# Patient Record
Sex: Female | Born: 1955
Health system: Southern US, Community
[De-identification: ages and names within clinical notes are randomized; demographics above are authoritative.]

## PROBLEM LIST (undated history)

## (undated) DIAGNOSIS — F419 Anxiety disorder, unspecified: Secondary | ICD-10-CM

## (undated) DIAGNOSIS — G4733 Obstructive sleep apnea (adult) (pediatric): Secondary | ICD-10-CM

## (undated) DIAGNOSIS — K760 Fatty (change of) liver, not elsewhere classified: Secondary | ICD-10-CM

## (undated) DIAGNOSIS — K219 Gastro-esophageal reflux disease without esophagitis: Secondary | ICD-10-CM

## (undated) DIAGNOSIS — J189 Pneumonia, unspecified organism: Secondary | ICD-10-CM

## (undated) DIAGNOSIS — E119 Type 2 diabetes mellitus without complications: Secondary | ICD-10-CM

## (undated) DIAGNOSIS — E669 Obesity, unspecified: Secondary | ICD-10-CM

## (undated) DIAGNOSIS — K589 Irritable bowel syndrome without diarrhea: Secondary | ICD-10-CM

## (undated) DIAGNOSIS — R0600 Dyspnea, unspecified: Secondary | ICD-10-CM

## (undated) DIAGNOSIS — M1990 Unspecified osteoarthritis, unspecified site: Secondary | ICD-10-CM

## (undated) DIAGNOSIS — I10 Essential (primary) hypertension: Secondary | ICD-10-CM

## (undated) DIAGNOSIS — G43909 Migraine, unspecified, not intractable, without status migrainosus: Secondary | ICD-10-CM

## (undated) HISTORY — PX: TONSILLECTOMY: SUR1361

## (undated) HISTORY — DX: Irritable bowel syndrome, unspecified: K58.9

## (undated) HISTORY — DX: Gastro-esophageal reflux disease without esophagitis: K21.9

## (undated) HISTORY — DX: Obesity, unspecified: E66.9

## (undated) HISTORY — DX: Migraine, unspecified, not intractable, without status migrainosus: G43.909

## (undated) HISTORY — PX: COLONOSCOPY: SHX174

## (undated) HISTORY — DX: Essential (primary) hypertension: I10

## (undated) HISTORY — DX: Type 2 diabetes mellitus without complications: E11.9

## (undated) HISTORY — DX: Obstructive sleep apnea (adult) (pediatric): G47.33

## (undated) HISTORY — DX: Fatty (change of) liver, not elsewhere classified: K76.0

---

## 1997-10-23 ENCOUNTER — Ambulatory Visit (HOSPITAL_COMMUNITY): Admission: RE | Admit: 1997-10-23 | Discharge: 1997-10-23 | Payer: Self-pay | Admitting: Internal Medicine

## 1998-04-18 ENCOUNTER — Emergency Department (HOSPITAL_COMMUNITY): Admission: EM | Admit: 1998-04-18 | Discharge: 1998-04-18 | Payer: Self-pay | Admitting: Emergency Medicine

## 1998-05-15 ENCOUNTER — Other Ambulatory Visit: Admission: RE | Admit: 1998-05-15 | Discharge: 1998-05-15 | Payer: Self-pay | Admitting: Obstetrics and Gynecology

## 1999-07-02 ENCOUNTER — Other Ambulatory Visit: Admission: RE | Admit: 1999-07-02 | Discharge: 1999-07-02 | Payer: Self-pay | Admitting: Obstetrics and Gynecology

## 2000-08-29 ENCOUNTER — Other Ambulatory Visit: Admission: RE | Admit: 2000-08-29 | Discharge: 2000-08-29 | Payer: Self-pay | Admitting: Obstetrics and Gynecology

## 2000-12-27 ENCOUNTER — Encounter: Admission: RE | Admit: 2000-12-27 | Discharge: 2000-12-27 | Payer: Self-pay | Admitting: Obstetrics and Gynecology

## 2000-12-27 ENCOUNTER — Encounter: Payer: Self-pay | Admitting: Obstetrics and Gynecology

## 2001-08-31 ENCOUNTER — Other Ambulatory Visit: Admission: RE | Admit: 2001-08-31 | Discharge: 2001-08-31 | Payer: Self-pay | Admitting: Obstetrics and Gynecology

## 2001-09-12 ENCOUNTER — Encounter: Admission: RE | Admit: 2001-09-12 | Discharge: 2001-09-12 | Payer: Self-pay | Admitting: Obstetrics and Gynecology

## 2001-09-12 ENCOUNTER — Encounter: Payer: Self-pay | Admitting: Obstetrics and Gynecology

## 2002-12-04 ENCOUNTER — Other Ambulatory Visit: Admission: RE | Admit: 2002-12-04 | Discharge: 2002-12-04 | Payer: Self-pay | Admitting: Obstetrics and Gynecology

## 2003-01-25 ENCOUNTER — Encounter: Admission: RE | Admit: 2003-01-25 | Discharge: 2003-01-25 | Payer: Self-pay | Admitting: Obstetrics and Gynecology

## 2003-02-08 ENCOUNTER — Encounter: Admission: RE | Admit: 2003-02-08 | Discharge: 2003-02-08 | Payer: Self-pay | Admitting: Obstetrics and Gynecology

## 2003-12-18 ENCOUNTER — Other Ambulatory Visit: Admission: RE | Admit: 2003-12-18 | Discharge: 2003-12-18 | Payer: Self-pay | Admitting: Obstetrics and Gynecology

## 2004-03-18 ENCOUNTER — Ambulatory Visit (HOSPITAL_COMMUNITY): Admission: RE | Admit: 2004-03-18 | Discharge: 2004-03-18 | Payer: Self-pay | Admitting: Gastroenterology

## 2004-03-18 ENCOUNTER — Encounter (INDEPENDENT_AMBULATORY_CARE_PROVIDER_SITE_OTHER): Payer: Self-pay | Admitting: Specialist

## 2004-04-08 ENCOUNTER — Encounter: Admission: RE | Admit: 2004-04-08 | Discharge: 2004-04-08 | Payer: Self-pay | Admitting: Obstetrics and Gynecology

## 2004-09-02 ENCOUNTER — Other Ambulatory Visit: Admission: RE | Admit: 2004-09-02 | Discharge: 2004-09-02 | Payer: Self-pay | Admitting: Obstetrics and Gynecology

## 2004-12-30 ENCOUNTER — Other Ambulatory Visit: Admission: RE | Admit: 2004-12-30 | Discharge: 2004-12-30 | Payer: Self-pay | Admitting: Obstetrics and Gynecology

## 2005-01-19 ENCOUNTER — Other Ambulatory Visit: Admission: RE | Admit: 2005-01-19 | Discharge: 2005-01-19 | Payer: Self-pay | Admitting: Obstetrics and Gynecology

## 2005-06-01 ENCOUNTER — Encounter: Admission: RE | Admit: 2005-06-01 | Discharge: 2005-06-01 | Payer: Self-pay | Admitting: Obstetrics and Gynecology

## 2005-06-23 ENCOUNTER — Other Ambulatory Visit: Admission: RE | Admit: 2005-06-23 | Discharge: 2005-06-23 | Payer: Self-pay | Admitting: Obstetrics and Gynecology

## 2006-01-06 ENCOUNTER — Other Ambulatory Visit: Admission: RE | Admit: 2006-01-06 | Discharge: 2006-01-06 | Payer: Self-pay | Admitting: Obstetrics and Gynecology

## 2006-06-27 ENCOUNTER — Encounter: Admission: RE | Admit: 2006-06-27 | Discharge: 2006-06-27 | Payer: Self-pay | Admitting: Obstetrics and Gynecology

## 2006-06-27 ENCOUNTER — Other Ambulatory Visit: Admission: RE | Admit: 2006-06-27 | Discharge: 2006-06-27 | Payer: Self-pay | Admitting: Obstetrics and Gynecology

## 2006-07-01 ENCOUNTER — Encounter: Admission: RE | Admit: 2006-07-01 | Discharge: 2006-07-01 | Payer: Self-pay | Admitting: Obstetrics and Gynecology

## 2006-10-19 ENCOUNTER — Encounter: Admission: RE | Admit: 2006-10-19 | Discharge: 2006-10-19 | Payer: Self-pay | Admitting: Obstetrics and Gynecology

## 2007-01-26 ENCOUNTER — Other Ambulatory Visit: Admission: RE | Admit: 2007-01-26 | Discharge: 2007-01-26 | Payer: Self-pay | Admitting: Obstetrics and Gynecology

## 2007-02-07 ENCOUNTER — Encounter: Admission: RE | Admit: 2007-02-07 | Discharge: 2007-02-07 | Payer: Self-pay | Admitting: Obstetrics and Gynecology

## 2007-07-18 ENCOUNTER — Other Ambulatory Visit: Admission: RE | Admit: 2007-07-18 | Discharge: 2007-07-18 | Payer: Self-pay | Admitting: Obstetrics and Gynecology

## 2007-09-25 ENCOUNTER — Encounter: Admission: RE | Admit: 2007-09-25 | Discharge: 2007-09-25 | Payer: Self-pay | Admitting: Obstetrics and Gynecology

## 2008-05-24 ENCOUNTER — Encounter: Admission: RE | Admit: 2008-05-24 | Discharge: 2008-05-24 | Payer: Self-pay | Admitting: Obstetrics and Gynecology

## 2008-08-28 ENCOUNTER — Other Ambulatory Visit: Admission: RE | Admit: 2008-08-28 | Discharge: 2008-08-28 | Payer: Self-pay | Admitting: Obstetrics and Gynecology

## 2008-11-07 ENCOUNTER — Encounter: Admission: RE | Admit: 2008-11-07 | Discharge: 2008-11-07 | Payer: Self-pay | Admitting: Obstetrics and Gynecology

## 2009-03-10 ENCOUNTER — Other Ambulatory Visit: Admission: RE | Admit: 2009-03-10 | Discharge: 2009-03-10 | Payer: Self-pay | Admitting: Obstetrics and Gynecology

## 2009-03-26 ENCOUNTER — Encounter: Admission: RE | Admit: 2009-03-26 | Discharge: 2009-03-26 | Payer: Self-pay | Admitting: Family Medicine

## 2009-09-18 ENCOUNTER — Other Ambulatory Visit: Admission: RE | Admit: 2009-09-18 | Discharge: 2009-09-18 | Payer: Self-pay | Admitting: Obstetrics and Gynecology

## 2009-11-13 ENCOUNTER — Encounter: Admission: RE | Admit: 2009-11-13 | Discharge: 2009-11-13 | Payer: Self-pay | Admitting: Obstetrics and Gynecology

## 2009-12-21 ENCOUNTER — Emergency Department (HOSPITAL_COMMUNITY): Admission: EM | Admit: 2009-12-21 | Discharge: 2009-12-21 | Payer: Self-pay | Admitting: Emergency Medicine

## 2010-03-10 ENCOUNTER — Other Ambulatory Visit
Admission: RE | Admit: 2010-03-10 | Discharge: 2010-03-10 | Payer: Self-pay | Source: Home / Self Care | Admitting: Obstetrics and Gynecology

## 2010-03-10 ENCOUNTER — Other Ambulatory Visit: Payer: Self-pay | Admitting: Nurse Practitioner

## 2010-03-15 ENCOUNTER — Encounter: Payer: Self-pay | Admitting: Obstetrics and Gynecology

## 2010-03-16 ENCOUNTER — Encounter: Payer: Self-pay | Admitting: Obstetrics and Gynecology

## 2010-05-06 LAB — URINALYSIS, ROUTINE W REFLEX MICROSCOPIC
Bilirubin Urine: NEGATIVE
Glucose, UA: NEGATIVE mg/dL
Hgb urine dipstick: NEGATIVE
Specific Gravity, Urine: 1.016 (ref 1.005–1.030)

## 2010-07-10 NOTE — Op Note (Signed)
NAMEHAYDYN, Rhonda Aguilar NO.:  [id number]   MEDICAL RECORD NO.:  [id number]          PATIENT TYPE:  AMB   LOCATION:  ENDO                         FACILITY:  [facility]   PHYSICIAN:  Anselmo Rod, M.D.  DATE OF BIRTH:  Jul 15, 1955   DATE OF PROCEDURE:  03/18/2004  DATE OF DISCHARGE:                                 OPERATIVE REPORT   PROCEDURE:  Colonoscopy with snare polypectomy x1, and cold biopsy x2.   ENDOSCOPIST:  Anselmo Rod, M.D.   INSTRUMENT:  Olympus videocolonoscope.   INDICATIONS FOR PROCEDURE:  A 55 year old white female with a family history  of colon cancer undergoing a screening colonoscopy to rule out colonic  polyps, masses, etcetera.   PREPROCEDURE PREPARATION:  Informed consent was procured from the patient.  The patient fasted for 8 hours prior to the procedure and prepped with a  bottle of magnesium citrate in a gallon of GoLYTELY the night prior to the  procedure.  The risks and benefits of the procedure including a 10% missed  rate was discussed with the patient prior to the procedure.  The patient was  advised to discontinue aspirin 5 days prior to the procedure as well, which  she did.   PREPROCEDURE PHYSICAL:  VITAL SIGNS: The patient with stable vital signs.  NECK:  Supple.  CHEST:  Clear to auscultation.  HEART:  S1, S2 regular.  ABDOMEN:  Soft with normal bowel sounds.   DESCRIPTION OF PROCEDURE:  The patient was placed in the left lateral  decubitus position and sedated with 80 mg of Demerol and 7 mg of Versed in  slow incremental doses.  Once the patient was adequately sedated and  maintained on low-flow oxygen, and continuous cardiac monitoring; the  Olympus videocolonoscope was advanced from the rectum to the cecum and  appendiceal orifice and ileocecal valve were able to be visualized after  multiple washes.  There was a significant amount of residual stool in the  right colon.  A small sessile polyp was snared from 35 cm.   A small erosion  was biopsied from the cecal base, the terminal ileum appeared normal.  There  was no evidence of diverticulosis.  Multiple washes were done throughout the  colon and small lesions could have been missed.  The patient tolerated the  procedure well without any complications.   Retroflexion in the rectum revealed no abnormalities.   IMPRESSION:  1.  Small sessile polyps snared from 35 cm.  2.  Small erosion biopsies from the cecal base.  3.  Normal terminal ileum.  4.  No evidence of diverticulosis.  5.  Some residual stool in the midcolon especially on the right side.  Very      small lesions could have been missed.   RECOMMENDATIONS:  1.  Await pathology results.  2.  Repeat colonoscopy, depending on pathology results.  3.  Avoid all nonsteroidals including aspirin for the next 4 weeks.  4.  Continue on a high-fiber diet with liberal fluid intake.  5.  Outpatient followup as need arises in the future.  JNM/MEDQ  D:  03/18/2004  T:  03/18/2004  Job:  [id number]   cc:   Darius Bump, M.D.  [place] [address]  [place] [address]  Fax: [phone]   Artist Pais, M.D.  [address], [address]  [place]  [place] [address]  Fax: [phone]

## 2010-09-22 ENCOUNTER — Other Ambulatory Visit: Payer: Self-pay | Admitting: Nurse Practitioner

## 2010-09-22 ENCOUNTER — Other Ambulatory Visit (HOSPITAL_COMMUNITY)
Admission: RE | Admit: 2010-09-22 | Discharge: 2010-09-22 | Disposition: A | Discharge status: Home / Self Care | Payer: BC Managed Care – PPO | Source: Ambulatory Visit | Attending: Obstetrics and Gynecology | Admitting: Obstetrics and Gynecology

## 2010-09-22 DIAGNOSIS — Z01419 Encounter for gynecological examination (general) (routine) without abnormal findings: Secondary | ICD-10-CM | POA: Insufficient documentation

## 2010-11-26 ENCOUNTER — Other Ambulatory Visit: Payer: Self-pay | Admitting: Obstetrics and Gynecology

## 2010-11-26 DIAGNOSIS — Z1231 Encounter for screening mammogram for malignant neoplasm of breast: Secondary | ICD-10-CM

## 2010-12-16 ENCOUNTER — Ambulatory Visit
Admission: RE | Admit: 2010-12-16 | Discharge: 2010-12-16 | Disposition: A | Discharge status: Home / Self Care | Payer: BC Managed Care – PPO | Source: Ambulatory Visit | Attending: Obstetrics and Gynecology | Admitting: Obstetrics and Gynecology

## 2010-12-16 DIAGNOSIS — Z1231 Encounter for screening mammogram for malignant neoplasm of breast: Secondary | ICD-10-CM

## 2012-02-11 ENCOUNTER — Other Ambulatory Visit: Payer: Self-pay | Admitting: Obstetrics and Gynecology

## 2012-02-11 DIAGNOSIS — Z1231 Encounter for screening mammogram for malignant neoplasm of breast: Secondary | ICD-10-CM

## 2012-02-23 HISTORY — PX: BREAST BIOPSY: SHX20

## 2012-03-20 ENCOUNTER — Ambulatory Visit: Payer: BC Managed Care – PPO

## 2012-04-18 ENCOUNTER — Other Ambulatory Visit: Payer: BC Managed Care – PPO

## 2012-04-18 ENCOUNTER — Ambulatory Visit
Admission: RE | Admit: 2012-04-18 | Discharge: 2012-04-18 | Disposition: A | Discharge status: Home / Self Care | Payer: 59 | Source: Ambulatory Visit | Attending: Gastroenterology | Admitting: Gastroenterology

## 2012-04-18 ENCOUNTER — Other Ambulatory Visit: Payer: Self-pay | Admitting: Gastroenterology

## 2012-04-18 DIAGNOSIS — R109 Unspecified abdominal pain: Secondary | ICD-10-CM

## 2012-05-15 ENCOUNTER — Ambulatory Visit
Admission: RE | Admit: 2012-05-15 | Discharge: 2012-05-15 | Disposition: A | Discharge status: Home / Self Care | Payer: 59 | Source: Ambulatory Visit | Attending: Obstetrics and Gynecology | Admitting: Obstetrics and Gynecology

## 2012-05-15 DIAGNOSIS — Z1231 Encounter for screening mammogram for malignant neoplasm of breast: Secondary | ICD-10-CM

## 2012-05-17 ENCOUNTER — Other Ambulatory Visit: Payer: Self-pay | Admitting: Obstetrics and Gynecology

## 2012-05-17 DIAGNOSIS — R928 Other abnormal and inconclusive findings on diagnostic imaging of breast: Secondary | ICD-10-CM

## 2012-05-29 ENCOUNTER — Ambulatory Visit
Admission: RE | Admit: 2012-05-29 | Discharge: 2012-05-29 | Disposition: A | Discharge status: Home / Self Care | Payer: 59 | Source: Ambulatory Visit | Attending: Obstetrics and Gynecology | Admitting: Obstetrics and Gynecology

## 2012-05-29 DIAGNOSIS — R928 Other abnormal and inconclusive findings on diagnostic imaging of breast: Secondary | ICD-10-CM

## 2012-08-31 ENCOUNTER — Other Ambulatory Visit (HOSPITAL_COMMUNITY): Payer: Self-pay | Admitting: Family Medicine

## 2012-08-31 ENCOUNTER — Encounter (HOSPITAL_COMMUNITY): Payer: Self-pay

## 2012-08-31 ENCOUNTER — Ambulatory Visit (HOSPITAL_COMMUNITY)
Admission: RE | Admit: 2012-08-31 | Discharge: 2012-08-31 | Disposition: A | Discharge status: Home / Self Care | Payer: 59 | Source: Ambulatory Visit | Attending: Family Medicine | Admitting: Family Medicine

## 2012-08-31 DIAGNOSIS — R0602 Shortness of breath: Secondary | ICD-10-CM

## 2012-08-31 DIAGNOSIS — J984 Other disorders of lung: Secondary | ICD-10-CM | POA: Insufficient documentation

## 2012-08-31 MED ORDER — SODIUM CHLORIDE 0.9 % IV SOLN
INTRAVENOUS | Status: AC
  Filled 2012-08-31: qty 100

## 2012-08-31 MED ORDER — IOHEXOL 350 MG/ML SOLN
100.0000 mL | Freq: Once | INTRAVENOUS | Status: AC | PRN
  Administered 2012-08-31: 100 mL via INTRAVENOUS

## 2012-12-22 ENCOUNTER — Other Ambulatory Visit: Payer: Self-pay | Admitting: Obstetrics and Gynecology

## 2012-12-22 DIAGNOSIS — R921 Mammographic calcification found on diagnostic imaging of breast: Secondary | ICD-10-CM

## 2013-01-11 ENCOUNTER — Ambulatory Visit
Admission: RE | Admit: 2013-01-11 | Discharge: 2013-01-11 | Disposition: A | Discharge status: Home / Self Care | Payer: 59 | Source: Ambulatory Visit | Attending: Obstetrics and Gynecology | Admitting: Obstetrics and Gynecology

## 2013-01-11 ENCOUNTER — Other Ambulatory Visit: Payer: Self-pay | Admitting: Obstetrics and Gynecology

## 2013-01-11 DIAGNOSIS — R921 Mammographic calcification found on diagnostic imaging of breast: Secondary | ICD-10-CM

## 2013-06-13 ENCOUNTER — Other Ambulatory Visit: Payer: Self-pay

## 2013-06-13 DIAGNOSIS — Z1231 Encounter for screening mammogram for malignant neoplasm of breast: Secondary | ICD-10-CM

## 2013-06-21 ENCOUNTER — Ambulatory Visit
Admission: RE | Admit: 2013-06-21 | Discharge: 2013-06-21 | Disposition: A | Discharge status: Home / Self Care | Payer: 59 | Source: Ambulatory Visit

## 2013-06-21 ENCOUNTER — Encounter (INDEPENDENT_AMBULATORY_CARE_PROVIDER_SITE_OTHER): Payer: Self-pay

## 2013-06-21 DIAGNOSIS — Z1231 Encounter for screening mammogram for malignant neoplasm of breast: Secondary | ICD-10-CM

## 2014-04-23 ENCOUNTER — Other Ambulatory Visit (HOSPITAL_COMMUNITY)
Admission: RE | Admit: 2014-04-23 | Discharge: 2014-04-23 | Disposition: A | Discharge status: Home / Self Care | Payer: 59 | Source: Ambulatory Visit | Attending: Nurse Practitioner | Admitting: Nurse Practitioner

## 2014-04-23 ENCOUNTER — Other Ambulatory Visit: Payer: Self-pay | Admitting: Nurse Practitioner

## 2014-04-23 DIAGNOSIS — Z1151 Encounter for screening for human papillomavirus (HPV): Secondary | ICD-10-CM | POA: Insufficient documentation

## 2014-04-23 DIAGNOSIS — Z01419 Encounter for gynecological examination (general) (routine) without abnormal findings: Secondary | ICD-10-CM | POA: Insufficient documentation

## 2014-04-24 LAB — CYTOLOGY - PAP

## 2014-08-20 ENCOUNTER — Other Ambulatory Visit: Payer: Self-pay

## 2014-08-20 DIAGNOSIS — Z1231 Encounter for screening mammogram for malignant neoplasm of breast: Secondary | ICD-10-CM

## 2014-09-05 ENCOUNTER — Telehealth: Payer: Self-pay | Admitting: Cardiology

## 2014-09-05 DIAGNOSIS — R0602 Shortness of breath: Secondary | ICD-10-CM

## 2014-09-05 NOTE — Addendum Note (Signed)
Addended by: Harland German A on: 09/05/2014 04:33 PM   Modules accepted: Orders

## 2014-09-05 NOTE — Telephone Encounter (Signed)
ECHO ordered for scheduling. Patient to review her schedule and let me know when to schedule OV.

## 2014-09-05 NOTE — Telephone Encounter (Signed)
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  Please set her up for 2D echo and then set her up for OV with me.  Please call her to set up a time that is convenient for her.

## 2014-09-10 ENCOUNTER — Ambulatory Visit (HOSPITAL_COMMUNITY): Payer: 59 | Attending: Cardiology

## 2014-09-10 ENCOUNTER — Other Ambulatory Visit: Payer: Self-pay

## 2014-09-10 DIAGNOSIS — I10 Essential (primary) hypertension: Secondary | ICD-10-CM | POA: Diagnosis not present

## 2014-09-10 DIAGNOSIS — Z6841 Body Mass Index (BMI) 40.0 and over, adult: Secondary | ICD-10-CM | POA: Diagnosis not present

## 2014-09-10 DIAGNOSIS — E669 Obesity, unspecified: Secondary | ICD-10-CM | POA: Insufficient documentation

## 2014-09-10 DIAGNOSIS — R0602 Shortness of breath: Secondary | ICD-10-CM | POA: Insufficient documentation

## 2014-09-10 NOTE — Telephone Encounter (Signed)
Patient agrees to OV 8/10 at 1100.

## 2014-09-11 ENCOUNTER — Telehealth: Payer: Self-pay

## 2014-09-11 DIAGNOSIS — R0602 Shortness of breath: Secondary | ICD-10-CM

## 2014-09-11 NOTE — Telephone Encounter (Signed)
-----   Message from Sueanne Margarita, MD sent at 09/11/2014  2:37 PM EDT ----- I have talked with [facility] and I would like to get a cardiopulmonary stress test to determine if SOB is exercised induced asthma, primary cardiac issue with history of diastolic dysfunction or due to deconditioning and obesity. Also we can rule out coronary ischemia.  Please make sure Dr. Haroldine Laws or Dr. Aundra Dubin read this.

## 2014-09-11 NOTE — Addendum Note (Signed)
Addended by: Harland German A on: 09/11/2014 06:22 PM   Modules accepted: Orders

## 2014-09-11 NOTE — Telephone Encounter (Signed)
CPX ordered for scheduling.

## 2014-10-01 ENCOUNTER — Ambulatory Visit: Payer: 59

## 2014-10-02 ENCOUNTER — Ambulatory Visit (HOSPITAL_COMMUNITY): Payer: 59 | Attending: Cardiology

## 2014-10-02 ENCOUNTER — Ambulatory Visit: Payer: 59 | Admitting: Cardiology

## 2014-10-02 DIAGNOSIS — R0602 Shortness of breath: Secondary | ICD-10-CM | POA: Insufficient documentation

## 2014-10-03 ENCOUNTER — Other Ambulatory Visit (HOSPITAL_COMMUNITY): Payer: Self-pay | Admitting: *Deleted

## 2014-10-03 DIAGNOSIS — R0602 Shortness of breath: Secondary | ICD-10-CM

## 2014-10-10 ENCOUNTER — Encounter: Payer: Self-pay | Admitting: Cardiovascular Disease

## 2014-10-15 ENCOUNTER — Ambulatory Visit
Admission: RE | Admit: 2014-10-15 | Discharge: 2014-10-15 | Disposition: A | Discharge status: Home / Self Care | Payer: 59 | Source: Ambulatory Visit

## 2014-10-15 DIAGNOSIS — Z1231 Encounter for screening mammogram for malignant neoplasm of breast: Secondary | ICD-10-CM

## 2014-11-13 ENCOUNTER — Ambulatory Visit: Payer: 59 | Admitting: Cardiology

## 2014-11-20 ENCOUNTER — Telehealth: Payer: Self-pay | Admitting: *Deleted

## 2014-11-20 NOTE — Telephone Encounter (Signed)
PT WAS IN CLINIC AND WAS UNABLE TO PROVIDE INFO AT THAT TIME. PT STATED SHE WILL CALL BACK WITH INFO.

## 2014-11-22 ENCOUNTER — Encounter: Payer: Self-pay | Admitting: Cardiology

## 2014-11-22 ENCOUNTER — Ambulatory Visit (INDEPENDENT_AMBULATORY_CARE_PROVIDER_SITE_OTHER): Payer: 59 | Admitting: Cardiology

## 2014-11-22 VITALS — HR 60 | Ht 62.5 in | Wt 219.4 lb

## 2014-11-22 DIAGNOSIS — G4733 Obstructive sleep apnea (adult) (pediatric): Secondary | ICD-10-CM | POA: Insufficient documentation

## 2014-11-22 DIAGNOSIS — R0600 Dyspnea, unspecified: Secondary | ICD-10-CM | POA: Insufficient documentation

## 2014-11-22 DIAGNOSIS — E669 Obesity, unspecified: Secondary | ICD-10-CM | POA: Diagnosis not present

## 2014-11-22 DIAGNOSIS — R0609 Other forms of dyspnea: Secondary | ICD-10-CM | POA: Diagnosis not present

## 2014-11-22 DIAGNOSIS — I10 Essential (primary) hypertension: Secondary | ICD-10-CM | POA: Diagnosis not present

## 2014-11-22 HISTORY — DX: Obesity, unspecified: E66.9

## 2014-11-22 HISTORY — DX: Essential (primary) hypertension: I10

## 2014-11-22 NOTE — Progress Notes (Signed)
Cardiology Office Note   Date:  11/22/2014   ID:  Rhonda Aguilar, DOB 1955/06/14, MRN [id number]  PCP:  Marjorie Smolder, MD    Chief Complaint  Patient presents with  . New Evaluation    review test results      History of Present Illness: Rhonda Aguilar is a 59 y.o. female who presents for evaluation of DOE.      Past Medical History  Diagnosis Date  . Hypertension   . Migraine     Past Surgical History  Procedure Laterality Date  . Tonsillectomy       Current Outpatient Prescriptions  Medication Sig Dispense Refill  . busPIRone (BUSPAR) 5 MG tablet Take 5 mg by mouth 2 (two) times daily.    [name] dexlansoprazole (DEXILANT) 60 MG capsule Take 60 mg by mouth daily.    . hydrochlorothiazide (MICROZIDE) 12.5 MG capsule Take 12.5 mg by mouth daily. Pt takes Mon, [place] and Fri.  1  . ibuprofen (ADVIL,MOTRIN) 600 MG tablet Take 600 mg by mouth every 6 (six) hours as needed for mild pain.    [name] metoCLOPramide (REGLAN) 10 MG tablet Take 10 mg by mouth as needed for nausea. migraines    . valsartan (DIOVAN) 320 MG tablet Take 320 mg by mouth daily.    . verapamil (VERELAN PM) 240 MG 24 hr capsule Take 240 mg by mouth at bedtime.     No current facility-administered medications for this visit.    Allergies:   Codeine and Penicillins    Social History:  The patient  reports that she has never smoked. She does not have any smokeless tobacco history on file. She reports that she drinks alcohol.   Family History:  The patient's family history includes Coronary artery disease in her father; Diabetes in her mother; Heart disease in her mother; Hypertension in her brother and mother.    ROS:  Please see the history of present illness.   Otherwise, review of systems are positive for none.   All other systems are reviewed and negative.    PHYSICAL EXAM: VS:  Ht 5' 2.5" (1.588 m)  Wt 219 lb 6.4 oz (99.519 kg)  BMI 39.46 kg/m2 , BMI Body mass index is 39.46  kg/(m^2). GEN: Well nourished, well developed, in no acute distress HEENT: normal Neck: no JVD, carotid bruits, or masses Cardiac: RRR; no murmurs, rubs, or gallops,no edema  Respiratory:  clear to auscultation bilaterally, normal work of breathing GI: soft, nontender, nondistended, + BS MS: no deformity or atrophy Skin: warm and dry, no rash Neuro:  Strength and sensation are intact Psych: euthymic mood, full affect   EKG:  EKG is ordered today. The ekg ordered today demonstrates NSR at 60bpm with nonspecific ST abnormality   Recent Labs: No results found for requested labs within last 365 days.    Lipid Panel No results found for: CHOL, TRIG, HDL, CHOLHDL, VLDL, LDLCALC, LDLDIRECT    Wt Readings from Last 3 Encounters:  11/22/14 219 lb 6.4 oz (99.519 kg)        ASSESSMENT AND PLAN:  1.  DOE secondary to morbid obesity and deconditioning.  2D echo showed normal LVF with diastolic dysfunction. Cardiopulmonary xercise testing with gas-exchange demonstrated normal functional capacity when compared to matched sedentary norms. There was not a ventilatory or circulatory limitation. Post-exercise spirometry did not suggest exercise-induced asthma. The ideal-weight adjustment is much  higher than predicted, which suggested the patient's body habitus i smost likely source of her perceived exercise ntolerance.  2.  ? Remote history of PFO by transcranial dopplers but prior 2D echo with saline contrast was negative for PFO.  Transcranial dopplers had been done for migraine HA. 3.  OSA on CPAP but awakening gasping for breath.  She will drop off her SD card to make sure her current CPAP setting is adequate to control her OSA. 4.  Morbid obesity - this is contributing to her DOE as well as fatty liver with elevated LFTs and joint problems.  We had a long discussion about routine exercise program and diet.  She is contemplating gastric sleeve surgery and is going to an information meeting.     5.  Dyslipidemia - recent NMR with normal LDL but elevated small LDL particles.  She will drop off a copy of the labs for my review.  We discussed possible coronary calcium score given her family history of premature CAD for risk stratification in light of her dyslipidemia but she wants to hold off and try diet.  She has had problems with muscle aches on statins in the past.   Current medicines are reviewed at length with the patient today.  The patient does not have concerns regarding medicines.  The following changes have been made:  no change  Labs/ tests ordered today: See above Assessment and Plan No orders of the defined types were placed in this encounter.     Disposition:   FU with me in PRN  Signed, Sueanne Margarita, MD  11/22/2014 8:42 AM    [facility] [place], [place], [place]  [address] Phone: [phone]; Fax: [phone]

## 2014-11-22 NOTE — Patient Instructions (Signed)
Medication Instructions:  Your physician recommends that you continue on your current medications as directed. Please refer to the Current Medication list given to you today.   Labwork: None  Testing/Procedures: None  Follow-Up: Your physician recommends that you schedule a follow-up appointment AS NEEDED with Dr. Turner.  Any Other Special Instructions Will Be Listed Below (If Applicable).   

## 2014-12-19 ENCOUNTER — Other Ambulatory Visit (HOSPITAL_COMMUNITY)
Admission: RE | Admit: 2014-12-19 | Discharge: 2014-12-19 | Disposition: A | Discharge status: Home / Self Care | Payer: 59 | Source: Ambulatory Visit | Attending: Gastroenterology | Admitting: Gastroenterology

## 2014-12-19 DIAGNOSIS — Z029 Encounter for administrative examinations, unspecified: Secondary | ICD-10-CM | POA: Insufficient documentation

## 2014-12-19 LAB — CBC WITH DIFFERENTIAL/PLATELET
Basophils Absolute: 0.1 10*3/uL (ref 0.0–0.1)
Basophils Relative: 0 %
EOS ABS: 0.2 10*3/uL (ref 0.0–0.7)
EOS PCT: 1 %
HCT: 40.9 % (ref 36.0–46.0)
HEMOGLOBIN: 13.7 g/dL (ref 12.0–15.0)
LYMPHS ABS: 4.2 10*3/uL — AB (ref 0.7–4.0)
Lymphocytes Relative: 26 %
MCH: 27.8 pg (ref 26.0–34.0)
MCHC: 33.5 g/dL (ref 30.0–36.0)
MCV: 83.1 fL (ref 78.0–100.0)
MONOS PCT: 9 %
Monocytes Absolute: 1.5 10*3/uL — ABNORMAL HIGH (ref 0.1–1.0)
NEUTROS PCT: 64 %
Neutro Abs: 10.3 10*3/uL — ABNORMAL HIGH (ref 1.7–7.7)
Platelets: 263 10*3/uL (ref 150–400)
RBC: 4.92 MIL/uL (ref 3.87–5.11)
RDW: 15 % (ref 11.5–15.5)
WBC: 16.3 10*3/uL — ABNORMAL HIGH (ref 4.0–10.5)

## 2015-01-21 ENCOUNTER — Encounter: Payer: Self-pay | Admitting: Cardiology

## 2015-02-28 MED FILL — VALSARTAN 320 MG TABLET: 320 | 90 days supply | Qty: 90 | Fill #0

## 2015-03-05 DIAGNOSIS — F339 Major depressive disorder, recurrent, unspecified: Secondary | ICD-10-CM | POA: Diagnosis not present

## 2015-03-20 DIAGNOSIS — Z6841 Body Mass Index (BMI) 40.0 and over, adult: Secondary | ICD-10-CM | POA: Diagnosis not present

## 2015-03-20 DIAGNOSIS — I10 Essential (primary) hypertension: Secondary | ICD-10-CM | POA: Diagnosis not present

## 2015-04-04 MED FILL — PRISTIQ ER 50 MG TABLET: 50 | 30 days supply | Qty: 30 | Fill #0

## 2015-04-17 DIAGNOSIS — F339 Major depressive disorder, recurrent, unspecified: Secondary | ICD-10-CM | POA: Diagnosis not present

## 2015-04-30 MED FILL — DESVENLAFAXINE SUC ER 50 MG: 50 | 90 days supply | Qty: 90 | Fill #0

## 2015-04-30 MED FILL — DEXILANT DR 60 MG CAPSULE: 60 | 90 days supply | Qty: 90 | Fill #1

## 2015-05-16 MED FILL — VERAPAMIL ER 240 MG TABLET: 240 | 90 days supply | Qty: 90 | Fill #0

## 2015-06-10 DIAGNOSIS — F339 Major depressive disorder, recurrent, unspecified: Secondary | ICD-10-CM | POA: Diagnosis not present

## 2015-06-13 ENCOUNTER — Other Ambulatory Visit: Payer: Self-pay

## 2015-06-17 ENCOUNTER — Ambulatory Visit: Payer: 59 | Admitting: Internal Medicine

## 2015-06-19 DIAGNOSIS — I10 Essential (primary) hypertension: Secondary | ICD-10-CM | POA: Diagnosis not present

## 2015-06-19 DIAGNOSIS — R7309 Other abnormal glucose: Secondary | ICD-10-CM | POA: Diagnosis not present

## 2015-06-19 DIAGNOSIS — E669 Obesity, unspecified: Secondary | ICD-10-CM | POA: Diagnosis not present

## 2015-06-19 DIAGNOSIS — E782 Mixed hyperlipidemia: Secondary | ICD-10-CM | POA: Diagnosis not present

## 2015-06-19 MED FILL — VALSARTAN 160 MG TABLET: 160 | 90 days supply | Qty: 90 | Fill #0

## 2015-07-03 MED FILL — busPIRone HCL 10 MG TABS: 10 | 90 days supply | Qty: 90 | Fill #1

## 2015-07-30 DIAGNOSIS — H524 Presbyopia: Secondary | ICD-10-CM | POA: Diagnosis not present

## 2015-07-30 DIAGNOSIS — H5213 Myopia, bilateral: Secondary | ICD-10-CM | POA: Diagnosis not present

## 2015-07-30 MED FILL — DESVENLAFAXINE ER 50 MG TAB: 50 | 90 days supply | Qty: 90 | Fill #0

## 2015-07-30 MED FILL — DEXILANT DR 60 MG CAPSULE: 60 | 90 days supply | Qty: 90 | Fill #2

## 2015-08-18 MED FILL — VERAPAMIL ER 240 MG TABLET: 240 | 90 days supply | Qty: 90 | Fill #0

## 2015-09-10 DIAGNOSIS — F339 Major depressive disorder, recurrent, unspecified: Secondary | ICD-10-CM | POA: Diagnosis not present

## 2015-09-10 MED FILL — DESVENLAFAXINE SUC ER 25 MG: 25 | 26 days supply | Qty: 20 | Fill #0

## 2015-09-17 ENCOUNTER — Telehealth: Payer: Self-pay | Admitting: Neurology

## 2015-09-17 NOTE — Telephone Encounter (Signed)
Patient called to say it had been awhile since she was in to see the doctor but wants to know if she needs to bring her chip in.  She wants a call back tomorrow because she will be off.  [phone]

## 2015-09-17 NOTE — Telephone Encounter (Signed)
I returned pt's call. Pt has not been seen here in at least 3 years and therefore needs a referral back in to be scheduled. Pt should bring her chip with her to her appt.  No answer, left a message asking her to call me back.  If pt calls back, please advise her of this information. She needs a referral to be scheduled at [facility].

## 2015-09-18 DIAGNOSIS — G4733 Obstructive sleep apnea (adult) (pediatric): Secondary | ICD-10-CM | POA: Diagnosis not present

## 2015-10-31 DIAGNOSIS — G4733 Obstructive sleep apnea (adult) (pediatric): Secondary | ICD-10-CM | POA: Diagnosis not present

## 2015-11-05 DIAGNOSIS — I10 Essential (primary) hypertension: Secondary | ICD-10-CM | POA: Diagnosis not present

## 2015-11-05 DIAGNOSIS — M545 Low back pain: Secondary | ICD-10-CM | POA: Diagnosis not present

## 2015-11-06 MED FILL — CYCLOBENZAPRINE 10 MG TAB: 10 | 10 days supply | Qty: 30 | Fill #0

## 2015-11-13 DIAGNOSIS — F339 Major depressive disorder, recurrent, unspecified: Secondary | ICD-10-CM | POA: Diagnosis not present

## 2015-11-13 MED FILL — VERAPAMIL ER 240 MG TABLET: 240 | 90 days supply | Qty: 90 | Fill #0

## 2015-11-13 MED FILL — DESVENLAFAXINE SUC ER 25 MG: 25 | 30 days supply | Qty: 30 | Fill #0

## 2015-11-13 MED FILL — DEXILANT DR 60 MG CAPSULE: 60 | 90 days supply | Qty: 90 | Fill #3

## 2015-11-18 ENCOUNTER — Ambulatory Visit: Payer: 59 | Attending: Family Medicine | Admitting: Physical Therapy

## 2015-11-18 ENCOUNTER — Encounter: Payer: Self-pay | Admitting: Physical Therapy

## 2015-11-18 DIAGNOSIS — M545 Low back pain, unspecified: Secondary | ICD-10-CM

## 2015-11-18 DIAGNOSIS — M6281 Muscle weakness (generalized): Secondary | ICD-10-CM

## 2015-11-18 NOTE — Therapy (Signed)
[place], [place], [address] Phone: [phone]   Fax:  [phone]  Physical Therapy Treatment  Patient Details  Name: Rhonda Aguilar MRN: [id number] Date of Birth: Aug 01, 1955 Referring Provider: Darcus Austin, MD  Encounter Date: 11/18/2015      PT End of Session - 11/18/15 0803    Visit Number 1   Number of Visits 9   Date for PT Re-Evaluation 12/19/15   Authorization Type [facility]   PT Start Time 0802   PT Stop Time 0858   PT Time Calculation (min) 56 min   Activity Tolerance Patient tolerated treatment well   Behavior During Therapy [facility] for tasks assessed/performed      Past Medical History:  Diagnosis Date  . Benign essential HTN 11/22/2014  . Hypertension   . Migraine   . Obesity (BMI 30-39.9) 11/22/2014  . OSA (obstructive sleep apnea)     Past Surgical History:  Procedure Laterality Date  . TONSILLECTOMY      There were no vitals filed for this visit.      Subjective Assessment - 11/18/15 0806    Subjective Began approx Sept 5, Got up to go to work and felt LBP L >R. Felt tight and had spasms while trying to take warm bath. 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 Has been taking flexoril which helped but made her sleepy. Stopped taking because she was feeling better and used heat & ibuprofen. Began feeling pain again yesterday morning and today. 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    How long can you sit comfortably? unlimited   How long can you stand comfortably? 10-15 min   Patient Stated Goals decrease pain, evaluate body mechanics, cat litter 20-25 lb, lift case of water, prevent further problems   Currently in Pain? Yes   Pain Score 2    Pain Location Back   Pain Orientation Lower;Left;Right  L>R   Pain Descriptors / Indicators Aching   Pain Type Chronic pain   Pain Frequency Intermittent   Aggravating Factors  standing/walking   Pain Relieving Factors heat,  medications            [facility] PT Assessment - 11/18/15 0001      Assessment   Medical Diagnosis LBP without sciatica   Referring Provider Darcus Austin, MD   Hand Dominance Right   Next MD Visit Oct 18   Prior Therapy no     Precautions   Precautions None     Restrictions   Weight Bearing Restrictions No     Balance Screen   Has the patient fallen in the past 6 months No     [place] residence   Additional Comments 11 deck steps to enter house     Prior Function   Level of Independence Independent     Cognition   Overall Cognitive Status Within Functional Limits for tasks assessed     Observation/Other Assessments   Focus on Therapeutic Outcomes (FOTO)  61% ability     ROM / Strength   AROM / PROM / Strength AROM;Strength     AROM   AROM Assessment Site Lumbar   Lumbar Flexion 70   Lumbar Extension 10     Strength   Strength Assessment Site Hip   Right/Left Hip Right;Left   Left Hip Extension 4/5   Left Hip ABduction 4-/5     Palpation  Palpation comment TTP QL bilaterally L>R                     [facility] Adult PT Treatment/Exercise - 11/18/15 0001      Exercises   Exercises Lumbar     Lumbar Exercises: Stretches   Single Knee to Chest Stretch 2 reps;10 seconds     Lumbar Exercises: Supine   Ab Set 20 reps     Knee/Hip Exercises: Stretches   Other Knee/Hip Stretches figure 4     Knee/Hip Exercises: Sidelying   Clams x20     Modalities   Modalities Electrical Stimulation;Moist Heat     Moist Heat Therapy   Number Minutes Moist Heat 15 Minutes  concurrent with ESTIM   Moist Heat Location Lumbar Spine     Electrical Stimulation   Electrical Stimulation Location lumbar   Electrical Stimulation Action IFC   Electrical Stimulation Parameters 15' concurrent with heat   Electrical Stimulation Goals Pain                PT Education - 11/18/15 1305    Education provided Yes    Education Details anatomy of condition, POC, HEP, exercise form/rationale   Person(s) Educated Patient   Methods Explanation;Demonstration;Tactile cues;Verbal cues;Handout   Comprehension Verbalized understanding;Returned demonstration;Verbal cues required;Tactile cues required;Need further instruction          PT Short Term Goals - 11/18/15 1309      PT SHORT TERM GOAL #1   Title Pt will be able to carry cat litter without LBP in order to complete household chores by 10/27   Baseline pain at eval   Time 4   Period Weeks   Status New     PT SHORT TERM GOAL #2   Title Pt will demonstrate proper body mechanics while sitting and standing and verbalize ability to recruit appropriate musculature to decrease LBP   Baseline began educating at eval   Time 4   Period Weeks   Status New     PT SHORT TERM GOAL #3   Title Pt will be able to stand for at least 30 minutes before noticing any back pain   Baseline 10 min at eval   Time 4   Period Weeks   Status New     PT SHORT TERM GOAL #4   Title FOTO to 70% ability to indicate significant functional improvement   Baseline 61% ability at eval   Time 4   Period Weeks   Status New                  Plan - 11/18/15 1306    Clinical Impression Statement Pt presents to PT with complaints of LBP that began about a month ago, increases with long term sitting or standing and is improved with movement. 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 Tightness noted in bilateral QL as well as poor coordinated control of abdominal musculature. Pt will benefit from skilled PT in order to decrease muscular spasm and guarding as well as strengthening for lumbo pelvic musculature to provide support to low back.    Rehab Potential Good   PT Frequency 2x / week   PT Duration 4 weeks   PT Treatment/Interventions ADLs/Self Care Home  Management;Cryotherapy;Electrical Stimulation;Iontophoresis 4mg /ml Dexamethasone;Stair training;Gait training;Ultrasound;Traction;Moist Heat;Therapeutic activities;Therapeutic exercise;Balance training;Neuromuscular re-education;Patient/family education;Passive range of motion;Manual techniques;Dry needling;Taping   PT Next Visit  Plan core training, hip abd strengthening, reformer   PT Home Exercise Plan pelvic tilt/abd recruitment in all positions, clams, SKTC, long stretch from sink   Consulted and Agree with Plan of Care Patient      Patient will benefit from skilled therapeutic intervention in order to improve the following deficits and impairments:  Difficulty walking, Increased muscle spasms, Decreased activity tolerance, Pain, Decreased strength  Visit Diagnosis: Bilateral low back pain without sciatica - Plan: PT plan of care cert/re-cert  Muscle weakness (generalized) - Plan: PT plan of care cert/re-cert     Problem List Patient Active Problem List   Diagnosis Date Noted  . DOE (dyspnea on exertion) 11/22/2014  . Obesity (BMI 30-39.9) 11/22/2014  . Benign essential HTN 11/22/2014  . OSA (obstructive sleep apnea)     Juanell Saffo C. Zyanna Leisinger PT, DPT 11/18/15 1:16 PM   [facility] [address] [place], [place], [address] Phone: [phone]   Fax:  [phone]  Name: Rhonda Aguilar MRN: [id number] Date of Birth: 04-23-1955

## 2015-11-26 ENCOUNTER — Ambulatory Visit: Payer: 59 | Attending: Family Medicine | Admitting: Physical Therapy

## 2015-11-26 ENCOUNTER — Encounter: Payer: Self-pay | Admitting: Physical Therapy

## 2015-11-26 DIAGNOSIS — M6281 Muscle weakness (generalized): Secondary | ICD-10-CM | POA: Insufficient documentation

## 2015-11-26 DIAGNOSIS — M545 Low back pain, unspecified: Secondary | ICD-10-CM

## 2015-11-26 DIAGNOSIS — G8929 Other chronic pain: Secondary | ICD-10-CM | POA: Insufficient documentation

## 2015-11-26 DIAGNOSIS — F341 Dysthymic disorder: Secondary | ICD-10-CM | POA: Diagnosis not present

## 2015-11-26 NOTE — Therapy (Signed)
[place], [place], [address] Phone: [phone]   Fax:  [phone]  Physical Therapy Treatment  Patient Details  Name: Rhonda Aguilar MRN: [id number] Date of Birth: 1955-04-29 Referring Provider: Darcus Austin, MD  Encounter Date: 11/26/2015      PT End of Session - 11/26/15 0944    Visit Number 2   Number of Visits 9   Date for PT Re-Evaluation 12/19/15   Authorization Type [facility]   PT Start Time 0940  pt arrived late   PT Stop Time 1025   PT Time Calculation (min) 45 min   Activity Tolerance Patient tolerated treatment well   Behavior During Therapy [facility] for tasks assessed/performed      Past Medical History:  Diagnosis Date  . Benign essential HTN 11/22/2014  . Hypertension   . Migraine   . Obesity (BMI 30-39.9) 11/22/2014  . OSA (obstructive sleep apnea)     Past Surgical History:  Procedure Laterality Date  . TONSILLECTOMY      There were no vitals filed for this visit.      Subjective Assessment - 11/26/15 0942    Subjective R side is feeling better but is now feeling discomfort on L. Flew since last visit and was pulling luggage and did not sleep on a very supportive bath.    Patient Stated Goals decrease pain, evaluate body mechanics, cat litter 20-25 lb, lift case of water, prevent further problems   Currently in Pain? Yes   Pain Score 2    Pain Location Back   Pain Orientation Lower;Right;Left   Pain Descriptors / Indicators Aching   Aggravating Factors  standing still, sitting                         [facility] Adult PT Treatment/Exercise - 11/26/15 0001      Lumbar Exercises: Stretches   Single Knee to Chest Stretch 5 reps;10 seconds  both   Lower Trunk Rotation 5 reps;10 seconds  both     Lumbar Exercises: Standing   Row 20 reps   Theraband Level (Row) Level 2 (Red)   Row Limitations cues for postural control    Other Standing Lumbar Exercises step  reach with pelvic tilt cues     Lumbar Exercises: Supine   AB Set Limitations posterior pelvic tilt- supine, seated, standing   Bent Knee Raise 20 reps  alt LE   Bent Knee Raise Limitations cues for pelvic tilt   Bridge 20 reps   Bridge Limitations with ball squeeze, cues for pelvic tilt   Other Supine Lumbar Exercises hooklying pelvic tilt + GHJ flx ball squeeze     Moist Heat Therapy   Number Minutes Moist Heat 10 Minutes   Moist Heat Location Lumbar Spine                PT Education - 11/26/15 0944    Education provided Yes   Education Details exercise form/rationale   Person(s) Educated Patient   Methods Explanation;Demonstration;Tactile cues;Verbal cues   Comprehension Verbalized understanding;Returned demonstration;Verbal cues required;Tactile cues required;Need further instruction          PT Short Term Goals - 11/18/15 1309      PT SHORT TERM GOAL #1   Title Pt will be able to carry cat litter without LBP in order to complete household chores by 10/27   Baseline pain at eval   Time 4   Period Weeks  Status New     PT SHORT TERM GOAL #2   Title Pt will demonstrate proper body mechanics while sitting and standing and verbalize ability to recruit appropriate musculature to decrease LBP   Baseline began educating at eval   Time 4   Period Weeks   Status New     PT SHORT TERM GOAL #3   Title Pt will be able to stand for at least 30 minutes before noticing any back pain   Baseline 10 min at eval   Time 4   Period Weeks   Status New     PT SHORT TERM GOAL #4   Title FOTO to 70% ability to indicate significant functional improvement   Baseline 61% ability at eval   Time 4   Period Weeks   Status New                  Plan - 11/26/15 1028    Clinical Impression Statement Focus today on activation of core musculature to reduce extension posture. Pt reported feeling like she was falling forward in upright posture due to long term habitual  extension. Pt will continue to benefit from skilled PT for abdominal strengthening and postural control.    PT Next Visit Plan core training, hip abd strengthening, reformer   PT Home Exercise Plan pelvic tilt/abd recruitment in all positions, clams, SKTC, long stretch from sink; rows with pelvic tilt    Consulted and Agree with Plan of Care Patient      Patient will benefit from skilled therapeutic intervention in order to improve the following deficits and impairments:     Visit Diagnosis: Chronic bilateral low back pain without sciatica  Muscle weakness (generalized)     Problem List Patient Active Problem List   Diagnosis Date Noted  . DOE (dyspnea on exertion) 11/22/2014  . Obesity (BMI 30-39.9) 11/22/2014  . Benign essential HTN 11/22/2014  . OSA (obstructive sleep apnea)     Monee Dembeck C. Dian Minahan PT, DPT 11/26/15 10:31 AM   [facility] [address] [place], [place], [address] Phone: [phone]   Fax:  [phone]  Name: Rhonda Aguilar MRN: [id number] Date of Birth: 1955-10-04

## 2015-11-28 ENCOUNTER — Ambulatory Visit: Payer: 59 | Admitting: Physical Therapy

## 2015-11-28 DIAGNOSIS — M6281 Muscle weakness (generalized): Secondary | ICD-10-CM | POA: Diagnosis not present

## 2015-11-28 DIAGNOSIS — G8929 Other chronic pain: Secondary | ICD-10-CM

## 2015-11-28 DIAGNOSIS — M545 Low back pain: Principal | ICD-10-CM

## 2015-11-28 NOTE — Therapy (Signed)
[place], [place], [address] Phone: [phone]   Fax:  [phone]  Physical Therapy Treatment  Patient Details  Name: Rhonda Aguilar MRN: [id number] Date of Birth: 25-Jul-1955 Referring Provider: Darcus Austin, MD  Encounter Date: 11/28/2015      PT End of Session - 11/28/15 1112    Visit Number 3   Number of Visits 9   Date for PT Re-Evaluation 12/19/15   Authorization Type [facility] UMR   PT Start Time 1105   Activity Tolerance Patient tolerated treatment well   Behavior During Therapy [facility] for tasks assessed/performed      Past Medical History:  Diagnosis Date  . Benign essential HTN 11/22/2014  . Hypertension   . Migraine   . Obesity (BMI 30-39.9) 11/22/2014  . OSA (obstructive sleep apnea)     Past Surgical History:  Procedure Laterality Date  . TONSILLECTOMY      There were no vitals filed for this visit.      Subjective Assessment - 11/28/15 1109    Subjective Was tense last night at work, pain today is min, just feels tight.     Currently in Pain? Yes   Pain Score 2    Pain Location Back   Pain Orientation Lower   Pain Descriptors / Indicators Tightness   Pain Type Chronic pain            [facility] Adult PT Treatment/Exercise - 11/28/15 0001      Lumbar Exercises: Machines for Strengthening   Other Lumbar Machine Exercise Pilates Reformer      Pilates Reformer used for LE/core strength, postural strength, lumbopelvic disassociation and core control.  Exercises included: Footwork 2 red 1 blue various positions to warm up and check alignment.  Bridging 2 Red 1 Blue x 10 reps  Supine Arm work 1 Red Arm arcs Parallel and T  Feet in Straps 1 Red 1 yellow Arcs parallel and turnout x 8-10 reps each  Frog Squats 1 Red 1 yellow x 10   single knee to chest each side x 2, 30 sec   MHP 5 min post      PT Short Term Goals - 11/28/15 1115      PT SHORT TERM GOAL #1   Title Pt will be  able to carry cat litter without LBP in order to complete household chores by 10/27   Status On-going     PT SHORT TERM GOAL #2   Title Pt will demonstrate proper body mechanics while sitting and standing and verbalize ability to recruit appropriate musculature to decrease LBP   Status On-going     PT SHORT TERM GOAL #3   Title Pt will be able to stand for at least 30 minutes before noticing any back pain   Status On-going     PT SHORT TERM GOAL #4   Title FOTO to 70% ability to indicate significant functional improvement   Status On-going                  Plan - 11/28/15 1141    Clinical Impression Statement Able to perform Pilates Reformer basics with good awareness of neutral, core activation and no increase in pain.     PT Next Visit Plan core training, hip abd strengthening, reformer   PT Home Exercise Plan pelvic tilt/abd recruitment in all positions, clams, SKTC, long stretch from sink; rows with pelvic tilt    Consulted and Agree with Plan of Care  Patient      Patient will benefit from skilled therapeutic intervention in order to improve the following deficits and impairments:  Difficulty walking, Increased muscle spasms, Decreased activity tolerance, Pain, Decreased strength  Visit Diagnosis: Chronic bilateral low back pain without sciatica  Muscle weakness (generalized)     Problem List Patient Active Problem List   Diagnosis Date Noted  . DOE (dyspnea on exertion) 11/22/2014  . Obesity (BMI 30-39.9) 11/22/2014  . Benign essential HTN 11/22/2014  . OSA (obstructive sleep apnea)     Rhonda Aguilar 11/28/2015, 11:42 AM  [place], [place], [address] Phone: [phone]   Fax:  [phone]  Name: Rhonda Aguilar MRN: [id number] Date of Birth: 31-Aug-1955   Raeford Razor, PT 11/28/15 11:42 AM Phone: [phone] Fax: [phone]

## 2015-12-01 ENCOUNTER — Ambulatory Visit: Payer: 59 | Admitting: Physical Therapy

## 2015-12-01 DIAGNOSIS — M6281 Muscle weakness (generalized): Secondary | ICD-10-CM

## 2015-12-01 DIAGNOSIS — M545 Low back pain, unspecified: Secondary | ICD-10-CM

## 2015-12-01 DIAGNOSIS — G8929 Other chronic pain: Secondary | ICD-10-CM | POA: Diagnosis not present

## 2015-12-01 NOTE — Therapy (Signed)
[place], [place], [address] Phone: [phone]   Fax:  [phone]  Physical Therapy Treatment  Patient Details  Name: Rhonda Aguilar MRN: [id number] Date of Birth: [birth year] Referring Provider: Darcus Austin, MD  Encounter Date: 12/01/2015      PT End of Session - 12/01/15 0953    Visit Number 4   Number of Visits 9   Date for PT Re-Evaluation 12/19/15   Authorization Type [facility] UMR   PT Start Time 0935   PT Stop Time 1030   PT Time Calculation (min) 55 min   Activity Tolerance Patient tolerated treatment well   Behavior During Therapy [facility] for tasks assessed/performed      Past Medical History:  Diagnosis Date  . Benign essential HTN 11/22/2014  . Hypertension   . Migraine   . Obesity (BMI 30-39.9) 11/22/2014  . OSA (obstructive sleep apnea)     Past Surgical History:  Procedure Laterality Date  . TONSILLECTOMY      There were no vitals filed for this visit.      Subjective Assessment - 12/01/15 0936    Subjective Was a little sore in my belly Sat after Friday's session.  Not really hurting now, worked all weekend.    Currently in Pain? No/denies            [facility] Adult PT Treatment/Exercise - 12/01/15 0939      Lumbar Exercises: Supine   AB Set Limitations posterior pelvic tilt- supine, seated, standing   Clam 20 reps   Clam Limitations used ball under pelvis to challenge stability    Heel Slides 20 reps   Bent Knee Raise 20 reps   Bent Knee Raise Limitations used ball under pelvis again    Bridge 10 reps     Moist Heat Therapy   Number Minutes Moist Heat 15 Minutes   Moist Heat Location Lumbar Spine       Pilates Reformer used for LE/core strength, postural strength, lumbopelvic disassociation and core control.  Exercises included: Feet in Straps 1 Red 1 Yellow arcs in neutral, ER and IR.  Quadruped 1 Red UE and LE x 10 each  Mermaid for stretching 1 Red lateral flexion  and added rotation, extension         PT Short Term Goals - 11/28/15 1115      PT SHORT TERM GOAL #1   Title Pt will be able to carry cat litter without LBP in order to complete household chores by 10/27   Status On-going     PT SHORT TERM GOAL #2   Title Pt will demonstrate proper body mechanics while sitting and standing and verbalize ability to recruit appropriate musculature to decrease LBP   Status On-going     PT SHORT TERM GOAL #3   Title Pt will be able to stand for at least 30 minutes before noticing any back pain   Status On-going     PT SHORT TERM GOAL #4   Title FOTO to 70% ability to indicate significant functional improvement   Status On-going                  Plan - 12/01/15 1023    Clinical Impression Statement Established a good HEP for Pre-Pilates core abd.  She has such a busy work schedule and finds it a challenge to set time aside.  She reports trying to integrate good posture and spinal elongation into her daily mobility.  PT Next Visit Plan check prepilates HEP, Cont PIlates on Reformer, chekc goals    PT Home Exercise Plan pelvic tilt/abd recruitment in all positions, clams, SKTC, long stretch from sink; rows with pelvic tilt    Consulted and Agree with Plan of Care Patient      Patient will benefit from skilled therapeutic intervention in order to improve the following deficits and impairments:  Difficulty walking, Increased muscle spasms, Decreased activity tolerance, Pain, Decreased strength  Visit Diagnosis: Chronic bilateral low back pain without sciatica  Muscle weakness (generalized)     Problem List Patient Active Problem List   Diagnosis Date Noted  . DOE (dyspnea on exertion) 11/22/2014  . Obesity (BMI 30-39.9) 11/22/2014  . Benign essential HTN 11/22/2014  . OSA (obstructive sleep apnea)     Tee Richeson 12/01/2015, 10:25 AM  [facility] [address] [place], [place], [address] Phone: [phone]   Fax:  [phone]  Name: Rhonda Aguilar MRN: [id number] Date of Birth: 1955-07-13   Raeford Razor, PT 12/01/15 10:26 AM Phone: [phone] Fax: [phone]

## 2015-12-01 NOTE — Patient Instructions (Signed)
Bracing With Bridging (Hook-Lying)    With neutral spine, tighten pelvic floor and abdominals and hold. Lift bottom. Repeat _10__ times. Do __2_ times a day.   Copyright  VHI. All rights reserved.   Given info on TrA and pelvic floor

## 2015-12-03 ENCOUNTER — Ambulatory Visit: Payer: 59 | Admitting: Physical Therapy

## 2015-12-03 DIAGNOSIS — M545 Low back pain: Principal | ICD-10-CM

## 2015-12-03 DIAGNOSIS — G8929 Other chronic pain: Secondary | ICD-10-CM

## 2015-12-03 DIAGNOSIS — M6281 Muscle weakness (generalized): Secondary | ICD-10-CM | POA: Diagnosis not present

## 2015-12-03 NOTE — Therapy (Signed)
[place], [place], [address] Phone: [phone]   Fax:  [phone]  Physical Therapy Treatment  Patient Details  Name: Rhonda Aguilar MRN: [id number] Date of Birth: November 21, 1955 Referring Provider: Darcus Austin, MD  Encounter Date: 12/03/2015      PT End of Session - 12/03/15 1353    Visit Number 5   Number of Visits 9   Date for PT Re-Evaluation 12/19/15   Authorization Type [facility] UMR   PT Start Time 1300   PT Stop Time 1345   PT Time Calculation (min) 45 min   Activity Tolerance Patient tolerated treatment well   Behavior During Therapy [facility] for tasks assessed/performed      Past Medical History:  Diagnosis Date  . Benign essential HTN 11/22/2014  . Hypertension   . Migraine   . Obesity (BMI 30-39.9) 11/22/2014  . OSA (obstructive sleep apnea)     Past Surgical History:  Procedure Laterality Date  . TONSILLECTOMY      There were no vitals filed for this visit.      Subjective Assessment - 12/03/15 1310    Subjective Patient reports stress may contribute, the stress of getting to PT and work!    Currently in Pain? Yes   Pain Score 4    Pain Location Back   Pain Orientation Right;Lower   Pain Descriptors / Indicators Aching   Pain Type Chronic pain   Pain Onset More than a month ago   Pain Frequency Intermittent   Aggravating Factors  sitting or standing, stress   Pain Relieving Factors heat, stretching, meds           [facility] Adult PT Treatment/Exercise - 12/03/15 1319      Lumbar Exercises: Stretches   Single Knee to Chest Stretch 5 reps;10 seconds  both   Lower Trunk Rotation 5 reps;10 seconds  both   Piriformis Stretch 2 reps;30 seconds   Piriformis Stretch Limitations knee to opp shoulder      Lumbar Exercises: Aerobic   Stationary Bike NuStep L5 UE and LE for 6 min      Lumbar Exercises: Quadruped   Madcat/Old Horse 5 reps   Plank childs pose      Ultrasound   Ultrasound Location Rt QL    Ultrasound Parameters 100%, 1.5 W/cm2 and 1 MHZ   Ultrasound Goals Pain     Manual Therapy   Manual Therapy Soft tissue mobilization;Myofascial release   Manual therapy comments mod to deep pressure with prolonged stretch prior    Soft tissue mobilization lumbar , Rt. QL, very painful and tight    Myofascial Release hip and trunk in sidelying Rt. side up                 PT Education - 12/03/15 1353    Education provided Yes   Education Details QL muscle and awareness of hip level in sitting, stretch to alleviate   Person(s) Educated Patient   Methods Explanation;Demonstration;Tactile cues;Verbal cues   Comprehension Verbalized understanding;Returned demonstration          PT Short Term Goals - 11/28/15 1115      PT SHORT TERM GOAL #1   Title Pt will be able to carry cat litter without LBP in order to complete household chores by 10/27   Status On-going     PT SHORT TERM GOAL #2   Title Pt will demonstrate proper body mechanics while sitting and standing and verbalize ability to  recruit appropriate musculature to decrease LBP   Status On-going     PT SHORT TERM GOAL #3   Title Pt will be able to stand for at least 30 minutes before noticing any back pain   Status On-going     PT SHORT TERM GOAL #4   Title FOTO to 70% ability to indicate significant functional improvement   Status On-going                  Plan - 12/03/15 1354    Clinical Impression Statement Was able to localize level of pain in lumbar spine to Rt. QL, in spasm.  Manual/soft tissue to [name] worked to relieve pain fully.  Pt requesting to come 1 x per week to avoid using FMLA to come to PT appts.  Will drop to this frequency from now on.    PT Next Visit Plan check prepilates HEP, Cont PIlates on Reformer, check goals , assess manual, [name] (QL)    PT Home Exercise Plan pelvic tilt/abd recruitment in all positions, clams, SKTC, long stretch from sink; rows with  pelvic tilt , standing /sidelying QL stretch   Consulted and Agree with Plan of Care Patient      Patient will benefit from skilled therapeutic intervention in order to improve the following deficits and impairments:  Difficulty walking, Increased muscle spasms, Decreased activity tolerance, Pain, Decreased strength  Visit Diagnosis: Chronic bilateral low back pain without sciatica  Muscle weakness (generalized)     Problem List Patient Active Problem List   Diagnosis Date Noted  . DOE (dyspnea on exertion) 11/22/2014  . Obesity (BMI 30-39.9) 11/22/2014  . Benign essential HTN 11/22/2014  . OSA (obstructive sleep apnea)     PAA,JENNIFER 12/03/2015, 1:58 PM  [facility] [address] [place], [place], [address] Phone: [phone]   Fax:  [phone]  Name: Rhonda Aguilar MRN: [id number] Date of Birth: 1955/10/27  Raeford Razor, PT 12/03/15 1:58 PM Phone: [phone] Fax: [phone]

## 2015-12-03 NOTE — Patient Instructions (Signed)
QL stretch position in sidelying with pillow to elongate Also shown alternative position in standing with hip on edge of treatment table, Rt. Foot on the floor ,lateral flexion to L.

## 2015-12-10 ENCOUNTER — Ambulatory Visit: Payer: 59 | Admitting: Physical Therapy

## 2015-12-10 ENCOUNTER — Encounter: Payer: Self-pay | Admitting: Physical Therapy

## 2015-12-10 DIAGNOSIS — M545 Low back pain: Secondary | ICD-10-CM | POA: Diagnosis not present

## 2015-12-10 DIAGNOSIS — G8929 Other chronic pain: Secondary | ICD-10-CM

## 2015-12-10 DIAGNOSIS — E669 Obesity, unspecified: Secondary | ICD-10-CM | POA: Diagnosis not present

## 2015-12-10 DIAGNOSIS — F329 Major depressive disorder, single episode, unspecified: Secondary | ICD-10-CM | POA: Diagnosis not present

## 2015-12-10 DIAGNOSIS — M6281 Muscle weakness (generalized): Secondary | ICD-10-CM | POA: Diagnosis not present

## 2015-12-10 DIAGNOSIS — I10 Essential (primary) hypertension: Secondary | ICD-10-CM | POA: Diagnosis not present

## 2015-12-10 DIAGNOSIS — R7303 Prediabetes: Secondary | ICD-10-CM | POA: Diagnosis not present

## 2015-12-10 MED FILL — DESVENLAFAXINE SUC ER 25 MG: 25 | 30 days supply | Qty: 30 | Fill #1

## 2015-12-10 NOTE — Therapy (Signed)
Rhonda Aguilar, [place], [address] Phone: [phone]   Fax:  [phone]  Physical Therapy Treatment  Patient Details  Name: Rhonda Aguilar MRN: [id number] Date of Birth: 09-05-1955 Referring Provider: Darcus Austin, MD  Encounter Date: 12/10/2015      PT End of Session - 12/10/15 1330    Visit Number 6   Number of Visits 9   Date for PT Re-Evaluation 12/19/15   Authorization Type [facility] UMR   PT Start Time 1331   PT Stop Time 1425   PT Time Calculation (min) 54 min      Past Medical History:  Diagnosis Date  . Benign essential HTN 11/22/2014  . Hypertension   . Migraine   . Obesity (BMI 30-39.9) 11/22/2014  . OSA (obstructive sleep apnea)     Past Surgical History:  Procedure Laterality Date  . TONSILLECTOMY      There were no vitals filed for this visit.      Subjective Assessment - 12/10/15 1331    Subjective pt reports feeling better today. Has been trying to sleep with pillow between knees.    Patient Stated Goals decrease pain, evaluate body mechanics, cat litter 20-25 lb, lift case of water, prevent further problems   Currently in Pain? No/denies                         [facility] Adult PT Treatment/Exercise - 12/10/15 0001      Lumbar Exercises: Stretches   Standing Extension Limitations door pec stretch     Lumbar Exercises: Aerobic   Stationary Bike nu step L 5 5'     Lumbar Exercises: Supine   AB Set Limitations seated on physioball + UE horiz abd red tband   Clam 20 reps   Clam Limitations used ball under pelvis to challenge stability    Bent Knee Raise 20 reps   Bent Knee Raise Limitations used ball under pelvis again    Bridge 20 reps   Bridge Limitations red tband   Other Supine Lumbar Exercises single leg bridges alt R/L     Lumbar Exercises: Quadruped   Madcat/Old Horse 5 reps   Plank childs pose + R/L bias; qped plank     Moist Heat Therapy   Number  Minutes Moist Heat 15 Minutes   Moist Heat Location Lumbar Spine                  PT Short Term Goals - 11/28/15 1115      PT SHORT TERM GOAL #1   Title Pt will be able to carry cat litter without LBP in order to complete household chores by 10/27   Status On-going     PT SHORT TERM GOAL #2   Title Pt will demonstrate proper body mechanics while sitting and standing and verbalize ability to recruit appropriate musculature to decrease LBP   Status On-going     PT SHORT TERM GOAL #3   Title Pt will be able to stand for at least 30 minutes before noticing any back pain   Status On-going     PT SHORT TERM GOAL #4   Title FOTO to 70% ability to indicate significant functional improvement   Status On-going                  Plan - 12/10/15 1340    Clinical Impression Statement pt reports being comfortable with her HEP and knows  when to use her abdominal contraction and stretches to decrease pain. Fatigue notable with exercises today and some cuing required for pelvic tilt during plank.    PT Next Visit Plan review HEP, D/C   PT Home Exercise Plan pelvic tilt/abd recruitment in all positions, clams, SKTC, long stretch from sink; rows with pelvic tilt , standing /sidelying QL stretch   Consulted and Agree with Plan of Care Patient      Patient will benefit from skilled therapeutic intervention in order to improve the following deficits and impairments:     Visit Diagnosis: Chronic bilateral low back pain without sciatica  Muscle weakness (generalized)     Problem List Patient Active Problem List   Diagnosis Date Noted  . DOE (dyspnea on exertion) 11/22/2014  . Obesity (BMI 30-39.9) 11/22/2014  . Benign essential HTN 11/22/2014  . OSA (obstructive sleep apnea)     Rhonda Aguilar C. Rhonda Aguilar PT, DPT 12/10/15 2:29 PM   [place] [facility] [address] [place], [place], [address] Phone: [phone]   Fax:   [phone]  Name: Rhonda Aguilar MRN: [id number] Date of Birth: January 16, 1956

## 2015-12-12 ENCOUNTER — Ambulatory Visit: Payer: 59 | Admitting: Physical Therapy

## 2015-12-16 ENCOUNTER — Other Ambulatory Visit: Payer: Self-pay | Admitting: Obstetrics and Gynecology

## 2015-12-16 ENCOUNTER — Ambulatory Visit: Payer: 59 | Admitting: Physical Therapy

## 2015-12-16 DIAGNOSIS — M545 Low back pain: Secondary | ICD-10-CM | POA: Diagnosis not present

## 2015-12-16 DIAGNOSIS — F341 Dysthymic disorder: Secondary | ICD-10-CM | POA: Diagnosis not present

## 2015-12-16 DIAGNOSIS — M6281 Muscle weakness (generalized): Secondary | ICD-10-CM

## 2015-12-16 DIAGNOSIS — Z1231 Encounter for screening mammogram for malignant neoplasm of breast: Secondary | ICD-10-CM

## 2015-12-16 DIAGNOSIS — G8929 Other chronic pain: Secondary | ICD-10-CM

## 2015-12-16 NOTE — Therapy (Signed)
[place], [place], [address] Phone: [phone]   Fax:  [phone]  Physical Therapy Treatment  Patient Details  Name: Rhonda Aguilar MRN: [id number] Date of Birth: Aug 02, 1955 Referring Provider: Darcus Austin, MD  Encounter Date: 12/16/2015      PT End of Session - 12/16/15 1012    Visit Number 7   Number of Visits 9   Date for PT Re-Evaluation 12/19/15   PT Start Time 0933   PT Stop Time 1022   PT Time Calculation (min) 49 min   Activity Tolerance Patient tolerated treatment well   Behavior During Therapy [facility] for tasks assessed/performed      Past Medical History:  Diagnosis Date  . Benign essential HTN 11/22/2014  . Hypertension   . Migraine   . Obesity (BMI 30-39.9) 11/22/2014  . OSA (obstructive sleep apnea)     Past Surgical History:  Procedure Laterality Date  . TONSILLECTOMY      There were no vitals filed for this visit.      Subjective Assessment - 12/16/15 0942    Subjective No significant pain this AM.  Just tight all over.  Has not done HEP this weekend.  I wish I could be more motivated to exercise.     Currently in Pain? No/denies            [facility] PT Assessment - 12/16/15 1224      Observation/Other Assessments   Focus on Therapeutic Outcomes (FOTO)  80%                     [facility] Adult PT Treatment/Exercise - 12/16/15 0940      Lumbar Exercises: Stretches   Active Hamstring Stretch 2 reps;30 seconds   Single Knee to Chest Stretch 2 reps;30 seconds   Lower Trunk Rotation 5 reps;10 seconds  both     Lumbar Exercises: Supine   Clam 10 reps   Clam Limitations ball under heels    Heel Slides 10 reps   Heel Slides Limitations ball under heels    Bridge 10 reps   Bridge Limitations on ball , 2 sets 1 without arm support    Large Ball Oblique Isometric 5 reps   Large Ball Oblique Isometric Limitations oblique twist with ball      Lumbar Exercises:  Sidelying   Other Sidelying Lumbar Exercises QL stretch and then manual      Moist Heat Therapy   Number Minutes Moist Heat 10 Minutes   Moist Heat Location Lumbar Spine     Manual Therapy   Manual Therapy Soft tissue mobilization;Myofascial release   Manual therapy comments mod to deep pressure with prolonged stretch prior    Soft tissue mobilization lumbar , Rt. QL, very painful and tight    Myofascial Release hip and trunk in sidelying Rt. side up                 PT Education - 12/16/15 1013    Education provided Yes   Education Details HEP review, exercise principles , massage therapist    Person(s) Educated Patient   Methods Explanation;Demonstration   Comprehension Verbalized understanding;Need further instruction;Returned demonstration          PT Short Term Goals - 12/16/15 0955      PT SHORT TERM GOAL #1   Title Pt will be able to carry cat litter without LBP in order to complete household chores by 10/27   Status  Achieved     PT SHORT TERM GOAL #2   Title Pt will demonstrate proper body mechanics while sitting and standing and verbalize ability to recruit appropriate musculature to decrease LBP   Status Achieved     PT SHORT TERM GOAL #3   Title Pt will be able to stand for at least 30 minutes before noticing any back pain   Baseline 15-20 min    Status Partially Met     PT SHORT TERM GOAL #4   Title FOTO to 70% ability to indicate significant functional improvement   Baseline 80%   Status Achieved                  Plan - 12/16/15 1014    Clinical Impression Statement Pt feels ready for DC.  She has noticed a better ability to stand before noticing back pain.  She is getting ready to being a new nutrition program, adding in exercise and plans to join a community Pilates class.     PT Next Visit Plan na   PT Home Exercise Plan pelvic tilt/abd recruitment in all positions, clams, SKTC, long stretch from sink; rows with pelvic tilt ,  standing /sidelying QL stretch   Consulted and Agree with Plan of Care Patient      Patient will benefit from skilled therapeutic intervention in order to improve the following deficits and impairments:  Difficulty walking, Increased muscle spasms, Decreased activity tolerance, Pain, Decreased strength  Visit Diagnosis: Chronic bilateral low back pain without sciatica  Muscle weakness (generalized)     Problem List Patient Active Problem List   Diagnosis Date Noted  . DOE (dyspnea on exertion) 11/22/2014  . Obesity (BMI 30-39.9) 11/22/2014  . Benign essential HTN 11/22/2014  . OSA (obstructive sleep apnea)     Georgio Hattabaugh 12/16/2015, 12:24 PM  [facility] [address] [place], [place], [address] Phone: [phone]   Fax:  [phone]  Name: Rhonda Aguilar MRN: [id number] Date of Birth: [birth year]   PHYSICAL THERAPY DISCHARGE SUMMARY  Visits from Start of Care: 7  Current functional level related to goals / functional outcomes: See above.  She reports knowing what to do for her pain, exercises help her.    Remaining deficits: Min weakness in core, lateral hips.  Hypertonic quadratus lumborum .  Self reports lack of motivation.    Education / Equipment: HEP, posture, Economist, Pilates Plan: Patient agrees to discharge.  Patient goals were partially met. Patient is being discharged due to the patient's request.  ?????     Raeford Razor, PT 12/16/15 12:25 PM Phone: [phone] Fax: [phone]

## 2015-12-18 ENCOUNTER — Encounter: Payer: 59 | Admitting: Physical Therapy

## 2015-12-24 DIAGNOSIS — F339 Major depressive disorder, recurrent, unspecified: Secondary | ICD-10-CM | POA: Diagnosis not present

## 2016-01-06 ENCOUNTER — Ambulatory Visit
Admission: RE | Admit: 2016-01-06 | Discharge: 2016-01-06 | Disposition: A | Discharge status: Home / Self Care | Payer: 59 | Source: Ambulatory Visit | Attending: Obstetrics and Gynecology | Admitting: Obstetrics and Gynecology

## 2016-01-06 DIAGNOSIS — Z1231 Encounter for screening mammogram for malignant neoplasm of breast: Secondary | ICD-10-CM

## 2016-01-13 MED FILL — VALSARTAN 160 MG TABLET: 160 | 90 days supply | Qty: 90 | Fill #1

## 2016-01-13 MED FILL — DESVENLAFAXINE SUC ER 25 MG: 25 | 30 days supply | Qty: 30 | Fill #2

## 2016-01-14 MED FILL — busPIRone HCL 5 MG TABS: 5 | 90 days supply | Qty: 90 | Fill #0

## 2016-01-28 DIAGNOSIS — R635 Abnormal weight gain: Secondary | ICD-10-CM | POA: Diagnosis not present

## 2016-02-05 DIAGNOSIS — G4733 Obstructive sleep apnea (adult) (pediatric): Secondary | ICD-10-CM | POA: Diagnosis not present

## 2016-02-05 DIAGNOSIS — E7849 Other hyperlipidemia: Secondary | ICD-10-CM | POA: Insufficient documentation

## 2016-02-05 DIAGNOSIS — I10 Essential (primary) hypertension: Secondary | ICD-10-CM | POA: Insufficient documentation

## 2016-02-05 DIAGNOSIS — E669 Obesity, unspecified: Secondary | ICD-10-CM | POA: Diagnosis not present

## 2016-02-05 DIAGNOSIS — R635 Abnormal weight gain: Secondary | ICD-10-CM | POA: Diagnosis not present

## 2016-02-05 DIAGNOSIS — K219 Gastro-esophageal reflux disease without esophagitis: Secondary | ICD-10-CM | POA: Diagnosis not present

## 2016-02-05 DIAGNOSIS — E781 Pure hyperglyceridemia: Secondary | ICD-10-CM | POA: Diagnosis not present

## 2016-02-11 MED FILL — DESVENLAFAXINE SUC ER 25 MG: 25 | 90 days supply | Qty: 90 | Fill #0

## 2016-02-11 MED FILL — VERAPAMIL ER 240 MG TABLET: 240 | 90 days supply | Qty: 90 | Fill #1

## 2016-03-01 MED FILL — DEXILANT DR 60 MG CAPSULE: 60 | 30 days supply | Qty: 30 | Fill #0

## 2016-03-16 DIAGNOSIS — E669 Obesity, unspecified: Secondary | ICD-10-CM | POA: Diagnosis not present

## 2016-03-16 DIAGNOSIS — G4733 Obstructive sleep apnea (adult) (pediatric): Secondary | ICD-10-CM | POA: Diagnosis not present

## 2016-03-16 DIAGNOSIS — Z6838 Body mass index (BMI) 38.0-38.9, adult: Secondary | ICD-10-CM | POA: Diagnosis not present

## 2016-03-16 DIAGNOSIS — F339 Major depressive disorder, recurrent, unspecified: Secondary | ICD-10-CM | POA: Diagnosis not present

## 2016-03-16 DIAGNOSIS — I10 Essential (primary) hypertension: Secondary | ICD-10-CM | POA: Diagnosis not present

## 2016-03-16 DIAGNOSIS — R635 Abnormal weight gain: Secondary | ICD-10-CM | POA: Diagnosis not present

## 2016-03-16 DIAGNOSIS — K219 Gastro-esophageal reflux disease without esophagitis: Secondary | ICD-10-CM | POA: Diagnosis not present

## 2016-03-16 DIAGNOSIS — E781 Pure hyperglyceridemia: Secondary | ICD-10-CM | POA: Diagnosis not present

## 2016-03-16 MED FILL — NALTREXONE 50 MG TABLET: 50 | 30 days supply | Qty: 15 | Fill #0

## 2016-03-16 MED FILL — BUPROPION HCL XL 150 MG TAB: 150 | 30 days supply | Qty: 30 | Fill #0

## 2016-04-06 DIAGNOSIS — E669 Obesity, unspecified: Secondary | ICD-10-CM | POA: Diagnosis not present

## 2016-04-06 DIAGNOSIS — E8881 Metabolic syndrome: Secondary | ICD-10-CM | POA: Diagnosis not present

## 2016-04-06 DIAGNOSIS — E781 Pure hyperglyceridemia: Secondary | ICD-10-CM | POA: Diagnosis not present

## 2016-04-06 DIAGNOSIS — I10 Essential (primary) hypertension: Secondary | ICD-10-CM | POA: Diagnosis not present

## 2016-04-06 DIAGNOSIS — R635 Abnormal weight gain: Secondary | ICD-10-CM | POA: Diagnosis not present

## 2016-04-06 DIAGNOSIS — F341 Dysthymic disorder: Secondary | ICD-10-CM | POA: Diagnosis not present

## 2016-04-15 MED FILL — DEXILANT DR 60 MG CAPSULE: 60 | 30 days supply | Qty: 30 | Fill #0

## 2016-04-15 MED FILL — VALSARTAN 320 MG TAB: 320 | 90 days supply | Qty: 90 | Fill #0

## 2016-04-20 DIAGNOSIS — F329 Major depressive disorder, single episode, unspecified: Secondary | ICD-10-CM | POA: Diagnosis not present

## 2016-04-20 DIAGNOSIS — R635 Abnormal weight gain: Secondary | ICD-10-CM | POA: Diagnosis not present

## 2016-04-23 MED FILL — busPIRone HCL 5 MG TABS: 5 | 90 days supply | Qty: 90 | Fill #0

## 2016-05-06 MED FILL — VERAPAMIL ER 240 MG TABLET: 240 | 90 days supply | Qty: 90 | Fill #0

## 2016-05-17 DIAGNOSIS — F339 Major depressive disorder, recurrent, unspecified: Secondary | ICD-10-CM | POA: Diagnosis not present

## 2016-05-17 DIAGNOSIS — F341 Dysthymic disorder: Secondary | ICD-10-CM | POA: Diagnosis not present

## 2016-05-17 MED FILL — SAXENDA 18 MG/3 ML PEN: 18 | 44 days supply | Qty: 15 | Fill #0

## 2016-05-26 DIAGNOSIS — F329 Major depressive disorder, single episode, unspecified: Secondary | ICD-10-CM | POA: Diagnosis not present

## 2016-05-26 DIAGNOSIS — G4733 Obstructive sleep apnea (adult) (pediatric): Secondary | ICD-10-CM | POA: Diagnosis not present

## 2016-05-26 DIAGNOSIS — Z6839 Body mass index (BMI) 39.0-39.9, adult: Secondary | ICD-10-CM | POA: Diagnosis not present

## 2016-05-26 DIAGNOSIS — E669 Obesity, unspecified: Secondary | ICD-10-CM | POA: Diagnosis not present

## 2016-05-26 DIAGNOSIS — E8881 Metabolic syndrome: Secondary | ICD-10-CM | POA: Diagnosis not present

## 2016-05-26 DIAGNOSIS — M25511 Pain in right shoulder: Secondary | ICD-10-CM | POA: Diagnosis not present

## 2016-05-26 DIAGNOSIS — K219 Gastro-esophageal reflux disease without esophagitis: Secondary | ICD-10-CM | POA: Diagnosis not present

## 2016-05-26 DIAGNOSIS — I10 Essential (primary) hypertension: Secondary | ICD-10-CM | POA: Diagnosis not present

## 2016-05-26 DIAGNOSIS — R7303 Prediabetes: Secondary | ICD-10-CM | POA: Diagnosis not present

## 2016-05-31 DIAGNOSIS — M25511 Pain in right shoulder: Secondary | ICD-10-CM | POA: Diagnosis not present

## 2016-05-31 DIAGNOSIS — M542 Cervicalgia: Secondary | ICD-10-CM | POA: Diagnosis not present

## 2016-06-02 MED FILL — predniSONE 10 MG TABS: 10 | 8 days supply | Qty: 20 | Fill #0

## 2016-06-22 DIAGNOSIS — K760 Fatty (change of) liver, not elsewhere classified: Secondary | ICD-10-CM | POA: Diagnosis not present

## 2016-06-22 DIAGNOSIS — R635 Abnormal weight gain: Secondary | ICD-10-CM | POA: Diagnosis not present

## 2016-06-22 DIAGNOSIS — R194 Change in bowel habit: Secondary | ICD-10-CM | POA: Diagnosis not present

## 2016-06-22 DIAGNOSIS — K219 Gastro-esophageal reflux disease without esophagitis: Secondary | ICD-10-CM | POA: Diagnosis not present

## 2016-06-22 MED FILL — DESVENLAFAXINE SUC ER 50 MG: 50 | 90 days supply | Qty: 90 | Fill #0

## 2016-06-22 MED FILL — DEXILANT DR 60 MG CAPSULE: 60 | 30 days supply | Qty: 30 | Fill #0

## 2016-06-30 DIAGNOSIS — G4733 Obstructive sleep apnea (adult) (pediatric): Secondary | ICD-10-CM | POA: Diagnosis not present

## 2016-06-30 DIAGNOSIS — E669 Obesity, unspecified: Secondary | ICD-10-CM | POA: Diagnosis not present

## 2016-06-30 DIAGNOSIS — Z6839 Body mass index (BMI) 39.0-39.9, adult: Secondary | ICD-10-CM | POA: Diagnosis not present

## 2016-06-30 DIAGNOSIS — Z9989 Dependence on other enabling machines and devices: Secondary | ICD-10-CM | POA: Diagnosis not present

## 2016-06-30 DIAGNOSIS — K219 Gastro-esophageal reflux disease without esophagitis: Secondary | ICD-10-CM | POA: Diagnosis not present

## 2016-06-30 DIAGNOSIS — E8881 Metabolic syndrome: Secondary | ICD-10-CM | POA: Diagnosis not present

## 2016-06-30 DIAGNOSIS — I10 Essential (primary) hypertension: Secondary | ICD-10-CM | POA: Diagnosis not present

## 2016-06-30 DIAGNOSIS — F339 Major depressive disorder, recurrent, unspecified: Secondary | ICD-10-CM | POA: Diagnosis not present

## 2016-07-15 MED FILL — VALSARTAN 320 MG TABLET: 320 | 90 days supply | Qty: 90 | Fill #0

## 2016-07-16 MED FILL — SAXENDA 18 MG/3 ML PEN: 18 | 30 days supply | Qty: 15 | Fill #0

## 2016-07-27 MED FILL — DEXILANT DR 60 MG CAPSULE: 60 | 30 days supply | Qty: 30 | Fill #1

## 2016-07-27 MED FILL — busPIRone HCL 5 MG TABS: 5 | 90 days supply | Qty: 90 | Fill #1

## 2016-08-04 DIAGNOSIS — Z713 Dietary counseling and surveillance: Secondary | ICD-10-CM | POA: Diagnosis not present

## 2016-08-04 DIAGNOSIS — E669 Obesity, unspecified: Secondary | ICD-10-CM | POA: Diagnosis not present

## 2016-08-05 MED FILL — VERAPAMIL ER 240 MG TABLET: 240 | 90 days supply | Qty: 90 | Fill #0

## 2016-08-20 MED FILL — SAXENDA 18 MG/3 ML PEN: 18 | 30 days supply | Qty: 15 | Fill #1

## 2016-08-24 DIAGNOSIS — E785 Hyperlipidemia, unspecified: Secondary | ICD-10-CM | POA: Diagnosis not present

## 2016-08-24 DIAGNOSIS — E8881 Metabolic syndrome: Secondary | ICD-10-CM | POA: Diagnosis not present

## 2016-08-26 MED FILL — DEXILANT DR 60 MG CAPSULE: 60 | 30 days supply | Qty: 30 | Fill #2

## 2016-09-01 DIAGNOSIS — C44319 Basal cell carcinoma of skin of other parts of face: Secondary | ICD-10-CM | POA: Diagnosis not present

## 2016-09-01 DIAGNOSIS — L821 Other seborrheic keratosis: Secondary | ICD-10-CM | POA: Diagnosis not present

## 2016-09-01 DIAGNOSIS — D225 Melanocytic nevi of trunk: Secondary | ICD-10-CM | POA: Diagnosis not present

## 2016-09-10 DIAGNOSIS — E781 Pure hyperglyceridemia: Secondary | ICD-10-CM | POA: Diagnosis not present

## 2016-09-10 DIAGNOSIS — F341 Dysthymic disorder: Secondary | ICD-10-CM | POA: Diagnosis not present

## 2016-09-10 DIAGNOSIS — E786 Lipoprotein deficiency: Secondary | ICD-10-CM | POA: Diagnosis not present

## 2016-09-10 DIAGNOSIS — E669 Obesity, unspecified: Secondary | ICD-10-CM | POA: Diagnosis not present

## 2016-09-10 DIAGNOSIS — E8881 Metabolic syndrome: Secondary | ICD-10-CM | POA: Insufficient documentation

## 2016-09-10 DIAGNOSIS — I10 Essential (primary) hypertension: Secondary | ICD-10-CM | POA: Diagnosis not present

## 2016-09-10 DIAGNOSIS — Z6839 Body mass index (BMI) 39.0-39.9, adult: Secondary | ICD-10-CM | POA: Diagnosis not present

## 2016-09-10 MED FILL — valACYclovir HCL 1 GM TABS: 1 | 7 days supply | Qty: 30 | Fill #0

## 2016-09-13 MED FILL — LOSARTAN POTASSIUM 100 MG T: 100 | 30 days supply | Qty: 30 | Fill #0

## 2016-09-23 DIAGNOSIS — F341 Dysthymic disorder: Secondary | ICD-10-CM | POA: Diagnosis not present

## 2016-09-23 MED FILL — DEXILANT DR 60 MG CAPSULE: 60 | 30 days supply | Qty: 30 | Fill #3

## 2016-09-23 MED FILL — DESVENLAFAXINE SUC ER 50 MG: 50 | 90 days supply | Qty: 90 | Fill #0

## 2016-09-27 DIAGNOSIS — G4733 Obstructive sleep apnea (adult) (pediatric): Secondary | ICD-10-CM | POA: Diagnosis not present

## 2016-10-08 DIAGNOSIS — E786 Lipoprotein deficiency: Secondary | ICD-10-CM | POA: Diagnosis not present

## 2016-10-08 DIAGNOSIS — I10 Essential (primary) hypertension: Secondary | ICD-10-CM | POA: Diagnosis not present

## 2016-10-08 DIAGNOSIS — E8881 Metabolic syndrome: Secondary | ICD-10-CM | POA: Diagnosis not present

## 2016-10-08 DIAGNOSIS — E669 Obesity, unspecified: Secondary | ICD-10-CM | POA: Diagnosis not present

## 2016-10-08 DIAGNOSIS — R635 Abnormal weight gain: Secondary | ICD-10-CM | POA: Diagnosis not present

## 2016-10-08 DIAGNOSIS — E781 Pure hyperglyceridemia: Secondary | ICD-10-CM | POA: Diagnosis not present

## 2016-10-08 DIAGNOSIS — Z6839 Body mass index (BMI) 39.0-39.9, adult: Secondary | ICD-10-CM | POA: Diagnosis not present

## 2016-10-13 DIAGNOSIS — Z08 Encounter for follow-up examination after completed treatment for malignant neoplasm: Secondary | ICD-10-CM | POA: Diagnosis not present

## 2016-10-13 DIAGNOSIS — Z85828 Personal history of other malignant neoplasm of skin: Secondary | ICD-10-CM | POA: Diagnosis not present

## 2016-10-13 MED FILL — MUPIROCIN 2% OINTMENT: 2 | 14 days supply | Qty: 22 | Fill #0

## 2016-10-13 MED FILL — LOSARTAN POTASSIUM 100 MG T: 100 | 90 days supply | Qty: 90 | Fill #0

## 2016-10-21 MED FILL — busPIRone HCL 5 MG TABS: 5 | 90 days supply | Qty: 90 | Fill #0

## 2016-11-03 MED FILL — DEXILANT DR 60 MG CAPSULE: 60 | 30 days supply | Qty: 30 | Fill #4

## 2016-11-03 MED FILL — VERAPAMIL ER 240 MG TABLET: 240 | 90 days supply | Qty: 90 | Fill #1

## 2016-11-25 DIAGNOSIS — R7303 Prediabetes: Secondary | ICD-10-CM | POA: Diagnosis not present

## 2016-11-25 DIAGNOSIS — Z6841 Body Mass Index (BMI) 40.0 and over, adult: Secondary | ICD-10-CM | POA: Diagnosis not present

## 2016-11-25 DIAGNOSIS — I10 Essential (primary) hypertension: Secondary | ICD-10-CM | POA: Diagnosis not present

## 2016-11-25 DIAGNOSIS — E782 Mixed hyperlipidemia: Secondary | ICD-10-CM | POA: Diagnosis not present

## 2016-12-10 DIAGNOSIS — F341 Dysthymic disorder: Secondary | ICD-10-CM | POA: Diagnosis not present

## 2016-12-10 MED FILL — DEXILANT DR 60 MG CAPSULE: 60 | 30 days supply | Qty: 30 | Fill #5

## 2016-12-15 DIAGNOSIS — F339 Major depressive disorder, recurrent, unspecified: Secondary | ICD-10-CM | POA: Diagnosis not present

## 2016-12-21 DIAGNOSIS — F341 Dysthymic disorder: Secondary | ICD-10-CM | POA: Diagnosis not present

## 2016-12-28 MED FILL — DESVENLAFAXINE SUC ER 50 MG: 50 | 90 days supply | Qty: 90 | Fill #1

## 2017-01-05 DIAGNOSIS — F341 Dysthymic disorder: Secondary | ICD-10-CM | POA: Diagnosis not present

## 2017-01-11 MED FILL — DEXILANT DR 60 MG CAPSULE: 60 | 30 days supply | Qty: 30 | Fill #6

## 2017-01-12 MED FILL — LOSARTAN POTASSIUM 100 MG T: 100 | 90 days supply | Qty: 90 | Fill #0

## 2017-01-18 DIAGNOSIS — F341 Dysthymic disorder: Secondary | ICD-10-CM | POA: Diagnosis not present

## 2017-01-21 MED FILL — busPIRone HCL 5 MG TABS: 5 | 90 days supply | Qty: 90 | Fill #1

## 2017-02-03 DIAGNOSIS — F341 Dysthymic disorder: Secondary | ICD-10-CM | POA: Diagnosis not present

## 2017-02-09 MED FILL — DEXILANT DR 60 MG CAPSULE: 60 | 30 days supply | Qty: 30 | Fill #7

## 2017-02-10 MED FILL — VERAPAMIL ER 240 MG TABLET: 240 | 90 days supply | Qty: 90 | Fill #0

## 2017-02-26 ENCOUNTER — Ambulatory Visit (INDEPENDENT_AMBULATORY_CARE_PROVIDER_SITE_OTHER): Payer: Self-pay | Admitting: Family

## 2017-02-26 ENCOUNTER — Encounter: Payer: Self-pay | Admitting: Family

## 2017-02-26 VITALS — BP 120/84 | HR 89 | Temp 98.4°F | Wt 227.2 lb

## 2017-02-26 DIAGNOSIS — J019 Acute sinusitis, unspecified: Secondary | ICD-10-CM

## 2017-02-26 DIAGNOSIS — B9689 Other specified bacterial agents as the cause of diseases classified elsewhere: Secondary | ICD-10-CM

## 2017-02-26 DIAGNOSIS — R0982 Postnasal drip: Secondary | ICD-10-CM

## 2017-02-26 MED ORDER — FLUCONAZOLE 150 MG PO TABS: 150.0000 mg | ORAL_TABLET | Freq: Once | ORAL | 0 refills | Status: AC

## 2017-02-26 MED ORDER — DOXYCYCLINE HYCLATE 100 MG PO TABS: 100.0000 mg | ORAL_TABLET | Freq: Two times a day (BID) | ORAL | 0 refills | Status: DC

## 2017-02-26 MED ORDER — PROMETHAZINE-PHENYLEPHRINE 6.25-5 MG/5ML PO SYRP: 5.0000 mL | ORAL_SOLUTION | Freq: Three times a day (TID) | ORAL | 0 refills | Status: DC | PRN

## 2017-02-26 NOTE — Patient Instructions (Signed)

## 2017-02-26 NOTE — Progress Notes (Signed)
Subjective:     Patient ID: Rhonda Aguilar, female   DOB: [birth year], 62 y.o.   MRN: [id number]  HPI Patient is in today with c/o sinus pressure, pain in her teeth, cough, nasal congestion, hoarseness, and post-nasal drip that is worse at night x 1 week. Has been taking mucinex dm without much relief.   Review of Systems  Constitutional: Positive for fatigue. Negative for chills and fever.  HENT: Positive for congestion, facial swelling, postnasal drip, rhinorrhea, sinus pressure and sore throat.   Respiratory: Positive for cough. Negative for shortness of breath and wheezing.   Cardiovascular: Negative.   Gastrointestinal: Negative.   Endocrine: Negative.   Musculoskeletal: Negative.   Allergic/Immunologic: Negative.   Neurological: Negative.   Psychiatric/Behavioral: Negative.    Past Medical History:  Diagnosis Date  . Benign essential HTN 11/22/2014  . Hypertension   . Migraine   . Obesity (BMI 30-39.9) 11/22/2014  . OSA (obstructive sleep apnea)     Social History   Socioeconomic History  . Marital status: Divorced    Spouse name: Not on file  . Number of children: 0  . Years of education: college  . Highest education level: Not on file  Social Needs  . Financial resource strain: Not on file  . Food insecurity - worry: Not on file  . Food insecurity - inability: Not on file  . Transportation needs - medical: Not on file  . Transportation needs - non-medical: Not on file  Occupational History  . Occupation: Engineer, mining  Tobacco Use  . Smoking status: Never Smoker  . Smokeless tobacco: Never Used  Substance and Sexual Activity  . Alcohol use: Yes    Alcohol/week: 0.0 oz  . Drug use: Not on file  . Sexual activity: Not on file  Other Topics Concern  . Not on file  Social History Narrative  . Not on file    Past Surgical History:  Procedure Laterality Date  . TONSILLECTOMY      Family History  Problem Relation Age of Onset  . Diabetes Mother   .  Heart disease Mother   . Hypertension Mother   . Coronary artery disease Father   . Hypertension Brother     Allergies  Allergen Reactions  . Codeine Itching and Rash  . Penicillins Rash    Current Outpatient Medications on File Prior to Visit  Medication Sig Dispense Refill  . busPIRone (BUSPAR) 5 MG tablet Take 5 mg by mouth 2 (two) times daily.    . cyclobenzaprine (FLEXERIL) 10 MG tablet Take 10 mg by mouth 3 (three) times daily as needed for muscle spasms.    [name] dexlansoprazole (DEXILANT) 60 MG capsule Take 60 mg by mouth daily.    . hydrochlorothiazide (MICROZIDE) 12.5 MG capsule Take 12.5 mg by mouth daily. Pt takes Mon, [place] and Fri.  1  . ibuprofen (ADVIL,MOTRIN) 600 MG tablet Take 600 mg by mouth every 6 (six) hours as needed for mild pain.    [name] metoCLOPramide (REGLAN) 10 MG tablet Take 10 mg by mouth as needed for nausea. migraines    . valsartan (DIOVAN) 320 MG tablet Take 160 mg by mouth daily.     . verapamil (VERELAN PM) 240 MG 24 hr capsule Take 240 mg by mouth at bedtime.     No current facility-administered medications on file prior to visit.     BP 120/84   Pulse 89   Temp 98.4 F (36.9 C)   Wt 227 lb  3.2 oz (103.1 kg)   SpO2 96%   BMI 40.89 kg/m chart    Objective:   Physical Exam     Assessment:     Rhonda Aguilar was seen today for *choice* sinus issues.  Diagnoses and all orders for this visit:  Acute bacterial sinusitis  Post-nasal drainage  Other orders -     fluconazole (DIFLUCAN) 150 MG tablet; Take 1 tablet (150 mg total) by mouth once for 1 dose. -     doxycycline (VIBRA-TABS) 100 MG tablet; Take 1 tablet (100 mg total) by mouth 2 (two) times daily. -     promethazine-phenylephrine (PROMETHAZINE-PHENYLEPHRINE) 6.25-5 MG/5ML SYRP; Take 5 mLs by mouth every 8 (eight) hours as needed for congestion.      Plan:     Call the office if symptoms worsen or persist. Recheck as needed

## 2017-03-15 MED FILL — DEXILANT DR 60 MG CAPSULE: 60 | 30 days supply | Qty: 30 | Fill #8

## 2017-03-28 MED FILL — DESVENLAFAXINE SUC ER 50 MG: 50 | 90 days supply | Qty: 90 | Fill #0

## 2017-04-15 MED FILL — DEXILANT DR 60 MG CAPSULE: 60 | 30 days supply | Qty: 30 | Fill #9

## 2017-04-20 MED FILL — busPIRone HCL 5 MG TABS: 5 | 90 days supply | Qty: 90 | Fill #0

## 2017-04-20 MED FILL — LOSARTAN POTASSIUM 100 MG T: 100 | 90 days supply | Qty: 90 | Fill #1

## 2017-04-22 MED FILL — VERAPAMIL ER PM 300 MG CAP: 300 | 30 days supply | Qty: 30 | Fill #0

## 2017-04-26 DIAGNOSIS — F341 Dysthymic disorder: Secondary | ICD-10-CM | POA: Diagnosis not present

## 2017-04-28 DIAGNOSIS — Z6841 Body Mass Index (BMI) 40.0 and over, adult: Secondary | ICD-10-CM | POA: Diagnosis not present

## 2017-04-28 DIAGNOSIS — R21 Rash and other nonspecific skin eruption: Secondary | ICD-10-CM | POA: Diagnosis not present

## 2017-04-28 DIAGNOSIS — I10 Essential (primary) hypertension: Secondary | ICD-10-CM | POA: Diagnosis not present

## 2017-04-28 MED FILL — VALSARTAN 320 MG TAB: 320 | 30 days supply | Qty: 30 | Fill #0

## 2017-04-28 MED FILL — NYSTATIN-TRIAMCINOLONE OINT: 100000-0.1 | 20 days supply | Qty: 60 | Fill #0

## 2017-05-06 DIAGNOSIS — H5213 Myopia, bilateral: Secondary | ICD-10-CM | POA: Diagnosis not present

## 2017-05-19 MED FILL — DEXILANT DR 60 MG CAPSULE: 60 | 30 days supply | Qty: 30 | Fill #10

## 2017-05-23 DIAGNOSIS — E782 Mixed hyperlipidemia: Secondary | ICD-10-CM | POA: Diagnosis not present

## 2017-05-23 DIAGNOSIS — I10 Essential (primary) hypertension: Secondary | ICD-10-CM | POA: Diagnosis not present

## 2017-05-23 DIAGNOSIS — R7303 Prediabetes: Secondary | ICD-10-CM | POA: Diagnosis not present

## 2017-05-24 ENCOUNTER — Other Ambulatory Visit: Payer: Self-pay | Admitting: Family Medicine

## 2017-05-24 DIAGNOSIS — Z1231 Encounter for screening mammogram for malignant neoplasm of breast: Secondary | ICD-10-CM

## 2017-05-25 MED FILL — VERAPAMIL ER PM 300 MG CAP: 300 | 30 days supply | Qty: 30 | Fill #0

## 2017-05-27 MED FILL — VALSARTAN 320 MG TAB: 320 | 30 days supply | Qty: 30 | Fill #1

## 2017-06-01 ENCOUNTER — Ambulatory Visit
Admission: RE | Admit: 2017-06-01 | Discharge: 2017-06-01 | Disposition: A | Discharge status: Home / Self Care | Payer: 59 | Source: Ambulatory Visit | Attending: Family Medicine | Admitting: Family Medicine

## 2017-06-01 DIAGNOSIS — F339 Major depressive disorder, recurrent, unspecified: Secondary | ICD-10-CM | POA: Diagnosis not present

## 2017-06-01 DIAGNOSIS — Z1231 Encounter for screening mammogram for malignant neoplasm of breast: Secondary | ICD-10-CM | POA: Diagnosis not present

## 2017-06-01 MED FILL — DESVENLAFAXINE SUC ER 25 MG: 25 | 90 days supply | Qty: 90 | Fill #0

## 2017-06-15 DIAGNOSIS — F341 Dysthymic disorder: Secondary | ICD-10-CM | POA: Diagnosis not present

## 2017-06-20 MED FILL — DEXILANT DR 60 MG CAPSULE: 60 | 30 days supply | Qty: 30 | Fill #11

## 2017-06-24 MED FILL — VERAPAMIL ER PM 300 MG CAP: 300 | 30 days supply | Qty: 30 | Fill #1

## 2017-07-04 MED FILL — VALSARTAN 320 MG TAB: 320 | 30 days supply | Qty: 30 | Fill #0

## 2017-07-14 ENCOUNTER — Other Ambulatory Visit (HOSPITAL_COMMUNITY)
Admission: RE | Admit: 2017-07-14 | Discharge: 2017-07-14 | Disposition: A | Discharge status: Home / Self Care | Payer: 59 | Source: Ambulatory Visit | Attending: Nurse Practitioner | Admitting: Nurse Practitioner

## 2017-07-14 DIAGNOSIS — Z124 Encounter for screening for malignant neoplasm of cervix: Secondary | ICD-10-CM | POA: Diagnosis not present

## 2017-07-14 DIAGNOSIS — Z01419 Encounter for gynecological examination (general) (routine) without abnormal findings: Secondary | ICD-10-CM | POA: Diagnosis not present

## 2017-07-14 DIAGNOSIS — R102 Pelvic and perineal pain: Secondary | ICD-10-CM | POA: Diagnosis not present

## 2017-07-14 DIAGNOSIS — B372 Candidiasis of skin and nail: Secondary | ICD-10-CM | POA: Diagnosis not present

## 2017-07-15 ENCOUNTER — Other Ambulatory Visit: Payer: Self-pay | Admitting: Nurse Practitioner

## 2017-07-20 LAB — CYTOLOGY - PAP
Diagnosis: NEGATIVE
HPV: NOT DETECTED

## 2017-07-21 ENCOUNTER — Encounter: Payer: 59 | Attending: Family Medicine | Admitting: Registered"

## 2017-07-21 ENCOUNTER — Encounter: Payer: Self-pay | Admitting: Registered"

## 2017-07-21 DIAGNOSIS — F339 Major depressive disorder, recurrent, unspecified: Secondary | ICD-10-CM | POA: Diagnosis not present

## 2017-07-21 DIAGNOSIS — Z713 Dietary counseling and surveillance: Secondary | ICD-10-CM | POA: Insufficient documentation

## 2017-07-21 MED FILL — VERAPAMIL ER PM 300 MG CAP: 300 | 30 days supply | Qty: 30 | Fill #2

## 2017-07-21 MED FILL — DEXILANT DR 60 MG CAPSULE: 60 | 30 days supply | Qty: 30 | Fill #0

## 2017-07-21 MED FILL — busPIRone HCL 5 MG TABS: 5 | 90 days supply | Qty: 90 | Fill #1

## 2017-07-21 NOTE — Patient Instructions (Signed)
Goals: 2 min snack breaks 30 min lunch breaks Meal prepping, start small Get more movement in as you can

## 2017-07-21 NOTE — Progress Notes (Signed)
Appt start time: 1430 end time:  1530.  [employer] Employee Visit 1  Assessment:  Primary concerns today: Patient states she felt better when she was 50 lbs lighter 2 yrs ago after following the [employer]. Pt states she was doing meal prepping, would take 2 min snack breaks and 30 min lunch breaks at work. Pt states the [employer] also cuts out sugar and other strict rules that made her feel socially isolated. While on this plan Pt reports that after work when she would have the desire to stop at a fast-food restaurant she could talk herself out of it, but now isn't able to resist stopping.   Pt states she has not been able to get back into meal prepping. Pt states she enjoys being creative with cooking, but doesn't enjoy cleaning up. Pt states she avoids salty foods because of effect on her blood pressure. Pt states she is also concerned about the increase in her LFTs.  Pt states she has liked sodas since a child, recently has cut back because of GI issues, having reflux today because she didn't take medicine last night. Pt states it feels like the burning she associates with reflux radiates farther into her stomach as well.   Pt sates when she works weekends she usually eats lunch while working. Pt reports MDs shared with her that taking 10 min to eat fast instead of eating while working is better for them, but Pt reports that is not an appealing idea for her.  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 Pt reports sometimes will go down steps, but is limited because breathing is terrible. Pt states her doctor tells her she need to exercise more to improve this. Pt states she doesn't want to exercise, but does report enjoing working in yard. Pt states she hasn't done much due to hot weather but recently had help getting her garden/yard cleaned up so she can maintain it.  A1c 5.9% per patient. Pt states Dr. Inda Merlin at [facility]  checks every 6 months. Sleep: 7-8 hrs (11pm -6 am) sleep apnea, uses nasal pillow Stress: 7-8 out of 10 Energy: "terrible" 4 out of 10; keeps up at work, but comes home tired, doesn't feel like doing anything.  Preferred Learning Style:   No preference indicated   Learning Readiness:   Ready  MEDICATIONS: reviewed   DIETARY INTAKE:  24-hr recall:  B ( AM): Banana, cheese or Premier Protein, banana OR sweet tea OR 2 oz coke (cutting back) OR cottage cheese, fruit Snk ( AM): coffee 10 am OR kashi bar OR [name] cashew cookies  L ( PM): unsweet tea, 1/2 wrap OR sandwich OR when in office sandwich & fruit Snk ( PM): none D ( PM): coke, fresh market 8-10 oz salmon, squash OR broccoli & asparagus steamable. OR shrimp, rice OR lucky 32 salmon, 2 salads OR creamy grits Snk ( PM): goal is fruit OR small ice cream (120 cal)  Beverages: water, unsweet tea, coke, coffee, occasionally wine or burbon  Usual physical activity: 55-65K steps 2-3x week   Estimated energy needs: 1600 calories 180 g carbohydrates 100 g protein 53 g fat  Progress Towards Goal(s):  New goals.   Nutritional Diagnosis:  NI-5.8.5 Inadeqate fiber intake As related to limited vegetable, bean, nut intake.  As evidenced by dietary recall.    Intervention:  Nutrition Education. Discussed mindful eating and self-care.   Goals:  2 min snack breaks 30 min lunch breaks Meal prepping, start small Get more movement in as you can  Teaching Method Utilized:  Visual Auditory  Handouts given during visit include:  Smoothie recipes  Barriers to learning/adherence to lifestyle change: none  Demonstrated degree of understanding via:  Teach Back   Monitoring/Evaluation:  Dietary intake, exercise, and body weight in 4 week(s).

## 2017-07-27 DIAGNOSIS — F341 Dysthymic disorder: Secondary | ICD-10-CM | POA: Diagnosis not present

## 2017-07-27 DIAGNOSIS — Z0000 Encounter for general adult medical examination without abnormal findings: Secondary | ICD-10-CM | POA: Diagnosis not present

## 2017-07-27 DIAGNOSIS — I10 Essential (primary) hypertension: Secondary | ICD-10-CM | POA: Diagnosis not present

## 2017-07-27 DIAGNOSIS — Z6841 Body Mass Index (BMI) 40.0 and over, adult: Secondary | ICD-10-CM | POA: Diagnosis not present

## 2017-08-04 MED FILL — VALSARTAN 320 MG TAB: 320 | 30 days supply | Qty: 30 | Fill #1

## 2017-08-04 MED FILL — DESVENLAFAXINE SUC ER 50 MG: 50 | 90 days supply | Qty: 90 | Fill #0

## 2017-08-09 DIAGNOSIS — K760 Fatty (change of) liver, not elsewhere classified: Secondary | ICD-10-CM | POA: Diagnosis not present

## 2017-08-09 DIAGNOSIS — R635 Abnormal weight gain: Secondary | ICD-10-CM | POA: Diagnosis not present

## 2017-08-09 DIAGNOSIS — K219 Gastro-esophageal reflux disease without esophagitis: Secondary | ICD-10-CM | POA: Diagnosis not present

## 2017-08-22 ENCOUNTER — Encounter: Payer: 59 | Attending: Family Medicine | Admitting: Registered"

## 2017-08-22 DIAGNOSIS — Z713 Dietary counseling and surveillance: Secondary | ICD-10-CM | POA: Diagnosis not present

## 2017-08-22 NOTE — Progress Notes (Signed)
Appt start time: 1010 end time:  1030.  [facility] Employee Visit 2 of 3  Assessment:  Primary concerns today: Patient states she has been on a 5-day conference where she states there was a lot of sitting during the conference, but when sight-seeing in [place] did a lot of walking. Patient states she is getting ready to go on another trip to [place] in August with some friends.   Pt states she has not worked on the goal of meal planning this month due to being out of town and then had company that wanted to eat out a lot. Pt states she ate a lo of seafood and mostly avoided fried foods because she doesn't feel good after eating them. Patient states she has had some ice cream and has asked for kiddi cone, but states it was still probably too much.   Patient states although she has not started a regular exercise routine, she has started stretching. Pt reports that she is considering returning to a yoga studio that was a very good experience in the past and would have to head straight there after work to make it.   From last visit, didn't update today: A1c 5.9% per patient. Pt states Dr. Inda Merlin at [facility] checks every 6 months. Sleep: 7-8 hrs (11pm -6 am) sleep apnea, uses nasal pillow Stress: 7-8 out of 10 Energy: "terrible" 4 out of 10; keeps up at work, but comes home tired, doesn't feel like doing anything.  Preferred Learning Style:   No preference indicated   Learning Readiness:   Ready  MEDICATIONS: reviewed   DIETARY INTAKE:  24-hr recall: (did not assess this visit. Focused on movement.) B ( AM):  Snk ( AM):   L ( PM):  Snk ( PM):  D ( PM):  Snk ( PM):   Beverages:   Usual physical activity: a lot of walking at when sight-seeing, but mostly sitting in conference  Estimated energy needs: 1600 calories 180 g carbohydrates 100 g protein 53 g fat  Progress Towards Goal(s):  New goals.   Nutritional Diagnosis:  NI-5.8.5 Inadeqate fiber intake As related to limited  vegetable, bean, nut intake.  As evidenced by dietary recall.    Intervention:  Nutrition Education. Discussed role of exercise in fatty liver. Discussed the benefits of all different types of movement and value of exploring different options.   Goals: Consider really focusing on getting more movement and listen to your body to know what intensity.  Consider going back to the yoga class that word well for you.  Teaching Method Utilized:  Visual Auditory  Handouts given during visit include:  none  Barriers to learning/adherence to lifestyle change: none  Demonstrated degree of understanding via:  Teach Back   Monitoring/Evaluation:  Dietary intake, exercise, and body weight in 6 week(s).

## 2017-08-22 NOTE — Patient Instructions (Signed)
Consider really focusing on getting more movement and listen to your body to know what intensity.  Consider going back to the yoga class that word well for you.

## 2017-08-26 MED FILL — DEXILANT DR 60 MG CAPSULE: 60 | 30 days supply | Qty: 30 | Fill #0

## 2017-08-26 MED FILL — VERAPAMIL ER PM 300 MG CAP: 300 | 30 days supply | Qty: 30 | Fill #3

## 2017-08-30 DIAGNOSIS — R102 Pelvic and perineal pain: Secondary | ICD-10-CM | POA: Diagnosis not present

## 2017-08-31 DIAGNOSIS — F339 Major depressive disorder, recurrent, unspecified: Secondary | ICD-10-CM | POA: Diagnosis not present

## 2017-09-02 MED FILL — VALSARTAN 320 MG TAB: 320 | 30 days supply | Qty: 30 | Fill #0

## 2017-09-20 DIAGNOSIS — G4733 Obstructive sleep apnea (adult) (pediatric): Secondary | ICD-10-CM | POA: Diagnosis not present

## 2017-09-23 MED FILL — DEXILANT DR 60 MG CAPSULE: 60 | 30 days supply | Qty: 30 | Fill #1

## 2017-09-23 MED FILL — VERAPAMIL ER PM 300 MG CAP: 300 | 30 days supply | Qty: 30 | Fill #4

## 2017-09-26 MED FILL — DESVENLAFAXINE SUCCINATE ER: 25 | 90 days supply | Qty: 90 | Fill #0

## 2017-09-27 MED FILL — DESVENLAFAXINE SUC ER 50 MG: 50 | 90 days supply | Qty: 90 | Fill #0

## 2017-09-27 MED FILL — VALSARTAN 320 MG TAB: 320 | 30 days supply | Qty: 30 | Fill #1

## 2017-10-07 DIAGNOSIS — C44311 Basal cell carcinoma of skin of nose: Secondary | ICD-10-CM | POA: Diagnosis not present

## 2017-10-10 ENCOUNTER — Encounter: Payer: 59 | Attending: Family Medicine | Admitting: Registered"

## 2017-10-10 DIAGNOSIS — Z713 Dietary counseling and surveillance: Secondary | ICD-10-CM

## 2017-10-10 NOTE — Patient Instructions (Addendum)
After your shoulder is feeling better consider yoga. Continue with your plan to get into the gym after work, starting slow is fine. Consider getting out in nature more often. Consider signing up for non-hunger eating class

## 2017-10-10 NOTE — Progress Notes (Signed)
Appt start time: 1110 end time:  1140.  [facility] Employee Visit 3 of 3  Assessment:  Primary concerns today: Patient states she ate well when in [place] and felt really good. Pt states she really enjoyed all the seafood. Pt states she does not like red meat as much anymore. Pt states she enjoys active vacations and states she walked 10-12K steps first part of trip.   Pt states since she has been back from vacation drinking more coke not sure why. Pt states she is going to see if she can find the tea she was drinking in [place] to replace her coke habit.  Preferred Learning Style:   No preference indicated   Learning Readiness:   Ready  MEDICATIONS: reviewed   DIETARY INTAKE:  24-hr recall: (not a standard day of eating, has not been grocery shopping since back from vacation) B ( AM): P&J sandwich OR [employer] cashew cookie, half/half tea Snk ( AM):  Can of Soup  L ( PM): can of soup, tostados with cheese.  Snk ( PM):  D ( PM): [employer] 4 pc fried chicken (not normal dinner), slaw (instead of fries), biscuit, honest sweet tea with honey Snk ( PM):  none Beverages: water, 20 oz coke, tea  Usual physical activity: ADLs x3 days since back from vacation   Estimated energy needs: 1600 calories 180 g carbohydrates 100 g protein 53 g fat  Progress Towards Goal(s):  New goals.   Nutritional Diagnosis:  NI-5.8.5 Inadeqate fiber intake As related to limited vegetable, bean, nut intake.  As evidenced by dietary recall.    Intervention:  Nutrition Education. Discussed role of exercise/stretching in preventing injuries. Discussed ways to change her normal day-to-day routine to help maintain an active lifestyle.  Goals: After your shoulder is feeling better consider yoga. Continue with your plan to get into the gym after work, starting slow is fine. Consider getting out in nature more often. Consider signing up for non-hunger eating class  Teaching Method Utilized:   Visual Auditory  Handouts given during visit include:  none  Barriers to learning/adherence to lifestyle change: none  Demonstrated degree of understanding via:  Teach Back   Monitoring/Evaluation:  Dietary intake, exercise, and body weight prn.

## 2017-10-19 ENCOUNTER — Other Ambulatory Visit: Payer: Self-pay | Admitting: Gastroenterology

## 2017-10-19 DIAGNOSIS — R1031 Right lower quadrant pain: Secondary | ICD-10-CM

## 2017-10-21 MED FILL — VERAPAMIL ER PM 300 MG CAP: 300 | 30 days supply | Qty: 30 | Fill #5

## 2017-10-21 MED FILL — DEXILANT DR 60 MG CAPSULE: 60 | 30 days supply | Qty: 30 | Fill #2

## 2017-10-25 MED FILL — busPIRone HCL 5 MG TABS: 5 | 90 days supply | Qty: 90 | Fill #0

## 2017-10-28 ENCOUNTER — Ambulatory Visit (HOSPITAL_COMMUNITY)
Admission: RE | Admit: 2017-10-28 | Discharge: 2017-10-28 | Disposition: A | Discharge status: Home / Self Care | Payer: 59 | Source: Ambulatory Visit | Attending: Gastroenterology | Admitting: Gastroenterology

## 2017-10-28 ENCOUNTER — Encounter (HOSPITAL_COMMUNITY): Payer: Self-pay

## 2017-10-28 DIAGNOSIS — K760 Fatty (change of) liver, not elsewhere classified: Secondary | ICD-10-CM | POA: Diagnosis not present

## 2017-10-28 DIAGNOSIS — K5730 Diverticulosis of large intestine without perforation or abscess without bleeding: Secondary | ICD-10-CM | POA: Diagnosis not present

## 2017-10-28 DIAGNOSIS — R1031 Right lower quadrant pain: Secondary | ICD-10-CM | POA: Diagnosis not present

## 2017-10-28 DIAGNOSIS — K449 Diaphragmatic hernia without obstruction or gangrene: Secondary | ICD-10-CM | POA: Diagnosis not present

## 2017-10-28 DIAGNOSIS — D259 Leiomyoma of uterus, unspecified: Secondary | ICD-10-CM | POA: Insufficient documentation

## 2017-10-28 MED ORDER — IOPAMIDOL (ISOVUE-300) INJECTION 61%
100.0000 mL | Freq: Once | INTRAVENOUS | Status: AC | PRN
  Administered 2017-10-28: 100 mL via INTRAVENOUS

## 2017-10-28 MED ORDER — IOPAMIDOL (ISOVUE-300) INJECTION 61%
INTRAVENOUS | Status: AC
  Filled 2017-10-28: qty 100

## 2017-10-31 DIAGNOSIS — E782 Mixed hyperlipidemia: Secondary | ICD-10-CM | POA: Diagnosis not present

## 2017-10-31 DIAGNOSIS — Z6841 Body Mass Index (BMI) 40.0 and over, adult: Secondary | ICD-10-CM | POA: Diagnosis not present

## 2017-10-31 DIAGNOSIS — F341 Dysthymic disorder: Secondary | ICD-10-CM | POA: Diagnosis not present

## 2017-10-31 DIAGNOSIS — I10 Essential (primary) hypertension: Secondary | ICD-10-CM | POA: Diagnosis not present

## 2017-10-31 DIAGNOSIS — M2550 Pain in unspecified joint: Secondary | ICD-10-CM | POA: Diagnosis not present

## 2017-10-31 DIAGNOSIS — R7303 Prediabetes: Secondary | ICD-10-CM | POA: Diagnosis not present

## 2017-10-31 LAB — POCT I-STAT CREATININE: Creatinine, Ser: 0.7 mg/dL (ref 0.44–1.00)

## 2017-11-01 MED FILL — VALSARTAN 320 MG TAB: 320 | 30 days supply | Qty: 30 | Fill #2

## 2017-11-04 MED FILL — METFORMIN HCL ER 500 MG TAB: 500 | 30 days supply | Qty: 30 | Fill #0

## 2017-11-10 MED FILL — glipiZIDE ER 5 MG TB24: 5 | 30 days supply | Qty: 30 | Fill #0

## 2017-11-15 DIAGNOSIS — Z85828 Personal history of other malignant neoplasm of skin: Secondary | ICD-10-CM | POA: Diagnosis not present

## 2017-11-15 DIAGNOSIS — Z08 Encounter for follow-up examination after completed treatment for malignant neoplasm: Secondary | ICD-10-CM | POA: Diagnosis not present

## 2017-11-15 MED FILL — FREESTYLE LANCETS: 90 days supply | Qty: 200 | Fill #0

## 2017-11-15 MED FILL — FREESTYLE LITE TEST STRIP: 90 days supply | Qty: 200 | Fill #0

## 2017-11-15 MED FILL — FREESTYLE LITE METER: 20 days supply | Qty: 1 | Fill #0

## 2017-11-23 ENCOUNTER — Ambulatory Visit (INDEPENDENT_AMBULATORY_CARE_PROVIDER_SITE_OTHER): Payer: 59 | Admitting: Physician Assistant

## 2017-11-23 ENCOUNTER — Encounter: Payer: Self-pay | Admitting: Physician Assistant

## 2017-11-23 DIAGNOSIS — F331 Major depressive disorder, recurrent, moderate: Secondary | ICD-10-CM | POA: Diagnosis not present

## 2017-11-23 DIAGNOSIS — F411 Generalized anxiety disorder: Secondary | ICD-10-CM

## 2017-11-23 DIAGNOSIS — G4733 Obstructive sleep apnea (adult) (pediatric): Secondary | ICD-10-CM

## 2017-11-23 MED FILL — VERAPAMIL ER PM 300 MG CAP: 300 | 90 days supply | Qty: 90 | Fill #0

## 2017-11-23 NOTE — Progress Notes (Signed)
[facility]  Patient ID: Rhonda Aguilar,  MRN: [id number]  PCP: Rhonda Austin, MD  Date of Evaluation: 11/23/2017 Time spent:15 minutes   HISTORY/CURRENT STATUS: HPI  She d/c the Pristiq.  Had worsening of abdominal pain, and GI work-up was negative.  She weaned off it, by 25mg  for several weeks at a time and had no withdrawals.  Has had other w/u and found to have DM.  Her A1C went to the low 7s, from 6.something earlier in the year.  She's now doing weight loss management through [facility].   Increased ocular migraines in the past few months.  Has had to get FMLA for it.   Feels more motivated and has a little more energy since going off the Pristiq.  Does cry easily, "but I've decided it's ok to cry when I need to."  She enjoys doing things with friends and family.  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 Anxiety is pretty well controlled. "(I get anxious when there is something to be anxious about"  Sleeps pretty well most of the time.  Using her CPAP with nasal pillow.  Individual Medical History/ Review of Systems: Changes? :Yes DM, Fatty Liver  Allergies: Codeine and Penicillins  Current Medications:  Current Outpatient Medications:  .  busPIRone (BUSPAR) 5 MG tablet, Take 5 mg by mouth 2 (two) times daily., Disp: , Rfl:  .  cyclobenzaprine (FLEXERIL) 10 MG tablet, Take 10 mg by mouth 3 (three) times daily as needed for muscle spasms., Disp: , Rfl:  .  desvenlafaxine (PRISTIQ) 50 MG 24 hr tablet, Take 50 mg by mouth daily., Disp: , Rfl:  .  dexlansoprazole (DEXILANT) 60 MG capsule, Take 60 mg by mouth daily., Disp: , Rfl:  .  doxycycline (VIBRA-TABS) 100 MG tablet, Take 1 tablet (100 mg total) by mouth 2 (two) times daily., Disp: 20 tablet, Rfl: 0 .  hydrochlorothiazide (MICROZIDE) 12.5 MG capsule, Take 12.5 mg by mouth daily. Pt takes Mon, Wed and Fri., Disp: , Rfl: 1 .  ibuprofen  (ADVIL,MOTRIN) 600 MG tablet, Take 600 mg by mouth every 6 (six) hours as needed for mild pain., Disp: , Rfl:  .  metoCLOPramide (REGLAN) 10 MG tablet, Take 10 mg by mouth as needed for nausea. migraines, Disp: , Rfl:  .  promethazine-phenylephrine (PROMETHAZINE-PHENYLEPHRINE) 6.25-5 MG/5ML SYRP, Take 5 mLs by mouth every 8 (eight) hours as needed for congestion., Disp: 118 mL, Rfl: 0 .  valsartan (DIOVAN) 320 MG tablet, Take 160 mg by mouth daily. , Disp: , Rfl:  .  verapamil (VERELAN PM) 240 MG 24 hr capsule, Take 240 mg by mouth at bedtime., Disp: , Rfl:  Medication Side Effects: None  Family Medical/ Social History: Changes? No  MENTAL HEALTH EXAM:  There were no vitals taken for this visit.There is no height or weight on file to calculate BMI.  General Appearance: Well Groomed obese  Eye Contact:  Good  Speech:  Clear and Coherent  Volume:  Normal  Mood:  Euthymic  Affect:  Appropriate  Thought Process:  Goal Directed  Orientation:  Full (Time, Place, and Person)  Thought Content: Logical   Suicidal Thoughts:  No  Homicidal Thoughts:  No  Memory:  Immediate  Judgement:  Good  Insight:  Good  Psychomotor Activity:  Normal  Concentration:  Concentration: Good  Recall:  Good  Fund of Knowledge: Good  Language: Good  Akathisia:  NA  AIMS (if indicated): not done  Assets:  Communication Skills  ADL's:  Intact  Cognition: WNL  Prognosis:  Good    DIAGNOSES:    ICD-10-CM   1. Major depressive disorder, recurrent episode, moderate ([name]) F33.1   2. Generalized anxiety disorder F41.1     RECOMMENDATIONS: Since she is doing well off the Pristiq we will have her stay off it.  Continue the BuSpar 5 mg p.o. daily, as it seems to be effective for the anxiety. Continue therapeutic lifestyle changes.  Continue weight loss program through home health. Return office visit in 1 year or sooner as needed.    Donnal Moat, PA-C

## 2017-11-24 ENCOUNTER — Encounter (INDEPENDENT_AMBULATORY_CARE_PROVIDER_SITE_OTHER): Payer: Self-pay

## 2017-11-24 MED FILL — DEXILANT DR 60 MG CAPSULE: 60 | 30 days supply | Qty: 30 | Fill #3

## 2017-11-30 MED FILL — VALSARTAN 320 MG TAB: 320 | 30 days supply | Qty: 30 | Fill #3

## 2017-12-06 DIAGNOSIS — G4733 Obstructive sleep apnea (adult) (pediatric): Secondary | ICD-10-CM | POA: Diagnosis not present

## 2017-12-08 ENCOUNTER — Encounter (INDEPENDENT_AMBULATORY_CARE_PROVIDER_SITE_OTHER): Payer: Self-pay | Admitting: Bariatrics

## 2017-12-08 ENCOUNTER — Ambulatory Visit (INDEPENDENT_AMBULATORY_CARE_PROVIDER_SITE_OTHER): Payer: 59 | Admitting: Bariatrics

## 2017-12-08 VITALS — BP 136/83 | HR 114 | Temp 97.9°F | Ht 62.0 in | Wt 231.0 lb

## 2017-12-08 DIAGNOSIS — Z1331 Encounter for screening for depression: Secondary | ICD-10-CM

## 2017-12-08 DIAGNOSIS — G43109 Migraine with aura, not intractable, without status migrainosus: Secondary | ICD-10-CM | POA: Diagnosis not present

## 2017-12-08 DIAGNOSIS — G4733 Obstructive sleep apnea (adult) (pediatric): Secondary | ICD-10-CM | POA: Diagnosis not present

## 2017-12-08 DIAGNOSIS — R0602 Shortness of breath: Secondary | ICD-10-CM

## 2017-12-08 DIAGNOSIS — Z6841 Body Mass Index (BMI) 40.0 and over, adult: Secondary | ICD-10-CM

## 2017-12-08 DIAGNOSIS — I10 Essential (primary) hypertension: Secondary | ICD-10-CM | POA: Diagnosis not present

## 2017-12-08 DIAGNOSIS — Z9189 Other specified personal risk factors, not elsewhere classified: Secondary | ICD-10-CM

## 2017-12-08 DIAGNOSIS — E66813 Obesity, class 3: Secondary | ICD-10-CM

## 2017-12-08 DIAGNOSIS — R5383 Other fatigue: Secondary | ICD-10-CM

## 2017-12-08 DIAGNOSIS — Z9989 Dependence on other enabling machines and devices: Secondary | ICD-10-CM | POA: Insufficient documentation

## 2017-12-08 DIAGNOSIS — E119 Type 2 diabetes mellitus without complications: Secondary | ICD-10-CM

## 2017-12-08 DIAGNOSIS — Z0289 Encounter for other administrative examinations: Secondary | ICD-10-CM

## 2017-12-09 LAB — COMPREHENSIVE METABOLIC PANEL
A/G RATIO: 1.9 (ref 1.2–2.2)
ALK PHOS: 86 IU/L (ref 39–117)
ALT: 118 IU/L — ABNORMAL HIGH (ref 0–32)
AST: 90 IU/L — AB (ref 0–40)
Albumin: 4.4 g/dL (ref 3.6–4.8)
BILIRUBIN TOTAL: 0.5 mg/dL (ref 0.0–1.2)
BUN/Creatinine Ratio: 23 (ref 12–28)
BUN: 17 mg/dL (ref 8–27)
CHLORIDE: 101 mmol/L (ref 96–106)
CO2: 18 mmol/L — ABNORMAL LOW (ref 20–29)
Calcium: 9.2 mg/dL (ref 8.7–10.3)
Creatinine, Ser: 0.73 mg/dL (ref 0.57–1.00)
GFR calc Af Amer: 101 mL/min/{1.73_m2} (ref >59)
GFR calc non Af Amer: 87 mL/min/{1.73_m2} (ref >59)
Globulin, Total: 2.3 g/dL (ref 1.5–4.5)
Glucose: 122 mg/dL — ABNORMAL HIGH (ref 65–99)
POTASSIUM: 4.1 mmol/L (ref 3.5–5.2)
Sodium: 138 mmol/L (ref 134–144)
Total Protein: 6.7 g/dL (ref 6.0–8.5)

## 2017-12-09 LAB — CBC WITH DIFFERENTIAL
BASOS ABS: 0 10*3/uL (ref 0.0–0.2)
Basos: 1 %
EOS (ABSOLUTE): 0.2 10*3/uL (ref 0.0–0.4)
Eos: 2 %
Hematocrit: 38.1 % (ref 34.0–46.6)
Hemoglobin: 12.9 g/dL (ref 11.1–15.9)
Immature Grans (Abs): 0 10*3/uL (ref 0.0–0.1)
Immature Granulocytes: 1 %
LYMPHS: 29 %
Lymphocytes Absolute: 2.5 10*3/uL (ref 0.7–3.1)
MCH: 27.8 pg (ref 26.6–33.0)
MCHC: 33.9 g/dL (ref 31.5–35.7)
MCV: 82 fL (ref 79–97)
MONOCYTES: 6 %
Monocytes Absolute: 0.5 10*3/uL (ref 0.1–0.9)
NEUTROS PCT: 61 %
Neutrophils Absolute: 5.2 10*3/uL (ref 1.4–7.0)
RBC: 4.64 x10E6/uL (ref 3.77–5.28)
RDW: 14 % (ref 12.3–15.4)
WBC: 8.5 10*3/uL (ref 3.4–10.8)

## 2017-12-09 LAB — LIPID PANEL WITH LDL/HDL RATIO
Cholesterol, Total: 197 mg/dL (ref 100–199)
HDL: 42 mg/dL (ref >39)
LDL Calculated: 125 mg/dL — ABNORMAL HIGH (ref 0–99)
LDl/HDL Ratio: 3 ratio (ref 0.0–3.2)
TRIGLYCERIDES: 148 mg/dL (ref 0–149)
VLDL Cholesterol Cal: 30 mg/dL (ref 5–40)

## 2017-12-09 LAB — T3: T3, Total: 132 ng/dL (ref 71–180)

## 2017-12-09 LAB — VITAMIN D 25 HYDROXY (VIT D DEFICIENCY, FRACTURES): Vit D, 25-Hydroxy: 14.4 ng/mL — ABNORMAL LOW (ref 30.0–100.0)

## 2017-12-09 LAB — INSULIN, RANDOM: INSULIN: 38.2 u[IU]/mL — AB (ref 2.6–24.9)

## 2017-12-09 LAB — TSH: TSH: 2.55 u[IU]/mL (ref 0.450–4.500)

## 2017-12-09 LAB — T4, FREE: Free T4: 1.24 ng/dL (ref 0.82–1.77)

## 2017-12-12 DIAGNOSIS — Z6841 Body Mass Index (BMI) 40.0 and over, adult: Secondary | ICD-10-CM

## 2017-12-12 DIAGNOSIS — G43109 Migraine with aura, not intractable, without status migrainosus: Secondary | ICD-10-CM | POA: Insufficient documentation

## 2017-12-12 NOTE — Progress Notes (Signed)
.  Office: [phone]  /  Fax: [phone]   HPI:   Chief Complaint: OBESITY  Rhonda Aguilar (MR# [id number]) is a 62 y.o. female who presents on 12/12/2017 for obesity evaluation and treatment. Current BMI is Body mass index is 42.25 kg/m.Rhonda Aguilar has struggled with obesity for years and has been unsuccessful in either losing weight or maintaining long term weight loss. [name] attended our information session and states she is currently in the action stage of change and ready to dedicate time achieving and maintaining a healthier weight.  [name] states her desired weight loss is 63 lbs she started gaining weight after marriage her heaviest weight ever was 235 lbs. she has significant food cravings issues  she snacks frequently at night and after dinner she stopped drinking cola the 1st of September 2019 she has problems with excessive hunger  she struggles with emotional eating    Fatigue [name] feels her energy is lower than it should be. This has worsened with weight gain and has not worsened recently. [name] admits to daytime somnolence and admits to waking up still tired, but most of the time she wakes up refreshed. Patient has a history of obstructive sleep apnea, which may contribute to her fatigue. She is wearing CPAP nightly. Patent has a history of symptoms of daytime fatigue, morning headache and hypertension. Patient generally gets 7 hours of sleep per night, and states they generally have restful sleep. Snoring is present. Apneic episodes are present. Epworth Sleepiness Score is 6  Dyspnea on exertion Rhonda Aguilar notes increasing shortness of breath with exercising and seems to be worsening over time with weight gain. She notes getting out of breath sooner with activity than she used to. This has not gotten worse recently. Rhonda Aguilar denies chest pain or orthopnea.  Diabetes II Rhonda Aguilar has a diagnosis of diabetes type II. She is currently taking Glipizide. Rhonda Aguilar states fasting BGs range  between 118 and 165, post prandial BGs range between 115 and 231 and she denies any hypoglycemic episodes. Last A1c was at 7.3 (9/10/119) She has been working on intensive lifestyle modifications including diet, exercise, and weight loss to help control her blood glucose levels.  OSA (obstructive sleep apnea) with CPAP Rhonda Aguilar has a history of obstructive sleep apnea and she is wearing her CPAP nightly.  Hypertension Rhonda Aguilar is a 62 y.o. female with hypertension. She is currently taking Verapamil.  Rhonda Aguilar denies lightheadedness. She is working weight loss to help control her blood pressure with the goal of decreasing her risk of heart attack and stroke. [name] blood pressure is reasonably well controlled.  At risk for cardiovascular disease Rhonda Aguilar is at a higher than average risk for cardiovascular disease due to obesity, diabetes, OSA and hypertension. She currently denies any chest pain.  Migraines (ocular) Rhonda Aguilar has a history of migraine headaches and she thinks metformin worsened her headaches. She is not seeing a neurologist.  Depression Screen Rhonda Aguilar's Food and Mood (modified PHQ-9) score was  Depression screen PHQ 2/9 12/08/2017  Decreased Interest 2  Down, Depressed, Hopeless 1  PHQ - 2 Score 3  Altered sleeping 1  Tired, decreased energy 2  Change in appetite 1  Feeling bad or failure about yourself  0  Trouble concentrating 1  Moving slowly or fidgety/restless 1  Suicidal thoughts 0  PHQ-9 Score 9  Difficult doing work/chores Not difficult at all    ALLERGIES: Allergies  Allergen Reactions  . Codeine Itching and Rash  . Penicillins Rash  MEDICATIONS: Current Outpatient Medications on File Prior to Visit  Medication Sig Dispense Refill  . busPIRone (BUSPAR) 5 MG tablet Take 5 mg by mouth 2 (two) times daily.    . cyclobenzaprine (FLEXERIL) 10 MG tablet Take 10 mg by mouth 3 (three) times daily as needed for muscle spasms.    Rhonda Kitchen dexlansoprazole  (DEXILANT) 60 MG capsule Take 60 mg by mouth daily.    Rhonda Kitchen glipiZIDE (GLUCOTROL) 5 MG tablet Take by mouth daily before breakfast.    . hydrochlorothiazide (MICROZIDE) 12.5 MG capsule Take 12.5 mg by mouth daily. Pt takes Mon, [place] and Fri.  1  . ibuprofen (ADVIL,MOTRIN) 600 MG tablet Take 600 mg by mouth every 6 (six) hours as needed for mild pain.    Rhonda Kitchen metoCLOPramide (REGLAN) 10 MG tablet Take 10 mg by mouth as needed for nausea. migraines    . Peppermint Oil (IBGARD PO) Take by mouth.    . Probiotic Product (ALIGN) 4 MG CAPS Take by mouth.    . valsartan (DIOVAN) 320 MG tablet Take 160 mg by mouth daily.     . verapamil (VERELAN PM) 240 MG 24 hr capsule Take 240 mg by mouth at bedtime.     No current facility-administered medications on file prior to visit.     PAST MEDICAL HISTORY: Past Medical History:  Diagnosis Date  . Benign essential HTN 11/22/2014  . Diabetes mellitus, type II (Rhonda Aguilar)   . Fatty liver   . GERD (gastroesophageal reflux disease)   . Hypertension   . IBS (irritable bowel syndrome)   . Migraine   . Obesity (BMI 30-39.9) 11/22/2014  . OSA (obstructive sleep apnea)     PAST SURGICAL HISTORY: Past Surgical History:  Procedure Laterality Date  . TONSILLECTOMY      SOCIAL HISTORY: Social History   Tobacco Use  . Smoking status: Never Smoker  . Smokeless tobacco: Never Used  Substance Use Topics  . Alcohol use: Yes    Alcohol/week: 0.0 standard drinks  . Drug use: Not on file    FAMILY HISTORY: Family History  Problem Relation Age of Onset  . Diabetes Mother   . Heart disease Mother   . Hypertension Mother   . High Cholesterol Mother   . Kidney disease Mother   . Depression Mother   . Obesity Mother   . Coronary artery disease Father   . High blood pressure Father   . High Cholesterol Father   . Depression Father   . Hypertension Brother     ROS: Review of Systems  Constitutional: Positive for malaise/fatigue.  Respiratory: Positive for  shortness of breath (on exertion).   Cardiovascular: Negative for chest pain and orthopnea.  Neurological: Positive for headaches.       Negative for lightheadedness  Endo/Heme/Allergies:       Positive for hyperglycemia Negative for hypoglycemia    PHYSICAL EXAM: Blood pressure 136/83, pulse (!) 114, temperature 97.9 F (36.6 C), temperature source Oral, height 5\' 2"  (1.575 m), weight 231 lb (104.8 kg), SpO2 99 %. Body mass index is 42.25 kg/m. Physical Exam  Constitutional: She is oriented to person, place, and time. She appears well-developed and well-nourished.  HENT:  Head: Normocephalic and atraumatic.  Nose: Nose normal.  M[place]anpati = 2  Eyes: EOM are normal. No scleral icterus.  Neck: Normal range of motion. Neck supple. No thyromegaly present.  Cardiovascular: Normal rate and regular rhythm.  Pulmonary/Chest: No respiratory distress.  Abdominal: Soft. There is no tenderness.  +  Obesity  Musculoskeletal: Normal range of motion. She exhibits edema (trace edema bilateral lower extremities).  Range of Motion normal in all 4 extremities  Neurological: She is alert and oriented to person, place, and time. Coordination normal.  Skin: Skin is warm and dry.  Psychiatric: She has a normal mood and affect.  Vitals reviewed.   RECENT LABS AND TESTS: BMET    Component Value Date/Time   NA 138 12/08/2017 1306   K 4.1 12/08/2017 1306   CL 101 12/08/2017 1306   CO2 18 (L) 12/08/2017 1306   GLUCOSE 122 (H) 12/08/2017 1306   BUN 17 12/08/2017 1306   CREATININE 0.73 12/08/2017 1306   CALCIUM 9.2 12/08/2017 1306   GFRNONAA 87 12/08/2017 1306   GFRAA 101 12/08/2017 1306   No results found for: HGBA1C Lab Results  Component Value Date   INSULIN 38.2 (H) 12/08/2017   CBC    Component Value Date/Time   WBC 8.5 12/08/2017 1306   WBC 16.3 (H) 12/19/2014 1120   RBC 4.64 12/08/2017 1306   RBC 4.92 12/19/2014 1120   HGB 12.9 12/08/2017 1306   HCT 38.1 12/08/2017 1306   PLT  263 12/19/2014 1120   MCV 82 12/08/2017 1306   MCH 27.8 12/08/2017 1306   MCH 27.8 12/19/2014 1120   MCHC 33.9 12/08/2017 1306   MCHC 33.5 12/19/2014 1120   RDW 14.0 12/08/2017 1306   LYMPHSABS 2.5 12/08/2017 1306   MONOABS 1.5 (H) 12/19/2014 1120   EOSABS 0.2 12/08/2017 1306   BASOSABS 0.0 12/08/2017 1306   Iron/TIBC/Ferritin/ %Sat No results found for: IRON, TIBC, FERRITIN, IRONPCTSAT Lipid Panel     Component Value Date/Time   CHOL 197 12/08/2017 1306   TRIG 148 12/08/2017 1306   HDL 42 12/08/2017 1306   LDLCALC 125 (H) 12/08/2017 1306   Hepatic Function Panel     Component Value Date/Time   PROT 6.7 12/08/2017 1306   ALBUMIN 4.4 12/08/2017 1306   AST 90 (H) 12/08/2017 1306   ALT 118 (H) 12/08/2017 1306   ALKPHOS 86 12/08/2017 1306   BILITOT 0.5 12/08/2017 1306      Component Value Date/Time   TSH 2.550 12/08/2017 1306   Vitamin D There are no recent lab results  ECG  shows NSR with a rate of 78 BPM INDIRECT CALORIMETER done today shows a VO2 of 281 and a REE of 1962. Her calculated basal metabolic rate is 6606 thus her basal metabolic rate is better than expected.    ASSESSMENT AND PLAN: Other fatigue - Plan: EKG 12-Lead, CBC With Differential, VITAMIN D 25 Hydroxy (Vit-D Deficiency, Fractures), T3, T4, free, TSH  Shortness of breath on exertion - Plan: Lipid Panel With LDL/HDL Ratio  Type 2 diabetes mellitus without complication, without long-term current use of insulin ([name]) - Plan: Comprehensive metabolic panel, Insulin, random  OSA on CPAP  Essential hypertension - Plan: Comprehensive metabolic panel  Ocular migraine  Depression screening  At risk for heart disease  Class 3 severe obesity with serious comorbidity and body mass index (BMI) of 40.0 to 44.9 in adult, unspecified obesity type ([name])  PLAN:  Fatigue Meeya was informed that her fatigue may be related to obesity, depression or many other causes. Labs will be ordered, and in the  meanwhile Trenace has agreed to work on diet and weight loss and will slowly increase exercise to help with fatigue. Proper sleep hygiene was discussed including the need for 7-8 hours of quality sleep each night. A sleep study  was not ordered based on symptoms and Epworth score.  Dyspnea on exertion Tonnette's shortness of breath appears to be obesity related and exercise induced. She has agreed to work on weight loss and slowly increase exercise to treat her exercise induced shortness of breath. If Dhani follows our instructions and loses weight without improvement of her shortness of breath, we will plan to refer to pulmonology. We will monitor this condition regularly. Aleanna agrees to this plan.  Diabetes II Sherida has been given extensive diabetes education by myself today including ideal fasting and post-prandial blood glucose readings, individual ideal Hgb A1c goals and hypoglycemia prevention. We discussed the importance of good blood sugar control to decrease the likelihood of diabetic complications such as nephropathy, neuropathy, limb loss, blindness, coronary artery disease, and death. We discussed the importance of intensive lifestyle modification including diet, exercise and weight loss as the first line treatment for diabetes. Xitlaly will continue to track her blood sugar (fasting and 2 hour post prandial) Chinyere agrees to continue to take her diabetes medications and we may consider other medications in the future. Filippa will follow up at the agreed upon time.  OSA (obstructive sleep apnea) with CPAP Bianney will continue to wear her CPAP and will follow up with our clinic at the agreed upon time.  Hypertension We discussed sodium restriction, working on healthy weight loss, and a regular exercise program as the means to achieve improved blood pressure control. Ryiah agreed with this plan and agreed to follow up as directed. We will continue to monitor her blood pressure as well as her progress  with the above lifestyle modifications. She will continue her medications as prescribed and will watch for signs of hypotension as she continues her lifestyle modifications.  Cardiovascular risk counseling Franchesca was given extended (15 minutes) coronary artery disease prevention counseling today. She is 62 y.o. female and has risk factors for heart disease including obesity, diabetes, OSA and hypertension. We discussed intensive lifestyle modifications today with an emphasis on specific weight loss instructions and strategies. Pt was also informed of the importance of increasing exercise and decreasing saturated fats to help prevent heart disease.  Migraines (ocular) Tmya will follow up with her PCP. We may challenge with metformin in the future. Cami agrees to follow up with our clinic in 2 weeks.  Depression Screen Lenora had a mildly positive depression screening. Depression is commonly associated with obesity and often results in emotional eating behaviors. We will monitor this closely and work on CBT to help improve the non-hunger eating patterns. Referral to Psychology may be required if no improvement is seen as she continues in our clinic.  Obesity Millissa is currently in the action stage of change and her goal is to continue with weight loss efforts She has agreed to follow the Category 3 plan Kristien has been instructed to work up to a goal of 150 minutes of combined cardio and strengthening exercise per week for weight loss and overall health benefits. We discussed the following Behavioral Modification Strategies today: increase H2O intake, no skipping meals, better snacking choices, keeping healthy foods in the home, increasing lean protein intake, decreasing simple carbohydrates , increasing vegetables, decrease eating out, work on meal planning and easy cooking plans and decrease liquid calories  Muslima has agreed to follow up with our clinic in 2 weeks. She was informed of the importance  of frequent follow up visits to maximize her success with intensive lifestyle modifications for her multiple health conditions. She was informed  we would discuss her lab results at her next visit unless there is a critical issue that needs to be addressed sooner. Yamileth agreed to keep her next visit at the agreed upon time to discuss these results.    OBESITY BEHAVIORAL INTERVENTION VISIT  Today's visit was # 1   Starting weight: 231 lbs Starting date: 12/08/17 Today's weight : 231 lbs  Today's date: 12/08/2017 Total lbs lost to date: 0 At least 15 minutes were spent on discussing the following behavioral intervention visit.   ASK: We discussed the diagnosis of obesity with Isaiah Serge today and Dorota agreed to give [name] permission to discuss obesity behavioral modification therapy today.  ASSESS: Christabel has the diagnosis of obesity and her BMI today is 42.24 Dollie is in the action stage of change   ADVISE: Chana was educated on the multiple health risks of obesity as well as the benefit of weight loss to improve her health. She was advised of the need for long term treatment and the importance of lifestyle modifications to improve her current health and to decrease her risk of future health problems.  AGREE: Multiple dietary modification options and treatment options were discussed and  Lovette agreed to follow the recommendations documented in the above note.  ARRANGE: Imane was educated on the importance of frequent visits to treat obesity as outlined per CMS and USPSTF guidelines and agreed to schedule her next follow up appointment today.   Corey Skains, am acting as Location manager for [employer]. Owens Shark, DO  I have reviewed the above documentation for accuracy and completeness, and I agree with the above. -Jearld Lesch, DO

## 2017-12-13 ENCOUNTER — Encounter (INDEPENDENT_AMBULATORY_CARE_PROVIDER_SITE_OTHER): Payer: Self-pay | Admitting: Bariatrics

## 2017-12-15 ENCOUNTER — Encounter (INDEPENDENT_AMBULATORY_CARE_PROVIDER_SITE_OTHER): Payer: Self-pay

## 2017-12-20 ENCOUNTER — Ambulatory Visit (INDEPENDENT_AMBULATORY_CARE_PROVIDER_SITE_OTHER): Payer: 59 | Admitting: Bariatrics

## 2017-12-20 VITALS — BP 131/73 | HR 82 | Temp 97.6°F | Ht 62.0 in | Wt 229.0 lb

## 2017-12-20 DIAGNOSIS — Z9189 Other specified personal risk factors, not elsewhere classified: Secondary | ICD-10-CM | POA: Diagnosis not present

## 2017-12-20 DIAGNOSIS — I10 Essential (primary) hypertension: Secondary | ICD-10-CM

## 2017-12-20 DIAGNOSIS — E559 Vitamin D deficiency, unspecified: Secondary | ICD-10-CM | POA: Diagnosis not present

## 2017-12-20 DIAGNOSIS — R748 Abnormal levels of other serum enzymes: Secondary | ICD-10-CM

## 2017-12-20 DIAGNOSIS — G4733 Obstructive sleep apnea (adult) (pediatric): Secondary | ICD-10-CM | POA: Diagnosis not present

## 2017-12-20 DIAGNOSIS — Z6841 Body Mass Index (BMI) 40.0 and over, adult: Secondary | ICD-10-CM | POA: Diagnosis not present

## 2017-12-20 DIAGNOSIS — E119 Type 2 diabetes mellitus without complications: Secondary | ICD-10-CM | POA: Diagnosis not present

## 2017-12-20 MED ORDER — VITAMIN D (ERGOCALCIFEROL) 1.25 MG (50000 UNIT) PO CAPS: 50000.0000 [IU] | ORAL_CAPSULE | ORAL | 0 refills | Status: DC

## 2017-12-20 MED FILL — HYDROCHLOROTHIAZIDE 12.5 MG: 12.5 | 30 days supply | Qty: 30 | Fill #0

## 2017-12-20 MED FILL — VIT D2 1.25 MG (50,000 UNIT: 1.25 MG | 28 days supply | Qty: 4 | Fill #0

## 2017-12-21 NOTE — Progress Notes (Signed)
Office: [phone]  /  Fax: [phone]   HPI:   Chief Complaint: OBESITY Rhonda Aguilar is here to discuss her progress with her obesity treatment plan. She is following the Category 3 + 300 plan and is following her eating plan approximately 98 % of the time. She states she is exercising 0 minutes 0 times per week. [name] says that she is sometimes not able to get in the 300 extra calories. She has switched her protein intake around for different meals. She denies any polyphagia.  Her weight is 229 lb (103.9 kg) today and has had a weight loss of 2 pounds over a period of 2 weeks since her last visit. She has lost 2 lbs since starting treatment with [name].  Diabetes II Rhonda Aguilar has a diagnosis of diabetes type II. Charlcie states BGs range between 120 and 160 and 1-2 hours postprandial: 115, 170s, 200. Novalynn denies any hypoglycemic episodes.  Last A1c was 7.3%.Rhonda Aguilar is taking Glipizide but she stopped the Glucotrol.  She has been working on intensive lifestyle modifications including diet, exercise, and weight loss to help control her blood glucose levels.  Obstructive Sleep Apnea Rhonda Aguilar has a diagnosis of obstructive sleep apnea. We discussed today the problems that can be associated with this disease and she is wearing her CPAP nightly.   Hypertension [name] is a 62 y.o. female with hypertension.  [name] denies chest pain, lightheadedness or shortness of breath on exertion. She is taking Valsartan and Verapamil. She is working weight loss to help control her blood pressure with the goal of decreasing her risk of heart attack and stroke. [name] blood pressure is well controlled.  Vitamin D deficiency Rhonda Aguilar has a diagnosis of vitamin D deficiency. She is not currently taking vit D and denies nausea, vomiting or muscle weakness.  Elevated Liver Enzymes Rhonda Aguilar has had elevated liver test off and on for 10 years. She has had a CT/Angiogram of abdomen with a diagnosis of fatty liver.       Component Value Date/Time   PROT 6.7 12/08/2017 1306   ALBUMIN 4.4 12/08/2017 1306   AST 90 (H) 12/08/2017 1306   ALT 118 (H) 12/08/2017 1306   ALKPHOS 86 12/08/2017 1306   BILITOT 0.5 12/08/2017 1306     ALLERGIES: Allergies  Allergen Reactions  . Codeine Itching and Rash  . Penicillins Rash    MEDICATIONS: Current Outpatient Medications on File Prior to Visit  Medication Sig Dispense Refill  . busPIRone (BUSPAR) 5 MG tablet Take 5 mg by mouth 2 (two) times daily.    . cyclobenzaprine (FLEXERIL) 10 MG tablet Take 10 mg by mouth 3 (three) times daily as needed for muscle spasms.    [name] dexlansoprazole (DEXILANT) 60 MG capsule Take 60 mg by mouth daily.    [name] glipiZIDE (GLUCOTROL) 5 MG tablet Take by mouth daily before breakfast.    . hydrochlorothiazide (MICROZIDE) 12.5 MG capsule Take 12.5 mg by mouth daily. Pt takes Mon, [place] and Fri.  1  . ibuprofen (ADVIL,MOTRIN) 600 MG tablet Take 600 mg by mouth every 6 (six) hours as needed for mild pain.    [name] metoCLOPramide (REGLAN) 10 MG tablet Take 10 mg by mouth as needed for nausea. migraines    . Peppermint Oil (IBGARD PO) Take by mouth.    . Probiotic Product (ALIGN) 4 MG CAPS Take by mouth.    . valsartan (DIOVAN) 320 MG tablet Take 160 mg by mouth daily.     [name]  verapamil (VERELAN PM) 240 MG 24 hr capsule Take 240 mg by mouth at bedtime.     No current facility-administered medications on file prior to visit.     PAST MEDICAL HISTORY: Past Medical History:  Diagnosis Date  . Benign essential HTN 11/22/2014  . Diabetes mellitus, type II ([place])   . Fatty liver   . GERD (gastroesophageal reflux disease)   . Hypertension   . IBS (irritable bowel syndrome)   . Migraine   . Obesity (BMI 30-39.9) 11/22/2014  . OSA (obstructive sleep apnea)     PAST SURGICAL HISTORY: Past Surgical History:  Procedure Laterality Date  . TONSILLECTOMY      SOCIAL HISTORY: Social History   Tobacco Use  . Smoking status: Never Smoker  .  Smokeless tobacco: Never Used  Substance Use Topics  . Alcohol use: Yes    Alcohol/week: 0.0 standard drinks  . Drug use: Not on file    FAMILY HISTORY: Family History  Problem Relation Age of Onset  . Diabetes Mother   . Heart disease Mother   . Hypertension Mother   . High Cholesterol Mother   . Kidney disease Mother   . Depression Mother   . Obesity Mother   . Coronary artery disease Father   . High blood pressure Father   . High Cholesterol Father   . Depression Father   . Hypertension Brother     ROS: Review of Systems  Constitutional: Positive for weight loss.  Respiratory: Negative for shortness of breath.   Cardiovascular: Negative for chest pain.  Gastrointestinal: Negative for nausea and vomiting.  Musculoskeletal:       Negative muscle weakness.  Neurological:       Negative for lightheadedness  Endo/Heme/Allergies:       Negative for polyphagia Negative for hypoglycemia     PHYSICAL EXAM: Blood pressure 131/73, pulse 82, temperature 97.6 F (36.4 C), temperature source Oral, height 5\' 2"  (1.575 m), weight 229 lb (103.9 kg), SpO2 97 %. Body mass index is 41.88 kg/m. Physical Exam  Constitutional: She is oriented to person, place, and time. She appears well-developed and well-nourished.  Cardiovascular: Normal rate.  Pulmonary/Chest: Effort normal.  Musculoskeletal: Normal range of motion.  Neurological: She is alert and oriented to person, place, and time.  Skin: Skin is warm and dry.  Psychiatric: She has a normal mood and affect. Her behavior is normal.  Vitals reviewed.   RECENT LABS AND TESTS: BMET    Component Value Date/Time   NA 138 12/08/2017 1306   K 4.1 12/08/2017 1306   CL 101 12/08/2017 1306   CO2 18 (L) 12/08/2017 1306   GLUCOSE 122 (H) 12/08/2017 1306   BUN 17 12/08/2017 1306   CREATININE 0.73 12/08/2017 1306   CALCIUM 9.2 12/08/2017 1306   GFRNONAA 87 12/08/2017 1306   GFRAA 101 12/08/2017 1306   No results found for:  HGBA1C Lab Results  Component Value Date   INSULIN 38.2 (H) 12/08/2017   CBC    Component Value Date/Time   WBC 8.5 12/08/2017 1306   WBC 16.3 (H) 12/19/2014 1120   RBC 4.64 12/08/2017 1306   RBC 4.92 12/19/2014 1120   HGB 12.9 12/08/2017 1306   HCT 38.1 12/08/2017 1306   PLT 263 12/19/2014 1120   MCV 82 12/08/2017 1306   MCH 27.8 12/08/2017 1306   MCH 27.8 12/19/2014 1120   MCHC 33.9 12/08/2017 1306   MCHC 33.5 12/19/2014 1120   RDW 14.0 12/08/2017 1306  LYMPHSABS 2.5 12/08/2017 1306   MONOABS 1.5 (H) 12/19/2014 1120   EOSABS 0.2 12/08/2017 1306   BASOSABS 0.0 12/08/2017 1306   Iron/TIBC/Ferritin/ %Sat No results found for: IRON, TIBC, FERRITIN, IRONPCTSAT Lipid Panel     Component Value Date/Time   CHOL 197 12/08/2017 1306   TRIG 148 12/08/2017 1306   HDL 42 12/08/2017 1306   LDLCALC 125 (H) 12/08/2017 1306   Hepatic Function Panel     Component Value Date/Time   PROT 6.7 12/08/2017 1306   ALBUMIN 4.4 12/08/2017 1306   AST 90 (H) 12/08/2017 1306   ALT 118 (H) 12/08/2017 1306   ALKPHOS 86 12/08/2017 1306   BILITOT 0.5 12/08/2017 1306      Component Value Date/Time   TSH 2.550 12/08/2017 1306   Results for CORREEN, BUBOLZ (MRN [id number]) as of 12/21/2017 15:11  Ref. Range 12/08/2017 13:06  Vitamin D, 25-Hydroxy Latest Ref Range: 30.0 - 100.0 ng/mL 14.4 (L)   ASSESSMENT AND PLAN: Type 2 diabetes mellitus without complication, without long-term current use of insulin ([name])  OSA (obstructive sleep apnea)  Essential hypertension  Vitamin D deficiency - Plan: Vitamin D, Ergocalciferol, (DRISDOL) 50000 units CAPS capsule  Elevated liver enzymes  At risk for osteoporosis  Class 3 severe obesity with serious comorbidity and body mass index (BMI) of 40.0 to 44.9 in adult, unspecified obesity type ([place])  PLAN: Diabetes II Yvett has been given extensive diabetes education by myself today including ideal fasting and post-prandial blood glucose readings,  individual ideal HgA1c goals  and hypoglycemia prevention. We discussed the importance of good blood sugar control to decrease the likelihood of diabetic complications such as nephropathy, neuropathy, limb loss, blindness, coronary artery disease, and death. We discussed the importance of intensive lifestyle modification including diet, exercise and weight loss as the first line treatment for diabetes. She will remain off of her Glipizide but will continue her other medications as prescribed and will watch for signs of hypotension as she continues her lifestyle modifications. She may reconsider Metformin but she wants to discuss this with her PCP first. Feven agrees to follow up in our office in 2 weeks  Obstructive Sleep Apnea Latonia agrees to continue using her CPAP machine and will notify us if she has any symptoms to arise from her OSA.  Lexandra agrees to follow up with our office in 2 weeks.   Hypertension We discussed sodium restriction, working on healthy weight loss, and a regular exercise program as the means to achieve improved blood pressure control. Marithza agreed with this plan and agreed to follow up as directed. We will continue to monitor her blood pressure as well as her progress with the above lifestyle modifications. Adler agrees to continue her antihypertensive medications. Lenzie agrees to follow up with our office in 2 weeks.   Vitamin D Deficiency Kaely was informed that low vitamin D levels contributes to fatigue and are associated with obesity, breast, and colon cancer. She agrees to start taking prescription Vit D @50 ,000 IU every week #4 with no refills. Joey will follow up for routine testing of vitamin D, at least 2-3 times per year. She was informed of the risk of over-replacement of vitamin D and agrees to not increase her dose unless she discusses this with [name] first. Saida agrees to follow up with our office in 2 weeks.   Elevated Liver Enzymes Shephanie was educated on  complications that can result from fatty liver disease and elevated liver enzymes. Kendal will follow up  with her PCP regarding the fatty liver. Shanira agrees to follow up in our office in 2 weeks.  Obesity Rim is currently in the action stage of change. As such, her goal is to continue with weight loss efforts She has agreed to follow the Category 3 plan + 300 Krystale has been instructed to work up to a goal of 150 minutes of combined cardio and strengthening exercise per week for weight loss and overall health benefits. We discussed the following Behavioral Modification Stratagies today: increasing lean protein intake, increase H20 decreasing simple carbohydrates , increasing vegetables, decreasing sodium intake, decrease eating out and work on meal planning and easy cooking plans  Nanako has agreed to follow up with our clinic in 2 weeks. She was informed of the importance of frequent follow up visits to maximize her success with intensive lifestyle modifications for her multiple health conditions.   OBESITY BEHAVIORAL INTERVENTION VISIT  Today's visit was # 2   Starting weight: 231 lbs Starting date: 12/08/2017   Today's weight : Weight: 229 lb (103.9 kg)  Today's date: 12/20/2017 Total lbs lost to date: 2 lbs   ASK: We discussed the diagnosis of obesity with Isaiah Serge today and Halima agreed to give [name] permission to discuss obesity behavioral modification therapy today.  ASSESS: Sarie has the diagnosis of obesity and her BMI today is 41.87 Korah is in the action stage of change   ADVISE: Zoella was educated on the multiple health risks of obesity as well as the benefit of weight loss to improve her health. She was advised of the need for long term treatment and the importance of lifestyle modifications to improve her current health and to decrease her risk of future health problems.  AGREE: Multiple dietary modification options and treatment options were discussed and  Halah  agreed to follow the recommendations documented in the above note.  I have reviewed the above documentation for accuracy and completeness, and I agree with the above. -Jearld Lesch, DO   ARRANGE: Nyjae was educated on the importance of frequent visits to treat obesity as outlined per CMS and USPSTF guidelines and agreed to schedule her next follow up appointment today.  I, Remi Deter, CMA, am acting as transcriptionist for Jearld Lesch, MD

## 2017-12-26 MED FILL — VALSARTAN 320 MG TAB: 320 | 30 days supply | Qty: 30 | Fill #4

## 2017-12-26 MED FILL — DEXILANT DR 60 MG CAPSULE: 60 | 30 days supply | Qty: 30 | Fill #4

## 2018-01-04 ENCOUNTER — Ambulatory Visit (INDEPENDENT_AMBULATORY_CARE_PROVIDER_SITE_OTHER): Payer: 59 | Admitting: Bariatrics

## 2018-01-04 VITALS — BP 121/70 | HR 77 | Temp 97.6°F | Ht 62.0 in | Wt 225.0 lb

## 2018-01-04 DIAGNOSIS — Z9189 Other specified personal risk factors, not elsewhere classified: Secondary | ICD-10-CM

## 2018-01-04 DIAGNOSIS — E559 Vitamin D deficiency, unspecified: Secondary | ICD-10-CM

## 2018-01-04 DIAGNOSIS — Z6841 Body Mass Index (BMI) 40.0 and over, adult: Secondary | ICD-10-CM | POA: Diagnosis not present

## 2018-01-04 DIAGNOSIS — E119 Type 2 diabetes mellitus without complications: Secondary | ICD-10-CM

## 2018-01-04 MED ORDER — VITAMIN D (ERGOCALCIFEROL) 1.25 MG (50000 UNIT) PO CAPS: 50000.0000 [IU] | ORAL_CAPSULE | ORAL | 0 refills | Status: DC

## 2018-01-09 NOTE — Progress Notes (Signed)
Office: [phone]  /  Fax: [phone]   HPI:   Chief Complaint: OBESITY Rhonda Aguilar is here to discuss her progress with her obesity treatment plan. She is on the Category 3 plan + 300 calories and is following her eating plan approximately 98 % of the time. She states she is exercising 0 minutes 0 times per week. Rhonda Aguilar has been following the plan and has been more active. She is focusing on more protein and vegetables.  Her weight is 225 lb (102.1 kg) today and has had a weight loss of 4 pounds over a period of 2 weeks since her last visit. She has lost 6 lbs since starting treatment with [name].  Diabetes II Rhonda Aguilar has a diagnosis of diabetes type II. Bryttany states that her fasting BGs are in the 120's and her post prandial sugars are between 110 to 120's. Her last A1c was 7.3 and she is not taking medications at this time. She has been on glipizide in the past. She has been working on intensive lifestyle modifications including diet, exercise, and weight loss to help control her blood glucose levels.  Vitamin D deficiency Rhonda Aguilar has a diagnosis of vitamin D deficiency. She is currently taking high dose vit D and denies any side effects. She denies nausea, vomiting, or muscle weakness.  At risk for osteopenia and osteoporosis Rhonda Aguilar is at higher risk of osteopenia and osteoporosis due to vitamin D deficiency.   ALLERGIES: Allergies  Allergen Reactions  . Codeine Itching and Rash  . Penicillins Rash    MEDICATIONS: Current Outpatient Medications on File Prior to Visit  Medication Sig Dispense Refill  . busPIRone (BUSPAR) 5 MG tablet Take 5 mg by mouth 2 (two) times daily.    . cyclobenzaprine (FLEXERIL) 10 MG tablet Take 10 mg by mouth 3 (three) times daily as needed for muscle spasms.    [name] dexlansoprazole (DEXILANT) 60 MG capsule Take 60 mg by mouth daily.    . hydrochlorothiazide (MICROZIDE) 12.5 MG capsule Take 12.5 mg by mouth daily. Pt takes Mon, [place] and Fri.  1  . ibuprofen  (ADVIL,MOTRIN) 600 MG tablet Take 600 mg by mouth every 6 (six) hours as needed for mild pain.    [name] metoCLOPramide (REGLAN) 10 MG tablet Take 10 mg by mouth as needed for nausea. migraines    . Peppermint Oil (IBGARD PO) Take by mouth.    . Probiotic Product (ALIGN) 4 MG CAPS Take by mouth.    . valsartan (DIOVAN) 320 MG tablet Take 160 mg by mouth daily.     . verapamil (VERELAN PM) 240 MG 24 hr capsule Take 240 mg by mouth at bedtime.     No current facility-administered medications on file prior to visit.     PAST MEDICAL HISTORY: Past Medical History:  Diagnosis Date  . Benign essential HTN 11/22/2014  . Diabetes mellitus, type II ([place])   . Fatty liver   . GERD (gastroesophageal reflux disease)   . Hypertension   . IBS (irritable bowel syndrome)   . Migraine   . Obesity (BMI 30-39.9) 11/22/2014  . OSA (obstructive sleep apnea)     PAST SURGICAL HISTORY: Past Surgical History:  Procedure Laterality Date  . TONSILLECTOMY      SOCIAL HISTORY: Social History   Tobacco Use  . Smoking status: Never Smoker  . Smokeless tobacco: Never Used  Substance Use Topics  . Alcohol use: Yes    Alcohol/week: 0.0 standard drinks  . Drug use: Not on file  FAMILY HISTORY: Family History  Problem Relation Age of Onset  . Diabetes Mother   . Heart disease Mother   . Hypertension Mother   . High Cholesterol Mother   . Kidney disease Mother   . Depression Mother   . Obesity Mother   . Coronary artery disease Father   . High blood pressure Father   . High Cholesterol Father   . Depression Father   . Hypertension Brother     ROS: Review of Systems  Constitutional: Positive for weight loss.  Gastrointestinal: Negative for nausea and vomiting.  Musculoskeletal:       Negative for muscle weakness.    PHYSICAL EXAM: Blood pressure 121/70, pulse 77, temperature 97.6 F (36.4 C), temperature source Oral, height 5\' 2"  (1.575 m), weight 225 lb (102.1 kg), SpO2 94 %. Body mass  index is 41.15 kg/m. Physical Exam  Constitutional: She is oriented to person, place, and time. She appears well-developed and well-nourished.  Cardiovascular: Normal rate.  Pulmonary/Chest: Effort normal.  Musculoskeletal: Normal range of motion.  Neurological: She is oriented to person, place, and time.  Skin: Skin is warm and dry.  Psychiatric: She has a normal mood and affect. Her behavior is normal.  Vitals reviewed.   RECENT LABS AND TESTS: BMET    Component Value Date/Time   NA 138 12/08/2017 1306   K 4.1 12/08/2017 1306   CL 101 12/08/2017 1306   CO2 18 (L) 12/08/2017 1306   GLUCOSE 122 (H) 12/08/2017 1306   BUN 17 12/08/2017 1306   CREATININE 0.73 12/08/2017 1306   CALCIUM 9.2 12/08/2017 1306   GFRNONAA 87 12/08/2017 1306   GFRAA 101 12/08/2017 1306   No results found for: HGBA1C Lab Results  Component Value Date   INSULIN 38.2 (H) 12/08/2017   CBC    Component Value Date/Time   WBC 8.5 12/08/2017 1306   WBC 16.3 (H) 12/19/2014 1120   RBC 4.64 12/08/2017 1306   RBC 4.92 12/19/2014 1120   HGB 12.9 12/08/2017 1306   HCT 38.1 12/08/2017 1306   PLT 263 12/19/2014 1120   MCV 82 12/08/2017 1306   MCH 27.8 12/08/2017 1306   MCH 27.8 12/19/2014 1120   MCHC 33.9 12/08/2017 1306   MCHC 33.5 12/19/2014 1120   RDW 14.0 12/08/2017 1306   LYMPHSABS 2.5 12/08/2017 1306   MONOABS 1.5 (H) 12/19/2014 1120   EOSABS 0.2 12/08/2017 1306   BASOSABS 0.0 12/08/2017 1306   Iron/TIBC/Ferritin/ %Sat No results found for: IRON, TIBC, FERRITIN, IRONPCTSAT Lipid Panel     Component Value Date/Time   CHOL 197 12/08/2017 1306   TRIG 148 12/08/2017 1306   HDL 42 12/08/2017 1306   LDLCALC 125 (H) 12/08/2017 1306   Hepatic Function Panel     Component Value Date/Time   PROT 6.7 12/08/2017 1306   ALBUMIN 4.4 12/08/2017 1306   AST 90 (H) 12/08/2017 1306   ALT 118 (H) 12/08/2017 1306   ALKPHOS 86 12/08/2017 1306   BILITOT 0.5 12/08/2017 1306      Component Value  Date/Time   TSH 2.550 12/08/2017 1306   Results for ZALI, KAMAKA (MRN [id number]) as of 01/09/2018 15:47  Ref. Range 12/08/2017 13:06  Vitamin D, 25-Hydroxy Latest Ref Range: 30.0 - 100.0 ng/mL 14.4 (L)   ASSESSMENT AND PLAN: Type 2 diabetes mellitus without complication, without long-term current use of insulin ([name])  Vitamin D deficiency - Plan: Vitamin D, Ergocalciferol, (DRISDOL) 1.25 MG (50000 UT) CAPS capsule  At risk for osteoporosis  Class 3 severe obesity with serious comorbidity and body mass index (BMI) of 40.0 to 44.9 in adult, unspecified obesity type ([place])  PLAN:  Diabetes II Rhonda Aguilar has been given extensive diabetes education by myself today including ideal fasting and post-prandial blood glucose readings, individual ideal Hgb A1c goals, and hypoglycemia prevention. We discussed the importance of good blood sugar control to decrease the likelihood of diabetic complications such as nephropathy, neuropathy, limb loss, blindness, coronary artery disease, and death. We discussed the importance of intensive lifestyle modification including diet, exercise and weight loss as the first line treatment for diabetes. Rhonda Aguilar agrees to continue to monitor her fasting blood sugars and her 1 to 2 hour post prandial blood sugars. She agrees that she will follow up at the agreed upon time in 2 weeks.  Vitamin D Deficiency Rhonda Aguilar was informed that low vitamin D levels contributes to fatigue and are associated with obesity, breast, and colon cancer. She agrees to continue to take prescription Vit D @50 ,000 IU weekly #4 with no refills and will follow up for routine testing of vitamin D, at least 2-3 times per year. She was informed of the risk of over-replacement of vitamin D and agrees to not increase her dose unless she discusses this with [name] first. Rhonda Aguilar agrees to follow up in 2 weeks.  At risk for osteopenia and osteoporosis Rhonda Aguilar was given extended (15 minutes) osteoporosis prevention  counseling today. Rhonda Aguilar is at risk for osteopenia and osteoporosis due to her vitamin D deficiency. She was encouraged to take her vitamin D and follow her higher calcium diet and increase strengthening exercise to help strengthen her bones and decrease her risk of osteopenia and osteoporosis.  Obesity Rhonda Aguilar is currently in the action stage of change. As such, her goal is to continue with weight loss efforts. She has agreed to follow the Category 3 plan + 300 calories. Rhonda Aguilar has been instructed to work up to a goal of 150 minutes of combined cardio and strengthening exercise per week for weight loss and overall health benefits. We discussed the following Behavioral Modification Strategies today: increasing lean protein intake, decreasing simple carbohydrates, increasing vegetables, increase H2O intake, decrease eating out, no skipping meals, and work on meal planning and easy cooking plans.  Early has agreed to follow up with our clinic in 2 weeks. She was informed of the importance of frequent follow up visits to maximize her success with intensive lifestyle modifications for her multiple health conditions.   OBESITY BEHAVIORAL INTERVENTION VISIT  Today's visit was # 3   Starting weight: 231 lbs Starting date: 12/08/17 Today's weight : Weight: 225 lb (102.1 kg)  Today's date: 01/04/2018 Total lbs lost to date: 6  ASK: We discussed the diagnosis of obesity with Rhonda Aguilar today and Rhonda Aguilar agreed to give [name] permission to discuss obesity behavioral modification therapy today.  ASSESS: Rhonda Aguilar has the diagnosis of obesity and her BMI today is 41.14. Rhonda Aguilar is in the action stage of change.   ADVISE: Rhonda Aguilar was educated on the multiple health risks of obesity as well as the benefit of weight loss to improve her health. She was advised of the need for long term treatment and the importance of lifestyle modifications to improve her current health and to decrease her risk of future health  problems.  AGREE: Multiple dietary modification options and treatment options were discussed and Rhonda Aguilar agreed to follow the recommendations documented in the above note.  ARRANGE: Rhonda Aguilar was educated on the importance of frequent  visits to treat obesity as outlined per CMS and USPSTF guidelines and agreed to schedule her next follow up appointment today.  I, Marcille Blanco, am acting as Location manager for [employer]. Owens Shark, DO  I have reviewed the above documentation for accuracy and completeness, and I agree with the above. -Jearld Lesch, DO

## 2018-01-12 ENCOUNTER — Encounter: Payer: Self-pay | Admitting: Neurology

## 2018-01-12 ENCOUNTER — Other Ambulatory Visit: Payer: Self-pay | Admitting: Neurology

## 2018-01-17 ENCOUNTER — Encounter: Payer: Self-pay | Admitting: Neurology

## 2018-01-17 ENCOUNTER — Ambulatory Visit (INDEPENDENT_AMBULATORY_CARE_PROVIDER_SITE_OTHER): Payer: 59 | Admitting: Neurology

## 2018-01-17 VITALS — BP 102/84 | HR 68 | Ht 62.5 in | Wt 227.0 lb

## 2018-01-17 DIAGNOSIS — E119 Type 2 diabetes mellitus without complications: Secondary | ICD-10-CM

## 2018-01-17 DIAGNOSIS — R0609 Other forms of dyspnea: Secondary | ICD-10-CM

## 2018-01-17 DIAGNOSIS — Z6841 Body Mass Index (BMI) 40.0 and over, adult: Secondary | ICD-10-CM

## 2018-01-17 DIAGNOSIS — G43111 Migraine with aura, intractable, with status migrainosus: Secondary | ICD-10-CM | POA: Diagnosis not present

## 2018-01-17 DIAGNOSIS — G43411 Hemiplegic migraine, intractable, with status migrainosus: Secondary | ICD-10-CM | POA: Insufficient documentation

## 2018-01-17 DIAGNOSIS — G4733 Obstructive sleep apnea (adult) (pediatric): Secondary | ICD-10-CM

## 2018-01-17 DIAGNOSIS — Z9989 Dependence on other enabling machines and devices: Secondary | ICD-10-CM

## 2018-01-17 DIAGNOSIS — R0600 Dyspnea, unspecified: Secondary | ICD-10-CM

## 2018-01-17 MED ORDER — PROMETHAZINE HCL 12.5 MG PO TABS: 12.5000 mg | ORAL_TABLET | Freq: Three times a day (TID) | ORAL | 3 refills | Status: DC | PRN

## 2018-01-17 MED FILL — PROMETHAZINE 12.5 MG TABLET: 12.5 | 20 days supply | Qty: 60 | Fill #0

## 2018-01-17 MED FILL — VIT D2 1.25 MG (50,000 UNIT: 1.25 MG | 28 days supply | Qty: 4 | Fill #0

## 2018-01-17 NOTE — Patient Instructions (Addendum)
I have ordered a home sleep test.   You will likely qualify for a new machine and this one will be an auto-titration capable device.   We will order a nasal bella swift with ear loops for you.    Nausea with migraines should be treated with Phenergan.

## 2018-01-17 NOTE — Progress Notes (Signed)
SLEEP MEDICINE CLINIC   Provider:  Larey Seat, MD   Primary Care Physician:  Darcus Austin, MD   Referring Provider: Darcus Austin, MD    Chief Complaint  Patient presents with  . New Patient (Initial Visit)    pt alone, rm 11. pt states that she started CPAP around 2012 following her last sleep study . DME [facility]. Still currently using her 64 year old machine and [facility] states still working properly.  Was just diagnosed diabetic started metformin, and medical weight management -  and developed ocular migraines on metformin, has now also a new olfactory aura.  She started glipizide and developed numbness in hand.  she came to make sure CPAP was working well. she was told she was snoring when she was on a trip.     HPI:  Rhonda Aguilar is a 62 y.o. female patient, seen here on 01-17-2018 in a referral from Dr. Inda Merlin.  I have the pleasure of meeting today with Rhonda Aguilar. 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  In 2012 she was diagnosed with obstructive sleep apnea and received her first CPAP machine which is still her current device.  She wants to make sure that the machine is still working and that the settings are appropriate for her, as she is due for a new one.  She was diagnosed with diabetes this summer for started on oral metformin after which she developed a ocular migraine that was new to her.  Migraines have been present in the past they were well controlled for a long time, but now she has a new olfactory aura she did not experience before she has developed numbness in one hand and the left arm after taking glipizide and she also wonders if the migraine development is due to the diabetic treatment or an entirely new phenomenon.  She has been referred to medical weight management with Dr. Leafy Ro and is doing well.  As to her social history: Rhonda Aguilar left for many years with her ailing older mother, her mother  passed about 5 years ago.  I have not seen her in this 5-year interval.  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  On CPAP she has no trouble to initiate sleep, feels ready when she puts it on. She wakes at 4 AM for one bathroom break.  Rises at 6 AM and averages 7 hours of sleep. She snores if she wouldn't use CPAP. Snores sometimes through the CPAP.  Dreams often, but not nightmarish. Feels stiff at night, discomfort due to stiffness.   Medical history is positive for a mother recently discovered diabetes mellitus, her last HbA1c on 01 November 2017 was 7.3, she has some joint pains a sed rate was negative, BMI is between 40 and 45 and has been.  She has a history of essential primary hypertension and obstructive sleep apnea on CPAP she has elevated AST's and ALTs.  Unclear if this is not followed by Dr. Collene Mares in GI.  Rhonda Aguilar was evaluated and a sleep study on 08 November 2010 at the time referred by her headache specialist Dr. Orie Rout, at the time her BMI was 36,  sleep efficiency was 85.8%, the AHI was 2.3 REM AHI was 14.1, supine AHI was 5.7 after she reached a CPAP of 8 cm water pressure.  She was prescribed a nasal pillow mask at the time and an 8 cm pressure to which her machine is still set.  [facility] shows that the patient uses the machine 100% of the time was 6 hours 41 minutes on average, set pressure 8 cmH2O with 3 cm EPR residual AHI is 0.9 which is excellent, there is no major air leak and no central apneas are emerging.     Review of Systems: Out of a complete 14 system review, the patient complains of only the following symptoms, and all other reviewed systems are negative.  Epworth sleepiness score was  endorsed at only 6 out of 24 possible points, fatigue severity score at 30 out of 63 points, Geriatric depression score at 3 out of 15 points. snoring through CPAP.     Social History   Socioeconomic History  . Marital status: Divorced    Spouse name: Not on file  . Number of children: 0  . Years of education: college  . Highest education level: Not on file  Occupational History  . Occupation: Engineer, mining cardiology  Social Needs  . Financial resource strain:   . Food insecurity:    Worry:     Inability:   . Transportation needs:    Medical:     Non-medical:   Tobacco Use  . Smoking status: Never Smoker  . Smokeless tobacco: Never Used  Substance and Sexual Activity  . Alcohol use: Yes    Alcohol/week: 0.0 standard drinks  . Drug use: Not on file  . Sexual activity: Not on file  Lifestyle  . Physical activity:  5 k steps per day at hospital during work.     Days per week: Not on file    Minutes per session: Not on file  . Stress: Not on file  Relationships  . Social connections:    Talks on phone:     Gets together:     Attends religious service:     Active member of club or organization:     Attends meetings of clubs or organizations:     Relationship status:   . Intimate partner violence:    Fear of current or ex partner:     Emotionally abused:     Physically abused:     Forced sexual activity:   Other Topics Concern  . Not on file  Social History Narrative  . Not on file    Family History  Problem Relation Age of Onset  . Diabetes Mother   . Heart disease Mother   . Hypertension Mother   . High Cholesterol Mother   . Kidney disease Mother   . Depression Mother   . Obesity Mother   . Coronary artery disease Father   . High blood pressure Father   . High Cholesterol Father   . Depression Father   . Hypertension Brother     Past Medical History:  Diagnosis Date  . Benign essential HTN 11/22/2014  . Diabetes mellitus, type II ([place])   .  Fatty liver   . GERD (gastroesophageal reflux disease)   . Hypertension   . IBS (irritable bowel syndrome)   . Migraine   . Obesity (BMI 30-39.9) 11/22/2014  . OSA (obstructive sleep apnea)     Past Surgical History:  Procedure Laterality Date  .  COLONOSCOPY    . TONSILLECTOMY      Current Outpatient Medications  Medication Sig Dispense Refill  . ALPRAZolam (XANAX) 0.5 MG tablet Take 0.25-1 mg by mouth every 8 (eight) hours as needed for anxiety.    . busPIRone (BUSPAR) 5 MG tablet Take 5 mg by mouth 2 (two) times daily.    . cetirizine (ZYRTEC) 10 MG tablet Take 10 mg by mouth daily.    . cyclobenzaprine (FLEXERIL) 10 MG tablet Take 10 mg by mouth 3 (three) times daily as needed for muscle spasms.    [name] dexlansoprazole (DEXILANT) 60 MG capsule Take 60 mg by mouth daily.    [name] ibuprofen (ADVIL,MOTRIN) 600 MG tablet Take 600 mg by mouth every 6 (six) hours as needed for mild pain.    [name] Peppermint Oil (IBGARD PO) Take by mouth.    . Probiotic Product (ALIGN) 4 MG CAPS Take by mouth.    . valsartan (DIOVAN) 320 MG tablet Take 320 mg by mouth daily.     . Verapamil HCl CR 300 MG CP24 Take 300 mg by mouth at bedtime.     . Vitamin D, Ergocalciferol, (DRISDOL) 1.25 MG (50000 UT) CAPS capsule Take 1 capsule (50,000 Units total) by mouth every 7 (seven) days. 4 capsule 0   No current facility-administered medications for this visit.     Allergies as of 01/17/2018 - Review Complete 01/17/2018  Allergen Reaction Noted  . Codeine Itching and Rash 11/22/2014  . Penicillins Rash 11/22/2014    Vitals: BP 102/84   Pulse 68   Ht 5' 2.5" (1.588 m)   Wt 227 lb (103 kg)   BMI 40.86 kg/m  Last Weight:  Wt Readings from Last 1 Encounters:  01/17/18 227 lb (103 kg)   BMW:UXLK mass index is 40.86 kg/m.     Last Height:   Ht Readings from Last 1 Encounters:  01/17/18 5' 2.5" (1.588 m)    Physical exam:  General: The patient is awake, alert and appears not in acute distress. The patient  is well groomed. Head: Normocephalic, atraumatic. Neck is supple. Mallampati 2-,  neck circumference:16.5 ' . Nasal airflow congested ,  Retrognathia is seen.  Cardiovascular:  Regular rate and rhythm , without  murmurs or carotid bruit, and without distended neck veins. Respiratory: Lungs are clear to auscultation. Skin:  Without evidence of edema, or rash Trunk: BMI is 44 . The patient's posture is erect.   Neurologic exam : The patient is awake and alert, oriented to place and time.   Attention span & concentration ability appears normal.  Speech is fluent,  without dysarthria, dysphonia or aphasia.  Mood and affect are appropriate.  Cranial nerves: Pupils are equal and briskly reactive to light. Funduscopic exam without evidence of pallor or edema.  Extraocular movements  in vertical and horizontal planes intact and without nystagmus. Visual fields by finger perimetry are intact. Hearing to finger rub intact.  Facial sensation intact to fine touch. Facial motor strength is symmetric and tongue and uvula move midline. Shoulder shrug was symmetrical.   Motor exam:   Normal tone, muscle bulk and symmetric strength in all extremities. Sensory:  Fine touch, pinprick and vibration were intact in all acral extremities.  Proprioception tested in the upper extremities was normal. Coordination: Rapid alternating movements in the fingers/hands were  normal.  No changes in penmanship.  Finger-to-nose maneuver normal without evidence of ataxia, dysmetria or tremor. Gait and station: Patient walks without assistive device . Stance is  stable and normal.   Deep tendon reflexes: in the  upper and lower extremities are symmetric and intact.   Assessment:  After physical and neurologic examination, review of laboratory studies,  Personal review of imaging studies, reports of other /same  Imaging studies, results of polysomnography and / or neurophysiology testing and pre-existing records as far as  provided in visit., my assessment is :    1) I have no doubt that the patient still has obstructive sleep apnea, and her report that she sometimes makes snores through the nasal pillow interface is not surprising however she has very few leaks according to her download, she is highly compliant and her residual AHI is 0.9 at the current 8 cmH2O pressure setting.  She is neither excessively daytime sleepy nor excessively fatigued at this time.  I think would be happy to order a home sleep test for her for 1 night without CPAP and then later her new machine based on the results.   This will avoid for her having to come to the sleep lab.   2) In addition I think that she has a good chance of reducing her body mass index again under 35.  This will treat equally hypertension, diabetes and probably help her migraines.  She would like to return to metformin.   3) As to her migraines there are new qualities such as an aura with olfactory component and a possible hemiparetic component.  I do not think that untreated or poorly treated apnea has played a role, her numbers look too good for this mother simple explanation.  I do think however that hypertension diabetes will be get migraine headaches in a person that have been a migraineur.   The patient was advised of the nature of the diagnosed disorder , the treatment options and the risks for general health and wellness arising from not treating the condition.   I spent more than 45  minutes of face to face time with the patient. Greater than 50% of time was spent in counseling and coordination of care. We have discussed the diagnosis and differential and I answered the patient's questions.    Plan:  Treatment plan and additional workup :  HST to screen for current OSA level. Next CPAP should  be an auto-titrator capable and set to 6-12 cm water , 3 cm EPR . We will order a nasal bella swift with ear loops for you.   Continue weight loss.  Migraine -   Consider Topiramate for appetite and headache prevention.   Golden Hurter, MD - would she qualify for calcium score CT - cardiac CTA ?  Her migraine let to left arm and hand numbness, may be cardiac?   Larey Seat, MD 82/99/3716, 96:78 AM  Certified in Neurology by ABPN Certified in [place] by [facility] [address] [place], [place] [address]

## 2018-01-18 ENCOUNTER — Ambulatory Visit (INDEPENDENT_AMBULATORY_CARE_PROVIDER_SITE_OTHER): Payer: 59 | Admitting: Bariatrics

## 2018-01-18 ENCOUNTER — Encounter (INDEPENDENT_AMBULATORY_CARE_PROVIDER_SITE_OTHER): Payer: Self-pay | Admitting: Bariatrics

## 2018-01-18 VITALS — BP 114/71 | HR 56 | Temp 97.7°F | Ht 62.0 in | Wt 223.0 lb

## 2018-01-18 DIAGNOSIS — Z6841 Body Mass Index (BMI) 40.0 and over, adult: Secondary | ICD-10-CM | POA: Diagnosis not present

## 2018-01-18 DIAGNOSIS — E119 Type 2 diabetes mellitus without complications: Secondary | ICD-10-CM | POA: Diagnosis not present

## 2018-01-18 DIAGNOSIS — E559 Vitamin D deficiency, unspecified: Secondary | ICD-10-CM | POA: Diagnosis not present

## 2018-01-24 NOTE — Progress Notes (Signed)
Office: [phone]  /  Fax: [phone]   HPI:   Chief Complaint: OBESITY Rhonda Aguilar is here to discuss her progress with her obesity treatment plan. She is on the  follow the Category 3 plan +300 calories and is following her eating plan approximately 95 % of the time. She states she is exercising by walking 5,000 steps 4 times a week.  Rhonda Aguilar is not struggling at this time.  Her weight is 223 lb (101.2 kg) today and has had a weight loss of 2 pounds over a period of 2 weeks since her last visit. She has lost 8 lbs since starting treatment with [name].  Diabetes II Rhonda Aguilar has a diagnosis of diabetes type II. Meha states fasting BGs range between 105 and 126 and 2 hour postprandial BGs range between 90 and 115. She  denies any hypoglycemic episodes. She has been working on intensive lifestyle modifications including diet, exercise, and weight loss to help control her blood glucose levels. She is not currently on any medications. She was on glipizide in the past.   Vitamin D deficiency Rhonda Aguilar has a diagnosis of vitamin D deficiency. She is currently taking vit D and denies nausea, vomiting or muscle weakness.  Ref. Range 12/08/2017 13:06  Vitamin D, 25-Hydroxy Latest Ref Range: 30.0 - 100.0 ng/mL 14.4 (L)    ALLERGIES: Allergies  Allergen Reactions  . Codeine Itching and Rash  . Penicillins Rash    MEDICATIONS: Current Outpatient Medications on File Prior to Visit  Medication Sig Dispense Refill  . ALPRAZolam (XANAX) 0.5 MG tablet Take 0.25-1 mg by mouth every 8 (eight) hours as needed for anxiety.    . busPIRone (BUSPAR) 5 MG tablet Take 5 mg by mouth 2 (two) times daily.    . cetirizine (ZYRTEC) 10 MG tablet Take 10 mg by mouth daily.    . cyclobenzaprine (FLEXERIL) 10 MG tablet Take 10 mg by mouth 3 (three) times daily as needed for muscle spasms.    [name] dexlansoprazole (DEXILANT) 60 MG capsule Take 60 mg by mouth daily.    [name] ibuprofen (ADVIL,MOTRIN) 600 MG tablet Take 600 mg by mouth  every 6 (six) hours as needed for mild pain.    [name] Peppermint Oil (IBGARD PO) Take by mouth.    . Probiotic Product (ALIGN) 4 MG CAPS Take by mouth.    . promethazine (PHENERGAN) 12.5 MG tablet Take 1 tablet (12.5 mg total) by mouth every 8 (eight) hours as needed for nausea, vomiting or refractory nausea / vomiting. 60 tablet 3  . valsartan (DIOVAN) 320 MG tablet Take 320 mg by mouth daily.     . Verapamil HCl CR 300 MG CP24 Take 300 mg by mouth at bedtime.     . Vitamin D, Ergocalciferol, (DRISDOL) 1.25 MG (50000 UT) CAPS capsule Take 1 capsule (50,000 Units total) by mouth every 7 (seven) days. 4 capsule 0   No current facility-administered medications on file prior to visit.     PAST MEDICAL HISTORY: Past Medical History:  Diagnosis Date  . Benign essential HTN 11/22/2014  . Diabetes mellitus, type II ([place])   . Fatty liver   . GERD (gastroesophageal reflux disease)   . Hypertension   . IBS (irritable bowel syndrome)   . Migraine   . Obesity (BMI 30-39.9) 11/22/2014  . OSA (obstructive sleep apnea)     PAST SURGICAL HISTORY: Past Surgical History:  Procedure Laterality Date  . COLONOSCOPY    . TONSILLECTOMY      SOCIAL  HISTORY: Social History   Tobacco Use  . Smoking status: Never Smoker  . Smokeless tobacco: Never Used  Substance Use Topics  . Alcohol use: Yes    Alcohol/week: 0.0 standard drinks  . Drug use: Not on file    FAMILY HISTORY: Family History  Problem Relation Age of Onset  . Diabetes Mother   . Heart disease Mother   . Hypertension Mother   . High Cholesterol Mother   . Kidney disease Mother   . Depression Mother   . Obesity Mother   . Coronary artery disease Father   . High blood pressure Father   . High Cholesterol Father   . Depression Father   . Hypertension Brother     ROS: Review of Systems  Constitutional: Positive for weight loss.  Gastrointestinal: Negative for nausea and vomiting.  Musculoskeletal:       Negative for muscle  weakness  Endo/Heme/Allergies:       Negative for hypoglycemia    PHYSICAL EXAM: Blood pressure 114/71, pulse (!) 56, temperature 97.7 F (36.5 C), temperature source Oral, height 5\' 2"  (1.575 m), weight 223 lb (101.2 kg), SpO2 95 %. Body mass index is 40.79 kg/m. Physical Exam  Constitutional: She is oriented to person, place, and time. She appears well-developed and well-nourished.  HENT:  Head: Normocephalic.  Eyes: Pupils are equal, round, and reactive to light.  Neck: Normal range of motion.  Cardiovascular: Normal rate.  Pulmonary/Chest: Effort normal.  Musculoskeletal: Normal range of motion.  Neurological: She is alert and oriented to person, place, and time.  Skin: Skin is warm and dry.  Psychiatric: She has a normal mood and affect. Her behavior is normal.  Vitals reviewed.   RECENT LABS AND TESTS: BMET    Component Value Date/Time   NA 138 12/08/2017 1306   K 4.1 12/08/2017 1306   CL 101 12/08/2017 1306   CO2 18 (L) 12/08/2017 1306   GLUCOSE 122 (H) 12/08/2017 1306   BUN 17 12/08/2017 1306   CREATININE 0.73 12/08/2017 1306   CALCIUM 9.2 12/08/2017 1306   GFRNONAA 87 12/08/2017 1306   GFRAA 101 12/08/2017 1306   No results found for: HGBA1C Lab Results  Component Value Date   INSULIN 38.2 (H) 12/08/2017   CBC    Component Value Date/Time   WBC 8.5 12/08/2017 1306   WBC 16.3 (H) 12/19/2014 1120   RBC 4.64 12/08/2017 1306   RBC 4.92 12/19/2014 1120   HGB 12.9 12/08/2017 1306   HCT 38.1 12/08/2017 1306   PLT 263 12/19/2014 1120   MCV 82 12/08/2017 1306   MCH 27.8 12/08/2017 1306   MCH 27.8 12/19/2014 1120   MCHC 33.9 12/08/2017 1306   MCHC 33.5 12/19/2014 1120   RDW 14.0 12/08/2017 1306   LYMPHSABS 2.5 12/08/2017 1306   MONOABS 1.5 (H) 12/19/2014 1120   EOSABS 0.2 12/08/2017 1306   BASOSABS 0.0 12/08/2017 1306   Iron/TIBC/Ferritin/ %Sat No results found for: IRON, TIBC, FERRITIN, IRONPCTSAT Lipid Panel     Component Value Date/Time   CHOL  197 12/08/2017 1306   TRIG 148 12/08/2017 1306   HDL 42 12/08/2017 1306   LDLCALC 125 (H) 12/08/2017 1306   Hepatic Function Panel     Component Value Date/Time   PROT 6.7 12/08/2017 1306   ALBUMIN 4.4 12/08/2017 1306   AST 90 (H) 12/08/2017 1306   ALT 118 (H) 12/08/2017 1306   ALKPHOS 86 12/08/2017 1306   BILITOT 0.5 12/08/2017 1306  Component Value Date/Time   TSH 2.550 12/08/2017 1306    Ref. Range 12/08/2017 13:06  Vitamin D, 25-Hydroxy Latest Ref Range: 30.0 - 100.0 ng/mL 14.4 (L)    ASSESSMENT AND PLAN: Type 2 diabetes mellitus without complication, without long-term current use of insulin ([name])  Vitamin D deficiency  Class 3 severe obesity with serious comorbidity and body mass index (BMI) of 40.0 to 44.9 in adult, unspecified obesity type ([place])  PLAN: Diabetes II Rhonda Aguilar has been given extensive diabetes education by myself today including ideal fasting and post-prandial blood glucose readings, individual ideal HgA1c goals  and hypoglycemia prevention. We discussed the importance of good blood sugar control to decrease the likelihood of diabetic complications such as nephropathy, neuropathy, limb loss, blindness, coronary artery disease, and death. We discussed the importance of intensive lifestyle modification including diet, exercise and weight loss as the first line treatment for diabetes. Rhonda Aguilar agrees to continue decreasing carbohydrates and increasing protein and will follow up at the agreed upon time.  Vitamin D Deficiency Rhonda Aguilar was informed that low vitamin D levels contributes to fatigue and are associated with obesity, breast, and colon cancer. She agrees to continue to take prescription Vit D @50 ,000 IU every week, no refill needed and will follow up for routine testing of vitamin D, at least 2-3 times per year. She was informed of the risk of over-replacement of vitamin D and agrees to not increase her dose unless she discusses this with [name] first. Agrees to  follow up with our clinic as directed.   I spent > than 50% of the 15 minute visit on counseling as documented in the note.  Obesity Rhonda Aguilar is currently in the action stage of change. As such, her goal is to continue with weight loss efforts She has agreed to follow the Category 3 plan +300 calories.  Rhonda Aguilar has been instructed to work up to a goal of 150 minutes of combined cardio and strengthening exercise per week for weight loss and overall health benefits. We discussed the following Behavioral Modification Strategies today: increasing lean protein intake, decreasing simple carbohydrates, increasing water intake, celebration eating strategies, increasing vegetables, and holiday eating strategies.  She was also given a handout on strategies for Thanksgiving.   Iver has agreed to follow up with our clinic in 2 weeks. She was informed of the importance of frequent follow up visits to maximize her success with intensive lifestyle modifications for her multiple health conditions.   OBESITY BEHAVIORAL INTERVENTION VISIT  Today's visit was # 4   Starting weight: 231 lb Starting date: 12/08/17 Today's weight : Weight: 223 lb (101.2 kg)  Today's date: 01/18/18 Total lbs lost to date: 8 lb    ASK: We discussed the diagnosis of obesity with Rhonda Aguilar today and Rhonda Aguilar agreed to give [name] permission to discuss obesity behavioral modification therapy today.  ASSESS: Rhonda Aguilar has the diagnosis of obesity and her BMI today is 40.78 Rhonda Aguilar is in the action stage of change   ADVISE: Rhonda Aguilar was educated on the multiple health risks of obesity as well as the benefit of weight loss to improve her health. She was advised of the need for long term treatment and the importance of lifestyle modifications to improve her current health and to decrease her risk of future health problems.  AGREE: Multiple dietary modification options and treatment options were discussed and  Rhonda Aguilar agreed to follow the  recommendations documented in the above note.  ARRANGE: Rhonda Aguilar was educated on the importance of  frequent visits to treat obesity as outlined per CMS and USPSTF guidelines and agreed to schedule her next follow up appointment today.  Leary Roca, am acting as transcriptionist for [employer], DO   I have reviewed the above documentation for accuracy and completeness, and I agree with the above. -Jearld Lesch, DO

## 2018-01-25 DIAGNOSIS — E1165 Type 2 diabetes mellitus with hyperglycemia: Secondary | ICD-10-CM | POA: Diagnosis not present

## 2018-01-25 DIAGNOSIS — Z6841 Body Mass Index (BMI) 40.0 and over, adult: Secondary | ICD-10-CM | POA: Diagnosis not present

## 2018-01-25 DIAGNOSIS — I10 Essential (primary) hypertension: Secondary | ICD-10-CM | POA: Diagnosis not present

## 2018-01-25 DIAGNOSIS — F341 Dysthymic disorder: Secondary | ICD-10-CM | POA: Diagnosis not present

## 2018-01-25 MED FILL — busPIRone HCL 5 MG TABS: 5 | 90 days supply | Qty: 90 | Fill #1

## 2018-01-25 MED FILL — DEXILANT DR 60 MG CAPSULE: 60 | 30 days supply | Qty: 30 | Fill #5

## 2018-02-02 ENCOUNTER — Ambulatory Visit (INDEPENDENT_AMBULATORY_CARE_PROVIDER_SITE_OTHER): Payer: 59 | Admitting: Bariatrics

## 2018-02-02 ENCOUNTER — Encounter (INDEPENDENT_AMBULATORY_CARE_PROVIDER_SITE_OTHER): Payer: Self-pay | Admitting: Bariatrics

## 2018-02-02 VITALS — BP 105/62 | HR 77 | Temp 98.2°F | Ht 62.0 in | Wt 221.0 lb

## 2018-02-02 DIAGNOSIS — E119 Type 2 diabetes mellitus without complications: Secondary | ICD-10-CM | POA: Diagnosis not present

## 2018-02-02 DIAGNOSIS — E559 Vitamin D deficiency, unspecified: Secondary | ICD-10-CM | POA: Diagnosis not present

## 2018-02-02 DIAGNOSIS — Z9189 Other specified personal risk factors, not elsewhere classified: Secondary | ICD-10-CM

## 2018-02-02 DIAGNOSIS — Z6841 Body Mass Index (BMI) 40.0 and over, adult: Secondary | ICD-10-CM | POA: Diagnosis not present

## 2018-02-02 DIAGNOSIS — K5909 Other constipation: Secondary | ICD-10-CM

## 2018-02-02 MED ORDER — VITAMIN D (ERGOCALCIFEROL) 1.25 MG (50000 UNIT) PO CAPS: 50000.0000 [IU] | ORAL_CAPSULE | ORAL | 0 refills | Status: DC

## 2018-02-02 MED FILL — VALSARTAN 320 MG TAB: 320 | 30 days supply | Qty: 30 | Fill #5

## 2018-02-02 NOTE — Progress Notes (Signed)
Office: [phone]  /  Fax: [phone]   HPI:   Chief Complaint: OBESITY Rhonda Aguilar is here to discuss her progress with her obesity treatment plan. She is on the Category 3 plan+300 calories and is following her eating plan approximately 85 % of the time. She states she is exercising 0 minutes 0 times per week. Rhonda Aguilar is doing well. She has been more active. Her weight is 221 lb (100.2 kg) today and has had a weight loss of 2 pounds over a period of 2 weeks since her last visit. She has lost 10 lbs since starting treatment with [name].  Vitamin D deficiency Rhonda Aguilar has a diagnosis of vitamin D deficiency. She is currently taking vit D and denies nausea, vomiting or muscle weakness.  At risk for osteopenia and osteoporosis Rhonda Aguilar is at higher risk of osteopenia and osteoporosis due to vitamin D deficiency.   Diabetes II Rhonda Aguilar has a diagnosis of diabetes type II. She is not currently on medications. She was previously on Glipizide. Rhonda Aguilar states fasting BGs range in the 120's and 2 hour post prandial BGs range between 110 and 120 and she denies any hypoglycemic episodes. She has been working on intensive lifestyle modifications including diet, exercise, and weight loss to help control her blood glucose levels.  ALLERGIES: Allergies  Allergen Reactions  . Codeine Itching and Rash  . Penicillins Rash    MEDICATIONS: Current Outpatient Medications on File Prior to Visit  Medication Sig Dispense Refill  . ALPRAZolam (XANAX) 0.5 MG tablet Take 0.25-1 mg by mouth every 8 (eight) hours as needed for anxiety.    . busPIRone (BUSPAR) 5 MG tablet Take 5 mg by mouth 2 (two) times daily.    . cetirizine (ZYRTEC) 10 MG tablet Take 10 mg by mouth daily.    . cyclobenzaprine (FLEXERIL) 10 MG tablet Take 10 mg by mouth 3 (three) times daily as needed for muscle spasms.    Rhonda Aguilar Kitchen dexlansoprazole (DEXILANT) 60 MG capsule Take 60 mg by mouth daily.    Rhonda Aguilar Kitchen ibuprofen (ADVIL,MOTRIN) 600 MG tablet Take 600 mg by mouth  every 6 (six) hours as needed for mild pain.    Rhonda Aguilar Kitchen Peppermint Oil (IBGARD PO) Take by mouth.    . Probiotic Product (ALIGN) 4 MG CAPS Take by mouth.    . promethazine (PHENERGAN) 12.5 MG tablet Take 1 tablet (12.5 mg total) by mouth every 8 (eight) hours as needed for nausea, vomiting or refractory nausea / vomiting. 60 tablet 3  . valsartan (DIOVAN) 320 MG tablet Take 320 mg by mouth daily.     . Verapamil HCl CR 300 MG CP24 Take 300 mg by mouth at bedtime.      No current facility-administered medications on file prior to visit.     PAST MEDICAL HISTORY: Past Medical History:  Diagnosis Date  . Benign essential HTN 11/22/2014  . Diabetes mellitus, type II ([place])   . Fatty liver   . GERD (gastroesophageal reflux disease)   . Hypertension   . IBS (irritable bowel syndrome)   . Migraine   . Obesity (BMI 30-39.9) 11/22/2014  . OSA (obstructive sleep apnea)     PAST SURGICAL HISTORY: Past Surgical History:  Procedure Laterality Date  . COLONOSCOPY    . TONSILLECTOMY      SOCIAL HISTORY: Social History   Tobacco Use  . Smoking status: Never Smoker  . Smokeless tobacco: Never Used  Substance Use Topics  . Alcohol use: Yes    Alcohol/week: 0.0 standard drinks  .  Drug use: Not on file    FAMILY HISTORY: Family History  Problem Relation Age of Onset  . Diabetes Mother   . Heart disease Mother   . Hypertension Mother   . High Cholesterol Mother   . Kidney disease Mother   . Depression Mother   . Obesity Mother   . Coronary artery disease Father   . High blood pressure Father   . High Cholesterol Father   . Depression Father   . Hypertension Brother     ROS: Review of Systems  Constitutional: Positive for weight loss.  Gastrointestinal: Negative for nausea and vomiting.  Musculoskeletal:       Negative for muscle weakness  Endo/Heme/Allergies:       Negative for hypoglycemia    PHYSICAL EXAM: Blood pressure 105/62, pulse 77, temperature 98.2 F (36.8 C),  temperature source Oral, height 5\' 2"  (1.575 m), weight 221 lb (100.2 kg), SpO2 94 %. Body mass index is 40.42 kg/m. Physical Exam Vitals signs reviewed.  Constitutional:      Appearance: Normal appearance. She is well-developed. She is obese.  Cardiovascular:     Rate and Rhythm: Normal rate.  Pulmonary:     Effort: Pulmonary effort is normal.  Musculoskeletal: Normal range of motion.  Skin:    General: Skin is warm and dry.  Neurological:     Mental Status: She is alert and oriented to person, place, and time.  Psychiatric:        Mood and Affect: Mood normal.        Behavior: Behavior normal.     RECENT LABS AND TESTS: BMET    Component Value Date/Time   NA 138 12/08/2017 1306   K 4.1 12/08/2017 1306   CL 101 12/08/2017 1306   CO2 18 (L) 12/08/2017 1306   GLUCOSE 122 (H) 12/08/2017 1306   BUN 17 12/08/2017 1306   CREATININE 0.73 12/08/2017 1306   CALCIUM 9.2 12/08/2017 1306   GFRNONAA 87 12/08/2017 1306   GFRAA 101 12/08/2017 1306   No results found for: HGBA1C Lab Results  Component Value Date   INSULIN 38.2 (H) 12/08/2017   CBC    Component Value Date/Time   WBC 8.5 12/08/2017 1306   WBC 16.3 (H) 12/19/2014 1120   RBC 4.64 12/08/2017 1306   RBC 4.92 12/19/2014 1120   HGB 12.9 12/08/2017 1306   HCT 38.1 12/08/2017 1306   PLT 263 12/19/2014 1120   MCV 82 12/08/2017 1306   MCH 27.8 12/08/2017 1306   MCH 27.8 12/19/2014 1120   MCHC 33.9 12/08/2017 1306   MCHC 33.5 12/19/2014 1120   RDW 14.0 12/08/2017 1306   LYMPHSABS 2.5 12/08/2017 1306   MONOABS 1.5 (H) 12/19/2014 1120   EOSABS 0.2 12/08/2017 1306   BASOSABS 0.0 12/08/2017 1306   Iron/TIBC/Ferritin/ %Sat No results found for: IRON, TIBC, FERRITIN, IRONPCTSAT Lipid Panel     Component Value Date/Time   CHOL 197 12/08/2017 1306   TRIG 148 12/08/2017 1306   HDL 42 12/08/2017 1306   LDLCALC 125 (H) 12/08/2017 1306   Hepatic Function Panel     Component Value Date/Time   PROT 6.7 12/08/2017  1306   ALBUMIN 4.4 12/08/2017 1306   AST 90 (H) 12/08/2017 1306   ALT 118 (H) 12/08/2017 1306   ALKPHOS 86 12/08/2017 1306   BILITOT 0.5 12/08/2017 1306      Component Value Date/Time   TSH 2.550 12/08/2017 1306    Ref. Range 12/08/2017 13:06  Vitamin D, 25-Hydroxy  Latest Ref Range: 30.0 - 100.0 ng/mL 14.4 (L)   ASSESSMENT AND PLAN: Vitamin D deficiency - Plan: Vitamin D, Ergocalciferol, (DRISDOL) 1.25 MG (50000 UT) CAPS capsule, DISCONTINUED: Vitamin D, Ergocalciferol, (DRISDOL) 1.25 MG (50000 UT) CAPS capsule  Type 2 diabetes mellitus without complication, without long-term current use of insulin ([name])  Other constipation  At risk for osteoporosis  Class 3 severe obesity with serious comorbidity and body mass index (BMI) of 40.0 to 44.9 in adult, unspecified obesity type ([place])  PLAN:  Vitamin D Deficiency Maryse was informed that low vitamin D levels contributes to fatigue and are associated with obesity, breast, and colon cancer. She agrees to continue to take prescription Vit D @50 ,000 IU every week #4 with no refills and will follow up for routine testing of vitamin D, at least 2-3 times per year. She was informed of the risk of over-replacement of vitamin D and agrees to not increase her dose unless she discusses this with [name] first. Iriel agrees to follow up with our clinic in 2 weeks.  At risk for osteopenia and osteoporosis Rhonda Aguilar was given extended  (15 minutes) osteoporosis prevention counseling today. Rhonda Aguilar is at risk for osteopenia and osteoporosis due to her vitamin D deficiency. She was encouraged to take her vitamin D and follow her higher calcium diet and increase strengthening exercise to help strengthen her bones and decrease her risk of osteopenia and osteoporosis.  Diabetes II Rhonda Aguilar has been given extensive diabetes education by myself today including ideal fasting and post-prandial blood glucose readings, individual ideal Hgb A1c goals and hypoglycemia  prevention. We discussed the importance of good blood sugar control to decrease the likelihood of diabetic complications such as nephropathy, neuropathy, limb loss, blindness, coronary artery disease, and death. We discussed the importance of intensive lifestyle modification including diet, exercise and weight loss as the first line treatment for diabetes. Rhonda Aguilar agrees to continue checking her blood sugar (fasting blood sugar and 2 hour post prandial). Rhonda Aguilar agrees to continue her diabetes medications and will follow up at the agreed upon time.  Obesity Rhonda Aguilar is currently in the action stage of change. As such, her goal is to continue with weight loss efforts She has agreed to follow the Category 3 plan Rhonda Aguilar has been instructed to work up to a goal of 150 minutes of combined cardio and strengthening exercise per week or do more walking for weight loss and overall health benefits. We discussed the following Behavioral Modification Strategies today: increase H2O intake, no skipping meals, increasing lean protein intake, decreasing simple carbohydrates , increasing vegetables, work on meal planning and easy cooking plans, holiday eating strategies and travel eating strategies  Rhonda Aguilar has agreed to follow up with our clinic in 2 weeks. She was informed of the importance of frequent follow up visits to maximize her success with intensive lifestyle modifications for her multiple health conditions.   OBESITY BEHAVIORAL INTERVENTION VISIT  Today's visit was # 4   Starting weight: 231 lbs Starting date: 12/08/2017 Today's weight : 221 lbs Today's date: 02/02/2018 Total lbs lost to date: 10   ASK: We discussed the diagnosis of obesity with Rhonda Aguilar today and Rhonda Aguilar agreed to give [name] permission to discuss obesity behavioral modification therapy today.  ASSESS: Rhonda Aguilar has the diagnosis of obesity and her BMI today is 40.41 Rhonda Aguilar is in the action stage of change   ADVISE: Rhonda Aguilar was educated on  the multiple health risks of obesity as well as the benefit of weight loss  to improve her health. She was advised of the need for long term treatment and the importance of lifestyle modifications to improve her current health and to decrease her risk of future health problems.  AGREE: Multiple dietary modification options and treatment options were discussed and  Rhonda Aguilar agreed to follow the recommendations documented in the above note.  ARRANGE: Rhonda Aguilar was educated on the importance of frequent visits to treat obesity as outlined per CMS and USPSTF guidelines and agreed to schedule her next follow up appointment today.  Corey Skains, am acting as Location manager for [employer]. Owens Shark, DO  I have reviewed the above documentation for accuracy and completeness, and I agree with the above. -Jearld Lesch, DO

## 2018-02-16 ENCOUNTER — Ambulatory Visit (INDEPENDENT_AMBULATORY_CARE_PROVIDER_SITE_OTHER): Payer: 59 | Admitting: Neurology

## 2018-02-16 DIAGNOSIS — Z9989 Dependence on other enabling machines and devices: Secondary | ICD-10-CM

## 2018-02-16 DIAGNOSIS — G4733 Obstructive sleep apnea (adult) (pediatric): Secondary | ICD-10-CM | POA: Diagnosis not present

## 2018-02-16 DIAGNOSIS — R0600 Dyspnea, unspecified: Secondary | ICD-10-CM

## 2018-02-16 DIAGNOSIS — R0602 Shortness of breath: Secondary | ICD-10-CM

## 2018-02-16 DIAGNOSIS — Z6841 Body Mass Index (BMI) 40.0 and over, adult: Secondary | ICD-10-CM

## 2018-02-16 DIAGNOSIS — E119 Type 2 diabetes mellitus without complications: Secondary | ICD-10-CM

## 2018-02-16 DIAGNOSIS — R0609 Other forms of dyspnea: Secondary | ICD-10-CM

## 2018-02-16 MED FILL — VIT D2 1.25 MG (50,000 UNIT: 1.25 MG | 28 days supply | Qty: 4 | Fill #0

## 2018-02-23 ENCOUNTER — Encounter (INDEPENDENT_AMBULATORY_CARE_PROVIDER_SITE_OTHER): Payer: Self-pay | Admitting: Bariatrics

## 2018-02-23 ENCOUNTER — Ambulatory Visit (INDEPENDENT_AMBULATORY_CARE_PROVIDER_SITE_OTHER): Payer: 59 | Admitting: Bariatrics

## 2018-02-23 VITALS — BP 126/79 | HR 70 | Temp 97.6°F | Ht 62.0 in | Wt 222.0 lb

## 2018-02-23 DIAGNOSIS — Z6841 Body Mass Index (BMI) 40.0 and over, adult: Secondary | ICD-10-CM | POA: Diagnosis not present

## 2018-02-23 DIAGNOSIS — I10 Essential (primary) hypertension: Secondary | ICD-10-CM

## 2018-02-23 DIAGNOSIS — E119 Type 2 diabetes mellitus without complications: Secondary | ICD-10-CM | POA: Diagnosis not present

## 2018-02-23 DIAGNOSIS — E559 Vitamin D deficiency, unspecified: Secondary | ICD-10-CM | POA: Diagnosis not present

## 2018-02-23 MED FILL — VERAPAMIL ER PM 300 MG CAP: 300 | 90 days supply | Qty: 90 | Fill #0

## 2018-02-23 MED FILL — DEXILANT DR 60 MG CAPSULE: 60 | 30 days supply | Qty: 30 | Fill #6

## 2018-02-23 NOTE — Progress Notes (Signed)
Office: [phone]  /  Fax: [phone]   HPI:   Chief Complaint: OBESITY Rhonda Aguilar is here to discuss her progress with her obesity treatment plan. She is on the Category 3 plan and is following her eating plan approximately 50 % of the time. She states she is exercising 0 minutes 0 times per week. Rhonda Aguilar has struggled slightly during the holidays. She has been craving "butter". Rhonda Aguilar is drinking water well. Her energy level has improved. Her weight is 222 lb (100.7 kg) today and has had a weight gain of 1 pound over a period of 3 weeks since her last visit. She has lost 9 lbs since starting treatment with [name].  Diabetes II Rhonda Aguilar has a diagnosis of diabetes type II. Rhonda Aguilar states fasting BGs range between 120 and 130 and she denies any hypoglycemic episodes. She is not on medications and her appetite is mostly controlled. She has been working on intensive lifestyle modifications including diet, exercise, and weight loss to help control her blood glucose levels.  Vitamin D deficiency [name] has a diagnosis of vitamin D deficiency. She is currently taking high dose vit D and denies nausea, vomiting or muscle weakness.  Hypertension Rhonda Aguilar is a 63 y.o. female with hypertension. She is taking Valsartan and Verapamil. Rhonda Aguilar denies chest pain or shortness of breath on exertion. She is working weight loss to help control her blood pressure with the goal of decreasing her risk of heart attack and stroke. Rhonda Aguilar blood pressure is currently controlled.  ASSESSMENT AND PLAN:  Type 2 diabetes mellitus without complication, without long-term current use of insulin ([name])  Vitamin D deficiency  Essential hypertension  Class 3 severe obesity with serious comorbidity and body mass index (BMI) of 40.0 to 44.9 in adult, unspecified obesity type ([place])  PLAN:  Diabetes II [name] has been given extensive diabetes education by myself today including ideal fasting and post-prandial blood  glucose readings, individual ideal Hgb A1c goals and hypoglycemia prevention. We discussed the importance of good blood sugar control to decrease the likelihood of diabetic complications such as nephropathy, neuropathy, limb loss, blindness, coronary artery disease, and death. We discussed the importance of intensive lifestyle modification including diet, exercise and weight loss as the first line treatment for diabetes. [name] will work on decreasing carbohydrates and increasing lean protein in her diet and will follow up at the agreed upon time.  Vitamin D Deficiency [name] was informed that low vitamin D levels contributes to fatigue and are associated with obesity, breast, and colon cancer. She agrees to continue to take prescription Vit D @50 ,000 IU every week and will follow up for routine testing of vitamin D, at least 2-3 times per year. She was informed of the risk of over-replacement of vitamin D and agrees to not increase her dose unless she discusses this with [name] first. I spent > than 50% of the 15 minute visit on counseling as documented in the note.  Hypertension We discussed sodium restriction, working on healthy weight loss, and a regular exercise program as the means to achieve improved blood pressure control. Rhonda Aguilar agreed with this plan and agreed to follow up as directed. We will continue to monitor her blood pressure as well as her progress with the above lifestyle modifications. She will continue her medications as prescribed and will watch for signs of hypotension as she continues her lifestyle modifications.  I spent > than 50% of the 15 minute visit on counseling as documented in the note.  Obesity Rhonda Aguilar is currently in the action stage of change. As such, her goal is to continue with weight loss efforts She has agreed to follow the Category 3 plan with additional breakfast options Rhonda Aguilar has been instructed to work up to a goal of 150 minutes of combined cardio and strengthening  exercise per week for weight loss and overall health benefits. We discussed the following Behavioral Modification Strategies today: increase H2O intake, keeping healthy foods in the home, increasing lean protein intake, decreasing simple carbohydrates , increasing vegetables and work on meal planning and easy cooking plans  Rhonda Aguilar will start weighing to ensure she gets her protein. Handouts for store bought seasonings and homemade seasonings were provided to patient today.  Rhonda Aguilar has agreed to follow up with our clinic in 2 weeks. She was informed of the importance of frequent follow up visits to maximize her success with intensive lifestyle modifications for her multiple health conditions.  ALLERGIES: Allergies  Allergen Reactions  . Codeine Itching and Rash  . Penicillins Rash    MEDICATIONS: Current Outpatient Medications on File Prior to Visit  Medication Sig Dispense Refill  . ALPRAZolam (XANAX) 0.5 MG tablet Take 0.25-1 mg by mouth every 8 (eight) hours as needed for anxiety.    . busPIRone (BUSPAR) 5 MG tablet Take 5 mg by mouth 2 (two) times daily.    . cetirizine (ZYRTEC) 10 MG tablet Take 10 mg by mouth daily.    . cyclobenzaprine (FLEXERIL) 10 MG tablet Take 10 mg by mouth 3 (three) times daily as needed for muscle spasms.    Rhonda Aguilar dexlansoprazole (DEXILANT) 60 MG capsule Take 60 mg by mouth daily.    Rhonda Aguilar ibuprofen (ADVIL,MOTRIN) 600 MG tablet Take 600 mg by mouth every 6 (six) hours as needed for mild pain.    Rhonda Aguilar Peppermint Oil (IBGARD PO) Take by mouth.    . Probiotic Product (ALIGN) 4 MG CAPS Take by mouth.    . promethazine (PHENERGAN) 12.5 MG tablet Take 1 tablet (12.5 mg total) by mouth every 8 (eight) hours as needed for nausea, vomiting or refractory nausea / vomiting. 60 tablet 3  . valsartan (DIOVAN) 320 MG tablet Take 320 mg by mouth daily.     . Verapamil HCl CR 300 MG CP24 Take 300 mg by mouth at bedtime.     . Vitamin D, Ergocalciferol, (DRISDOL) 1.25 MG (50000 UT) CAPS  capsule Take 1 capsule (50,000 Units total) by mouth every 7 (seven) days. 4 capsule 0   No current facility-administered medications on file prior to visit.     PAST MEDICAL HISTORY: Past Medical History:  Diagnosis Date  . Benign essential HTN 11/22/2014  . Diabetes mellitus, type II ([place])   . Fatty liver   . GERD (gastroesophageal reflux disease)   . Hypertension   . IBS (irritable bowel syndrome)   . Migraine   . Obesity (BMI 30-39.9) 11/22/2014  . OSA (obstructive sleep apnea)     PAST SURGICAL HISTORY: Past Surgical History:  Procedure Laterality Date  . COLONOSCOPY    . TONSILLECTOMY      SOCIAL HISTORY: Social History   Tobacco Use  . Smoking status: Never Smoker  . Smokeless tobacco: Never Used  Substance Use Topics  . Alcohol use: Yes    Alcohol/week: 0.0 standard drinks  . Drug use: Not on file    FAMILY HISTORY: Family History  Problem Relation Age of Onset  . Diabetes Mother   . Heart disease Mother   .  Hypertension Mother   . High Cholesterol Mother   . Kidney disease Mother   . Depression Mother   . Obesity Mother   . Coronary artery disease Father   . High blood pressure Father   . High Cholesterol Father   . Depression Father   . Hypertension Brother     ROS: Review of Systems  Constitutional: Negative for weight loss.  Respiratory: Negative for shortness of breath (on exertion).   Cardiovascular: Negative for chest pain.  Gastrointestinal: Negative for nausea and vomiting.  Musculoskeletal:       Negative for muscle weakness  Endo/Heme/Allergies:       Negative for hypoglycemia    PHYSICAL EXAM: Pulse 70, temperature 97.6 F (36.4 C), temperature source Oral, height 5\' 2"  (1.575 m), weight 222 lb (100.7 kg), SpO2 94 %. Body mass index is 40.6 kg/m. Physical Exam Vitals signs reviewed.  Constitutional:      Appearance: Normal appearance. She is well-developed. She is obese.  Cardiovascular:     Rate and Rhythm: Normal rate.    Pulmonary:     Effort: Pulmonary effort is normal.  Musculoskeletal: Normal range of motion.  Skin:    General: Skin is warm and dry.  Neurological:     Mental Status: She is alert and oriented to person, place, and time.  Psychiatric:        Mood and Affect: Mood normal.        Behavior: Behavior normal.     RECENT LABS AND TESTS: BMET    Component Value Date/Time   NA 138 12/08/2017 1306   K 4.1 12/08/2017 1306   CL 101 12/08/2017 1306   CO2 18 (L) 12/08/2017 1306   GLUCOSE 122 (H) 12/08/2017 1306   BUN 17 12/08/2017 1306   CREATININE 0.73 12/08/2017 1306   CALCIUM 9.2 12/08/2017 1306   GFRNONAA 87 12/08/2017 1306   GFRAA 101 12/08/2017 1306   No results found for: HGBA1C Lab Results  Component Value Date   INSULIN 38.2 (H) 12/08/2017   CBC    Component Value Date/Time   WBC 8.5 12/08/2017 1306   WBC 16.3 (H) 12/19/2014 1120   RBC 4.64 12/08/2017 1306   RBC 4.92 12/19/2014 1120   HGB 12.9 12/08/2017 1306   HCT 38.1 12/08/2017 1306   PLT 263 12/19/2014 1120   MCV 82 12/08/2017 1306   MCH 27.8 12/08/2017 1306   MCH 27.8 12/19/2014 1120   MCHC 33.9 12/08/2017 1306   MCHC 33.5 12/19/2014 1120   RDW 14.0 12/08/2017 1306   LYMPHSABS 2.5 12/08/2017 1306   MONOABS 1.5 (H) 12/19/2014 1120   EOSABS 0.2 12/08/2017 1306   BASOSABS 0.0 12/08/2017 1306   Iron/TIBC/Ferritin/ %Sat No results found for: IRON, TIBC, FERRITIN, IRONPCTSAT Lipid Panel     Component Value Date/Time   CHOL 197 12/08/2017 1306   TRIG 148 12/08/2017 1306   HDL 42 12/08/2017 1306   LDLCALC 125 (H) 12/08/2017 1306   Hepatic Function Panel     Component Value Date/Time   PROT 6.7 12/08/2017 1306   ALBUMIN 4.4 12/08/2017 1306   AST 90 (H) 12/08/2017 1306   ALT 118 (H) 12/08/2017 1306   ALKPHOS 86 12/08/2017 1306   BILITOT 0.5 12/08/2017 1306      Component Value Date/Time   TSH 2.550 12/08/2017 1306   Results for JODEL, MAYHALL (MRN [id number]) as of 02/23/2018 17:33  Ref. Range  12/08/2017 13:06  Vitamin D, 25-Hydroxy Latest Ref Range: 30.0 - 100.0  ng/mL 14.4 (L)     OBESITY BEHAVIORAL INTERVENTION VISIT  Today's visit was # 6   Starting weight: 231 lbs Starting date: 12/08/2017 Today's weight : 222 lbs Today's date: 02/23/2018 Total lbs lost to date: 9   ASK: We discussed the diagnosis of obesity with Isaiah Serge today and Abbee agreed to give [name] permission to discuss obesity behavioral modification therapy today.  ASSESS: Kiyo has the diagnosis of obesity and her BMI today is 40.59 Lerlene is in the action stage of change   ADVISE: Teleah was educated on the multiple health risks of obesity as well as the benefit of weight loss to improve her health. She was advised of the need for long term treatment and the importance of lifestyle modifications to improve her current health and to decrease her risk of future health problems.  AGREE: Multiple dietary modification options and treatment options were discussed and  Sandar agreed to follow the recommendations documented in the above note.  ARRANGE: Whisper was educated on the importance of frequent visits to treat obesity as outlined per CMS and USPSTF guidelines and agreed to schedule her next follow up appointment today.  Corey Skains, am acting as Location manager for [employer]. Owens Shark, DO  I have reviewed the above documentation for accuracy and completeness, and I agree with the above. -Jearld Lesch, DO

## 2018-03-02 MED FILL — VALSARTAN 320 MG TAB: 320 | 90 days supply | Qty: 90 | Fill #0

## 2018-03-07 NOTE — Patient Instructions (Signed)
NAME:   [name]. [place] , [place]                                                           DOB: 01-Mar-1955 MEDICAL RECORD No:     [id number]                                             DOS:  02/16/2018 REFERRING PHYSICIAN: Darcus Austin, Rhonda Aguilar STUDY PERFORMED: Home Sleep Test on Watch Pat HISTORY:   Rhonda Aguilar is a 63 y.o. female patient, seen on 01-17-2018 in a referral from Dr. Inda Merlin.  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  She wants to make sure that the machine is still working and that the settings are appropriate for her, as she is due for a new one. She was diagnosed with diabetes this summer for started on oral metformin after which she developed a ocular migraine that was new to her.   Migraines have been present in the past they were well controlled for a long time, but now she has a new olfactory aura she did not experience before she has developed numbness in one hand and the left arm after taking glipizide and she also wonders if the migraine development is due to the diabetic treatment or an entirely new phenomenon. She has been referred to medical weight management with Dr. Leafy Ro and is doing well.  Epworth sleepiness score was endorsed at only 6/ 24 possible points, fatigue severity score at 30 / 63 points, Geriatric depression score at 3 / 15 points. Snoring through CPAP.  BMI: 41.8  STUDY RESULTS:  Total Recording Time: 8 h 14 mins; Valid Sleep Time presumed: 6 h 36mins Total Apnea/Hypopnea Index (AHI):  24.9 /h; RDI: 30.2 /h; REM AHI: 52.3/h Average Oxygen Saturation: 94%; Lowest Oxygen Saturation: 79 %  Total Time Oxygen in Saturation below 89 %: 11.4 minutes  Average Heart Rate: 60 bpm (between 49 and125 bpm), regular. IMPRESSION:  Moderate Sleep apnea, Obstructive type. Strongly accentuated in REM sleep.   RECOMMENDATION: CPAP  therapy to be continued,  I plan to order an autotitration device 6-16 cm water pressure with 2 cm EPR and mask of patients choice, using heated humidity.  I certify that I have reviewed the raw data recording prior to the issuance of this report in accordance with the standards of the American Academy of Sleep Medicine (AASM). Rhonda Aguilar, M.D.     03-07-2018    Medical Director of [facility] at [facility], accredited by the AASM. Diplomat of the ABPN and ABSM.

## 2018-03-07 NOTE — Addendum Note (Signed)
Addended by: Larey Seat on: 03/07/2018 05:23 PM   Modules accepted: Orders

## 2018-03-07 NOTE — Procedures (Signed)
NAME:   Rhonda Aguilar. [place] , [place]                                                            DOB: 31-Jul-1955 MEDICAL RECORD No:  [id number]                                            DOS:  02/16/2018 REFERRING PHYSICIAN: Darcus Austin, MD  STUDY PERFORMED: Home Sleep Test on Watch Pat HISTORY:   [name] is a 63 y.o. female patient, seen on 01-17-2018 in a referral from Dr. Inda Merlin.  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  She wants to make sure that the machine is still working and that the settings are appropriate for her, as she is due for a new one. She was diagnosed with diabetes this summer for started on oral metformin after which she developed a ocular migraine that was new to her.   Migraines have been present in the past they were well controlled for a long time, but now she has a new olfactory aura she did not experience before she has developed numbness in one hand and the left arm after taking glipizide and she also wonders if the migraine development is due to the diabetic treatment or an entirely new phenomenon. She has been referred to medical weight management with Dr. Leafy Ro and is doing well.  Epworth sleepiness score was endorsed at only 6/ 24 possible points, fatigue severity score at 30 / 63 points, Geriatric depression score at 3 / 15 points. Snoring through CPAP.  BMI: 41.8  STUDY RESULTS:  Total Recording Time: 8 h 14 mins; Valid Sleep Time presumed: 6 h 61mins Total Apnea/Hypopnea Index (AHI):  24.9 /h; RDI: 30.2 /h; REM AHI: 52.3/h Average Oxygen Saturation: 94%; Lowest Oxygen Saturation: 79 %  Total Time Oxygen in Saturation below 89 %: 11.4 minutes  Average Heart Rate: 60 bpm (between 49 and125 bpm), regular. IMPRESSION:  Moderate Sleep apnea, Obstructive type. Strongly accentuated in REM sleep.   RECOMMENDATION: CPAP  therapy to be continued,  I plan to order an autotitration device 6-16 cm water pressure with 2 cm EPR and mask of patients choice, using heated humidity.  I certify that I have reviewed the raw data recording prior to the issuance of this report in accordance with the standards of the American Academy of Sleep Medicine (AASM). Larey Seat, M.D.     03-07-2018    Medical Director of [facility] at [facility], accredited by the AASM. Diplomat of the ABPN and ABSM.

## 2018-03-08 ENCOUNTER — Telehealth: Payer: Self-pay | Admitting: Neurology

## 2018-03-08 ENCOUNTER — Encounter (INDEPENDENT_AMBULATORY_CARE_PROVIDER_SITE_OTHER): Payer: Self-pay | Admitting: Bariatrics

## 2018-03-08 ENCOUNTER — Ambulatory Visit (INDEPENDENT_AMBULATORY_CARE_PROVIDER_SITE_OTHER): Payer: 59 | Admitting: Bariatrics

## 2018-03-08 VITALS — BP 129/63 | HR 65 | Temp 98.0°F | Ht 62.0 in | Wt 218.0 lb

## 2018-03-08 DIAGNOSIS — Z9189 Other specified personal risk factors, not elsewhere classified: Secondary | ICD-10-CM | POA: Diagnosis not present

## 2018-03-08 DIAGNOSIS — E119 Type 2 diabetes mellitus without complications: Secondary | ICD-10-CM

## 2018-03-08 DIAGNOSIS — Z6841 Body Mass Index (BMI) 40.0 and over, adult: Secondary | ICD-10-CM

## 2018-03-08 DIAGNOSIS — F341 Dysthymic disorder: Secondary | ICD-10-CM | POA: Diagnosis not present

## 2018-03-08 DIAGNOSIS — E559 Vitamin D deficiency, unspecified: Secondary | ICD-10-CM | POA: Diagnosis not present

## 2018-03-08 MED ORDER — VITAMIN D (ERGOCALCIFEROL) 1.25 MG (50000 UNIT) PO CAPS: 50000.0000 [IU] | ORAL_CAPSULE | ORAL | 0 refills | Status: DC

## 2018-03-08 MED ORDER — METFORMIN HCL 500 MG PO TABS: 250.0000 mg | ORAL_TABLET | Freq: Every day | ORAL | 0 refills | Status: DC

## 2018-03-08 MED FILL — metFORMIN HCL 500 MG TABS: 500 | 60 days supply | Qty: 30 | Fill #0

## 2018-03-08 NOTE — Telephone Encounter (Signed)
I called pt. I advised pt that Dr. Brett Fairy reviewed their sleep study results and found that pt has sleep apnea. Dr. Brett Fairy recommends that pt continue using CPAP. New orders for a new auto CPAP 6-16 cm water pressure. I reviewed PAP compliance expectations with the pt. Pt is agreeable to starting a CPAP. I advised pt that an order will be sent to a DME, [facility], and [facility] will call the pt within about one week after they file with the pt's insurance. [facility] will show the pt how to use the machine, fit for masks, and troubleshoot the CPAP if needed. A follow up appt was made for insurance purposes with Dan Humphreys, NP on March 13,2020 at 11:00 am. Pt verbalized understanding to arrive 15 minutes early and bring their CPAP. A letter with all of this information in it will be mailed to the pt as a reminder. I verified with the pt that the address we have on file is correct. Pt verbalized understanding of results. Pt had no questions at this time but was encouraged to call back if questions arise. I have sent the order to [facility] and have received confirmation that they have received the order.

## 2018-03-08 NOTE — Telephone Encounter (Signed)
-----   Message from Larey Seat, MD sent at 03/07/2018  5:23 PM EST ----- PA Dorene Ar will still need CPAP and a dental device is not a good choice in REM accentuated sleep apnea. I will order a machine of her choice, CPAP autotitration device.   IMPRESSION: Moderate Sleep apnea, Obstructive type. Strongly  accentuated in REM sleep.  RECOMMENDATION: CPAP therapy to be continued,  I plan to order an autotitration device 6-16 cm water pressure  with 2 cm EPR and mask of patients choice, using heated humidity.

## 2018-03-09 NOTE — Progress Notes (Signed)
Office: [phone]  /  Fax: [phone]   HPI:   Chief Complaint: OBESITY Rhonda Aguilar is here to discuss her progress with her obesity treatment plan. She is on the Category 3 plan with additional breakfast options and is following her eating plan approximately 80 % of the time. She states she is exercising 3,000 to 5,000 steps a day 4 times per week. Rhonda Aguilar  Is doing well with her diet overall. She has been increasing her water intake and she has increased her protein. Rhonda Aguilar is using "FitBit" now and she is counting her steps. Her weight is 218 lb (98.9 kg) today and has had a weight loss of 4 pounds over a period of 2 weeks since her last visit. She has lost 13 lbs since starting treatment with [name].  Vitamin D deficiency Rhonda Aguilar has a diagnosis of vitamin D deficiency. She is currently taking vit D and denies nausea, vomiting or muscle weakness.  At risk for osteopenia and osteoporosis Rhonda Aguilar is at higher risk of osteopenia and osteoporosis due to vitamin D deficiency.   Diabetes II Rhonda Aguilar has a diagnosis of diabetes type II. She is not on medications. Rhonda Aguilar states fasting BGs range between 110 and 120's and she is not checking her post prandial BGs. Rhonda Aguilar denies any hypoglycemic episodes. She has been working on intensive lifestyle modifications including diet, exercise, and weight loss to help control her blood glucose levels.  ASSESSMENT AND PLAN:  Vitamin D deficiency - Plan: Vitamin D, Ergocalciferol, (DRISDOL) 1.25 MG (50000 UT) CAPS capsule  Type 2 diabetes mellitus without complication, without long-term current use of insulin ([name]) - Plan: metFORMIN (GLUCOPHAGE) 500 MG tablet  At risk for osteoporosis  Class 3 severe obesity with serious comorbidity and body mass index (BMI) of 40.0 to 44.9 in adult, unspecified obesity type ([place])  PLAN:  Vitamin D Deficiency Rhonda Aguilar was informed that low vitamin D levels contributes to fatigue and are associated with obesity, breast, and colon  cancer. She agrees to continue to take prescription Vit D @50 ,000 IU every week #4 with no refills and will follow up for routine testing of vitamin D, at least 2-3 times per year. She was informed of the risk of over-replacement of vitamin D and agrees to not increase her dose unless she discusses this with [name] first. Rhonda Aguilar agrees to follow up as directed.  At risk for osteopenia and osteoporosis Rhonda Aguilar was given extended  (15 minutes) osteoporosis prevention counseling today. Rhonda Aguilar is at risk for osteopenia and osteoporosis due to her vitamin D deficiency. She was encouraged to take her vitamin D and follow her higher calcium diet and increase strengthening exercise to help strengthen her bones and decrease her risk of osteopenia and osteoporosis.  Diabetes II Rhonda Aguilar has been given extensive diabetes education by myself today including ideal fasting and post-prandial blood glucose readings, individual ideal Hgb A1c goals and hypoglycemia prevention. We discussed the importance of good blood sugar control to decrease the likelihood of diabetic complications such as nephropathy, neuropathy, limb loss, blindness, coronary artery disease, and death. We discussed the importance of intensive lifestyle modification including diet, exercise and weight loss as the first line treatment for diabetes. Rhonda Aguilar agrees to start metformin 250 mg once daily #30 with no refills and follow up at the agreed upon time.  Obesity Rhonda Aguilar is currently in the action stage of change. As such, her goal is to continue with weight loss efforts She has agreed to follow the Category 3 plan Rhonda Aguilar will start  walking on a regular basis for weight loss and overall health benefits. We discussed the following Behavioral Modification Strategies today: increase H2O intake, keeping healthy foods in the home, increasing lean protein intake, decreasing simple carbohydrates, increasing vegetables and work on meal planning and easy cooking  plans  Rhonda Aguilar has agreed to follow up with our clinic in 2 weeks. She was informed of the importance of frequent follow up visits to maximize her success with intensive lifestyle modifications for her multiple health conditions.  ALLERGIES: Allergies  Allergen Reactions  . Codeine Itching and Rash  . Penicillins Rash    MEDICATIONS: Current Outpatient Medications on File Prior to Visit  Medication Sig Dispense Refill  . ALPRAZolam (XANAX) 0.5 MG tablet Take 0.25-1 mg by mouth every 8 (eight) hours as needed for anxiety.    . busPIRone (BUSPAR) 5 MG tablet Take 5 mg by mouth 2 (two) times daily.    . cetirizine (ZYRTEC) 10 MG tablet Take 10 mg by mouth daily.    . cyclobenzaprine (FLEXERIL) 10 MG tablet Take 10 mg by mouth 3 (three) times daily as needed for muscle spasms.    [name] dexlansoprazole (DEXILANT) 60 MG capsule Take 60 mg by mouth daily.    [name] ibuprofen (ADVIL,MOTRIN) 600 MG tablet Take 600 mg by mouth every 6 (six) hours as needed for mild pain.    [name] Peppermint Oil (IBGARD PO) Take by mouth.    . Probiotic Product (ALIGN) 4 MG CAPS Take by mouth.    . promethazine (PHENERGAN) 12.5 MG tablet Take 1 tablet (12.5 mg total) by mouth every 8 (eight) hours as needed for nausea, vomiting or refractory nausea / vomiting. 60 tablet 3  . valsartan (DIOVAN) 320 MG tablet Take 320 mg by mouth daily.     . Verapamil HCl CR 300 MG CP24 Take 300 mg by mouth at bedtime.      No current facility-administered medications on file prior to visit.     PAST MEDICAL HISTORY: Past Medical History:  Diagnosis Date  . Benign essential HTN 11/22/2014  . Diabetes mellitus, type II ([place])   . Fatty liver   . GERD (gastroesophageal reflux disease)   . Hypertension   . IBS (irritable bowel syndrome)   . Migraine   . Obesity (BMI 30-39.9) 11/22/2014  . OSA (obstructive sleep apnea)     PAST SURGICAL HISTORY: Past Surgical History:  Procedure Laterality Date  . COLONOSCOPY    . TONSILLECTOMY       SOCIAL HISTORY: Social History   Tobacco Use  . Smoking status: Never Smoker  . Smokeless tobacco: Never Used  Substance Use Topics  . Alcohol use: Yes    Alcohol/week: 0.0 standard drinks  . Drug use: Not on file    FAMILY HISTORY: Family History  Problem Relation Age of Onset  . Diabetes Mother   . Heart disease Mother   . Hypertension Mother   . High Cholesterol Mother   . Kidney disease Mother   . Depression Mother   . Obesity Mother   . Coronary artery disease Father   . High blood pressure Father   . High Cholesterol Father   . Depression Father   . Hypertension Brother     ROS: Review of Systems  Constitutional: Positive for weight loss.  Gastrointestinal: Negative for nausea and vomiting.  Musculoskeletal: Negative for myalgias.       Negative for muscle weakness  Endo/Heme/Allergies:       Negative for hypoglycemia  PHYSICAL EXAM: Blood pressure 129/63, pulse 65, temperature 98 F (36.7 C), temperature source Oral, height 5\' 2"  (1.575 m), weight 218 lb (98.9 kg), SpO2 94 %. Body mass index is 39.87 kg/m. Physical Exam Vitals signs reviewed.  Constitutional:      Appearance: Normal appearance. She is well-developed. She is obese.  Cardiovascular:     Rate and Rhythm: Normal rate.  Pulmonary:     Effort: Pulmonary effort is normal.  Musculoskeletal: Normal range of motion.  Skin:    General: Skin is warm and dry.  Neurological:     Mental Status: She is alert and oriented to person, place, and time.  Psychiatric:        Mood and Affect: Mood normal.        Behavior: Behavior normal.     RECENT LABS AND TESTS: BMET    Component Value Date/Time   NA 138 12/08/2017 1306   K 4.1 12/08/2017 1306   CL 101 12/08/2017 1306   CO2 18 (L) 12/08/2017 1306   GLUCOSE 122 (H) 12/08/2017 1306   BUN 17 12/08/2017 1306   CREATININE 0.73 12/08/2017 1306   CALCIUM 9.2 12/08/2017 1306   GFRNONAA 87 12/08/2017 1306   GFRAA 101 12/08/2017 1306    No results found for: HGBA1C Lab Results  Component Value Date   INSULIN 38.2 (H) 12/08/2017   CBC    Component Value Date/Time   WBC 8.5 12/08/2017 1306   WBC 16.3 (H) 12/19/2014 1120   RBC 4.64 12/08/2017 1306   RBC 4.92 12/19/2014 1120   HGB 12.9 12/08/2017 1306   HCT 38.1 12/08/2017 1306   PLT 263 12/19/2014 1120   MCV 82 12/08/2017 1306   MCH 27.8 12/08/2017 1306   MCH 27.8 12/19/2014 1120   MCHC 33.9 12/08/2017 1306   MCHC 33.5 12/19/2014 1120   RDW 14.0 12/08/2017 1306   LYMPHSABS 2.5 12/08/2017 1306   MONOABS 1.5 (H) 12/19/2014 1120   EOSABS 0.2 12/08/2017 1306   BASOSABS 0.0 12/08/2017 1306   Iron/TIBC/Ferritin/ %Sat No results found for: IRON, TIBC, FERRITIN, IRONPCTSAT Lipid Panel     Component Value Date/Time   CHOL 197 12/08/2017 1306   TRIG 148 12/08/2017 1306   HDL 42 12/08/2017 1306   LDLCALC 125 (H) 12/08/2017 1306   Hepatic Function Panel     Component Value Date/Time   PROT 6.7 12/08/2017 1306   ALBUMIN 4.4 12/08/2017 1306   AST 90 (H) 12/08/2017 1306   ALT 118 (H) 12/08/2017 1306   ALKPHOS 86 12/08/2017 1306   BILITOT 0.5 12/08/2017 1306      Component Value Date/Time   TSH 2.550 12/08/2017 1306     Ref. Range 12/08/2017 13:06  Vitamin D, 25-Hydroxy Latest Ref Range: 30.0 - 100.0 ng/mL 14.4 (L)     OBESITY BEHAVIORAL INTERVENTION VISIT  Today's visit was # 7   Starting weight: 231 lbs Starting date: 12/08/2017 Today's weight : 218 lbs Today's date: 03/08/2018 Total lbs lost to date: 13   ASK: We discussed the diagnosis of obesity with Isaiah Serge today and Rubyann agreed to give [name] permission to discuss obesity behavioral modification therapy today.  ASSESS: Rhonda Aguilar has the diagnosis of obesity and her BMI today is 39.86 Kerline is in the action stage of change   ADVISE: Shiori was educated on the multiple health risks of obesity as well as the benefit of weight loss to improve her health. She was advised of the need for  long term treatment  and the importance of lifestyle modifications to improve her current health and to decrease her risk of future health problems.  AGREE: Multiple dietary modification options and treatment options were discussed and  Josiah agreed to follow the recommendations documented in the above note.  ARRANGE: Jadalee was educated on the importance of frequent visits to treat obesity as outlined per CMS and USPSTF guidelines and agreed to schedule her next follow up appointment today.  Corey Skains, am acting as Location manager for [employer]. Owens Shark, DO  I have reviewed the above documentation for accuracy and completeness, and I agree with the above. -Jearld Lesch, DO

## 2018-03-10 ENCOUNTER — Other Ambulatory Visit (INDEPENDENT_AMBULATORY_CARE_PROVIDER_SITE_OTHER): Payer: Self-pay | Admitting: Bariatrics

## 2018-03-10 DIAGNOSIS — E559 Vitamin D deficiency, unspecified: Secondary | ICD-10-CM

## 2018-03-13 ENCOUNTER — Encounter (INDEPENDENT_AMBULATORY_CARE_PROVIDER_SITE_OTHER): Payer: Self-pay | Admitting: Bariatrics

## 2018-03-14 ENCOUNTER — Other Ambulatory Visit (INDEPENDENT_AMBULATORY_CARE_PROVIDER_SITE_OTHER): Payer: Self-pay | Admitting: Bariatrics

## 2018-03-14 ENCOUNTER — Encounter (INDEPENDENT_AMBULATORY_CARE_PROVIDER_SITE_OTHER): Payer: Self-pay | Admitting: Bariatrics

## 2018-03-14 ENCOUNTER — Other Ambulatory Visit (INDEPENDENT_AMBULATORY_CARE_PROVIDER_SITE_OTHER): Payer: Self-pay

## 2018-03-14 DIAGNOSIS — E559 Vitamin D deficiency, unspecified: Secondary | ICD-10-CM

## 2018-03-14 MED ORDER — VITAMIN D (ERGOCALCIFEROL) 1.25 MG (50000 UNIT) PO CAPS: 50000.0000 [IU] | ORAL_CAPSULE | ORAL | 0 refills | Status: DC

## 2018-03-15 ENCOUNTER — Encounter (INDEPENDENT_AMBULATORY_CARE_PROVIDER_SITE_OTHER): Payer: Self-pay | Admitting: Bariatrics

## 2018-03-15 ENCOUNTER — Other Ambulatory Visit (INDEPENDENT_AMBULATORY_CARE_PROVIDER_SITE_OTHER): Payer: Self-pay | Admitting: Bariatrics

## 2018-03-15 DIAGNOSIS — E559 Vitamin D deficiency, unspecified: Secondary | ICD-10-CM

## 2018-03-15 MED FILL — VIT D2 1.25 MG (50,000 UNIT: 1.25 MG | 28 days supply | Qty: 4 | Fill #0

## 2018-03-21 ENCOUNTER — Encounter (INDEPENDENT_AMBULATORY_CARE_PROVIDER_SITE_OTHER): Payer: Self-pay | Admitting: Family Medicine

## 2018-03-21 ENCOUNTER — Ambulatory Visit (INDEPENDENT_AMBULATORY_CARE_PROVIDER_SITE_OTHER): Payer: 59 | Admitting: Family Medicine

## 2018-03-21 VITALS — BP 122/74 | HR 77 | Temp 97.9°F | Ht 62.0 in | Wt 219.0 lb

## 2018-03-21 DIAGNOSIS — Z9189 Other specified personal risk factors, not elsewhere classified: Secondary | ICD-10-CM

## 2018-03-21 DIAGNOSIS — K760 Fatty (change of) liver, not elsewhere classified: Secondary | ICD-10-CM | POA: Diagnosis not present

## 2018-03-21 DIAGNOSIS — E119 Type 2 diabetes mellitus without complications: Secondary | ICD-10-CM

## 2018-03-21 DIAGNOSIS — E7849 Other hyperlipidemia: Secondary | ICD-10-CM

## 2018-03-21 DIAGNOSIS — F3289 Other specified depressive episodes: Secondary | ICD-10-CM | POA: Diagnosis not present

## 2018-03-21 DIAGNOSIS — G4733 Obstructive sleep apnea (adult) (pediatric): Secondary | ICD-10-CM | POA: Diagnosis not present

## 2018-03-21 DIAGNOSIS — Z6841 Body Mass Index (BMI) 40.0 and over, adult: Secondary | ICD-10-CM

## 2018-03-21 MED ORDER — SEMAGLUTIDE 3 MG PO TABS: 1.0000 | ORAL_TABLET | Freq: Every day | ORAL | 0 refills | Status: DC

## 2018-03-22 ENCOUNTER — Encounter (INDEPENDENT_AMBULATORY_CARE_PROVIDER_SITE_OTHER): Payer: Self-pay | Admitting: Family Medicine

## 2018-03-22 DIAGNOSIS — F32A Depression, unspecified: Secondary | ICD-10-CM | POA: Insufficient documentation

## 2018-03-22 DIAGNOSIS — F329 Major depressive disorder, single episode, unspecified: Secondary | ICD-10-CM | POA: Insufficient documentation

## 2018-03-22 DIAGNOSIS — G4733 Obstructive sleep apnea (adult) (pediatric): Secondary | ICD-10-CM | POA: Diagnosis not present

## 2018-03-22 DIAGNOSIS — K760 Fatty (change of) liver, not elsewhere classified: Secondary | ICD-10-CM | POA: Insufficient documentation

## 2018-03-22 NOTE — Progress Notes (Signed)
Office: [phone]  /  Fax: [phone]   HPI:   Chief Complaint: OBESITY Rhonda Aguilar is here to discuss her progress with her obesity treatment plan. She is on the Category 3 plan and is following her eating plan approximately 75 % of the time. She states she is walking 3,000 to 7,000 steps 6 times per week during the course of her workday mostly. Rhonda Aguilar is eating all the food on her plan.  Her weight is 219 lb (99.3 kg) today and has had a weight gain of 1 pound over a period of 2 weeks since her last visit. She has lost 12 lbs since starting treatment with [name].  Diabetes II Rhonda Aguilar has a diagnosis of diabetes type II. Rhonda Aguilar states that she is not taking metformin because of side effect of ocular migraine.  Her fasting blood sugar is 105 to 129 and 2 hour post prandial sugars range from 102 to 116. Last A1c was 6.2 in December 2019 (not in Epic). She has been working on intensive lifestyle modifications including diet, exercise, and weight loss to help control her blood glucose levels.   Fatty Liver CT of abdomen (10/28/17) showed moderate fatty liver.  Rhonda Aguilar's liver function tests were improved when checked in December with her PCP. These values are not in Epic.   Depression with emotional eating behaviors Rhonda Aguilar admits to stress eating and using food for comfort to the extent that it is negatively impacting her health. She has increased stress related to family issues.  She is not a candidate for bupropion because it causes migraines. Rhonda Aguilar sometimes feels she is out of control and then feels guilty that she made poor food choices. She has been working on behavior modification techniques to help reduce her emotional eating and has been somewhat successful.   Hyperlipidemia Rhonda Aguilar has hyperlipidemia and has been trying to improve her cholesterol levels with intensive lifestyle modification including a low saturated fat diet, exercise and weight loss. Her last LDL was 125 in December 2019. We  discussed that she should be on a statin because of diabetes. She will consider this, but she has had side effects from statins in the past. Rhonda Aguilar had some past testing that shows she has less harmful small LDL particles.  At risk for cardiovascular disease Rhonda Aguilar is at a higher than average risk for cardiovascular disease due to diabetes, hyperlipidemia, and obesity. She currently denies any chest pain.  ASSESSMENT AND PLAN:  Type 2 diabetes mellitus without complication, without long-term current use of insulin ([name]) - Plan: Semaglutide (RYBELSUS) 3 MG TABS  Fatty liver  Other hyperlipidemia  Other depression - with emotional eating   At risk for heart disease  Class 3 severe obesity with serious comorbidity and body mass index (BMI) of 40.0 to 44.9 in adult, unspecified obesity type ([place])  PLAN:  Diabetes II Rhonda Aguilar has been given extensive diabetes education by myself today including ideal fasting and post-prandial blood glucose readings, individual ideal Hgb A1c goals, and hypoglycemia prevention. We discussed the importance of good blood sugar control to decrease the likelihood of diabetic complications such as nephropathy, neuropathy, limb loss, blindness, coronary artery disease, and death. We discussed the importance of intensive lifestyle modification including diet, exercise and weight loss as the first line treatment for diabetes. Rhonda Aguilar agrees to take Rybelsus 3 mg qd #30 with no refills and will follow up at the agreed upon time in 2 to 3 weeks.  Fatty Liver Rhonda Aguilar agrees to continue with her meal  plan and follow up as directed.  Hyperlipidemia Rhonda Aguilar was informed of the American Heart Association Guidelines emphasizing intensive lifestyle modifications as the first line treatment for hyperlipidemia. We discussed many lifestyle modifications today in depth, and Rhonda Aguilar will continue to work on decreasing saturated fats such as fatty red meat, butter and many fried foods. She will  also increase vegetables and lean protein in her diet and continue to work on exercise and weight loss efforts. Rhonda Aguilar agrees to continue her meal plan and she will discuss a statin with her PCP. She agrees to follow up in 2 to 3 weeks.  Depression with Emotional Eating Behaviors We discussed behavior modification techniques today to help Rhonda Aguilar deal with her emotional eating and depression. She was given a handout about Emotional versus Physical Hunger and she will follow up as directed.  Obesity Rhonda Aguilar is currently in the action stage of change. As such, her goal is to continue with weight loss efforts. She has agreed to follow the Category 3 plan. Rhonda Aguilar has been instructed to walk briskly for 30 minutes twice weekly. We discussed the following Behavioral Modification Strategies today: increasing lean protein intake and planning for success.  Rhonda Aguilar has agreed to follow up with our clinic in 2 to 3 weeks. She was informed of the importance of frequent follow up visits to maximize her success with intensive lifestyle modifications for her multiple health conditions.  ALLERGIES: Allergies  Allergen Reactions  . Codeine Itching and Rash  . Penicillins Rash    MEDICATIONS: Current Outpatient Medications on File Prior to Visit  Medication Sig Dispense Refill  . ALPRAZolam (XANAX) 0.5 MG tablet Take 0.25-1 mg by mouth every 8 (eight) hours as needed for anxiety.    . busPIRone (BUSPAR) 5 MG tablet Take 5 mg by mouth 2 (two) times daily.    . cetirizine (ZYRTEC) 10 MG tablet Take 10 mg by mouth daily.    . cyclobenzaprine (FLEXERIL) 10 MG tablet Take 10 mg by mouth 3 (three) times daily as needed for muscle spasms.    [name] dexlansoprazole (DEXILANT) 60 MG capsule Take 60 mg by mouth daily.    [name] ibuprofen (ADVIL,MOTRIN) 600 MG tablet Take 600 mg by mouth every 6 (six) hours as needed for mild pain.    [name] Peppermint Oil (IBGARD PO) Take by mouth.    . Probiotic Product (ALIGN) 4 MG CAPS Take by  mouth.    . promethazine (PHENERGAN) 12.5 MG tablet Take 1 tablet (12.5 mg total) by mouth every 8 (eight) hours as needed for nausea, vomiting or refractory nausea / vomiting. 60 tablet 3  . valsartan (DIOVAN) 320 MG tablet Take 320 mg by mouth daily.     . Verapamil HCl CR 300 MG CP24 Take 300 mg by mouth at bedtime.     . Vitamin D, Ergocalciferol, (DRISDOL) 1.25 MG (50000 UT) CAPS capsule Take 1 capsule (50,000 Units total) by mouth every 7 (seven) days. 4 capsule 0   No current facility-administered medications on file prior to visit.     PAST MEDICAL HISTORY: Past Medical History:  Diagnosis Date  . Benign essential HTN 11/22/2014  . Diabetes mellitus, type II ([place])   . Fatty liver   . GERD (gastroesophageal reflux disease)   . Hypertension   . IBS (irritable bowel syndrome)   . Migraine   . Obesity (BMI 30-39.9) 11/22/2014  . OSA (obstructive sleep apnea)     PAST SURGICAL HISTORY: Past Surgical History:  Procedure Laterality Date  .  COLONOSCOPY    . TONSILLECTOMY      SOCIAL HISTORY: Social History   Tobacco Use  . Smoking status: Never Smoker  . Smokeless tobacco: Never Used  Substance Use Topics  . Alcohol use: Yes    Alcohol/week: 0.0 standard drinks  . Drug use: Not on file    FAMILY HISTORY: Family History  Problem Relation Age of Onset  . Diabetes Mother   . Heart disease Mother   . Hypertension Mother   . High Cholesterol Mother   . Kidney disease Mother   . Depression Mother   . Obesity Mother   . Coronary artery disease Father   . High blood pressure Father   . High Cholesterol Father   . Depression Father   . Hypertension Brother     ROS: Review of Systems  Constitutional: Negative for weight loss.  Cardiovascular: Negative for chest pain.  Endo/Heme/Allergies:       Positive for polyphagia.  Psychiatric/Behavioral: Positive for depression.    PHYSICAL EXAM: Blood pressure 122/74, pulse 77, temperature 97.9 F (36.6 C), temperature  source Oral, height 5\' 2"  (1.575 m), weight 219 lb (99.3 kg), SpO2 96 %. Body mass index is 40.06 kg/m. Physical Exam Vitals signs reviewed.  Constitutional:      Appearance: Normal appearance. She is obese.  Cardiovascular:     Rate and Rhythm: Normal rate.  Pulmonary:     Effort: Pulmonary effort is normal.  Musculoskeletal: Normal range of motion.  Skin:    General: Skin is warm and dry.  Neurological:     Mental Status: She is alert and oriented to person, place, and time.  Psychiatric:        Mood and Affect: Mood normal.        Behavior: Behavior normal.     RECENT LABS AND TESTS: BMET    Component Value Date/Time   NA 138 12/08/2017 1306   K 4.1 12/08/2017 1306   CL 101 12/08/2017 1306   CO2 18 (L) 12/08/2017 1306   GLUCOSE 122 (H) 12/08/2017 1306   BUN 17 12/08/2017 1306   CREATININE 0.73 12/08/2017 1306   CALCIUM 9.2 12/08/2017 1306   GFRNONAA 87 12/08/2017 1306   GFRAA 101 12/08/2017 1306   No results found for: HGBA1C Lab Results  Component Value Date   INSULIN 38.2 (H) 12/08/2017   CBC    Component Value Date/Time   WBC 8.5 12/08/2017 1306   WBC 16.3 (H) 12/19/2014 1120   RBC 4.64 12/08/2017 1306   RBC 4.92 12/19/2014 1120   HGB 12.9 12/08/2017 1306   HCT 38.1 12/08/2017 1306   PLT 263 12/19/2014 1120   MCV 82 12/08/2017 1306   MCH 27.8 12/08/2017 1306   MCH 27.8 12/19/2014 1120   MCHC 33.9 12/08/2017 1306   MCHC 33.5 12/19/2014 1120   RDW 14.0 12/08/2017 1306   LYMPHSABS 2.5 12/08/2017 1306   MONOABS 1.5 (H) 12/19/2014 1120   EOSABS 0.2 12/08/2017 1306   BASOSABS 0.0 12/08/2017 1306   Iron/TIBC/Ferritin/ %Sat No results found for: IRON, TIBC, FERRITIN, IRONPCTSAT Lipid Panel     Component Value Date/Time   CHOL 197 12/08/2017 1306   TRIG 148 12/08/2017 1306   HDL 42 12/08/2017 1306   LDLCALC 125 (H) 12/08/2017 1306   Hepatic Function Panel     Component Value Date/Time   PROT 6.7 12/08/2017 1306   ALBUMIN 4.4 12/08/2017 1306    AST 90 (H) 12/08/2017 1306   ALT 118 (H) 12/08/2017  4098   JXBJYNW 29 12/08/2017 1306   BILITOT 0.5 12/08/2017 1306      Component Value Date/Time   TSH 2.550 12/08/2017 1306   Results for VERDELL, KINCANNON (MRN [id number]) as of 03/22/2018 10:09  Ref. Range 12/08/2017 13:06  Vitamin D, 25-Hydroxy Latest Ref Range: 30.0 - 100.0 ng/mL 14.4 (L)    OBESITY BEHAVIORAL INTERVENTION VISIT  Today's visit was # 8   Starting weight: 231 lbs Starting date: 12/08/17 Today's weight : Weight: 219 lb (99.3 kg)  Today's date: 03/21/2018 Total lbs lost to date: 12  ASK: We discussed the diagnosis of obesity with Isaiah Serge today and Colleen agreed to give [name] permission to discuss obesity behavioral modification therapy today.  ASSESS: Anjanette has the diagnosis of obesity and her BMI today is 40.0. Cleda is in the action stage of change.   ADVISE: Chanell was educated on the multiple health risks of obesity as well as the benefit of weight loss to improve her health. She was advised of the need for long term treatment and the importance of lifestyle modifications to improve her current health and to decrease her risk of future health problems.  AGREE: Multiple dietary modification options and treatment options were discussed and Kiylah agreed to follow the recommendations documented in the above note.  ARRANGE: Joslynne was educated on the importance of frequent visits to treat obesity as outlined per CMS and USPSTF guidelines and agreed to schedule her next follow up appointment today.  I, Marcille Blanco, am acting as Location manager for [employer], [name].  I have reviewed the above documentation for accuracy and completeness, and I agree with the above.  - Lautaro Koral, [name].

## 2018-03-28 ENCOUNTER — Encounter (INDEPENDENT_AMBULATORY_CARE_PROVIDER_SITE_OTHER): Payer: Self-pay | Admitting: Family Medicine

## 2018-03-28 MED FILL — RYBELSUS 3 MG TABS: 3 | 30 days supply | Qty: 30 | Fill #0

## 2018-03-30 MED FILL — DEXILANT DR 60 MG CAPSULE: 60 | 30 days supply | Qty: 30 | Fill #7

## 2018-04-12 ENCOUNTER — Encounter (INDEPENDENT_AMBULATORY_CARE_PROVIDER_SITE_OTHER): Payer: Self-pay | Admitting: Family Medicine

## 2018-04-12 ENCOUNTER — Ambulatory Visit (INDEPENDENT_AMBULATORY_CARE_PROVIDER_SITE_OTHER): Payer: 59 | Admitting: Family Medicine

## 2018-04-12 VITALS — BP 132/92 | HR 64 | Temp 97.4°F | Ht 62.0 in | Wt 220.0 lb

## 2018-04-12 DIAGNOSIS — E559 Vitamin D deficiency, unspecified: Secondary | ICD-10-CM | POA: Insufficient documentation

## 2018-04-12 DIAGNOSIS — E119 Type 2 diabetes mellitus without complications: Secondary | ICD-10-CM

## 2018-04-12 DIAGNOSIS — Z6841 Body Mass Index (BMI) 40.0 and over, adult: Secondary | ICD-10-CM | POA: Diagnosis not present

## 2018-04-12 DIAGNOSIS — Z9189 Other specified personal risk factors, not elsewhere classified: Secondary | ICD-10-CM | POA: Diagnosis not present

## 2018-04-12 MED ORDER — VITAMIN D (ERGOCALCIFEROL) 1.25 MG (50000 UNIT) PO CAPS: 50000.0000 [IU] | ORAL_CAPSULE | ORAL | 0 refills | Status: DC

## 2018-04-12 NOTE — Progress Notes (Signed)
Office: [phone]  /  Fax: [phone]   HPI:   Chief Complaint: OBESITY Rhonda Aguilar is here to discuss her progress with her obesity treatment plan. She is on the Category 3 plan and is following her eating plan approximately 50 to 80 % of the time. She states she is exercising 0 minutes 0 times per week. Rhonda Aguilar has recently been on vacation at the beach and indulged some. She likes the structure of the plan. She did not do well with journaling in the past. Rhonda Aguilar is quite cognizant of carb intake and working to decrease carb intake.  Her weight is 220 lb (99.8 kg) today and has had a weight gain of 1 pound over a period of 3 weeks since her last visit. She has lost 11 lbs since starting treatment with [name].  Vitamin D deficiency Rhonda Aguilar has a diagnosis of vitamin D deficiency. She is currently taking vit D. Her last level was 14.4 on 12/08/17 and is not at goal. She denies nausea, vomiting, or muscle weakness.  At risk for osteopenia and osteoporosis Rhonda Aguilar is at higher risk of osteopenia and osteoporosis due to vitamin D deficiency.   Diabetes II Rhonda Aguilar has a diagnosis of diabetes type II and is well controlled. Rhonda Aguilar states that her fasting BGs range between 104 and 126 and 3 to 4 hours after dinner, her blood sugars range between 122 and 158 (with wine). Last A1c was 6.2 in 12/19. Rhonda Aguilar had ocular migraines with metformin. She has been working on intensive lifestyle modifications including diet, exercise, and weight loss to help control her blood glucose levels.  ASSESSMENT AND PLAN:  Vitamin D deficiency - Plan: VITAMIN D 25 Hydroxy (Vit-D Deficiency, Fractures), Vitamin D, Ergocalciferol, (DRISDOL) 1.25 MG (50000 UT) CAPS capsule  Type 2 diabetes mellitus without complication, without long-term current use of insulin ([name]) - Plan: Insulin, random, Lipid Panel With LDL/HDL Ratio  At risk for osteoporosis  Class 3 severe obesity with serious comorbidity and body mass index (BMI) of 40.0 to  44.9 in adult, unspecified obesity type ([place])  PLAN:  Vitamin D Deficiency Rhonda Aguilar was informed that low vitamin D levels contributes to fatigue and are associated with obesity, breast, and colon cancer. [name] agrees to continue to take prescription Vit D @50 ,000 IU every week #4 with no refills and will follow up for routine testing of vitamin D, at least 2-3 times per year. She was informed of the risk of over-replacement of vitamin D and agrees to not increase her dose unless she discusses this with [name] first. Rhonda Aguilar agrees to follow up in 2 to 3 weeks as directed.  At risk for osteopenia and osteoporosis Rhonda Aguilar was given extended (15 minutes) osteoporosis prevention counseling today. Rhonda Aguilar is at risk for osteopenia and osteoporosis due to her vitamin D deficiency. She was encouraged to take her vitamin D and follow her higher calcium diet and increase strengthening exercise to help strengthen her bones and decrease her risk of osteopenia and osteoporosis.  Diabetes II Rhonda Aguilar has been given extensive diabetes education by myself today including ideal fasting and post-prandial blood glucose readings, individual ideal Hgb A1c goals, and hypoglycemia prevention. We discussed the importance of good blood sugar control to decrease the likelihood of diabetic complications such as nephropathy, neuropathy, limb loss, blindness, coronary artery disease, and death. We discussed the importance of intensive lifestyle modification including diet, exercise and weight loss as the first line treatment for diabetes. Rhonda Aguilar agrees to discontinue Rybelsus, which caused nausea and to  restart her metformin XR 500mg  in which she does not need a refill. Rhonda Aguilar will follow up at the agreed upon time.  Obesity Rhonda Aguilar is currently in the action stage of change. As such, her goal is to continue with weight loss efforts. She has agreed to follow the Category 3 plan. Rhonda Aguilar has been instructed to walk for 20 to 30 minutes 2 times per  week. We discussed the following Behavioral Modification Strategies today: decreasing simple carbohydrates, planning for success, and travel eating strategies.   Rhonda Aguilar has agreed to follow up with our clinic in 2 to 3 weeks. She was informed of the importance of frequent follow up visits to maximize her success with intensive lifestyle modifications for her multiple health conditions.  ALLERGIES: Allergies  Allergen Reactions  . Codeine Itching and Rash  . Penicillins Rash    MEDICATIONS: Current Outpatient Medications on File Prior to Visit  Medication Sig Dispense Refill  . ALPRAZolam (XANAX) 0.5 MG tablet Take 0.25-1 mg by mouth every 8 (eight) hours as needed for anxiety.    . busPIRone (BUSPAR) 5 MG tablet Take 5 mg by mouth 2 (two) times daily.    . cetirizine (ZYRTEC) 10 MG tablet Take 10 mg by mouth daily.    . cyclobenzaprine (FLEXERIL) 10 MG tablet Take 10 mg by mouth 3 (three) times daily as needed for muscle spasms.    [name] dexlansoprazole (DEXILANT) 60 MG capsule Take 60 mg by mouth daily.    [name] ibuprofen (ADVIL,MOTRIN) 600 MG tablet Take 600 mg by mouth every 6 (six) hours as needed for mild pain.    . metFORMIN (GLUCOPHAGE-XR) 500 MG 24 hr tablet Take 500 mg by mouth daily with breakfast.    . Peppermint Oil (IBGARD PO) Take by mouth.    . Probiotic Product (ALIGN) 4 MG CAPS Take by mouth.    . promethazine (PHENERGAN) 12.5 MG tablet Take 1 tablet (12.5 mg total) by mouth every 8 (eight) hours as needed for nausea, vomiting or refractory nausea / vomiting. 60 tablet 3  . valsartan (DIOVAN) 320 MG tablet Take 320 mg by mouth daily.     . Verapamil HCl CR 300 MG CP24 Take 300 mg by mouth at bedtime.      No current facility-administered medications on file prior to visit.     PAST MEDICAL HISTORY: Past Medical History:  Diagnosis Date  . Benign essential HTN 11/22/2014  . Diabetes mellitus, type II ([place])   . Fatty liver   . GERD (gastroesophageal reflux disease)   .  Hypertension   . IBS (irritable bowel syndrome)   . Migraine   . Obesity (BMI 30-39.9) 11/22/2014  . OSA (obstructive sleep apnea)     PAST SURGICAL HISTORY: Past Surgical History:  Procedure Laterality Date  . COLONOSCOPY    . TONSILLECTOMY      SOCIAL HISTORY: Social History   Tobacco Use  . Smoking status: Never Smoker  . Smokeless tobacco: Never Used  Substance Use Topics  . Alcohol use: Yes    Alcohol/week: 0.0 standard drinks  . Drug use: Not on file    FAMILY HISTORY: Family History  Problem Relation Age of Onset  . Diabetes Mother   . Heart disease Mother   . Hypertension Mother   . High Cholesterol Mother   . Kidney disease Mother   . Depression Mother   . Obesity Mother   . Coronary artery disease Father   . High blood pressure Father   .  High Cholesterol Father   . Depression Father   . Hypertension Brother     ROS: Review of Systems  Constitutional: Negative for weight loss.  Gastrointestinal: Negative for nausea and vomiting.  Musculoskeletal:       Negative for muscle weakness.   PHYSICAL EXAM: Blood pressure (!) 132/92, pulse 64, temperature (!) 97.4 F (36.3 C), height 5\' 2"  (1.575 m), weight 220 lb (99.8 kg), SpO2 98 %. Body mass index is 40.24 kg/m. Physical Exam Vitals signs reviewed.  Constitutional:      Appearance: Normal appearance. She is obese.  Cardiovascular:     Rate and Rhythm: Normal rate.  Pulmonary:     Effort: Pulmonary effort is normal.  Musculoskeletal: Normal range of motion.  Skin:    General: Skin is warm and dry.  Neurological:     Mental Status: She is alert and oriented to person, place, and time.  Psychiatric:        Mood and Affect: Mood normal.        Behavior: Behavior normal.     RECENT LABS AND TESTS: BMET    Component Value Date/Time   NA 138 12/08/2017 1306   K 4.1 12/08/2017 1306   CL 101 12/08/2017 1306   CO2 18 (L) 12/08/2017 1306   GLUCOSE 122 (H) 12/08/2017 1306   BUN 17 12/08/2017  1306   CREATININE 0.73 12/08/2017 1306   CALCIUM 9.2 12/08/2017 1306   GFRNONAA 87 12/08/2017 1306   GFRAA 101 12/08/2017 1306   No results found for: HGBA1C Lab Results  Component Value Date   INSULIN 38.2 (H) 12/08/2017   CBC    Component Value Date/Time   WBC 8.5 12/08/2017 1306   WBC 16.3 (H) 12/19/2014 1120   RBC 4.64 12/08/2017 1306   RBC 4.92 12/19/2014 1120   HGB 12.9 12/08/2017 1306   HCT 38.1 12/08/2017 1306   PLT 263 12/19/2014 1120   MCV 82 12/08/2017 1306   MCH 27.8 12/08/2017 1306   MCH 27.8 12/19/2014 1120   MCHC 33.9 12/08/2017 1306   MCHC 33.5 12/19/2014 1120   RDW 14.0 12/08/2017 1306   LYMPHSABS 2.5 12/08/2017 1306   MONOABS 1.5 (H) 12/19/2014 1120   EOSABS 0.2 12/08/2017 1306   BASOSABS 0.0 12/08/2017 1306   Iron/TIBC/Ferritin/ %Sat No results found for: IRON, TIBC, FERRITIN, IRONPCTSAT Lipid Panel     Component Value Date/Time   CHOL 197 12/08/2017 1306   TRIG 148 12/08/2017 1306   HDL 42 12/08/2017 1306   LDLCALC 125 (H) 12/08/2017 1306   Hepatic Function Panel     Component Value Date/Time   PROT 6.7 12/08/2017 1306   ALBUMIN 4.4 12/08/2017 1306   AST 90 (H) 12/08/2017 1306   ALT 118 (H) 12/08/2017 1306   ALKPHOS 86 12/08/2017 1306   BILITOT 0.5 12/08/2017 1306      Component Value Date/Time   TSH 2.550 12/08/2017 1306   Results for COURTNIE, BRENES (MRN [id number]) as of 04/12/2018 12:26  Ref. Range 12/08/2017 13:06  Vitamin D, 25-Hydroxy Latest Ref Range: 30.0 - 100.0 ng/mL 14.4 (L)     OBESITY BEHAVIORAL INTERVENTION VISIT  Today's visit was # 9   Starting weight: 231 lbs Starting date: 12/08/17 Today's weight : Weight: 220 lb (99.8 kg)  Today's date: 04/12/2018 Total lbs lost to date: 58  ASK: We discussed the diagnosis of obesity with Isaiah Serge today and Shaunna agreed to give [name] permission to discuss obesity behavioral modification therapy today.  ASSESS: Korissa has the diagnosis of obesity and her BMI today is  40.2. Pearle is in the action stage of change.   ADVISE: Renley was educated on the multiple health risks of obesity as well as the benefit of weight loss to improve her health. She was advised of the need for long term treatment and the importance of lifestyle modifications to improve her current health and to decrease her risk of future health problems.  AGREE: Multiple dietary modification options and treatment options were discussed and Egan agreed to follow the recommendations documented in the above note.  ARRANGE: Mykell was educated on the importance of frequent visits to treat obesity as outlined per CMS and USPSTF guidelines and agreed to schedule her next follow up appointment today.  I, Marcille Blanco, CMA, am acting as Location manager for [employer], [name].  I have reviewed the above documentation for accuracy and completeness, and I agree with the above.  -  , [name].

## 2018-04-13 LAB — LIPID PANEL WITH LDL/HDL RATIO
Cholesterol, Total: 220 mg/dL — ABNORMAL HIGH (ref 100–199)
HDL: 43 mg/dL (ref >39)
LDL Calculated: 138 mg/dL — ABNORMAL HIGH (ref 0–99)
LDl/HDL Ratio: 3.2 ratio (ref 0.0–3.2)
Triglycerides: 196 mg/dL — ABNORMAL HIGH (ref 0–149)
VLDL Cholesterol Cal: 39 mg/dL (ref 5–40)

## 2018-04-13 LAB — INSULIN, RANDOM: INSULIN: 40.9 u[IU]/mL — ABNORMAL HIGH (ref 2.6–24.9)

## 2018-04-13 LAB — VITAMIN D 25 HYDROXY (VIT D DEFICIENCY, FRACTURES): Vit D, 25-Hydroxy: 26.7 ng/mL — ABNORMAL LOW (ref 30.0–100.0)

## 2018-04-19 ENCOUNTER — Encounter (INDEPENDENT_AMBULATORY_CARE_PROVIDER_SITE_OTHER): Payer: Self-pay | Admitting: Family Medicine

## 2018-04-21 DIAGNOSIS — G4733 Obstructive sleep apnea (adult) (pediatric): Secondary | ICD-10-CM | POA: Diagnosis not present

## 2018-04-25 ENCOUNTER — Other Ambulatory Visit (INDEPENDENT_AMBULATORY_CARE_PROVIDER_SITE_OTHER): Payer: Self-pay | Admitting: Bariatrics

## 2018-04-25 ENCOUNTER — Ambulatory Visit (INDEPENDENT_AMBULATORY_CARE_PROVIDER_SITE_OTHER): Payer: 59 | Admitting: Physician Assistant

## 2018-04-25 VITALS — BP 141/68 | HR 77 | Temp 98.5°F | Ht 62.0 in | Wt 221.0 lb

## 2018-04-25 DIAGNOSIS — E119 Type 2 diabetes mellitus without complications: Secondary | ICD-10-CM

## 2018-04-25 DIAGNOSIS — E559 Vitamin D deficiency, unspecified: Secondary | ICD-10-CM

## 2018-04-25 DIAGNOSIS — Z6841 Body Mass Index (BMI) 40.0 and over, adult: Secondary | ICD-10-CM

## 2018-04-25 DIAGNOSIS — Z9189 Other specified personal risk factors, not elsewhere classified: Secondary | ICD-10-CM

## 2018-04-25 MED ORDER — VITAMIN D (ERGOCALCIFEROL) 1.25 MG (50000 UNIT) PO CAPS: 50000.0000 [IU] | ORAL_CAPSULE | ORAL | 0 refills | Status: DC

## 2018-04-25 MED FILL — DEXILANT DR 60 MG CAPSULE: 60 | 30 days supply | Qty: 30 | Fill #8

## 2018-04-25 MED FILL — busPIRone HCL 5 MG TABS: 5 | 90 days supply | Qty: 90 | Fill #2

## 2018-04-25 NOTE — Progress Notes (Signed)
Office: [phone]  /  Fax: [phone]   HPI:   Chief Complaint: OBESITY Rhonda Aguilar is here to discuss her progress with her obesity treatment plan. She is on the Category 3 plan and is following her eating plan approximately 80% of the time. She states she is walking 5,000 steps 4 times per week. Rhonda Aguilar reports that she has not been eating enough protein, especially at dinner. She is going on vacation in 2 weeks. Her weight is 221 lb (100.2 kg) today and has had a weight gain of 1 lb since her last visit. She has lost 10 lbs since starting treatment with [name].  Vitamin D deficiency Rhonda Aguilar has a diagnosis of Vitamin D deficiency. She is currently taking prescription Vit D and denies nausea, vomiting or muscle weakness.  At risk for osteopenia and osteoporosis Rhonda Aguilar is at higher risk of osteopenia and osteoporosis due to Vitamin D deficiency.   Diabetes II Rhonda Aguilar has a diagnosis of diabetes type II. She is on metformin and reports headache and stomach upset and wants to discontinue it. Rhonda Aguilar states fasting blood sugars range between 109 and 120. She has been working on intensive lifestyle modifications including diet, exercise, and weight loss to help control her blood glucose levels.  ASSESSMENT AND PLAN:  Vitamin D deficiency - Plan: Vitamin D, Ergocalciferol, (DRISDOL) 1.25 MG (50000 UT) CAPS capsule  Type 2 diabetes mellitus without complication, without long-term current use of insulin ([name])  At risk for osteoporosis  Class 3 severe obesity with serious comorbidity and body mass index (BMI) of 40.0 to 44.9 in adult, unspecified obesity type ([place])  PLAN:  Vitamin D Deficiency Rhonda Aguilar was informed that low Vitamin D levels contributes to fatigue and are associated with obesity, breast, and colon cancer. She agrees to continue to take prescription Vit D @ 50,000 IU every week #4 with 0 refills and will follow-up for routine testing of Vitamin D, at least 2-3 times per year. She was  informed of the risk of over-replacement of Vitamin D and agrees to not increase her dose unless she discusses this with [name] first. Rhonda Aguilar agrees to follow-up with our clinic in 3 weeks.  At risk for osteopenia and osteoporosis Rhonda Aguilar was given extended  (15 minutes) osteoporosis prevention counseling today. Rhonda Aguilar is at risk for osteopenia and osteoporsis due to her Vitamin D deficiency. She was encouraged to take her Vitamin D and follow her higher calcium diet and increase strengthening exercise to help strengthen her bones and decrease her risk of osteopenia and osteoporosis.  Diabetes II Rhonda Aguilar has been given extensive diabetes education by myself today including ideal fasting and post-prandial blood glucose readings, individual ideal HgA1c goals  and hypoglycemia prevention. We discussed the importance of good blood sugar control to decrease the likelihood of diabetic complications such as nephropathy, neuropathy, limb loss, blindness, coronary artery disease, and death. We discussed the importance of intensive lifestyle modification including diet, exercise and weight loss as the first line treatment for diabetes. Rhonda Aguilar will discontinue metformin and will follow-up at the agreed upon time.  Obesity Rhonda Aguilar is currently in the action stage of change. As such, her goal is to continue with weight loss efforts. She has agreed to follow the Category 3 plan. Rhonda Aguilar has been instructed to work up to a goal of 150 minutes of combined cardio and strengthening exercise per week for weight loss and overall health benefits. We discussed the following Behavioral Modification Strategies today: work on meal planning and easy cooking plans and  travel eating strategies.  Rhonda Aguilar has agreed to follow-up with our clinic in 3 weeks. She was informed of the importance of frequent follow up visits to maximize her success with intensive lifestyle modifications for her multiple health conditions.  ALLERGIES: Allergies    Allergen Reactions  . Codeine Itching and Rash  . Penicillins Rash    MEDICATIONS: Current Outpatient Medications on File Prior to Visit  Medication Sig Dispense Refill  . ALPRAZolam (XANAX) 0.5 MG tablet Take 0.25-1 mg by mouth every 8 (eight) hours as needed for anxiety.    . busPIRone (BUSPAR) 5 MG tablet Take 5 mg by mouth 2 (two) times daily.    . cetirizine (ZYRTEC) 10 MG tablet Take 10 mg by mouth daily.    . cyclobenzaprine (FLEXERIL) 10 MG tablet Take 10 mg by mouth 3 (three) times daily as needed for muscle spasms.    [name] dexlansoprazole (DEXILANT) 60 MG capsule Take 60 mg by mouth daily.    [name] ibuprofen (ADVIL,MOTRIN) 600 MG tablet Take 600 mg by mouth every 6 (six) hours as needed for mild pain.    [name] Peppermint Oil (IBGARD PO) Take by mouth.    . Probiotic Product (ALIGN) 4 MG CAPS Take by mouth.    . promethazine (PHENERGAN) 12.5 MG tablet Take 1 tablet (12.5 mg total) by mouth every 8 (eight) hours as needed for nausea, vomiting or refractory nausea / vomiting. 60 tablet 3  . valsartan (DIOVAN) 320 MG tablet Take 320 mg by mouth daily.     . Verapamil HCl CR 300 MG CP24 Take 300 mg by mouth at bedtime.      No current facility-administered medications on file prior to visit.     PAST MEDICAL HISTORY: Past Medical History:  Diagnosis Date  . Benign essential HTN 11/22/2014  . Diabetes mellitus, type II ([place])   . Fatty liver   . GERD (gastroesophageal reflux disease)   . Hypertension   . IBS (irritable bowel syndrome)   . Migraine   . Obesity (BMI 30-39.9) 11/22/2014  . OSA (obstructive sleep apnea)     PAST SURGICAL HISTORY: Past Surgical History:  Procedure Laterality Date  . COLONOSCOPY    . TONSILLECTOMY      SOCIAL HISTORY: Social History   Tobacco Use  . Smoking status: Never Smoker  . Smokeless tobacco: Never Used  Substance Use Topics  . Alcohol use: Yes    Alcohol/week: 0.0 standard drinks  . Drug use: Not on file    FAMILY HISTORY: Family  History  Problem Relation Age of Onset  . Diabetes Mother   . Heart disease Mother   . Hypertension Mother   . High Cholesterol Mother   . Kidney disease Mother   . Depression Mother   . Obesity Mother   . Coronary artery disease Father   . High blood pressure Father   . High Cholesterol Father   . Depression Father   . Hypertension Brother    ROS: Review of Systems  Constitutional: Negative for weight loss.  Gastrointestinal: Negative for nausea and vomiting.       Positive for stomach upset on metformin.  Musculoskeletal:       Negative for muscle weakness.  Neurological: Positive for headaches (on metformin).   PHYSICAL EXAM: Blood pressure (!) 141/68, pulse 77, temperature 98.5 F (36.9 C), temperature source Oral, height 5\' 2"  (1.575 m), weight 221 lb (100.2 kg), SpO2 97 %. Body mass index is 40.42 kg/m. Physical Exam Vitals signs  reviewed.  Constitutional:      Appearance: Normal appearance. She is obese.  Cardiovascular:     Rate and Rhythm: Normal rate.     Pulses: Normal pulses.  Pulmonary:     Effort: Pulmonary effort is normal.     Breath sounds: Normal breath sounds.  Musculoskeletal: Normal range of motion.  Skin:    General: Skin is warm and dry.  Neurological:     Mental Status: She is alert and oriented to person, place, and time.  Psychiatric:        Behavior: Behavior normal.   RECENT LABS AND TESTS: BMET    Component Value Date/Time   NA 138 12/08/2017 1306   K 4.1 12/08/2017 1306   CL 101 12/08/2017 1306   CO2 18 (L) 12/08/2017 1306   GLUCOSE 122 (H) 12/08/2017 1306   BUN 17 12/08/2017 1306   CREATININE 0.73 12/08/2017 1306   CALCIUM 9.2 12/08/2017 1306   GFRNONAA 87 12/08/2017 1306   GFRAA 101 12/08/2017 1306   No results found for: HGBA1C Lab Results  Component Value Date   INSULIN 40.9 (H) 04/12/2018   INSULIN 38.2 (H) 12/08/2017   CBC    Component Value Date/Time   WBC 8.5 12/08/2017 1306   WBC 16.3 (H) 12/19/2014 1120    RBC 4.64 12/08/2017 1306   RBC 4.92 12/19/2014 1120   HGB 12.9 12/08/2017 1306   HCT 38.1 12/08/2017 1306   PLT 263 12/19/2014 1120   MCV 82 12/08/2017 1306   MCH 27.8 12/08/2017 1306   MCH 27.8 12/19/2014 1120   MCHC 33.9 12/08/2017 1306   MCHC 33.5 12/19/2014 1120   RDW 14.0 12/08/2017 1306   LYMPHSABS 2.5 12/08/2017 1306   MONOABS 1.5 (H) 12/19/2014 1120   EOSABS 0.2 12/08/2017 1306   BASOSABS 0.0 12/08/2017 1306   Iron/TIBC/Ferritin/ %Sat No results found for: IRON, TIBC, FERRITIN, IRONPCTSAT Lipid Panel     Component Value Date/Time   CHOL 220 (H) 04/12/2018 1036   TRIG 196 (H) 04/12/2018 1036   HDL 43 04/12/2018 1036   LDLCALC 138 (H) 04/12/2018 1036   Hepatic Function Panel     Component Value Date/Time   PROT 6.7 12/08/2017 1306   ALBUMIN 4.4 12/08/2017 1306   AST 90 (H) 12/08/2017 1306   ALT 118 (H) 12/08/2017 1306   ALKPHOS 86 12/08/2017 1306   BILITOT 0.5 12/08/2017 1306      Component Value Date/Time   TSH 2.550 12/08/2017 1306    Ref. Range 04/12/2018 10:36  Vitamin D, 25-Hydroxy Latest Ref Range: 30.0 - 100.0 ng/mL 26.7 (L)   OBESITY BEHAVIORAL INTERVENTION VISIT  Today's visit was #10   Starting weight: 231 lbs Starting date: 12/08/2017 Today's weight: 221 lbs Today's date: 04/25/2018 Total lbs lost to date: 10    04/25/2018  Height 5\' 2"  (1.575 m)  Weight 221 lb (100.2 kg)  BMI (Calculated) 40.41  BLOOD PRESSURE - SYSTOLIC 841  BLOOD PRESSURE - DIASTOLIC 68   Body Fat % 32.4 %  Total Body Water (lbs) 82 lbs   ASK: We discussed the diagnosis of obesity with Rhonda Aguilar today and Katye agreed to give [name] permission to discuss obesity behavioral modification therapy today.  ASSESS: Rhonda Aguilar has the diagnosis of obesity and her BMI today is 40.41. Rhonda Aguilar is in the action stage of change.   ADVISE: Rhonda Aguilar was educated on the multiple health risks of obesity as well as the benefit of weight loss to improve her health. She  was advised of the need  for long term treatment and the importance of lifestyle modifications to improve her current health and to decrease her risk of future health problems.  AGREE: Multiple dietary modification options and treatment options were discussed and  Rhonda Aguilar agreed to follow the recommendations documented in the above note.  ARRANGE: Rhonda Aguilar was educated on the importance of frequent visits to treat obesity as outlined per CMS and USPSTF guidelines and agreed to schedule her next follow up appointment today.  Migdalia Dk, am acting as transcriptionist for [name], PA-C I, [name], PA-C have reviewed above note and agree with its content

## 2018-04-26 ENCOUNTER — Encounter (INDEPENDENT_AMBULATORY_CARE_PROVIDER_SITE_OTHER): Payer: Self-pay | Admitting: Physician Assistant

## 2018-04-26 ENCOUNTER — Other Ambulatory Visit (INDEPENDENT_AMBULATORY_CARE_PROVIDER_SITE_OTHER): Payer: Self-pay | Admitting: Bariatrics

## 2018-04-26 DIAGNOSIS — E559 Vitamin D deficiency, unspecified: Secondary | ICD-10-CM

## 2018-04-26 MED FILL — VIT D2 1.25 MG (50,000 UNIT: 1.25 MG | 28 days supply | Qty: 4 | Fill #0

## 2018-05-05 ENCOUNTER — Other Ambulatory Visit: Payer: Self-pay

## 2018-05-05 ENCOUNTER — Encounter: Payer: Self-pay | Admitting: Adult Health

## 2018-05-05 ENCOUNTER — Ambulatory Visit (INDEPENDENT_AMBULATORY_CARE_PROVIDER_SITE_OTHER): Payer: 59 | Admitting: Adult Health

## 2018-05-05 VITALS — BP 129/83 | HR 74 | Ht 62.0 in | Wt 225.0 lb

## 2018-05-05 DIAGNOSIS — G4733 Obstructive sleep apnea (adult) (pediatric): Secondary | ICD-10-CM

## 2018-05-05 DIAGNOSIS — Z9989 Dependence on other enabling machines and devices: Secondary | ICD-10-CM

## 2018-05-05 DIAGNOSIS — E1165 Type 2 diabetes mellitus with hyperglycemia: Secondary | ICD-10-CM | POA: Diagnosis not present

## 2018-05-05 NOTE — Progress Notes (Signed)
PATIENT: Rhonda Aguilar DOB: 1955-05-30  REASON FOR VISIT: follow up HISTORY FROM: patient  HISTORY OF PRESENT ILLNESS: HISTORY    Rhonda Aguilar is a 63 y.o. female with underlying medical history of HTN, DM, migraines, obesity and OSA who was recently evaluated in this office by Dr. Brett Fairy for OSA evaluation on 01/17/2018.  She had been previously diagnosed with OSA in 2012 by this office and continued to use CPAP for management but was referred back to this office as she was lost to follow-up to ensure settings were still appropriate and OSA adequately managed.  She also had complaints of ocular aura migraines with hemiparetic component with history of migraines (no prior auras) but previously controlled until recently initiating oral metformin for newly diagnosed diabetes.  It was recommended for her to obtain home sleep study without use of CPAP to ensure adequate settings which was obtained on 02/16/2018 and showed AHI 24.9 and REM AHI 52.3 therefore recommended auto titration device with 6 to 16 cm water pressure with a EPR of 2.  Compliance report obtained from 04/02/2018 -05/01/2018 showing 30 out of 30 usage days and 30 days greater than 4 hours 400% compliance.  Average usage 7 hours and 15 minutes with residual AHI 0.2.  Leaks in the 95th percentile 11.7 L/min and pressure in the 95th percentile 11.3 cm H2O with pressure settings 6 cm H2O to 16 cm H2O with EPR level 1.  She does not have any complaints with current CPAP machine or nasal pillows.  [facility].  She denies any excessive daytime fatigue or insomnia.  Migraines have resolved since discontinuation of diabetic medication.  She continues to have A1c monitored by PCP with most recent level 6.2 not on any diabetic medication.  No further concerns at this time.  She returns today for reevaluation.   REVIEW OF SYSTEMS: Out of a complete 14 system review of  symptoms, the patient complains only of the following symptoms, and all other reviewed systems are negative. Joint pain and snoring  ALLERGIES: Allergies  Allergen Reactions  . Codeine Itching and Rash  . Penicillins Rash    HOME MEDICATIONS: Outpatient Medications Prior to Visit  Medication Sig Dispense Refill  . ALPRAZolam (XANAX) 0.5 MG tablet Take 0.25-1 mg by mouth every 8 (eight) hours as needed for anxiety.    . busPIRone (BUSPAR) 5 MG tablet Take 5 mg by mouth 2 (two) times daily.    . cetirizine (ZYRTEC) 10 MG tablet Take 10 mg by mouth daily.    . cyclobenzaprine (FLEXERIL) 10 MG tablet Take 10 mg by mouth 3 (three) times daily as needed for muscle spasms.    [name] dexlansoprazole (DEXILANT) 60 MG capsule Take 60 mg by mouth daily.    [name] ibuprofen (ADVIL,MOTRIN) 600 MG tablet Take 600 mg by mouth every 6 (six) hours as needed for mild pain.    [name] Peppermint Oil (IBGARD PO) Take by mouth.    . Probiotic Product (ALIGN) 4 MG CAPS Take by mouth.    . promethazine (PHENERGAN) 12.5 MG tablet Take 1 tablet (12.5 mg total) by mouth every 8 (eight) hours as needed for nausea, vomiting or refractory nausea / vomiting. 60 tablet 3  . valsartan (DIOVAN) 320 MG tablet Take 320 mg by mouth daily.     . Verapamil HCl CR 300 MG CP24 Take 300 mg by mouth at bedtime.     [name]  Vitamin D, Ergocalciferol, (DRISDOL) 1.25 MG (50000 UT) CAPS capsule TAKE 1 CAPSULE BY MOUTH EVERY 7 DAYS 4 capsule 0   No facility-administered medications prior to visit.     PAST MEDICAL HISTORY: Past Medical History:  Diagnosis Date  . Benign essential HTN 11/22/2014  . Diabetes mellitus, type II ([place])   . Fatty liver   . GERD (gastroesophageal reflux disease)   . Hypertension   . IBS (irritable bowel syndrome)   . Migraine   . Obesity (BMI 30-39.9) 11/22/2014  . OSA (obstructive sleep apnea)     PAST SURGICAL HISTORY: Past Surgical History:  Procedure Laterality Date  . COLONOSCOPY    . TONSILLECTOMY       FAMILY HISTORY: Family History  Problem Relation Age of Onset  . Diabetes Mother   . Heart disease Mother   . Hypertension Mother   . High Cholesterol Mother   . Kidney disease Mother   . Depression Mother   . Obesity Mother   . Coronary artery disease Father   . High blood pressure Father   . High Cholesterol Father   . Depression Father   . Hypertension Brother     SOCIAL HISTORY: Social History   Socioeconomic History  . Marital status: Divorced    Spouse name: Not on file  . Number of children: 0  . Years of education: college  . Highest education level: Not on file  Occupational History  . Occupation: Engineer, mining cardiology  Social Needs  . Financial resource strain: Not on file  . Food insecurity:    Worry: Not on file    Inability: Not on file  . Transportation needs:    Medical: Not on file    Non-medical: Not on file  Tobacco Use  . Smoking status: Never Smoker  . Smokeless tobacco: Never Used  Substance and Sexual Activity  . Alcohol use: Yes    Alcohol/week: 0.0 standard drinks  . Drug use: Not on file  . Sexual activity: Not on file  Lifestyle  . Physical activity:    Days per week: Not on file    Minutes per session: Not on file  . Stress: Not on file  Relationships  . Social connections:    Talks on phone: Not on file    Gets together: Not on file    Attends religious service: Not on file    Active member of club or organization: Not on file    Attends meetings of clubs or organizations: Not on file    Relationship status: Not on file  . Intimate partner violence:    Fear of current or ex partner: Not on file    Emotionally abused: Not on file    Physically abused: Not on file    Forced sexual activity: Not on file  Other Topics Concern  . Not on file  Social History Narrative  . Not on file      PHYSICAL EXAM  Vitals:   05/05/18 1050  BP: 129/83  Pulse: 74  Weight: 225 lb (102.1 kg)  Height: 5\' 2"  (1.575 m)   Body  mass index is 41.15 kg/m.  Generalized: Pleasant obese middle-aged Caucasian female, in no acute distress   Neurological examination  Mentation: Alert oriented to time, place, history taking. Follows all commands speech and language fluent Cranial nerve II-XII: Pupils were equal round reactive to light. Extraocular movements were full, visual field were full on confrontational test. Facial sensation and strength were normal. Uvula  tongue midline. Head turning and shoulder shrug  were normal and symmetric. Motor: The motor testing reveals 5 over 5 strength of all 4 extremities. Good symmetric motor tone is noted throughout.  Sensory: Sensory testing is intact to soft touch on all 4 extremities. No evidence of extinction is noted.  Coordination: Cerebellar testing reveals good finger-nose-finger and heel-to-shin bilaterally.  Gait and station: Gait is normal. Tandem gait is normal. Romberg is negative. No drift is seen.  Reflexes: Deep tendon reflexes are symmetric and normal bilaterally.   DIAGNOSTIC DATA (LABS, IMAGING, TESTING) - I reviewed patient records, labs, notes, testing and imaging myself where available.  Lab Results  Component Value Date   WBC 8.5 12/08/2017   HGB 12.9 12/08/2017   HCT 38.1 12/08/2017   MCV 82 12/08/2017   PLT 263 12/19/2014      Component Value Date/Time   NA 138 12/08/2017 1306   K 4.1 12/08/2017 1306   CL 101 12/08/2017 1306   CO2 18 (L) 12/08/2017 1306   GLUCOSE 122 (H) 12/08/2017 1306   BUN 17 12/08/2017 1306   CREATININE 0.73 12/08/2017 1306   CALCIUM 9.2 12/08/2017 1306   PROT 6.7 12/08/2017 1306   ALBUMIN 4.4 12/08/2017 1306   AST 90 (H) 12/08/2017 1306   ALT 118 (H) 12/08/2017 1306   ALKPHOS 86 12/08/2017 1306   BILITOT 0.5 12/08/2017 1306   GFRNONAA 87 12/08/2017 1306   GFRAA 101 12/08/2017 1306   Lab Results  Component Value Date   CHOL 220 (H) 04/12/2018   HDL 43 04/12/2018   LDLCALC 138 (H) 04/12/2018   TRIG 196 (H) 04/12/2018    No results found for: HGBA1C No results found for: VITAMINB12 Lab Results  Component Value Date   TSH 2.550 12/08/2017      ASSESSMENT AND PLAN 63 y.o. year old female  has a past medical history of Benign essential HTN (11/22/2014), Diabetes mellitus, type II ([place]), Fatty liver, GERD (gastroesophageal reflux disease), Hypertension, IBS (irritable bowel syndrome), Migraine, Obesity (BMI 30-39.9) (11/22/2014), and OSA (obstructive sleep apnea). here with CPAP compliance visit for OSA management  She demonstrates excellent compliance with CPAP machine with residual AHI 0.2 therefore no indication at this time to change any settings.  She does not need any appointment at this time.  Her DME company is [facility].  Her migraines have resolved since discontinuation of diabetic medication.  She will return in 1 years time with Jinny Blossom, NP or call earlier if needed  Greater than 50% of this 15-minute visit was spent discussing CPAP download report and importance of ongoing compliance   Venancio Poisson, MSN, AGNP-BC 05/05/2018, 10:59 AM [facility] [address], [place], [place] [address] [phone]

## 2018-05-05 NOTE — Patient Instructions (Signed)
Your Plan:  Continue current settings of CPAP machine along with excellent compliance for OSA management  Follow-up in 1 year with Jinny Blossom, NP or call earlier if needed     Thank you for coming to see [name] at [facility]. I hope we have been able to provide you high quality care today.  You may receive a patient satisfaction survey over the next few weeks. We would appreciate your feedback and comments so that we may continue to improve ourselves and the health of our patients.

## 2018-05-11 ENCOUNTER — Other Ambulatory Visit (INDEPENDENT_AMBULATORY_CARE_PROVIDER_SITE_OTHER): Payer: Self-pay | Admitting: Family Medicine

## 2018-05-11 DIAGNOSIS — E559 Vitamin D deficiency, unspecified: Secondary | ICD-10-CM

## 2018-05-11 MED FILL — VALSARTAN 320 MG TAB: 320 | 90 days supply | Qty: 90 | Fill #1

## 2018-05-12 MED FILL — VERAPAMIL ER PM 300 MG CAP: 300 | 90 days supply | Qty: 90 | Fill #0

## 2018-05-15 ENCOUNTER — Ambulatory Visit (INDEPENDENT_AMBULATORY_CARE_PROVIDER_SITE_OTHER): Payer: Self-pay | Admitting: Physician Assistant

## 2018-05-18 ENCOUNTER — Encounter (INDEPENDENT_AMBULATORY_CARE_PROVIDER_SITE_OTHER): Payer: Self-pay

## 2018-05-18 ENCOUNTER — Encounter (INDEPENDENT_AMBULATORY_CARE_PROVIDER_SITE_OTHER): Payer: Self-pay | Admitting: Physician Assistant

## 2018-05-20 DIAGNOSIS — G4733 Obstructive sleep apnea (adult) (pediatric): Secondary | ICD-10-CM | POA: Diagnosis not present

## 2018-05-21 ENCOUNTER — Encounter (INDEPENDENT_AMBULATORY_CARE_PROVIDER_SITE_OTHER): Payer: Self-pay | Admitting: Physician Assistant

## 2018-05-22 ENCOUNTER — Other Ambulatory Visit (INDEPENDENT_AMBULATORY_CARE_PROVIDER_SITE_OTHER): Payer: Self-pay

## 2018-05-22 DIAGNOSIS — E559 Vitamin D deficiency, unspecified: Secondary | ICD-10-CM

## 2018-05-22 MED ORDER — VITAMIN D (ERGOCALCIFEROL) 1.25 MG (50000 UNIT) PO CAPS: ORAL_CAPSULE | ORAL | 0 refills | Status: DC

## 2018-05-22 MED FILL — FREESTYLE LITE TEST STRIP: 90 days supply | Qty: 200 | Fill #1

## 2018-05-22 MED FILL — VIT D2 1.25 MG (50,000 UNIT: 1.25 MG | 28 days supply | Qty: 4 | Fill #0

## 2018-05-22 MED FILL — FREESTYLE LANCETS: 100 days supply | Qty: 200 | Fill #0

## 2018-05-25 ENCOUNTER — Encounter (INDEPENDENT_AMBULATORY_CARE_PROVIDER_SITE_OTHER): Payer: Self-pay | Admitting: Physician Assistant

## 2018-05-25 ENCOUNTER — Other Ambulatory Visit: Payer: Self-pay

## 2018-05-25 ENCOUNTER — Ambulatory Visit (INDEPENDENT_AMBULATORY_CARE_PROVIDER_SITE_OTHER): Payer: 59 | Admitting: Physician Assistant

## 2018-05-25 DIAGNOSIS — Z6841 Body Mass Index (BMI) 40.0 and over, adult: Secondary | ICD-10-CM

## 2018-05-25 DIAGNOSIS — E559 Vitamin D deficiency, unspecified: Secondary | ICD-10-CM

## 2018-05-25 NOTE — Progress Notes (Signed)
Office: [phone]  /  Fax: [phone] TeleHealth Visit:  Rhonda Aguilar has verbally consented to this TeleHealth visit today. The patient is located at home, the provider is located at the [facility] office. The participants in this visit include the listed provider and patient. The visit was conducted today via FaceTime.  HPI:   Chief Complaint: OBESITY [name] is here to discuss her progress with her obesity treatment plan. She is on the Category 3 plan and is following her eating plan approximately 60% of the time. She states she is exercising 0 minutes 0 times per week. [name] reports that she has been stress eating due to recent developments. She is getting things that tempt her out of her house. [name] reports her blood sugar level average to be 130. We were unable to weigh the patient today for this TeleHealth visit. She feels as if she has gained weight since her last visit. She has lost 10 lbs since starting treatment with [name].  Vitamin D deficiency [name] has a diagnosis of Vitamin D deficiency. Her last Vitamin D level was not at goal. She is currently taking Vit D and denies nausea, vomiting, or muscle weakness.  ASSESSMENT AND PLAN:  Vitamin D deficiency  Class 3 severe obesity with serious comorbidity and body mass index (BMI) of 40.0 to 44.9 in adult, unspecified obesity type ([place])  PLAN:  Vitamin D Deficiency [name] was informed that low Vitamin D levels contributes to fatigue and are associated with obesity, breast, and colon cancer. She agrees to continue to take prescription Vit D @ 50,000 IU every week and will follow-up for routine testing of Vitamin D, at least 2-3 times per year. She was informed of the risk of over-replacement of Vitamin D and agrees to not increase her dose unless she discusses this with [name] first. [name] agrees to follow-up with our clinic in 2 weeks.  I spent > than 50% of the 25 minute visit on counseling as documented in the  note.  Obesity [name] is currently in the action stage of change. As such, her goal is to continue with weight loss efforts. She has agreed to follow the Category 3 plan. [name] has been instructed to work up to a goal of 150 minutes of combined cardio and strengthening exercise per week for weight loss and overall health benefits. We discussed the following Behavioral Modification Strategies today: work on meal planning, easy cooking plans, and keeping healthy foods in the home.  [name] has agreed to follow-up with our clinic in 2 weeks. She was informed of the importance of frequent follow-up visits to maximize her success with intensive lifestyle modifications for her multiple health conditions.  ALLERGIES: Allergies  Allergen Reactions  . Codeine Itching and Rash  . Penicillins Rash    MEDICATIONS: Current Outpatient Medications on File Prior to Visit  Medication Sig Dispense Refill  . ALPRAZolam (XANAX) 0.5 MG tablet Take 0.25-1 mg by mouth every 8 (eight) hours as needed for anxiety.    . busPIRone (BUSPAR) 5 MG tablet Take 5 mg by mouth 2 (two) times daily.    . cetirizine (ZYRTEC) 10 MG tablet Take 10 mg by mouth daily.    . cyclobenzaprine (FLEXERIL) 10 MG tablet Take 10 mg by mouth 3 (three) times daily as needed for muscle spasms.    [name] dexlansoprazole (DEXILANT) 60 MG capsule Take 60 mg by mouth daily.    [name] ibuprofen (ADVIL,MOTRIN) 600 MG tablet Take 600 mg by mouth  every 6 (six) hours as needed for mild pain.    [name] Peppermint Oil (IBGARD PO) Take by mouth.    . Probiotic Product (ALIGN) 4 MG CAPS Take by mouth.    . promethazine (PHENERGAN) 12.5 MG tablet Take 1 tablet (12.5 mg total) by mouth every 8 (eight) hours as needed for nausea, vomiting or refractory nausea / vomiting. 60 tablet 3  . valsartan (DIOVAN) 320 MG tablet Take 320 mg by mouth daily.     . Verapamil HCl CR 300 MG CP24 Take 300 mg by mouth at bedtime.     . Vitamin D, Ergocalciferol, (DRISDOL) 1.25 MG (50000  UT) CAPS capsule TAKE 1 CAPSULE BY MOUTH EVERY 7 DAYS 4 capsule 0   No current facility-administered medications on file prior to visit.     PAST MEDICAL HISTORY: Past Medical History:  Diagnosis Date  . Benign essential HTN 11/22/2014  . Diabetes mellitus, type II ([place])   . Fatty liver   . GERD (gastroesophageal reflux disease)   . Hypertension   . IBS (irritable bowel syndrome)   . Migraine   . Obesity (BMI 30-39.9) 11/22/2014  . OSA (obstructive sleep apnea)     PAST SURGICAL HISTORY: Past Surgical History:  Procedure Laterality Date  . COLONOSCOPY    . TONSILLECTOMY      SOCIAL HISTORY: Social History   Tobacco Use  . Smoking status: Never Smoker  . Smokeless tobacco: Never Used  Substance Use Topics  . Alcohol use: Yes    Alcohol/week: 0.0 standard drinks  . Drug use: Not on file    FAMILY HISTORY: Family History  Problem Relation Age of Onset  . Diabetes Mother   . Heart disease Mother   . Hypertension Mother   . High Cholesterol Mother   . Kidney disease Mother   . Depression Mother   . Obesity Mother   . Coronary artery disease Father   . High blood pressure Father   . High Cholesterol Father   . Depression Father   . Hypertension Brother    ROS: Review of Systems  Gastrointestinal: Negative for nausea and vomiting.  Musculoskeletal:       Negative for muscle weakness.   PHYSICAL EXAM: Pt in no acute distress  RECENT LABS AND TESTS: BMET    Component Value Date/Time   NA 138 12/08/2017 1306   K 4.1 12/08/2017 1306   CL 101 12/08/2017 1306   CO2 18 (L) 12/08/2017 1306   GLUCOSE 122 (H) 12/08/2017 1306   BUN 17 12/08/2017 1306   CREATININE 0.73 12/08/2017 1306   CALCIUM 9.2 12/08/2017 1306   GFRNONAA 87 12/08/2017 1306   GFRAA 101 12/08/2017 1306   No results found for: HGBA1C Lab Results  Component Value Date   INSULIN 40.9 (H) 04/12/2018   INSULIN 38.2 (H) 12/08/2017   CBC    Component Value Date/Time   WBC 8.5 12/08/2017  1306   WBC 16.3 (H) 12/19/2014 1120   RBC 4.64 12/08/2017 1306   RBC 4.92 12/19/2014 1120   HGB 12.9 12/08/2017 1306   HCT 38.1 12/08/2017 1306   PLT 263 12/19/2014 1120   MCV 82 12/08/2017 1306   MCH 27.8 12/08/2017 1306   MCH 27.8 12/19/2014 1120   MCHC 33.9 12/08/2017 1306   MCHC 33.5 12/19/2014 1120   RDW 14.0 12/08/2017 1306   LYMPHSABS 2.5 12/08/2017 1306   MONOABS 1.5 (H) 12/19/2014 1120   EOSABS 0.2 12/08/2017 1306   BASOSABS 0.0 12/08/2017 1306  Iron/TIBC/Ferritin/ %Sat No results found for: IRON, TIBC, FERRITIN, IRONPCTSAT Lipid Panel     Component Value Date/Time   CHOL 220 (H) 04/12/2018 1036   TRIG 196 (H) 04/12/2018 1036   HDL 43 04/12/2018 1036   LDLCALC 138 (H) 04/12/2018 1036   Hepatic Function Panel     Component Value Date/Time   PROT 6.7 12/08/2017 1306   ALBUMIN 4.4 12/08/2017 1306   AST 90 (H) 12/08/2017 1306   ALT 118 (H) 12/08/2017 1306   ALKPHOS 86 12/08/2017 1306   BILITOT 0.5 12/08/2017 1306      Component Value Date/Time   TSH 2.550 12/08/2017 1306   Results for SUPRINA, MANDEVILLE (MRN [id number]) as of 05/25/2018 15:52  Ref. Range 04/12/2018 10:36  Vitamin D, 25-Hydroxy Latest Ref Range: 30.0 - 100.0 ng/mL 26.7 (L)    I, Michaelene Song, am acting as Location manager for [employer], PA-C I, [name], PA-C have reviewed above note and agree with its content

## 2018-05-26 MED FILL — DEXILANT DR 60 MG CAPSULE: 60 | 30 days supply | Qty: 30 | Fill #9

## 2018-05-29 ENCOUNTER — Encounter (INDEPENDENT_AMBULATORY_CARE_PROVIDER_SITE_OTHER): Payer: Self-pay

## 2018-05-29 ENCOUNTER — Ambulatory Visit (INDEPENDENT_AMBULATORY_CARE_PROVIDER_SITE_OTHER): Payer: Self-pay | Admitting: Physician Assistant

## 2018-06-07 ENCOUNTER — Other Ambulatory Visit: Payer: Self-pay

## 2018-06-07 ENCOUNTER — Encounter (INDEPENDENT_AMBULATORY_CARE_PROVIDER_SITE_OTHER): Payer: Self-pay | Admitting: Physician Assistant

## 2018-06-07 ENCOUNTER — Ambulatory Visit (INDEPENDENT_AMBULATORY_CARE_PROVIDER_SITE_OTHER): Payer: 59 | Admitting: Physician Assistant

## 2018-06-07 DIAGNOSIS — Z6841 Body Mass Index (BMI) 40.0 and over, adult: Secondary | ICD-10-CM

## 2018-06-07 DIAGNOSIS — E119 Type 2 diabetes mellitus without complications: Secondary | ICD-10-CM

## 2018-06-07 DIAGNOSIS — E559 Vitamin D deficiency, unspecified: Secondary | ICD-10-CM | POA: Diagnosis not present

## 2018-06-07 MED ORDER — VITAMIN D (ERGOCALCIFEROL) 1.25 MG (50000 UNIT) PO CAPS: ORAL_CAPSULE | ORAL | 0 refills | Status: DC

## 2018-06-07 NOTE — Progress Notes (Signed)
Office: [phone]  /  Fax: [phone] TeleHealth Visit:  [name] has verbally consented to this TeleHealth visit today. The patient is located at home, the provider is located at the [facility] office. The participants in this visit include the listed provider and patient. The visit was conducted today via Webex.  HPI:   Chief Complaint: OBESITY Rhonda Aguilar is here to discuss her progress with her obesity treatment plan. She is on the Category 3 plan and is following her eating plan approximately 75% of the time. She states she is exercising 0 minutes 0 times per week. Rhonda Aguilar reports that she has gotten off track with the plan. She states she is doing a lot of comfort food eating with carbs. She is ready to restart the plan. We were unable to weigh the patient today for this TeleHealth visit. She states her weight today was 228 lbs. She has lost 10 lbs since starting treatment with [name].  Vitamin D deficiency Rhonda Aguilar has a diagnosis of Vitamin D deficiency. She is currently taking prescription Vit D and denies nausea, vomiting or muscle weakness.  Diabetes II Rhonda Aguilar has a diagnosis of diabetes type II and is on no medication. Rhonda Aguilar states fasting blood sugars average 125. She has been working on intensive lifestyle modifications including diet, exercise, and weight loss to help control her blood glucose levels. She does report polyphagia.  ASSESSMENT AND PLAN:  Vitamin D deficiency - Plan: Vitamin D, Ergocalciferol, (DRISDOL) 1.25 MG (50000 UT) CAPS capsule  Type 2 diabetes mellitus without complication, without long-term current use of insulin ([name])  Class 3 severe obesity with serious comorbidity and body mass index (BMI) of 40.0 to 44.9 in adult, unspecified obesity type ([place])  PLAN:  Vitamin D Deficiency Rhonda Aguilar was informed that low Vitamin D levels contributes to fatigue and are associated with obesity, breast, and colon cancer. She agrees to continue to take  prescription Vit D @ 50,000 IU every week #4 with 0 refills and will follow-up for routine testing of Vitamin D, at least 2-3 times per year. She was informed of the risk of over-replacement of Vitamin D and agrees to not increase her dose unless she discusses this with [name] first. Rhonda Aguilar agrees to follow-up with our clinic in 2 weeks.  Diabetes II Rhonda Aguilar has been given extensive diabetes education by myself today including ideal fasting and post-prandial blood glucose readings, individual ideal HgA1c goals  and hypoglycemia prevention. We discussed the importance of good blood sugar control to decrease the likelihood of diabetic complications such as nephropathy, neuropathy, limb loss, blindness, coronary artery disease, and death. We discussed the importance of intensive lifestyle modification including diet, exercise and weight loss as the first line treatment for diabetes. Rhonda Aguilar will restart metformin 1/2 500 mg daily with lunch (already has) and agrees to follow-up with our clinic in 2 weeks.  Obesity Rhonda Aguilar is currently in the action stage of change. As such, her goal is to continue with weight loss efforts. She has agreed to follow the Category 3 plan. Rhonda Aguilar has been instructed to work up to a goal of 150 minutes of combined cardio and strengthening exercise per week for weight loss and overall health benefits. We discussed the following Behavioral Modification Strategies today: no skipping meals, work on meal planning and easy cooking plans.  Rhonda Aguilar has agreed to follow-up with our clinic in 2 weeks. She was informed of the importance of frequent follow-up visits to maximize her success with intensive lifestyle modifications  for her multiple health conditions.  ALLERGIES: Allergies  Allergen Reactions  . Codeine Itching and Rash  . Penicillins Rash    MEDICATIONS: Current Outpatient Medications on File Prior to Visit  Medication Sig Dispense Refill  . ALPRAZolam (XANAX) 0.5 MG tablet Take  0.25-1 mg by mouth every 8 (eight) hours as needed for anxiety.    . busPIRone (BUSPAR) 5 MG tablet Take 5 mg by mouth 2 (two) times daily.    . cetirizine (ZYRTEC) 10 MG tablet Take 10 mg by mouth daily.    . cyclobenzaprine (FLEXERIL) 10 MG tablet Take 10 mg by mouth 3 (three) times daily as needed for muscle spasms.    [name] dexlansoprazole (DEXILANT) 60 MG capsule Take 60 mg by mouth daily.    [name] ibuprofen (ADVIL,MOTRIN) 600 MG tablet Take 600 mg by mouth every 6 (six) hours as needed for mild pain.    [name] Peppermint Oil (IBGARD PO) Take by mouth.    . Probiotic Product (ALIGN) 4 MG CAPS Take by mouth.    . promethazine (PHENERGAN) 12.5 MG tablet Take 1 tablet (12.5 mg total) by mouth every 8 (eight) hours as needed for nausea, vomiting or refractory nausea / vomiting. 60 tablet 3  . valsartan (DIOVAN) 320 MG tablet Take 320 mg by mouth daily.     . Verapamil HCl CR 300 MG CP24 Take 300 mg by mouth at bedtime.      No current facility-administered medications on file prior to visit.     PAST MEDICAL HISTORY: Past Medical History:  Diagnosis Date  . Benign essential HTN 11/22/2014  . Diabetes mellitus, type II ([place])   . Fatty liver   . GERD (gastroesophageal reflux disease)   . Hypertension   . IBS (irritable bowel syndrome)   . Migraine   . Obesity (BMI 30-39.9) 11/22/2014  . OSA (obstructive sleep apnea)     PAST SURGICAL HISTORY: Past Surgical History:  Procedure Laterality Date  . COLONOSCOPY    . TONSILLECTOMY      SOCIAL HISTORY: Social History   Tobacco Use  . Smoking status: Never Smoker  . Smokeless tobacco: Never Used  Substance Use Topics  . Alcohol use: Yes    Alcohol/week: 0.0 standard drinks  . Drug use: Not on file    FAMILY HISTORY: Family History  Problem Relation Age of Onset  . Diabetes Mother   . Heart disease Mother   . Hypertension Mother   . High Cholesterol Mother   . Kidney disease Mother   . Depression Mother   . Obesity Mother   .  Coronary artery disease Father   . High blood pressure Father   . High Cholesterol Father   . Depression Father   . Hypertension Brother    ROS: Review of Systems  Gastrointestinal: Negative for nausea and vomiting.  Musculoskeletal:       Negative for muscle weakness.  Endo/Heme/Allergies:       Positive for polyphagia.   PHYSICAL EXAM: Pt in no acute distress  RECENT LABS AND TESTS: BMET    Component Value Date/Time   NA 138 12/08/2017 1306   K 4.1 12/08/2017 1306   CL 101 12/08/2017 1306   CO2 18 (L) 12/08/2017 1306   GLUCOSE 122 (H) 12/08/2017 1306   BUN 17 12/08/2017 1306   CREATININE 0.73 12/08/2017 1306   CALCIUM 9.2 12/08/2017 1306   GFRNONAA 87 12/08/2017 1306   GFRAA 101 12/08/2017 1306   No results found for: HGBA1C  Lab Results  Component Value Date   INSULIN 40.9 (H) 04/12/2018   INSULIN 38.2 (H) 12/08/2017   CBC    Component Value Date/Time   WBC 8.5 12/08/2017 1306   WBC 16.3 (H) 12/19/2014 1120   RBC 4.64 12/08/2017 1306   RBC 4.92 12/19/2014 1120   HGB 12.9 12/08/2017 1306   HCT 38.1 12/08/2017 1306   PLT 263 12/19/2014 1120   MCV 82 12/08/2017 1306   MCH 27.8 12/08/2017 1306   MCH 27.8 12/19/2014 1120   MCHC 33.9 12/08/2017 1306   MCHC 33.5 12/19/2014 1120   RDW 14.0 12/08/2017 1306   LYMPHSABS 2.5 12/08/2017 1306   MONOABS 1.5 (H) 12/19/2014 1120   EOSABS 0.2 12/08/2017 1306   BASOSABS 0.0 12/08/2017 1306   Iron/TIBC/Ferritin/ %Sat No results found for: IRON, TIBC, FERRITIN, IRONPCTSAT Lipid Panel     Component Value Date/Time   CHOL 220 (H) 04/12/2018 1036   TRIG 196 (H) 04/12/2018 1036   HDL 43 04/12/2018 1036   LDLCALC 138 (H) 04/12/2018 1036   Hepatic Function Panel     Component Value Date/Time   PROT 6.7 12/08/2017 1306   ALBUMIN 4.4 12/08/2017 1306   AST 90 (H) 12/08/2017 1306   ALT 118 (H) 12/08/2017 1306   ALKPHOS 86 12/08/2017 1306   BILITOT 0.5 12/08/2017 1306      Component Value Date/Time   TSH 2.550  12/08/2017 1306   Results for ANGELLYNN, KIMBERLIN (MRN [id number]) as of 06/07/2018 16:43  Ref. Range 04/12/2018 10:36  Vitamin D, 25-Hydroxy Latest Ref Range: 30.0 - 100.0 ng/mL 26.7 (L)   I, Michaelene Song, am acting as Location manager for [employer], PA-C I, [name], PA-C have reviewed above note and agree with its content

## 2018-06-14 MED FILL — VIT D2 1.25 MG (50,000 UNIT: 1.25 MG | 28 days supply | Qty: 4 | Fill #0

## 2018-06-20 ENCOUNTER — Other Ambulatory Visit: Payer: Self-pay

## 2018-06-20 ENCOUNTER — Ambulatory Visit (INDEPENDENT_AMBULATORY_CARE_PROVIDER_SITE_OTHER): Payer: 59 | Admitting: Family Medicine

## 2018-06-20 ENCOUNTER — Encounter (INDEPENDENT_AMBULATORY_CARE_PROVIDER_SITE_OTHER): Payer: Self-pay | Admitting: Family Medicine

## 2018-06-20 DIAGNOSIS — Z6841 Body Mass Index (BMI) 40.0 and over, adult: Secondary | ICD-10-CM

## 2018-06-20 DIAGNOSIS — E119 Type 2 diabetes mellitus without complications: Secondary | ICD-10-CM | POA: Diagnosis not present

## 2018-06-20 DIAGNOSIS — G4733 Obstructive sleep apnea (adult) (pediatric): Secondary | ICD-10-CM | POA: Diagnosis not present

## 2018-06-20 DIAGNOSIS — E559 Vitamin D deficiency, unspecified: Secondary | ICD-10-CM

## 2018-06-20 MED ORDER — EMPAGLIFLOZIN 10 MG PO TABS: 10.0000 mg | ORAL_TABLET | Freq: Every day | ORAL | 0 refills | Status: DC

## 2018-06-20 MED FILL — JARDIANCE 10 MG TABLET: 10 | 30 days supply | Qty: 30 | Fill #0

## 2018-06-20 NOTE — Progress Notes (Signed)
Office: [phone]  /  Fax: [phone] TeleHealth Visit:  Rhonda Aguilar has verbally consented to this TeleHealth visit today. The patient is located at home, the provider is located at the [facility] office. The participants in this visit include the listed provider and patient. The visit was conducted today via Webex.  HPI:   Chief Complaint: OBESITY Rhonda Aguilar is here to discuss her progress with her obesity treatment plan. She is on the Category 3 plan and is following her eating plan approximately 50% of the time. She states she is exercising 0 minutes 0 times per week. Rhonda Aguilar states she weighed 229 lbs today at home, reflecting a 1 lb weight gain. She states she is struggling with sticking to the plan but does better on work days.She is getting the protein in. She has been off track recently because she had time off work. She reports sometimes going over the 300 extra calories but does like the structure of the plan. We were unable to weigh the patient today for this TeleHealth visit. She states she weighed 229 lbs today at home. She has lost 10 lbs since starting treatment with [name].  Diabetes Mellitus Jalila has a diagnosis of diabetes mellitus which is well controlled. She is not currently taking medication for diabetes. She is not taking metformin because it causes migraines and Rybelsus caused nausea.Rhonda Aguilar states fasting blood sugars have been higher recently: fasting blood sugars are in the range of 128 and 141; 2 hour postprandial 129. She denies any hypoglycemic episodes. Last A1c was reported to be 6.2 on 02/09/2018 (not in Epic). She has been working on intensive lifestyle modifications including diet, exercise, and weight loss to help control her blood glucose levels.   Vitamin D deficiency Chakita has a diagnosis of Vitamin D deficiency, which is not at goal. Her last Vitamin D level was reported to be 26.7 on 04/12/2018. She is currently taking prescription Vit D  and denies nausea, vomiting or muscle weakness.  ASSESSMENT AND PLAN:  Vitamin D deficiency  Type 2 diabetes mellitus without complication, without long-term current use of insulin ([name]) - Plan: empagliflozin (JARDIANCE) 10 MG TABS tablet  Class 3 severe obesity with serious comorbidity and body mass index (BMI) of 40.0 to 44.9 in adult, unspecified obesity type ([place])  PLAN:  Diabetes Mellitus Rhonda Aguilar has been given extensive diabetes education by myself today including ideal fasting and post-prandial blood glucose readings, individual ideal HgA1c goals  and hypoglycemia prevention. We discussed the importance of good blood sugar control to decrease the likelihood of diabetic complications such as nephropathy, neuropathy, limb loss, blindness, coronary artery disease, and death. We discussed the importance of intensive lifestyle modification including diet, exercise and weight loss as the first line treatment for diabetes. Rhonda Aguilar was given a new prescription for Jardiance 10 mg QAM #30 with 0 refills and agrees to follow-up with our clinic in 2-3 weeks.  Vitamin D Deficiency Rhonda Aguilar was informed that low Vitamin D levels contributes to fatigue and are associated with obesity, breast, and colon cancer. She agrees to continue to take prescription Vit D (no refill needed) and will follow-up for routine testing of Vitamin D, at least 2-3 times per year. She was informed of the risk of over-replacement of Vitamin D and agrees to not increase her dose unless she discusses this with [name] first. Rhonda Aguilar agrees to follow-up with our clinic in 2-3 weeks.  Obesity Rhonda Aguilar is currently in the action stage of change. As  such, her goal is to continue with weight loss efforts. She has agreed to follow the Category 3 plan. Cinde has been instructed to walk for 30 minutes 3 times per week and to increase her activity in general for weight loss and overall health benefits. We discussed the following Behavioral  Modification Strategies today: decreasing simple carbohydrates and planning for success.  Rhonda Aguilar has agreed to follow-up with our clinic in 2-3 weeks. She was informed of the importance of frequent follow-up visits to maximize her success with intensive lifestyle modifications for her multiple health conditions.  ALLERGIES: Allergies  Allergen Reactions  . Codeine Itching and Rash  . Penicillins Rash    MEDICATIONS: Current Outpatient Medications on File Prior to Visit  Medication Sig Dispense Refill  . ALPRAZolam (XANAX) 0.5 MG tablet Take 0.25-1 mg by mouth every 8 (eight) hours as needed for anxiety.    . busPIRone (BUSPAR) 5 MG tablet Take 5 mg by mouth 2 (two) times daily.    . cetirizine (ZYRTEC) 10 MG tablet Take 10 mg by mouth daily.    . cyclobenzaprine (FLEXERIL) 10 MG tablet Take 10 mg by mouth 3 (three) times daily as needed for muscle spasms.    [name] dexlansoprazole (DEXILANT) 60 MG capsule Take 60 mg by mouth daily.    [name] ibuprofen (ADVIL,MOTRIN) 600 MG tablet Take 600 mg by mouth every 6 (six) hours as needed for mild pain.    [name] Peppermint Oil (IBGARD PO) Take by mouth.    . Probiotic Product (ALIGN) 4 MG CAPS Take by mouth.    . promethazine (PHENERGAN) 12.5 MG tablet Take 1 tablet (12.5 mg total) by mouth every 8 (eight) hours as needed for nausea, vomiting or refractory nausea / vomiting. 60 tablet 3  . valsartan (DIOVAN) 320 MG tablet Take 320 mg by mouth daily.     . Verapamil HCl CR 300 MG CP24 Take 300 mg by mouth at bedtime.     . Vitamin D, Ergocalciferol, (DRISDOL) 1.25 MG (50000 UT) CAPS capsule TAKE 1 CAPSULE BY MOUTH EVERY 7 DAYS 4 capsule 0   No current facility-administered medications on file prior to visit.     PAST MEDICAL HISTORY: Past Medical History:  Diagnosis Date  . Benign essential HTN 11/22/2014  . Diabetes mellitus, type II ([place])   . Fatty liver   . GERD (gastroesophageal reflux disease)   . Hypertension   . IBS (irritable bowel syndrome)   .  Migraine   . Obesity (BMI 30-39.9) 11/22/2014  . OSA (obstructive sleep apnea)     PAST SURGICAL HISTORY: Past Surgical History:  Procedure Laterality Date  . COLONOSCOPY    . TONSILLECTOMY      SOCIAL HISTORY: Social History   Tobacco Use  . Smoking status: Never Smoker  . Smokeless tobacco: Never Used  Substance Use Topics  . Alcohol use: Yes    Alcohol/week: 0.0 standard drinks  . Drug use: Not on file    FAMILY HISTORY: Family History  Problem Relation Age of Onset  . Diabetes Mother   . Heart disease Mother   . Hypertension Mother   . High Cholesterol Mother   . Kidney disease Mother   . Depression Mother   . Obesity Mother   . Coronary artery disease Father   . High blood pressure Father   . High Cholesterol Father   . Depression Father   . Hypertension Brother    ROS: Review of Systems  Gastrointestinal: Negative for nausea and  vomiting.  Musculoskeletal:       Negative for muscle weakness.  Endo/Heme/Allergies:       Negative for hypoglycemia.   PHYSICAL EXAM: Pt in no acute distress  RECENT LABS AND TESTS: BMET    Component Value Date/Time   NA 138 12/08/2017 1306   K 4.1 12/08/2017 1306   CL 101 12/08/2017 1306   CO2 18 (L) 12/08/2017 1306   GLUCOSE 122 (H) 12/08/2017 1306   BUN 17 12/08/2017 1306   CREATININE 0.73 12/08/2017 1306   CALCIUM 9.2 12/08/2017 1306   GFRNONAA 87 12/08/2017 1306   GFRAA 101 12/08/2017 1306   No results found for: HGBA1C Lab Results  Component Value Date   INSULIN 40.9 (H) 04/12/2018   INSULIN 38.2 (H) 12/08/2017   CBC    Component Value Date/Time   WBC 8.5 12/08/2017 1306   WBC 16.3 (H) 12/19/2014 1120   RBC 4.64 12/08/2017 1306   RBC 4.92 12/19/2014 1120   HGB 12.9 12/08/2017 1306   HCT 38.1 12/08/2017 1306   PLT 263 12/19/2014 1120   MCV 82 12/08/2017 1306   MCH 27.8 12/08/2017 1306   MCH 27.8 12/19/2014 1120   MCHC 33.9 12/08/2017 1306   MCHC 33.5 12/19/2014 1120   RDW 14.0 12/08/2017 1306    LYMPHSABS 2.5 12/08/2017 1306   MONOABS 1.5 (H) 12/19/2014 1120   EOSABS 0.2 12/08/2017 1306   BASOSABS 0.0 12/08/2017 1306   Iron/TIBC/Ferritin/ %Sat No results found for: IRON, TIBC, FERRITIN, IRONPCTSAT Lipid Panel     Component Value Date/Time   CHOL 220 (H) 04/12/2018 1036   TRIG 196 (H) 04/12/2018 1036   HDL 43 04/12/2018 1036   LDLCALC 138 (H) 04/12/2018 1036   Hepatic Function Panel     Component Value Date/Time   PROT 6.7 12/08/2017 1306   ALBUMIN 4.4 12/08/2017 1306   AST 90 (H) 12/08/2017 1306   ALT 118 (H) 12/08/2017 1306   ALKPHOS 86 12/08/2017 1306   BILITOT 0.5 12/08/2017 1306      Component Value Date/Time   TSH 2.550 12/08/2017 1306   Results for TIAIRA, ARAMBULA (MRN [id number]) as of 06/20/2018 12:02  Ref. Range 04/12/2018 10:36  Vitamin D, 25-Hydroxy Latest Ref Range: 30.0 - 100.0 ng/mL 26.7 (L)   I, Michaelene Song, am acting as Location manager for [employer], [name].  I have reviewed the above documentation for accuracy and completeness, and I agree with the above.  - Lacrystal Barbe, [name].

## 2018-06-27 DIAGNOSIS — F341 Dysthymic disorder: Secondary | ICD-10-CM | POA: Diagnosis not present

## 2018-07-05 ENCOUNTER — Other Ambulatory Visit: Payer: Self-pay

## 2018-07-05 ENCOUNTER — Encounter: Payer: Self-pay | Admitting: Physician Assistant

## 2018-07-05 ENCOUNTER — Ambulatory Visit: Payer: 59 | Admitting: Physician Assistant

## 2018-07-05 ENCOUNTER — Ambulatory Visit (INDEPENDENT_AMBULATORY_CARE_PROVIDER_SITE_OTHER): Payer: 59 | Admitting: Physician Assistant

## 2018-07-05 DIAGNOSIS — F331 Major depressive disorder, recurrent, moderate: Secondary | ICD-10-CM | POA: Diagnosis not present

## 2018-07-05 DIAGNOSIS — E66813 Obesity, class 3: Secondary | ICD-10-CM

## 2018-07-05 DIAGNOSIS — Z6841 Body Mass Index (BMI) 40.0 and over, adult: Secondary | ICD-10-CM | POA: Diagnosis not present

## 2018-07-05 DIAGNOSIS — E559 Vitamin D deficiency, unspecified: Secondary | ICD-10-CM | POA: Diagnosis not present

## 2018-07-05 DIAGNOSIS — F411 Generalized anxiety disorder: Secondary | ICD-10-CM

## 2018-07-05 DIAGNOSIS — E119 Type 2 diabetes mellitus without complications: Secondary | ICD-10-CM | POA: Diagnosis not present

## 2018-07-05 MED ORDER — BUSPIRONE HCL 5 MG PO TABS: 5.0000 mg | ORAL_TABLET | Freq: Every day | ORAL | 3 refills | Status: DC

## 2018-07-05 MED ORDER — VITAMIN D (ERGOCALCIFEROL) 1.25 MG (50000 UNIT) PO CAPS: ORAL_CAPSULE | ORAL | 0 refills | Status: DC

## 2018-07-05 MED FILL — DEXILANT DR 60 MG CAPSULE: 60 | 30 days supply | Qty: 30 | Fill #10

## 2018-07-05 NOTE — Progress Notes (Signed)
[facility] Check  Patient ID: Rhonda Aguilar,  MRN: [id number]  PCP: Pa, Cale  Date of Evaluation: 07/05/2018 Time spent:15 minutes  Chief Complaint:  Chief Complaint    Follow-up     Virtual Visit via Telephone Note  I connected with patient by a video enabled telemedicine application or telephone, with their informed consent, and verified patient privacy and that I am speaking with the correct person using two identifiers.  I am private, at [place] and the patient is home.   I discussed the limitations, risks, security and privacy concerns of performing an evaluation and management service by telephone and the availability of in person appointments. I also discussed with the patient that there may be a patient responsible charge related to this service. The patient expressed understanding and agreed to proceed.   I discussed the assessment and treatment plan with the patient. The patient was provided an opportunity to ask questions and all were answered. The patient agreed with the plan and demonstrated an understanding of the instructions.   The patient was advised to call back or seek an in-person evaluation if the symptoms worsen or if the condition fails to improve as anticipated.  I provided 15 minutes of non-face-to-face time during this encounter.  HISTORY/CURRENT STATUS: HPI For routine med check.  Has had some triggered anxiety lately.  Financially b/c hopes to retire within the next year. 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 If it wasn't for all that, states she'd be doing pretty good.    Patient denies loss of interest in usual activities and is able to enjoy things.  Denies decreased energy or motivation.  Appetite has not changed.  No extreme sadness, tearfulness, or feelings of hopelessness.  Denies any changes in concentration, making decisions or remembering things.   Denies suicidal or homicidal thoughts.  Denies dizziness, syncope, seizures, numbness, tingling, tremor, tics, unsteady gait, slurred speech, confusion.  Denies muscle or joint pain, stiffness, or dystonia.  Individual Medical History/ Review of Systems: Changes? :No    Past medications for mental health diagnoses include: Pristiq  Allergies: Codeine and Penicillins  Current Medications:  Current Outpatient Medications:  .  ALPRAZolam (XANAX) 0.5 MG tablet, Take 0.25-1 mg by mouth every 8 (eight) hours as needed for anxiety., Disp: , Rfl:  .  busPIRone (BUSPAR) 5 MG tablet, Take 5 mg by mouth daily. , Disp: , Rfl:  .  cetirizine (ZYRTEC) 10 MG tablet, Take 10 mg by mouth daily., Disp: , Rfl:  .  cyclobenzaprine (FLEXERIL) 10 MG tablet, Take 10 mg by mouth 3 (three) times daily as needed for muscle spasms., Disp: , Rfl:  .  dexlansoprazole (DEXILANT) 60 MG capsule, Take 60 mg by mouth daily., Disp: , Rfl:  .  empagliflozin (JARDIANCE) 10 MG TABS tablet, Take 10 mg by mouth daily., Disp: 30 tablet, Rfl: 0 .  ibuprofen (ADVIL,MOTRIN) 600 MG tablet, Take 600 mg by mouth every 6 (six) hours as needed for mild pain., Disp: , Rfl:  .  Probiotic Product (ALIGN) 4 MG CAPS, Take by mouth., Disp: , Rfl:  .  promethazine (PHENERGAN) 12.5 MG tablet, Take 1 tablet (12.5 mg total) by mouth every 8 (eight) hours as needed for nausea, vomiting or refractory nausea / vomiting., Disp: 60 tablet, Rfl: 3 .  valsartan (DIOVAN) 320 MG tablet, Take 320 mg by mouth daily. , Disp: , Rfl:  .  Verapamil HCl CR 300 MG CP24, Take 300 mg by mouth at bedtime. , Disp: , Rfl:  .  Vitamin D, Ergocalciferol, (DRISDOL) 1.25 MG (50000 UT) CAPS capsule, TAKE 1 CAPSULE BY MOUTH EVERY 7 DAYS, Disp: 4 capsule, Rfl: 0 .  Peppermint Oil (IBGARD PO), Take by mouth., Disp: , Rfl:  Medication Side Effects: none  Family Medical/ Social History: Changes? Yes coronavirus pandemic and working from home some.  MENTAL HEALTH  EXAM:  There were no vitals taken for this visit.There is no height or weight on file to calculate BMI.  General Appearance: unable to assess  Eye Contact:  unable to assess  Speech:  Clear and Coherent  Volume:  Normal  Mood:  Euthymic  Affect:  unable to assess  Thought Process:  Goal Directed  Orientation:  Full (Time, Place, and Person)  Thought Content: Logical   Suicidal Thoughts:  No  Homicidal Thoughts:  No  Memory:  WNL  Judgement:  Good  Insight:  Good  Psychomotor Activity:  unable to assess  Concentration:  Concentration: Good  Recall:  Good  Fund of Knowledge: Good  Language: Good  Assets:  Desire for Improvement  ADL's:  Intact  Cognition: WNL  Prognosis:  Good    DIAGNOSES:    ICD-10-CM   1. Generalized anxiety disorder F41.1   2. Major depressive disorder, recurrent episode, moderate ([place]) F33.1     Receiving Psychotherapy: No    RECOMMENDATIONS:  We discussed different options of increasing BuSpar her living things the same.  She feels more comfortable leaving things like they are.  Feels that much of the anxiety is just due to her current circumstances with coronavirus, working from home, etc. Continue Buspar 5 mg qd. Continue Xanax 0.5mg  bid prn.  (Rare) Continue psychotherapy. Return in 6 months.  Donnal Moat, PA-C   This record has been created using [employer].  Chart creation errors have been sought, but may not always have been located and corrected. Such creation errors do not reflect on the standard of medical care.

## 2018-07-06 MED FILL — VIT D2 1.25 MG (50,000 UNIT: 1.25 MG | 28 days supply | Qty: 4 | Fill #0

## 2018-07-06 MED FILL — busPIRone HCL 5 MG TABS: 5 | 90 days supply | Qty: 90 | Fill #0

## 2018-07-06 NOTE — Progress Notes (Signed)
Office: [phone]  /  Fax: [phone] TeleHealth Visit:  Rhonda Aguilar has verbally consented to this TeleHealth visit today. The patient is located at home, the provider is located at the [employer] [facility] office. The participants in this visit include the listed provider and patient. The visit was conducted today via Webex.  HPI:   Chief Complaint: OBESITY Rhonda Aguilar is here to discuss her progress with her obesity treatment plan. She is on the Category 3 plan and is following her eating plan approximately 80% of the time. She states she is exercising 0 minutes 0 times per week. [name] reports that her weight today is 228 lbs. She is overeating her snack calories throughout the day. She wants to start making a checklist of her snacks daily.  We were unable to weigh the patient today for this TeleHealth visit. She states her weight today is 228 lbs. She has lost 10 lbs since starting treatment with [name].  Vitamin D deficiency Rhonda Aguilar has a diagnosis of Vitamin D deficiency. She is currently taking prescription Vit D and denies nausea, vomiting or muscle weakness.  Diabetes II [name] has a diagnosis of diabetes type II and is on Jardiance. [name] states fasting blood sugars range between 120 and 140. She has been working on intensive lifestyle modifications including diet, exercise, and weight loss to help control her blood glucose levels.  ASSESSMENT AND PLAN:  Vitamin D deficiency - Plan: Vitamin D, Ergocalciferol, (DRISDOL) 1.25 MG (50000 UT) CAPS capsule  Type 2 diabetes mellitus without complication, without long-term current use of insulin ([name])  Class 3 severe obesity with serious comorbidity and body mass index (BMI) of 40.0 to 44.9 in adult, unspecified obesity type ([place])  PLAN:  Vitamin D Deficiency Rhonda Aguilar was informed that low Vitamin D levels contributes to fatigue and are associated with obesity, breast, and colon cancer. She agrees to continue to take prescription Vit  D @ 50,000 IU every week #4 with 0 refills and will follow-up for routine testing of Vitamin D, at least 2-3 times per year. She was informed of the risk of over-replacement of Vitamin D and agrees to not increase her dose unless she discusses this with [name] first. Rhonda Aguilar agrees to follow-up with our clinic in 3 weeks.  Diabetes II Rhonda Aguilar has been given extensive diabetes education by myself today including ideal fasting and post-prandial blood glucose readings, individual ideal HgA1c goals  and hypoglycemia prevention. We discussed the importance of good blood sugar control to decrease the likelihood of diabetic complications such as nephropathy, neuropathy, limb loss, blindness, coronary artery disease, and death. We discussed the importance of intensive lifestyle modification including diet, exercise and weight loss as the first line treatment for diabetes. Rhonda Aguilar agrees to continue her diabetes medications and will follow-up at the agreed upon time.  Obesity Rhonda Aguilar is currently in the action stage of change. As such, her goal is to continue with weight loss efforts. She has agreed to follow the Category 3 plan. Rhonda Aguilar has been instructed to work up to a goal of 150 minutes of combined cardio and strengthening exercise per week for weight loss and overall health benefits. We discussed the following Behavioral Modification Strategies today: work on meal planning, easy cooking plans, and better snacking choices.  Rhonda Aguilar has agreed to follow-up with our clinic in 3 weeks. She was informed of the importance of frequent follow-up visits to maximize her success with intensive lifestyle modifications for her multiple health conditions.  ALLERGIES: Allergies  Allergen  Reactions  . Codeine Itching and Rash  . Penicillins Rash    MEDICATIONS: Current Outpatient Medications on File Prior to Visit  Medication Sig Dispense Refill  . ALPRAZolam (XANAX) 0.5 MG tablet Take 0.25-1 mg by mouth every 8 (eight) hours  as needed for anxiety.    . cetirizine (ZYRTEC) 10 MG tablet Take 10 mg by mouth daily.    . cyclobenzaprine (FLEXERIL) 10 MG tablet Take 10 mg by mouth 3 (three) times daily as needed for muscle spasms.    [name] dexlansoprazole (DEXILANT) 60 MG capsule Take 60 mg by mouth daily.    . empagliflozin (JARDIANCE) 10 MG TABS tablet Take 10 mg by mouth daily. 30 tablet 0  . ibuprofen (ADVIL,MOTRIN) 600 MG tablet Take 600 mg by mouth every 6 (six) hours as needed for mild pain.    [name] Peppermint Oil (IBGARD PO) Take by mouth.    . Probiotic Product (ALIGN) 4 MG CAPS Take by mouth.    . promethazine (PHENERGAN) 12.5 MG tablet Take 1 tablet (12.5 mg total) by mouth every 8 (eight) hours as needed for nausea, vomiting or refractory nausea / vomiting. 60 tablet 3  . valsartan (DIOVAN) 320 MG tablet Take 320 mg by mouth daily.     . Verapamil HCl CR 300 MG CP24 Take 300 mg by mouth at bedtime.      No current facility-administered medications on file prior to visit.     PAST MEDICAL HISTORY: Past Medical History:  Diagnosis Date  . Benign essential HTN 11/22/2014  . Diabetes mellitus, type II ([place])   . Fatty liver   . GERD (gastroesophageal reflux disease)   . Hypertension   . IBS (irritable bowel syndrome)   . Migraine   . Obesity (BMI 30-39.9) 11/22/2014  . OSA (obstructive sleep apnea)     PAST SURGICAL HISTORY: Past Surgical History:  Procedure Laterality Date  . COLONOSCOPY    . TONSILLECTOMY      SOCIAL HISTORY: Social History   Tobacco Use  . Smoking status: Never Smoker  . Smokeless tobacco: Never Used  Substance Use Topics  . Alcohol use: Yes    Alcohol/week: 0.0 - 1.0 standard drinks  . Drug use: Never    FAMILY HISTORY: Family History  Problem Relation Age of Onset  . Diabetes Mother   . Heart disease Mother   . Hypertension Mother   . High Cholesterol Mother   . Kidney disease Mother   . Depression Mother   . Obesity Mother   . Coronary artery disease Father   .  High blood pressure Father   . High Cholesterol Father   . Depression Father   . Hypertension Brother    ROS: Review of Systems  Gastrointestinal: Negative for diarrhea, nausea and vomiting.  Musculoskeletal:       Negative for muscle weakness.   PHYSICAL EXAM: Pt in no acute distress  RECENT LABS AND TESTS: BMET    Component Value Date/Time   NA 138 12/08/2017 1306   K 4.1 12/08/2017 1306   CL 101 12/08/2017 1306   CO2 18 (L) 12/08/2017 1306   GLUCOSE 122 (H) 12/08/2017 1306   BUN 17 12/08/2017 1306   CREATININE 0.73 12/08/2017 1306   CALCIUM 9.2 12/08/2017 1306   GFRNONAA 87 12/08/2017 1306   GFRAA 101 12/08/2017 1306   No results found for: HGBA1C Lab Results  Component Value Date   INSULIN 40.9 (H) 04/12/2018   INSULIN 38.2 (H) 12/08/2017   CBC  Component Value Date/Time   WBC 8.5 12/08/2017 1306   WBC 16.3 (H) 12/19/2014 1120   RBC 4.64 12/08/2017 1306   RBC 4.92 12/19/2014 1120   HGB 12.9 12/08/2017 1306   HCT 38.1 12/08/2017 1306   PLT 263 12/19/2014 1120   MCV 82 12/08/2017 1306   MCH 27.8 12/08/2017 1306   MCH 27.8 12/19/2014 1120   MCHC 33.9 12/08/2017 1306   MCHC 33.5 12/19/2014 1120   RDW 14.0 12/08/2017 1306   LYMPHSABS 2.5 12/08/2017 1306   MONOABS 1.5 (H) 12/19/2014 1120   EOSABS 0.2 12/08/2017 1306   BASOSABS 0.0 12/08/2017 1306   Iron/TIBC/Ferritin/ %Sat No results found for: IRON, TIBC, FERRITIN, IRONPCTSAT Lipid Panel     Component Value Date/Time   CHOL 220 (H) 04/12/2018 1036   TRIG 196 (H) 04/12/2018 1036   HDL 43 04/12/2018 1036   LDLCALC 138 (H) 04/12/2018 1036   Hepatic Function Panel     Component Value Date/Time   PROT 6.7 12/08/2017 1306   ALBUMIN 4.4 12/08/2017 1306   AST 90 (H) 12/08/2017 1306   ALT 118 (H) 12/08/2017 1306   ALKPHOS 86 12/08/2017 1306   BILITOT 0.5 12/08/2017 1306      Component Value Date/Time   TSH 2.550 12/08/2017 1306   Results for TIESHIA, RETTINGER (MRN [id number]) as of 07/06/2018 08:48   Ref. Range 04/12/2018 10:36  Vitamin D, 25-Hydroxy Latest Ref Range: 30.0 - 100.0 ng/mL 26.7 (L)    I, Michaelene Song, am acting as Location manager for [employer], PA-C I, [name], PA-C have reviewed above note and agree with its content

## 2018-07-07 DIAGNOSIS — G4733 Obstructive sleep apnea (adult) (pediatric): Secondary | ICD-10-CM | POA: Diagnosis not present

## 2018-07-07 DIAGNOSIS — G4730 Sleep apnea, unspecified: Secondary | ICD-10-CM | POA: Diagnosis not present

## 2018-07-07 DIAGNOSIS — Z7984 Long term (current) use of oral hypoglycemic drugs: Secondary | ICD-10-CM | POA: Diagnosis not present

## 2018-07-07 DIAGNOSIS — E1165 Type 2 diabetes mellitus with hyperglycemia: Secondary | ICD-10-CM | POA: Diagnosis not present

## 2018-07-18 ENCOUNTER — Other Ambulatory Visit (INDEPENDENT_AMBULATORY_CARE_PROVIDER_SITE_OTHER): Payer: Self-pay | Admitting: Family Medicine

## 2018-07-18 DIAGNOSIS — E119 Type 2 diabetes mellitus without complications: Secondary | ICD-10-CM

## 2018-07-20 ENCOUNTER — Other Ambulatory Visit (INDEPENDENT_AMBULATORY_CARE_PROVIDER_SITE_OTHER): Payer: Self-pay | Admitting: Family Medicine

## 2018-07-20 DIAGNOSIS — G4733 Obstructive sleep apnea (adult) (pediatric): Secondary | ICD-10-CM | POA: Diagnosis not present

## 2018-07-20 DIAGNOSIS — E119 Type 2 diabetes mellitus without complications: Secondary | ICD-10-CM

## 2018-07-21 MED FILL — JARDIANCE 10 MG TABLET: 10 | 30 days supply | Qty: 30 | Fill #0

## 2018-07-24 ENCOUNTER — Encounter (INDEPENDENT_AMBULATORY_CARE_PROVIDER_SITE_OTHER): Payer: Self-pay | Admitting: Physician Assistant

## 2018-07-26 ENCOUNTER — Ambulatory Visit (INDEPENDENT_AMBULATORY_CARE_PROVIDER_SITE_OTHER): Payer: 59 | Admitting: Physician Assistant

## 2018-07-26 ENCOUNTER — Encounter (INDEPENDENT_AMBULATORY_CARE_PROVIDER_SITE_OTHER): Payer: Self-pay

## 2018-07-26 ENCOUNTER — Other Ambulatory Visit: Payer: Self-pay

## 2018-07-26 DIAGNOSIS — E119 Type 2 diabetes mellitus without complications: Secondary | ICD-10-CM | POA: Diagnosis not present

## 2018-07-26 DIAGNOSIS — Z6841 Body Mass Index (BMI) 40.0 and over, adult: Secondary | ICD-10-CM

## 2018-07-26 NOTE — Progress Notes (Signed)
Office: [phone]  /  Fax: [phone] TeleHealth Visit:  Rhonda Aguilar has verbally consented to this TeleHealth visit today. The patient is located at home, the provider is located at the [facility] office. The participants in this visit include the listed provider and patient. The visit was conducted today via Webex (25 minutes).  HPI:   Chief Complaint: OBESITY Rhonda Aguilar is here to discuss her progress with her obesity treatment plan. She is on the Category 3 plan and is following her eating plan approximately 80% of the time. She states she is exercising 0 minutes 0 times per week. Shiree reports her weight to be 231 lbs. She continues to overeat her snack calories. She states she has lost some motivation.  We were unable to weigh the patient today for this TeleHealth visit. She states her weight today at home was 231 lbs. She has lost 10 lbs since starting treatment with [name].  Diabetes II Without Long-Term Insulin Rhonda Aguilar has a diagnosis of diabetes type II and is on Jardiance. Rhonda Aguilar states fasting blood sugars range between 128 and 133. She has been working on intensive lifestyle modifications including diet, exercise, and weight loss to help control her blood glucose levels. She denies UTI or yeast infection.  ASSESSMENT AND PLAN:  Type 2 diabetes mellitus without complication, without long-term current use of insulin ([name])  Class 3 severe obesity with serious comorbidity and body mass index (BMI) of 40.0 to 44.9 in adult, unspecified obesity type ([place])  PLAN:  Diabetes II Without Long-Term Insulin Rhonda Aguilar has been given extensive diabetes education by myself today including ideal fasting and post-prandial blood glucose readings, individual ideal Hgb A1c goals  and hypoglycemia prevention. We discussed the importance of good blood sugar control to decrease the likelihood of diabetic complications such as nephropathy, neuropathy, limb loss, blindness, coronary artery  disease, and death. We discussed the importance of intensive lifestyle modification including diet, exercise and weight loss as the first line treatment for diabetes. Rhonda Aguilar agrees to continue her diabetes medications, continue weight loss, and will follow-up at the agreed upon time.  Obesity Rhonda Aguilar is currently in the action stage of change. As such, her goal is to continue with weight loss efforts. She has agreed to follow the Category 3 plan. Rhonda Aguilar has been instructed to work up to a goal of 150 minutes of combined cardio and strengthening exercise per week for weight loss and overall health benefits. We discussed the following Behavioral Modification Strategies today: decreasing simple carbohydrates and better snacking choices.  Rhonda Aguilar has agreed to follow-up with our clinic in 2 weeks. She was informed of the importance of frequent follow-up visits to maximize her success with intensive lifestyle modifications for her multiple health conditions.  I spent > than 50% of the 25 minute visit on counseling as documented in the note.   ALLERGIES: Allergies  Allergen Reactions   Codeine Itching and Rash   Penicillins Rash    MEDICATIONS: Current Outpatient Medications on File Prior to Visit  Medication Sig Dispense Refill   ALPRAZolam (XANAX) 0.5 MG tablet Take 0.25-1 mg by mouth every 8 (eight) hours as needed for anxiety.     busPIRone (BUSPAR) 5 MG tablet Take 1 tablet (5 mg total) by mouth daily. 90 tablet 3   cetirizine (ZYRTEC) 10 MG tablet Take 10 mg by mouth daily.     cyclobenzaprine (FLEXERIL) 10 MG tablet Take 10 mg by mouth 3 (three) times daily as needed for muscle spasms.  dexlansoprazole (DEXILANT) 60 MG capsule Take 60 mg by mouth daily.     empagliflozin (JARDIANCE) 10 MG TABS tablet Take 10 mg by mouth daily. 30 tablet 0   ibuprofen (ADVIL,MOTRIN) 600 MG tablet Take 600 mg by mouth every 6 (six) hours as needed for mild pain.     Peppermint Oil (IBGARD PO)  Take by mouth.     Probiotic Product (ALIGN) 4 MG CAPS Take by mouth.     promethazine (PHENERGAN) 12.5 MG tablet Take 1 tablet (12.5 mg total) by mouth every 8 (eight) hours as needed for nausea, vomiting or refractory nausea / vomiting. 60 tablet 3   valsartan (DIOVAN) 320 MG tablet Take 320 mg by mouth daily.      Verapamil HCl CR 300 MG CP24 Take 300 mg by mouth at bedtime.      Vitamin D, Ergocalciferol, (DRISDOL) 1.25 MG (50000 UT) CAPS capsule TAKE 1 CAPSULE BY MOUTH EVERY 7 DAYS 4 capsule 0   No current facility-administered medications on file prior to visit.     PAST MEDICAL HISTORY: Past Medical History:  Diagnosis Date   Benign essential HTN 11/22/2014   Diabetes mellitus, type II ([name])    Fatty liver    GERD (gastroesophageal reflux disease)    Hypertension    IBS (irritable bowel syndrome)    Migraine    Obesity (BMI 30-39.9) 11/22/2014   OSA (obstructive sleep apnea)     PAST SURGICAL HISTORY: Past Surgical History:  Procedure Laterality Date   COLONOSCOPY     TONSILLECTOMY      SOCIAL HISTORY: Social History   Tobacco Use   Smoking status: Never Smoker   Smokeless tobacco: Never Used  Substance Use Topics   Alcohol use: Yes    Alcohol/week: 0.0 - 1.0 standard drinks   Drug use: Never    FAMILY HISTORY: Family History  Problem Relation Age of Onset   Diabetes Mother    Heart disease Mother    Hypertension Mother    High Cholesterol Mother    Kidney disease Mother    Depression Mother    Obesity Mother    Coronary artery disease Father    High blood pressure Father    High Cholesterol Father    Depression Father    Hypertension Brother     ROS: ROS none noted.  PHYSICAL EXAM: Pt in no acute distress  RECENT LABS AND TESTS: BMET    Component Value Date/Time   NA 138 12/08/2017 1306   K 4.1 12/08/2017 1306   CL 101 12/08/2017 1306   CO2 18 (L) 12/08/2017 1306   GLUCOSE 122 (H) 12/08/2017 1306   BUN 17  12/08/2017 1306   CREATININE 0.73 12/08/2017 1306   CALCIUM 9.2 12/08/2017 1306   GFRNONAA 87 12/08/2017 1306   GFRAA 101 12/08/2017 1306   No results found for: HGBA1C Lab Results  Component Value Date   INSULIN 40.9 (H) 04/12/2018   INSULIN 38.2 (H) 12/08/2017   CBC    Component Value Date/Time   WBC 8.5 12/08/2017 1306   WBC 16.3 (H) 12/19/2014 1120   RBC 4.64 12/08/2017 1306   RBC 4.92 12/19/2014 1120   HGB 12.9 12/08/2017 1306   HCT 38.1 12/08/2017 1306   PLT 263 12/19/2014 1120   MCV 82 12/08/2017 1306   MCH 27.8 12/08/2017 1306   MCH 27.8 12/19/2014 1120   MCHC 33.9 12/08/2017 1306   MCHC 33.5 12/19/2014 1120   RDW 14.0 12/08/2017 1306  LYMPHSABS 2.5 12/08/2017 1306   MONOABS 1.5 (H) 12/19/2014 1120   EOSABS 0.2 12/08/2017 1306   BASOSABS 0.0 12/08/2017 1306   Iron/TIBC/Ferritin/ %Sat No results found for: IRON, TIBC, FERRITIN, IRONPCTSAT Lipid Panel     Component Value Date/Time   CHOL 220 (H) 04/12/2018 1036   TRIG 196 (H) 04/12/2018 1036   HDL 43 04/12/2018 1036   LDLCALC 138 (H) 04/12/2018 1036   Hepatic Function Panel     Component Value Date/Time   PROT 6.7 12/08/2017 1306   ALBUMIN 4.4 12/08/2017 1306   AST 90 (H) 12/08/2017 1306   ALT 118 (H) 12/08/2017 1306   ALKPHOS 86 12/08/2017 1306   BILITOT 0.5 12/08/2017 1306      Component Value Date/Time   TSH 2.550 12/08/2017 1306   Results for LATAMARA, MELDER (MRN [id number]) as of 07/26/2018 13:35  Ref. Range 04/12/2018 10:36  Vitamin D, 25-Hydroxy Latest Ref Range: 30.0 - 100.0 ng/mL 26.7 (L)    I, Michaelene Song, am acting as Location manager for [employer], PA-C I, [name], PA-C have reviewed above note and agree with its content

## 2018-08-08 ENCOUNTER — Other Ambulatory Visit: Payer: Self-pay

## 2018-08-08 ENCOUNTER — Encounter (INDEPENDENT_AMBULATORY_CARE_PROVIDER_SITE_OTHER): Payer: Self-pay | Admitting: Physician Assistant

## 2018-08-08 ENCOUNTER — Telehealth (INDEPENDENT_AMBULATORY_CARE_PROVIDER_SITE_OTHER): Payer: 59 | Admitting: Physician Assistant

## 2018-08-08 DIAGNOSIS — E559 Vitamin D deficiency, unspecified: Secondary | ICD-10-CM

## 2018-08-08 DIAGNOSIS — E119 Type 2 diabetes mellitus without complications: Secondary | ICD-10-CM

## 2018-08-08 DIAGNOSIS — Z6841 Body Mass Index (BMI) 40.0 and over, adult: Secondary | ICD-10-CM | POA: Diagnosis not present

## 2018-08-08 DIAGNOSIS — E66813 Obesity, class 3: Secondary | ICD-10-CM

## 2018-08-08 MED FILL — DEXILANT DR 60 MG CAPSULE: 60 | 30 days supply | Qty: 30 | Fill #11

## 2018-08-09 NOTE — Progress Notes (Signed)
Office: [phone]  /  Fax: [phone] TeleHealth Visit:  Rhonda Aguilar has verbally consented to this TeleHealth visit today. The patient is located at home, the provider is located at the [facility] office. The participants in this visit include the listed provider and patient. The visit was conducted today via Webex.  HPI:   Chief Complaint: OBESITY Rhonda Aguilar is here to discuss her progress with her obesity treatment plan. She is on the Category 3 plan and is following her eating plan approximately 60% of the time. She states she is more active at work. Rhonda Aguilar reports her weight to be 229.4 lbs. She reports that she continues to overeat her snack calories.  We were unable to weigh the patient today for this TeleHealth visit. She feels as if she has lost weight since her last visit. She has lost 10 lbs since starting treatment with [name].  Vitamin D deficiency Rhonda Aguilar has a diagnosis of Vitamin D deficiency. She is currently taking prescription Vit D and denies nausea, vomiting or muscle weakness.  Diabetes II Without Long-Term Use of Insulin Rhonda Aguilar has a diagnosis of diabetes type II and is on Jardiance. Rhonda Aguilar states fasting blood sugars are in the range of 127 and 147. Last A1c was 6.2. She has been working on intensive lifestyle modifications including diet, exercise, and weight loss to help control her blood glucose levels. No UTI or yeast infection.  ASSESSMENT AND PLAN:  No diagnosis found.  PLAN:  Vitamin D Deficiency Rhonda Aguilar was informed that low Vitamin D levels contributes to fatigue and are associated with obesity, breast, and colon cancer. She agrees to continue to take prescription Vit D @ 50,000 IU every week #4 with 0 refills and will follow-up for routine testing of Vitamin D, at least 2-3 times per year. She was informed of the risk of over-replacement of Vitamin D and agrees to not increase her dose unless she discusses this with [name] first. Rhonda Aguilar agrees to  follow-up with our clinic in 2 weeks.  Diabetes II Without Long-Term Use of Insulin Rhonda Aguilar has been given extensive diabetes education by myself today including ideal fasting and post-prandial blood glucose readings, individual ideal HgA1c goals  and hypoglycemia prevention. We discussed the importance of good blood sugar control to decrease the likelihood of diabetic complications such as nephropathy, neuropathy, limb loss, blindness, coronary artery disease, and death. We discussed the importance of intensive lifestyle modification including diet, exercise and weight loss as the first line treatment for diabetes. Rhonda Aguilar agrees to continue her diabetes medications and will follow-up at the agreed upon time.  Obesity Rhonda Aguilar is currently in the action stage of change. As such, her goal is to continue with weight loss efforts. She has agreed to follow the Category 3 plan. Rhonda Aguilar has been instructed to work up to a goal of 150 minutes of combined cardio and strengthening exercise per week for weight loss and overall health benefits. We discussed the following Behavioral Modification Strategies today: work on meal planning, easy cooking plans, and better snacking choices.  Rhonda Aguilar has agreed to follow-up with our clinic in 2 weeks. She was informed of the importance of frequent follow-up visits to maximize her success with intensive lifestyle modifications for her multiple health conditions.  ALLERGIES: Allergies  Allergen Reactions   Codeine Itching and Rash   Penicillins Rash    MEDICATIONS: Current Outpatient Medications on File Prior to Visit  Medication Sig Dispense Refill   ALPRAZolam (XANAX) 0.5 MG tablet Take 0.25-1  mg by mouth every 8 (eight) hours as needed for anxiety.     busPIRone (BUSPAR) 5 MG tablet Take 1 tablet (5 mg total) by mouth daily. 90 tablet 3   cetirizine (ZYRTEC) 10 MG tablet Take 10 mg by mouth daily.     cyclobenzaprine (FLEXERIL) 10 MG tablet Take 10 mg by mouth 3  (three) times daily as needed for muscle spasms.     dexlansoprazole (DEXILANT) 60 MG capsule Take 60 mg by mouth daily.     empagliflozin (JARDIANCE) 10 MG TABS tablet Take 10 mg by mouth daily. 30 tablet 0   ibuprofen (ADVIL,MOTRIN) 600 MG tablet Take 600 mg by mouth every 6 (six) hours as needed for mild pain.     Peppermint Oil (IBGARD PO) Take by mouth.     Probiotic Product (ALIGN) 4 MG CAPS Take by mouth.     promethazine (PHENERGAN) 12.5 MG tablet Take 1 tablet (12.5 mg total) by mouth every 8 (eight) hours as needed for nausea, vomiting or refractory nausea / vomiting. 60 tablet 3   valsartan (DIOVAN) 320 MG tablet Take 320 mg by mouth daily.      Verapamil HCl CR 300 MG CP24 Take 300 mg by mouth at bedtime.      Vitamin D, Ergocalciferol, (DRISDOL) 1.25 MG (50000 UT) CAPS capsule TAKE 1 CAPSULE BY MOUTH EVERY 7 DAYS 4 capsule 0   No current facility-administered medications on file prior to visit.     PAST MEDICAL HISTORY: Past Medical History:  Diagnosis Date   Benign essential HTN 11/22/2014   Diabetes mellitus, type II ([name])    Fatty liver    GERD (gastroesophageal reflux disease)    Hypertension    IBS (irritable bowel syndrome)    Migraine    Obesity (BMI 30-39.9) 11/22/2014   OSA (obstructive sleep apnea)     PAST SURGICAL HISTORY: Past Surgical History:  Procedure Laterality Date   COLONOSCOPY     TONSILLECTOMY      SOCIAL HISTORY: Social History   Tobacco Use   Smoking status: Never Smoker   Smokeless tobacco: Never Used  Substance Use Topics   Alcohol use: Yes    Alcohol/week: 0.0 - 1.0 standard drinks   Drug use: Never    FAMILY HISTORY: Family History  Problem Relation Age of Onset   Diabetes Mother    Heart disease Mother    Hypertension Mother    High Cholesterol Mother    Kidney disease Mother    Depression Mother    Obesity Mother    Coronary artery disease Father    High blood pressure Father    High  Cholesterol Father    Depression Father    Hypertension Brother    ROS: Review of Systems  Gastrointestinal: Negative for nausea and vomiting.  Musculoskeletal:       Negative for muscle weakness.   PHYSICAL EXAM: Pt in no acute distress  RECENT LABS AND TESTS: BMET    Component Value Date/Time   NA 138 12/08/2017 1306   K 4.1 12/08/2017 1306   CL 101 12/08/2017 1306   CO2 18 (L) 12/08/2017 1306   GLUCOSE 122 (H) 12/08/2017 1306   BUN 17 12/08/2017 1306   CREATININE 0.73 12/08/2017 1306   CALCIUM 9.2 12/08/2017 1306   GFRNONAA 87 12/08/2017 1306   GFRAA 101 12/08/2017 1306   No results found for: HGBA1C Lab Results  Component Value Date   INSULIN 40.9 (H) 04/12/2018   INSULIN 38.2 (H)  12/08/2017   CBC    Component Value Date/Time   WBC 8.5 12/08/2017 1306   WBC 16.3 (H) 12/19/2014 1120   RBC 4.64 12/08/2017 1306   RBC 4.92 12/19/2014 1120   HGB 12.9 12/08/2017 1306   HCT 38.1 12/08/2017 1306   PLT 263 12/19/2014 1120   MCV 82 12/08/2017 1306   MCH 27.8 12/08/2017 1306   MCH 27.8 12/19/2014 1120   MCHC 33.9 12/08/2017 1306   MCHC 33.5 12/19/2014 1120   RDW 14.0 12/08/2017 1306   LYMPHSABS 2.5 12/08/2017 1306   MONOABS 1.5 (H) 12/19/2014 1120   EOSABS 0.2 12/08/2017 1306   BASOSABS 0.0 12/08/2017 1306   Iron/TIBC/Ferritin/ %Sat No results found for: IRON, TIBC, FERRITIN, IRONPCTSAT Lipid Panel     Component Value Date/Time   CHOL 220 (H) 04/12/2018 1036   TRIG 196 (H) 04/12/2018 1036   HDL 43 04/12/2018 1036   LDLCALC 138 (H) 04/12/2018 1036   Hepatic Function Panel     Component Value Date/Time   PROT 6.7 12/08/2017 1306   ALBUMIN 4.4 12/08/2017 1306   AST 90 (H) 12/08/2017 1306   ALT 118 (H) 12/08/2017 1306   ALKPHOS 86 12/08/2017 1306   BILITOT 0.5 12/08/2017 1306      Component Value Date/Time   TSH 2.550 12/08/2017 1306   Results for LINDAMARIE, MACLACHLAN (MRN [id number]) as of 08/09/2018 15:10  Ref. Range 04/12/2018 10:36  Vitamin D,  25-Hydroxy Latest Ref Range: 30.0 - 100.0 ng/mL 26.7 (L)    I, Michaelene Song, am acting as Location manager for [employer], PA-C I, [name], PA-C have reviewed above note and agree with its content

## 2018-08-10 MED ORDER — VITAMIN D (ERGOCALCIFEROL) 1.25 MG (50000 UNIT) PO CAPS: ORAL_CAPSULE | ORAL | 0 refills | Status: DC

## 2018-08-10 MED FILL — VIT D2 1.25 MG (50,000 UNIT: 1.25 MG | 28 days supply | Qty: 4 | Fill #0

## 2018-08-20 DIAGNOSIS — G4733 Obstructive sleep apnea (adult) (pediatric): Secondary | ICD-10-CM | POA: Diagnosis not present

## 2018-08-21 MED FILL — VERAPAMIL ER PM 300 MG CAP: 300 | 90 days supply | Qty: 90 | Fill #1

## 2018-08-21 MED FILL — JARDIANCE 10 MG TABLET: 10 | 30 days supply | Qty: 30 | Fill #1

## 2018-08-22 ENCOUNTER — Encounter (INDEPENDENT_AMBULATORY_CARE_PROVIDER_SITE_OTHER): Payer: Self-pay | Admitting: Physician Assistant

## 2018-08-22 ENCOUNTER — Telehealth (INDEPENDENT_AMBULATORY_CARE_PROVIDER_SITE_OTHER): Payer: 59 | Admitting: Physician Assistant

## 2018-08-22 ENCOUNTER — Other Ambulatory Visit: Payer: Self-pay

## 2018-08-22 DIAGNOSIS — Z6841 Body Mass Index (BMI) 40.0 and over, adult: Secondary | ICD-10-CM | POA: Diagnosis not present

## 2018-08-22 DIAGNOSIS — E559 Vitamin D deficiency, unspecified: Secondary | ICD-10-CM

## 2018-08-22 DIAGNOSIS — E7849 Other hyperlipidemia: Secondary | ICD-10-CM | POA: Diagnosis not present

## 2018-08-23 NOTE — Progress Notes (Signed)
Office: [phone]  /  Fax: [phone] TeleHealth Visit:  Rhonda Aguilar has verbally consented to this TeleHealth visit today. The patient is located at home, the provider is located at the [employer] [facility] office. The participants in this visit include the listed provider and patient. The visit was conducted today via Webex (25 minutes).  HPI:   Chief Complaint: OBESITY Rhonda Aguilar is here to discuss her progress with her obesity treatment plan. She is on the Category 3 plan and is following her eating plan approximately 50% of the time. She states she is walking 1.25 miles 1 day per week. Rhonda Aguilar reports her weight today to be 231 lbs. She continues to overeat her snack calories and have some sweet cravings. She has been on Topamax and Wellbutrin with adverse side effects. We were unable to weigh the patient today for this TeleHealth visit. She feels as if she has maintained her weight since her last visit. She has lost 10 lbs since starting treatment with [name].  Vitamin D deficiency Rhonda Aguilar has a diagnosis of Vitamin D deficiency. She is currently taking weekly prescription Vit D and denies nausea, vomiting or muscle weakness.  Hyperlipidemia Rhonda Aguilar has hyperlipidemia and has been trying to improve her cholesterol levels with intensive lifestyle modification including a low saturated fat diet, exercise and weight loss. She is on no medication and denies any chest pain.   ASSESSMENT AND PLAN:  Vitamin D deficiency  Other hyperlipidemia  Class 3 severe obesity with serious comorbidity and body mass index (BMI) of 40.0 to 44.9 in adult, unspecified obesity type ([name])  PLAN:  Vitamin D Deficiency Rhonda Aguilar was informed that low Vitamin D levels contributes to fatigue and are associated with obesity, breast, and colon cancer. She agrees to continue taking weekly prescription Vit D and will follow-up for routine testing of Vitamin D, at least 2-3 times per year. She was informed of the  risk of over-replacement of Vitamin D and agrees to not increase her dose unless she discusses this with [name] first. Rhonda Aguilar agrees to follow-up with our clinic in 2 weeks.  Hyperlipidemia Rhonda Aguilar was informed of the American Heart Association Guidelines emphasizing intensive lifestyle modifications as the first line treatment for hyperlipidemia. We discussed many lifestyle modifications today in depth, and Amire will continue to work on decreasing saturated fats such as fatty red meat, butter and many fried foods. She will also increase vegetables and lean protein in her diet and continue to work on exercise and weight loss efforts.  Obesity Rhonda Aguilar is currently in the action stage of change. As such, her goal is to continue with weight loss efforts She has agreed to follow the Category 3 plan Rhonda Aguilar has been instructed to work up to a goal of 150 minutes of combined cardio and strengthening exercise per week for weight loss and overall health benefits. We discussed the following Behavioral Modification Strategies today: increasing lean protein intake, decreasing simple carbohydrates, work on meal planning and easy cooking plans.  Rhonda Aguilar has agreed to follow-up with our clinic in 2 weeks. She was informed of the importance of frequent follow-up visits to maximize her success with intensive lifestyle modifications for her multiple health conditions.  ALLERGIES: Allergies  Allergen Reactions   Codeine Itching and Rash   Penicillins Rash    MEDICATIONS: Current Outpatient Medications on File Prior to Visit  Medication Sig Dispense Refill   ALPRAZolam (XANAX) 0.5 MG tablet Take 0.25-1 mg by mouth every 8 (eight) hours as needed for  anxiety.     busPIRone (BUSPAR) 5 MG tablet Take 1 tablet (5 mg total) by mouth daily. 90 tablet 3   cetirizine (ZYRTEC) 10 MG tablet Take 10 mg by mouth daily.     cyclobenzaprine (FLEXERIL) 10 MG tablet Take 10 mg by mouth 3 (three) times daily as needed for muscle  spasms.     dexlansoprazole (DEXILANT) 60 MG capsule Take 60 mg by mouth daily.     empagliflozin (JARDIANCE) 10 MG TABS tablet Take 10 mg by mouth daily. 30 tablet 0   ibuprofen (ADVIL,MOTRIN) 600 MG tablet Take 600 mg by mouth every 6 (six) hours as needed for mild pain.     Peppermint Oil (IBGARD PO) Take by mouth.     Probiotic Product (ALIGN) 4 MG CAPS Take by mouth.     promethazine (PHENERGAN) 12.5 MG tablet Take 1 tablet (12.5 mg total) by mouth every 8 (eight) hours as needed for nausea, vomiting or refractory nausea / vomiting. 60 tablet 3   valsartan (DIOVAN) 320 MG tablet Take 320 mg by mouth daily.      Verapamil HCl CR 300 MG CP24 Take 300 mg by mouth at bedtime.      Vitamin D, Ergocalciferol, (DRISDOL) 1.25 MG (50000 UT) CAPS capsule TAKE 1 CAPSULE BY MOUTH EVERY 7 DAYS 4 capsule 0   No current facility-administered medications on file prior to visit.     PAST MEDICAL HISTORY: Past Medical History:  Diagnosis Date   Benign essential HTN 11/22/2014   Diabetes mellitus, type II ([name])    Fatty liver    GERD (gastroesophageal reflux disease)    Hypertension    IBS (irritable bowel syndrome)    Migraine    Obesity (BMI 30-39.9) 11/22/2014   OSA (obstructive sleep apnea)     PAST SURGICAL HISTORY: Past Surgical History:  Procedure Laterality Date   COLONOSCOPY     TONSILLECTOMY      SOCIAL HISTORY: Social History   Tobacco Use   Smoking status: Never Smoker   Smokeless tobacco: Never Used  Substance Use Topics   Alcohol use: Yes    Alcohol/week: 0.0 - 1.0 standard drinks   Drug use: Never    FAMILY HISTORY: Family History  Problem Relation Age of Onset   Diabetes Mother    Heart disease Mother    Hypertension Mother    High Cholesterol Mother    Kidney disease Mother    Depression Mother    Obesity Mother    Coronary artery disease Father    High blood pressure Father    High Cholesterol Father    Depression Father     Hypertension Brother    ROS: Review of Systems  Cardiovascular: Negative for chest pain.  Gastrointestinal: Negative for nausea and vomiting.  Musculoskeletal:       Negative for muscle weakness.   PHYSICAL EXAM: Pt in no acute distress  RECENT LABS AND TESTS: BMET    Component Value Date/Time   NA 138 12/08/2017 1306   K 4.1 12/08/2017 1306   CL 101 12/08/2017 1306   CO2 18 (L) 12/08/2017 1306   GLUCOSE 122 (H) 12/08/2017 1306   BUN 17 12/08/2017 1306   CREATININE 0.73 12/08/2017 1306   CALCIUM 9.2 12/08/2017 1306   GFRNONAA 87 12/08/2017 1306   GFRAA 101 12/08/2017 1306   No results found for: HGBA1C Lab Results  Component Value Date   INSULIN 40.9 (H) 04/12/2018   INSULIN 38.2 (H) 12/08/2017   CBC  Component Value Date/Time   WBC 8.5 12/08/2017 1306   WBC 16.3 (H) 12/19/2014 1120   RBC 4.64 12/08/2017 1306   RBC 4.92 12/19/2014 1120   HGB 12.9 12/08/2017 1306   HCT 38.1 12/08/2017 1306   PLT 263 12/19/2014 1120   MCV 82 12/08/2017 1306   MCH 27.8 12/08/2017 1306   MCH 27.8 12/19/2014 1120   MCHC 33.9 12/08/2017 1306   MCHC 33.5 12/19/2014 1120   RDW 14.0 12/08/2017 1306   LYMPHSABS 2.5 12/08/2017 1306   MONOABS 1.5 (H) 12/19/2014 1120   EOSABS 0.2 12/08/2017 1306   BASOSABS 0.0 12/08/2017 1306   Iron/TIBC/Ferritin/ %Sat No results found for: IRON, TIBC, FERRITIN, IRONPCTSAT Lipid Panel     Component Value Date/Time   CHOL 220 (H) 04/12/2018 1036   TRIG 196 (H) 04/12/2018 1036   HDL 43 04/12/2018 1036   LDLCALC 138 (H) 04/12/2018 1036   Hepatic Function Panel     Component Value Date/Time   PROT 6.7 12/08/2017 1306   ALBUMIN 4.4 12/08/2017 1306   AST 90 (H) 12/08/2017 1306   ALT 118 (H) 12/08/2017 1306   ALKPHOS 86 12/08/2017 1306   BILITOT 0.5 12/08/2017 1306      Component Value Date/Time   TSH 2.550 12/08/2017 1306   Results for COZY, VEALE (MRN [id number]) as of 08/23/2018 09:56  Ref. Range 04/12/2018 10:36  Vitamin D,  25-Hydroxy Latest Ref Range: 30.0 - 100.0 ng/mL 26.7 (L)   I, Michaelene Song, am acting as Location manager for [employer], PA-C I, [name], PA-C have reviewed above note and agree with its content

## 2018-09-04 ENCOUNTER — Other Ambulatory Visit: Payer: Self-pay | Admitting: Obstetrics and Gynecology

## 2018-09-04 DIAGNOSIS — Z1231 Encounter for screening mammogram for malignant neoplasm of breast: Secondary | ICD-10-CM

## 2018-09-07 ENCOUNTER — Encounter (INDEPENDENT_AMBULATORY_CARE_PROVIDER_SITE_OTHER): Payer: Self-pay | Admitting: Physician Assistant

## 2018-09-07 ENCOUNTER — Ambulatory Visit (INDEPENDENT_AMBULATORY_CARE_PROVIDER_SITE_OTHER): Payer: 59 | Admitting: Physician Assistant

## 2018-09-07 ENCOUNTER — Other Ambulatory Visit: Payer: Self-pay

## 2018-09-07 VITALS — BP 118/73 | HR 88 | Temp 97.7°F | Ht 62.0 in | Wt 229.0 lb

## 2018-09-07 DIAGNOSIS — Z6841 Body Mass Index (BMI) 40.0 and over, adult: Secondary | ICD-10-CM

## 2018-09-07 DIAGNOSIS — E119 Type 2 diabetes mellitus without complications: Secondary | ICD-10-CM

## 2018-09-07 DIAGNOSIS — E559 Vitamin D deficiency, unspecified: Secondary | ICD-10-CM

## 2018-09-07 DIAGNOSIS — Z9189 Other specified personal risk factors, not elsewhere classified: Secondary | ICD-10-CM

## 2018-09-07 MED ORDER — VITAMIN D (ERGOCALCIFEROL) 1.25 MG (50000 UNIT) PO CAPS: ORAL_CAPSULE | ORAL | 0 refills | Status: DC

## 2018-09-07 MED FILL — VIT D2 1.25 MG (50,000 UNIT: 1.25 MG | 28 days supply | Qty: 4 | Fill #0

## 2018-09-08 MED FILL — DEXILANT DR 60 MG CAPSULE: 60 | 30 days supply | Qty: 30 | Fill #0

## 2018-09-11 MED FILL — VALSARTAN 320 MG TAB: 320 | 30 days supply | Qty: 30 | Fill #0

## 2018-09-11 NOTE — Progress Notes (Signed)
Office: [phone]  /  Fax: [phone]   HPI:   Chief Complaint: OBESITY Rhonda Aguilar is here to discuss her progress with her obesity treatment plan. She is on the  follow the Category 3 plan and is following her eating plan approximately 60% of the time. She states she is exercising 0 minutes 0 times per week. Natsuko reports that she continues to have sweet cravings. She has been drinking Coke Zero, which has caused her reflux. Her weight is 229 lb (103.9 kg) today and has had a weight gain of 8 lbs since her last in-office visit 4 months ago. She has lost 2 lbs since starting treatment with [name].  Vitamin D deficiency Rhonda Aguilar has a diagnosis of Vitamin D deficiency. She is currently taking prescription Vit D weekly and denies nausea, vomiting or muscle weakness.  At risk for osteopenia and osteoporosis Rhonda Aguilar is at higher risk of osteopenia and osteoporosis due to Vitamin D deficiency.   Diabetes II Rhonda Aguilar has a diagnosis of diabetes type II and is on Jardiance. Rhonda Aguilar states fasting blood sugars range between 125 and 133. She denies any hypoglycemic episodes. Last A1c was reported to be 6.2 in 01/2018. She has been working on intensive lifestyle modifications including diet, exercise, and weight loss to help control her blood glucose levels. No nausea, vomiting, or diarrhea. No UTI's.  ASSESSMENT AND PLAN:  Vitamin D deficiency - Plan: Vitamin D, Ergocalciferol, (DRISDOL) 1.25 MG (50000 UT) CAPS capsule  Type 2 diabetes mellitus without complication, without long-term current use of insulin ([name])  At risk for osteoporosis  Class 3 severe obesity with serious comorbidity and body mass index (BMI) of 40.0 to 44.9 in adult, unspecified obesity type ([place])  PLAN:  Vitamin D Deficiency Rhonda Aguilar was informed that low Vitamin D levels contributes to fatigue and are associated with obesity, breast, and colon cancer. She agrees to continue to take prescription Vit D @ 50,000 IU every week #4 with 0  refills and will follow-up for routine testing of Vitamin D, at least 2-3 times per year. She was informed of the risk of over-replacement of Vitamin D and agrees to not increase her dose unless she discusses this with [name] first. Soley agrees to follow-up with our clinic in 2-3 weeks.  At risk for osteopenia and osteoporosis Darwin was given extended  (15 minutes) osteoporosis prevention counseling today. Rhonda Aguilar is at risk for osteopenia and osteoporsis due to her Vitamin D deficiency. She was encouraged to take her Vitamin D and follow her higher calcium diet and increase strengthening exercise to help strengthen her bones and decrease her risk of osteopenia and osteoporosis.  Diabetes II Rhonda Aguilar has been given extensive diabetes education by myself today including ideal fasting and post-prandial blood glucose readings, individual ideal HgA1c goals  and hypoglycemia prevention. We discussed the importance of good blood sugar control to decrease the likelihood of diabetic complications such as nephropathy, neuropathy, limb loss, blindness, coronary artery disease, and death. We discussed the importance of intensive lifestyle modification including diet, exercise and weight loss as the first line treatment for diabetes. Rhonda Aguilar agrees to continue [place] and will follow-up at the agreed upon time.  Obesity Rhonda Aguilar is currently in the action stage of change. As such, her goal is to continue with weight loss efforts. She has agreed to follow the Category 3 plan. Rhonda Aguilar has been instructed to work up to a goal of 150 minutes of combined cardio and strengthening exercise per week for weight loss and overall health  benefits. We discussed the following Behavioral Modification Strategies today: decreasing simple carbohydrates, work on meal planning and easy cooking plans.  Rhonda Aguilar has agreed to follow-up with our clinic in 2-3 weeks. She was informed of the importance of frequent follow-up visits to maximize her  success with intensive lifestyle modifications for her multiple health conditions.  ALLERGIES: Allergies  Allergen Reactions   Codeine Itching and Rash   Penicillins Rash    MEDICATIONS: Current Outpatient Medications on File Prior to Visit  Medication Sig Dispense Refill   ALPRAZolam (XANAX) 0.5 MG tablet Take 0.25-1 mg by mouth every 8 (eight) hours as needed for anxiety.     busPIRone (BUSPAR) 5 MG tablet Take 1 tablet (5 mg total) by mouth daily. 90 tablet 3   cetirizine (ZYRTEC) 10 MG tablet Take 10 mg by mouth daily.     cyclobenzaprine (FLEXERIL) 10 MG tablet Take 10 mg by mouth 3 (three) times daily as needed for muscle spasms.     dexlansoprazole (DEXILANT) 60 MG capsule Take 60 mg by mouth daily.     empagliflozin (JARDIANCE) 10 MG TABS tablet Take 10 mg by mouth daily. 30 tablet 0   ibuprofen (ADVIL,MOTRIN) 600 MG tablet Take 600 mg by mouth every 6 (six) hours as needed for mild pain.     Peppermint Oil (IBGARD PO) Take by mouth.     Probiotic Product (ALIGN) 4 MG CAPS Take by mouth.     promethazine (PHENERGAN) 12.5 MG tablet Take 1 tablet (12.5 mg total) by mouth every 8 (eight) hours as needed for nausea, vomiting or refractory nausea / vomiting. 60 tablet 3   valsartan (DIOVAN) 320 MG tablet Take 320 mg by mouth daily.      Verapamil HCl CR 300 MG CP24 Take 300 mg by mouth at bedtime.      No current facility-administered medications on file prior to visit.     PAST MEDICAL HISTORY: Past Medical History:  Diagnosis Date   Benign essential HTN 11/22/2014   Diabetes mellitus, type II ([name])    Fatty liver    GERD (gastroesophageal reflux disease)    Hypertension    IBS (irritable bowel syndrome)    Migraine    Obesity (BMI 30-39.9) 11/22/2014   OSA (obstructive sleep apnea)     PAST SURGICAL HISTORY: Past Surgical History:  Procedure Laterality Date   COLONOSCOPY     TONSILLECTOMY      SOCIAL HISTORY: Social History   Tobacco Use    Smoking status: Never Smoker   Smokeless tobacco: Never Used  Substance Use Topics   Alcohol use: Yes    Alcohol/week: 0.0 - 1.0 standard drinks   Drug use: Never    FAMILY HISTORY: Family History  Problem Relation Age of Onset   Diabetes Mother    Heart disease Mother    Hypertension Mother    High Cholesterol Mother    Kidney disease Mother    Depression Mother    Obesity Mother    Coronary artery disease Father    High blood pressure Father    High Cholesterol Father    Depression Father    Hypertension Brother    ROS: Review of Systems  Gastrointestinal: Negative for diarrhea, nausea and vomiting.  Genitourinary:       Negative for UTI's.  Musculoskeletal:       Negative for muscle weakness.   PHYSICAL EXAM: Blood pressure 118/73, pulse 88, temperature 97.7 F (36.5 C), temperature source Oral, height 5\' 2"  (1.575  m), weight 229 lb (103.9 kg), SpO2 95 %. Body mass index is 41.88 kg/m. Physical Exam Vitals signs reviewed.  Constitutional:      Appearance: Normal appearance. She is obese.  Cardiovascular:     Rate and Rhythm: Normal rate.     Pulses: Normal pulses.  Pulmonary:     Effort: Pulmonary effort is normal.     Breath sounds: Normal breath sounds.  Musculoskeletal: Normal range of motion.  Skin:    General: Skin is warm and dry.  Neurological:     Mental Status: She is alert and oriented to person, place, and time.  Psychiatric:        Behavior: Behavior normal.   RECENT LABS AND TESTS: BMET    Component Value Date/Time   NA 138 12/08/2017 1306   K 4.1 12/08/2017 1306   CL 101 12/08/2017 1306   CO2 18 (L) 12/08/2017 1306   GLUCOSE 122 (H) 12/08/2017 1306   BUN 17 12/08/2017 1306   CREATININE 0.73 12/08/2017 1306   CALCIUM 9.2 12/08/2017 1306   GFRNONAA 87 12/08/2017 1306   GFRAA 101 12/08/2017 1306   No results found for: HGBA1C Lab Results  Component Value Date   INSULIN 40.9 (H) 04/12/2018   INSULIN 38.2 (H)  12/08/2017   CBC    Component Value Date/Time   WBC 8.5 12/08/2017 1306   WBC 16.3 (H) 12/19/2014 1120   RBC 4.64 12/08/2017 1306   RBC 4.92 12/19/2014 1120   HGB 12.9 12/08/2017 1306   HCT 38.1 12/08/2017 1306   PLT 263 12/19/2014 1120   MCV 82 12/08/2017 1306   MCH 27.8 12/08/2017 1306   MCH 27.8 12/19/2014 1120   MCHC 33.9 12/08/2017 1306   MCHC 33.5 12/19/2014 1120   RDW 14.0 12/08/2017 1306   LYMPHSABS 2.5 12/08/2017 1306   MONOABS 1.5 (H) 12/19/2014 1120   EOSABS 0.2 12/08/2017 1306   BASOSABS 0.0 12/08/2017 1306   Iron/TIBC/Ferritin/ %Sat No results found for: IRON, TIBC, FERRITIN, IRONPCTSAT Lipid Panel     Component Value Date/Time   CHOL 220 (H) 04/12/2018 1036   TRIG 196 (H) 04/12/2018 1036   HDL 43 04/12/2018 1036   LDLCALC 138 (H) 04/12/2018 1036   Hepatic Function Panel     Component Value Date/Time   PROT 6.7 12/08/2017 1306   ALBUMIN 4.4 12/08/2017 1306   AST 90 (H) 12/08/2017 1306   ALT 118 (H) 12/08/2017 1306   ALKPHOS 86 12/08/2017 1306   BILITOT 0.5 12/08/2017 1306      Component Value Date/Time   TSH 2.550 12/08/2017 1306   Results for LANIKA, COLGATE (MRN [id number]) as of 09/11/2018 07:22  Ref. Range 04/12/2018 10:36  Vitamin D, 25-Hydroxy Latest Ref Range: 30.0 - 100.0 ng/mL 26.7 (L)   OBESITY BEHAVIORAL INTERVENTION VISIT  Today's visit was #16   Starting weight: 231 lbs Starting date: 12/08/2017 Today's weight: 229 lbs  Today's date: 09/07/2018 Total lbs lost to date: 2   09/07/2018  Height 5\' 2"  (1.575 m)  Weight 229 lb (103.9 kg)  BMI (Calculated) 41.87  BLOOD PRESSURE - SYSTOLIC 329  BLOOD PRESSURE - DIASTOLIC 73   Body Fat % 92.4 %  Total Body Water (lbs) 82.4 lbs   ASK: We discussed the diagnosis of obesity with Rhonda Aguilar today and Allura agreed to give [name] permission to discuss obesity behavioral modification therapy today.  ASSESS: Rhonda Aguilar has the diagnosis of obesity and her BMI today is 41.9. Rhonda Aguilar is in the  action stage of change.   ADVISE: Rhonda Aguilar was educated on the multiple health risks of obesity as well as the benefit of weight loss to improve her health. She was advised of the need for long term treatment and the importance of lifestyle modifications to improve her current health and to decrease her risk of future health problems.  AGREE: Multiple dietary modification options and treatment options were discussed and  Rhonda Aguilar agreed to follow the recommendations documented in the above note.  ARRANGE: Rhonda Aguilar was educated on the importance of frequent visits to treat obesity as outlined per CMS and USPSTF guidelines and agreed to schedule her next follow up appointment today.  Migdalia Dk, am acting as transcriptionist for [name], PA-C I, [name], PA-C have reviewed above note and agree with its content

## 2018-09-19 DIAGNOSIS — G4733 Obstructive sleep apnea (adult) (pediatric): Secondary | ICD-10-CM | POA: Diagnosis not present

## 2018-09-20 DIAGNOSIS — E119 Type 2 diabetes mellitus without complications: Secondary | ICD-10-CM | POA: Diagnosis not present

## 2018-09-21 MED FILL — JARDIANCE 10 MG TABLET: 10 | 90 days supply | Qty: 90 | Fill #2

## 2018-09-21 NOTE — Progress Notes (Signed)
I agree with the assessment and plan as directed by NP on this visit . I was available for consultation.   Garrell Flagg, MD  

## 2018-09-27 ENCOUNTER — Ambulatory Visit (INDEPENDENT_AMBULATORY_CARE_PROVIDER_SITE_OTHER): Payer: 59 | Admitting: Physician Assistant

## 2018-09-27 ENCOUNTER — Encounter (INDEPENDENT_AMBULATORY_CARE_PROVIDER_SITE_OTHER): Payer: Self-pay | Admitting: Physician Assistant

## 2018-09-27 ENCOUNTER — Other Ambulatory Visit: Payer: Self-pay

## 2018-09-27 DIAGNOSIS — E559 Vitamin D deficiency, unspecified: Secondary | ICD-10-CM | POA: Diagnosis not present

## 2018-09-27 DIAGNOSIS — E119 Type 2 diabetes mellitus without complications: Secondary | ICD-10-CM | POA: Diagnosis not present

## 2018-09-27 DIAGNOSIS — Z6841 Body Mass Index (BMI) 40.0 and over, adult: Secondary | ICD-10-CM

## 2018-09-27 MED ORDER — VITAMIN D (ERGOCALCIFEROL) 1.25 MG (50000 UNIT) PO CAPS: ORAL_CAPSULE | ORAL | 0 refills | Status: DC

## 2018-09-27 NOTE — Progress Notes (Signed)
Office: [phone]  /  Fax: [phone] TeleHealth Visit:  Rhonda Aguilar has verbally consented to this TeleHealth visit today. The patient is located at home, the provider is located at the [facility] office. The participants in this visit include the listed provider and patient. The visit was conducted today via Webex.  HPI:   Chief Complaint: OBESITY Rhonda Aguilar is here to discuss her progress with her obesity treatment plan. She is on the Category 3 plan and is following her eating plan approximately 50% of the time. She states she is exercising 0 minutes 0 times per week. Rhonda Aguilar reports her most recent weight to be 233 lbs. She is battling a bout of IBS and is not getting enough protein. She is still having cravings and doing some boredom and comfort eating. We were unable to weigh the patient today for this TeleHealth visit. She feels as if she has gained weight since her last visit. She has lost 2 lbs since starting treatment with [name].  Vitamin D deficiency Rhonda Aguilar has a diagnosis of Vitamin D deficiency. She is currently taking prescription Vit D and denies nausea, vomiting or muscle weakness.  Diabetes II without Long-Term Use of Insulin Rhonda Aguilar has a diagnosis of diabetes type II and is on Jardiance. Rhonda Aguilar states her fasting blood sugar this morning was 133. She has been working on intensive lifestyle modifications including diet, exercise, and weight loss to help control her blood glucose levels.  ASSESSMENT AND PLAN:  Vitamin D deficiency - Plan: Vitamin D, Ergocalciferol, (DRISDOL) 1.25 MG (50000 UT) CAPS capsule  Type 2 diabetes mellitus without complication, without long-term current use of insulin ([name])  Class 3 severe obesity with serious comorbidity and body mass index (BMI) of 40.0 to 44.9 in adult, unspecified obesity type ([place])  PLAN:  Vitamin D Deficiency Rhonda Aguilar was informed that low Vitamin D levels contributes to fatigue and are associated with obesity,  breast, and colon cancer. She agrees to continue to take prescription Vit D @ 50,000 IU every week #4 with 0 refills and will follow-up for routine testing of Vitamin D, at least 2-3 times per year. She was informed of the risk of over-replacement of Vitamin D and agrees to not increase her dose unless she discusses this with [name] first. Rhonda Aguilar agrees to follow-up with our clinic in 2 weeks.  Diabetes II without Long-Term Use of Insulin Rhonda Aguilar has been given extensive diabetes education by myself today including ideal fasting and post-prandial blood glucose readings, individual ideal HgA1c goals  and hypoglycemia prevention. We discussed the importance of good blood sugar control to decrease the likelihood of diabetic complications such as nephropathy, neuropathy, limb loss, blindness, coronary artery disease, and death. We discussed the importance of intensive lifestyle modification including diet, exercise and weight loss as the first line treatment for diabetes. Rhonda Aguilar agrees to continue her diabetes medications and will follow-up at the agreed upon time.  Obesity Rhonda Aguilar is currently in the action stage of change. As such, her goal is to continue with weight loss efforts. She has agreed to follow the Category 3 plan. Rhonda Aguilar has been instructed to work up to a goal of 150 minutes of combined cardio and strengthening exercise per week for weight loss and overall health benefits. We discussed the following Behavioral Modification Strategies today: work on meal planning and easy cooking plans, and keeping healthy foods in the home.  Rhonda Aguilar has agreed to follow-up with our clinic in 2 weeks. She was informed of the  importance of frequent follow-up visits to maximize her success with intensive lifestyle modifications for her multiple health conditions.  ALLERGIES: Allergies  Allergen Reactions   Codeine Itching and Rash   Penicillins Rash    MEDICATIONS: Current Outpatient Medications on File Prior to  Visit  Medication Sig Dispense Refill   ALPRAZolam (XANAX) 0.5 MG tablet Take 0.25-1 mg by mouth every 8 (eight) hours as needed for anxiety.     busPIRone (BUSPAR) 5 MG tablet Take 1 tablet (5 mg total) by mouth daily. 90 tablet 3   cetirizine (ZYRTEC) 10 MG tablet Take 10 mg by mouth daily.     cyclobenzaprine (FLEXERIL) 10 MG tablet Take 10 mg by mouth 3 (three) times daily as needed for muscle spasms.     dexlansoprazole (DEXILANT) 60 MG capsule Take 60 mg by mouth daily.     empagliflozin (JARDIANCE) 10 MG TABS tablet Take 10 mg by mouth daily. 30 tablet 0   ibuprofen (ADVIL,MOTRIN) 600 MG tablet Take 600 mg by mouth every 6 (six) hours as needed for mild pain.     Peppermint Oil (IBGARD PO) Take by mouth.     Probiotic Product (ALIGN) 4 MG CAPS Take by mouth.     promethazine (PHENERGAN) 12.5 MG tablet Take 1 tablet (12.5 mg total) by mouth every 8 (eight) hours as needed for nausea, vomiting or refractory nausea / vomiting. 60 tablet 3   valsartan (DIOVAN) 320 MG tablet Take 320 mg by mouth daily.      Verapamil HCl CR 300 MG CP24 Take 300 mg by mouth at bedtime.      No current facility-administered medications on file prior to visit.     PAST MEDICAL HISTORY: Past Medical History:  Diagnosis Date   Benign essential HTN 11/22/2014   Diabetes mellitus, type II ([name])    Fatty liver    GERD (gastroesophageal reflux disease)    Hypertension    IBS (irritable bowel syndrome)    Migraine    Obesity (BMI 30-39.9) 11/22/2014   OSA (obstructive sleep apnea)     PAST SURGICAL HISTORY: Past Surgical History:  Procedure Laterality Date   COLONOSCOPY     TONSILLECTOMY      SOCIAL HISTORY: Social History   Tobacco Use   Smoking status: Never Smoker   Smokeless tobacco: Never Used  Substance Use Topics   Alcohol use: Yes    Alcohol/week: 0.0 - 1.0 standard drinks   Drug use: Never    FAMILY HISTORY: Family History  Problem Relation Age of Onset     Diabetes Mother    Heart disease Mother    Hypertension Mother    High Cholesterol Mother    Kidney disease Mother    Depression Mother    Obesity Mother    Coronary artery disease Father    High blood pressure Father    High Cholesterol Father    Depression Father    Hypertension Brother    ROS: Review of Systems  Gastrointestinal: Negative for nausea and vomiting.  Musculoskeletal:       Negative for muscle weakness.   PHYSICAL EXAM: Pt in no acute distress  RECENT LABS AND TESTS: BMET    Component Value Date/Time   NA 138 12/08/2017 1306   K 4.1 12/08/2017 1306   CL 101 12/08/2017 1306   CO2 18 (L) 12/08/2017 1306   GLUCOSE 122 (H) 12/08/2017 1306   BUN 17 12/08/2017 1306   CREATININE 0.73 12/08/2017 1306   CALCIUM 9.2  12/08/2017 1306   GFRNONAA 87 12/08/2017 1306   GFRAA 101 12/08/2017 1306   No results found for: HGBA1C Lab Results  Component Value Date   INSULIN 40.9 (H) 04/12/2018   INSULIN 38.2 (H) 12/08/2017   CBC    Component Value Date/Time   WBC 8.5 12/08/2017 1306   WBC 16.3 (H) 12/19/2014 1120   RBC 4.64 12/08/2017 1306   RBC 4.92 12/19/2014 1120   HGB 12.9 12/08/2017 1306   HCT 38.1 12/08/2017 1306   PLT 263 12/19/2014 1120   MCV 82 12/08/2017 1306   MCH 27.8 12/08/2017 1306   MCH 27.8 12/19/2014 1120   MCHC 33.9 12/08/2017 1306   MCHC 33.5 12/19/2014 1120   RDW 14.0 12/08/2017 1306   LYMPHSABS 2.5 12/08/2017 1306   MONOABS 1.5 (H) 12/19/2014 1120   EOSABS 0.2 12/08/2017 1306   BASOSABS 0.0 12/08/2017 1306   Iron/TIBC/Ferritin/ %Sat No results found for: IRON, TIBC, FERRITIN, IRONPCTSAT Lipid Panel     Component Value Date/Time   CHOL 220 (H) 04/12/2018 1036   TRIG 196 (H) 04/12/2018 1036   HDL 43 04/12/2018 1036   LDLCALC 138 (H) 04/12/2018 1036   Hepatic Function Panel     Component Value Date/Time   PROT 6.7 12/08/2017 1306   ALBUMIN 4.4 12/08/2017 1306   AST 90 (H) 12/08/2017 1306   ALT 118 (H)  12/08/2017 1306   ALKPHOS 86 12/08/2017 1306   BILITOT 0.5 12/08/2017 1306      Component Value Date/Time   TSH 2.550 12/08/2017 1306   Results for GRETCHEN, WEINFELD (MRN [id number]) as of 09/27/2018 11:37  Ref. Range 04/12/2018 10:36  Vitamin D, 25-Hydroxy Latest Ref Range: 30.0 - 100.0 ng/mL 26.7 (L)    I, Michaelene Song, am acting as Location manager for [employer], PA-C I, [name], PA-C have reviewed above note and agree with its content

## 2018-10-11 ENCOUNTER — Ambulatory Visit (INDEPENDENT_AMBULATORY_CARE_PROVIDER_SITE_OTHER): Payer: 59 | Admitting: Physician Assistant

## 2018-10-12 ENCOUNTER — Other Ambulatory Visit: Payer: Self-pay

## 2018-10-12 ENCOUNTER — Ambulatory Visit (INDEPENDENT_AMBULATORY_CARE_PROVIDER_SITE_OTHER): Payer: 59 | Admitting: Physician Assistant

## 2018-10-12 VITALS — BP 108/73 | HR 77 | Temp 97.9°F | Ht 62.0 in | Wt 233.0 lb

## 2018-10-12 DIAGNOSIS — E119 Type 2 diabetes mellitus without complications: Secondary | ICD-10-CM | POA: Diagnosis not present

## 2018-10-12 DIAGNOSIS — Z6841 Body Mass Index (BMI) 40.0 and over, adult: Secondary | ICD-10-CM

## 2018-10-12 MED FILL — VALSARTAN 320 MG TAB: 320 | 30 days supply | Qty: 30 | Fill #1

## 2018-10-12 MED FILL — DEXILANT DR 60 MG CAPSULE: 60 | 30 days supply | Qty: 30 | Fill #1

## 2018-10-12 MED FILL — VIT D2 1.25 MG (50,000 UNIT: 1.25 MG | 28 days supply | Qty: 4 | Fill #0

## 2018-10-12 NOTE — Progress Notes (Signed)
Office: [phone]  /  Fax: [phone]   HPI:   Chief Complaint: OBESITY Rhonda Aguilar is here to discuss her progress with her obesity treatment plan. She is on the  follow the Category 3 plan and is following her eating plan approximately 50 % of the time. She states she is exercising 0 minutes 0 times per week. Rhonda Aguilar reports that she has lost motivation to stay on plan recently. She is eating more carbohydrates and drinking more diet sodas.   Her weight is 233 lb (105.7 kg) today and has had a weight gain of 4 pounds over a period of 4 weeks since her last in office visit. She has lost 0 lbs since starting treatment with [name].  Diabetes II Rhonda Aguilar has a diagnosis of diabetes type II. Rhonda Aguilar states fasting BGs range between 114 and 138 and denies any hypoglycemic episodes. She has been working on intensive lifestyle modifications including diet, exercise, and weight loss to help control her blood glucose levels. She is on Jardiance.     ASSESSMENT AND PLAN:  Type 2 diabetes mellitus without complication, without long-term current use of insulin ([name])  Class 3 severe obesity with serious comorbidity and body mass index (BMI) of 40.0 to 44.9 in adult, unspecified obesity type ([place])  PLAN: Diabetes II Rhonda Aguilar has been given extensive diabetes education by myself today including ideal fasting and post-prandial blood glucose readings, individual ideal HgA1c goals  and hypoglycemia prevention. We discussed the importance of good blood sugar control to decrease the likelihood of diabetic complications such as nephropathy, neuropathy, limb loss, blindness, coronary artery disease, and death. We discussed the importance of intensive lifestyle modification including diet, exercise and weight loss as the first line treatment for diabetes. Rhonda Aguilar agrees to continue her diabetes medications and will follow up at the agreed upon time.  I spent > than 50% of the 25 minute visit on counseling as documented in the  note.   Obesity Rhonda Aguilar is currently in the action stage of change. As such, her goal is to continue with weight loss efforts She has agreed to keep a food journal with 1500 calories and 95g of protein daily Rhonda Aguilar has been instructed to work up to a goal of 150 minutes of combined cardio and strengthening exercise per week for weight loss and overall health benefits. We discussed the following Behavioral Modification Strategies today: increasing lean protein intake and decreasing simple carbohydrates    Rhonda Aguilar has agreed to follow up with our clinic in 2 weeks. She was informed of the importance of frequent follow up visits to maximize her success with intensive lifestyle modifications for her multiple health conditions.  I spent > than 50% of the 25 minute visit on counseling as documented in the note.    ALLERGIES: Allergies  Allergen Reactions  . Codeine Itching and Rash  . Penicillins Rash    MEDICATIONS: Current Outpatient Medications on File Prior to Visit  Medication Sig Dispense Refill  . ALPRAZolam (XANAX) 0.5 MG tablet Take 0.25-1 mg by mouth every 8 (eight) hours as needed for anxiety.    . busPIRone (BUSPAR) 5 MG tablet Take 1 tablet (5 mg total) by mouth daily. 90 tablet 3  . cetirizine (ZYRTEC) 10 MG tablet Take 10 mg by mouth daily.    . cyclobenzaprine (FLEXERIL) 10 MG tablet Take 10 mg by mouth 3 (three) times daily as needed for muscle spasms.    [name] dexlansoprazole (DEXILANT) 60 MG capsule Take 60 mg by mouth daily.    [name]  empagliflozin (JARDIANCE) 10 MG TABS tablet Take 10 mg by mouth daily. 30 tablet 0  . ibuprofen (ADVIL,MOTRIN) 600 MG tablet Take 600 mg by mouth every 6 (six) hours as needed for mild pain.    [name] Peppermint Oil (IBGARD PO) Take by mouth.    . Probiotic Product (ALIGN) 4 MG CAPS Take by mouth.    . promethazine (PHENERGAN) 12.5 MG tablet Take 1 tablet (12.5 mg total) by mouth every 8 (eight) hours as needed for nausea, vomiting or refractory nausea /  vomiting. 60 tablet 3  . valsartan (DIOVAN) 320 MG tablet Take 320 mg by mouth daily.     . Verapamil HCl CR 300 MG CP24 Take 300 mg by mouth at bedtime.     . Vitamin D, Ergocalciferol, (DRISDOL) 1.25 MG (50000 UT) CAPS capsule TAKE 1 CAPSULE BY MOUTH EVERY 7 DAYS 4 capsule 0   No current facility-administered medications on file prior to visit.     PAST MEDICAL HISTORY: Past Medical History:  Diagnosis Date  . Benign essential HTN 11/22/2014  . Diabetes mellitus, type II ([place])   . Fatty liver   . GERD (gastroesophageal reflux disease)   . Hypertension   . IBS (irritable bowel syndrome)   . Migraine   . Obesity (BMI 30-39.9) 11/22/2014  . OSA (obstructive sleep apnea)     PAST SURGICAL HISTORY: Past Surgical History:  Procedure Laterality Date  . COLONOSCOPY    . TONSILLECTOMY      SOCIAL HISTORY: Social History   Tobacco Use  . Smoking status: Never Smoker  . Smokeless tobacco: Never Used  Substance Use Topics  . Alcohol use: Yes    Alcohol/week: 0.0 - 1.0 standard drinks  . Drug use: Never    FAMILY HISTORY: Family History  Problem Relation Age of Onset  . Diabetes Mother   . Heart disease Mother   . Hypertension Mother   . High Cholesterol Mother   . Kidney disease Mother   . Depression Mother   . Obesity Mother   . Coronary artery disease Father   . High blood pressure Father   . High Cholesterol Father   . Depression Father   . Hypertension Brother     ROS: Review of Systems  Constitutional: Negative for weight loss.  Endo/Heme/Allergies:       Negative for hypoglycemia     PHYSICAL EXAM: Blood pressure 108/73, pulse 77, temperature 97.9 F (36.6 C), temperature source Oral, height 5\' 2"  (1.575 m), weight 233 lb (105.7 kg), SpO2 95 %. Body mass index is 42.62 kg/m. Physical Exam Vitals signs reviewed.  Constitutional:      Appearance: Normal appearance. She is normal weight.  HENT:     Head: Normocephalic.     Nose: Nose normal.  Neck:      Musculoskeletal: Normal range of motion.  Cardiovascular:     Rate and Rhythm: Normal rate.  Pulmonary:     Effort: Pulmonary effort is normal.  Musculoskeletal: Normal range of motion.  Skin:    General: Skin is warm and dry.  Neurological:     Mental Status: She is alert and oriented to person, place, and time.  Psychiatric:        Mood and Affect: Mood normal.        Behavior: Behavior normal.     RECENT LABS AND TESTS: BMET    Component Value Date/Time   NA 138 12/08/2017 1306   K 4.1 12/08/2017 1306   CL 101  12/08/2017 1306   CO2 18 (L) 12/08/2017 1306   GLUCOSE 122 (H) 12/08/2017 1306   BUN 17 12/08/2017 1306   CREATININE 0.73 12/08/2017 1306   CALCIUM 9.2 12/08/2017 1306   GFRNONAA 87 12/08/2017 1306   GFRAA 101 12/08/2017 1306   No results found for: HGBA1C Lab Results  Component Value Date   INSULIN 40.9 (H) 04/12/2018   INSULIN 38.2 (H) 12/08/2017   CBC    Component Value Date/Time   WBC 8.5 12/08/2017 1306   WBC 16.3 (H) 12/19/2014 1120   RBC 4.64 12/08/2017 1306   RBC 4.92 12/19/2014 1120   HGB 12.9 12/08/2017 1306   HCT 38.1 12/08/2017 1306   PLT 263 12/19/2014 1120   MCV 82 12/08/2017 1306   MCH 27.8 12/08/2017 1306   MCH 27.8 12/19/2014 1120   MCHC 33.9 12/08/2017 1306   MCHC 33.5 12/19/2014 1120   RDW 14.0 12/08/2017 1306   LYMPHSABS 2.5 12/08/2017 1306   MONOABS 1.5 (H) 12/19/2014 1120   EOSABS 0.2 12/08/2017 1306   BASOSABS 0.0 12/08/2017 1306   Iron/TIBC/Ferritin/ %Sat No results found for: IRON, TIBC, FERRITIN, IRONPCTSAT Lipid Panel     Component Value Date/Time   CHOL 220 (H) 04/12/2018 1036   TRIG 196 (H) 04/12/2018 1036   HDL 43 04/12/2018 1036   LDLCALC 138 (H) 04/12/2018 1036   Hepatic Function Panel     Component Value Date/Time   PROT 6.7 12/08/2017 1306   ALBUMIN 4.4 12/08/2017 1306   AST 90 (H) 12/08/2017 1306   ALT 118 (H) 12/08/2017 1306   ALKPHOS 86 12/08/2017 1306   BILITOT 0.5 12/08/2017 1306       Component Value Date/Time   TSH 2.550 12/08/2017 1306      OBESITY BEHAVIORAL INTERVENTION VISIT  Today's visit was # 18   Starting weight: 231 lbs Starting date: 12/08/17 Today's weight : Weight: 233 lb (105.7 kg)  Today's date: 10/12/2018 Total lbs lost to date: 0 At least 15 minutes were spent on discussing the following behavioral intervention visit.   ASK: We discussed the diagnosis of obesity with Rhonda Aguilar today and Rhonda Aguilar agreed to give [name] permission to discuss obesity behavioral modification therapy today.  ASSESS: Brittyn has the diagnosis of obesity and her BMI today is 42.61 Rhonda Aguilar is in the action stage of change   ADVISE: Rhonda Aguilar was educated on the multiple health risks of obesity as well as the benefit of weight loss to improve her health. She was advised of the need for long term treatment and the importance of lifestyle modifications to improve her current health and to decrease her risk of future health problems.  AGREE: Multiple dietary modification options and treatment options were discussed and  Rhonda Aguilar agreed to follow the recommendations documented in the above note.  ARRANGE: Rhonda Aguilar was educated on the importance of frequent visits to treat obesity as outlined per CMS and USPSTF guidelines and agreed to schedule her next follow up appointment today.  Leary Roca, am acting as transcriptionist for [name], PA-C  I, [name], PA-C have reviewed above note and agree with its content

## 2018-10-18 DIAGNOSIS — E782 Mixed hyperlipidemia: Secondary | ICD-10-CM | POA: Diagnosis not present

## 2018-10-18 DIAGNOSIS — I10 Essential (primary) hypertension: Secondary | ICD-10-CM | POA: Diagnosis not present

## 2018-10-18 DIAGNOSIS — F329 Major depressive disorder, single episode, unspecified: Secondary | ICD-10-CM | POA: Diagnosis not present

## 2018-10-18 DIAGNOSIS — K219 Gastro-esophageal reflux disease without esophagitis: Secondary | ICD-10-CM | POA: Diagnosis not present

## 2018-10-18 DIAGNOSIS — E1165 Type 2 diabetes mellitus with hyperglycemia: Secondary | ICD-10-CM | POA: Diagnosis not present

## 2018-10-18 MED FILL — PRAVASTATIN SODIUM 10 MG TA: 10 | 90 days supply | Qty: 90 | Fill #0

## 2018-10-18 MED FILL — busPIRone HCL 5 MG TABS: 5 | 90 days supply | Qty: 90 | Fill #1

## 2018-10-20 DIAGNOSIS — G4733 Obstructive sleep apnea (adult) (pediatric): Secondary | ICD-10-CM | POA: Diagnosis not present

## 2018-10-25 ENCOUNTER — Ambulatory Visit (INDEPENDENT_AMBULATORY_CARE_PROVIDER_SITE_OTHER): Payer: 59 | Admitting: Family Medicine

## 2018-10-25 ENCOUNTER — Ambulatory Visit
Admission: RE | Admit: 2018-10-25 | Discharge: 2018-10-25 | Disposition: A | Discharge status: Home / Self Care | Payer: 59 | Source: Ambulatory Visit | Attending: Obstetrics and Gynecology | Admitting: Obstetrics and Gynecology

## 2018-10-25 ENCOUNTER — Other Ambulatory Visit: Payer: Self-pay

## 2018-10-25 DIAGNOSIS — Z1231 Encounter for screening mammogram for malignant neoplasm of breast: Secondary | ICD-10-CM | POA: Diagnosis not present

## 2018-10-29 ENCOUNTER — Encounter (INDEPENDENT_AMBULATORY_CARE_PROVIDER_SITE_OTHER): Payer: Self-pay | Admitting: Family Medicine

## 2018-11-01 ENCOUNTER — Ambulatory Visit (INDEPENDENT_AMBULATORY_CARE_PROVIDER_SITE_OTHER): Payer: 59 | Admitting: Family Medicine

## 2018-11-06 DIAGNOSIS — G4733 Obstructive sleep apnea (adult) (pediatric): Secondary | ICD-10-CM | POA: Diagnosis not present

## 2018-11-06 DIAGNOSIS — G4730 Sleep apnea, unspecified: Secondary | ICD-10-CM | POA: Diagnosis not present

## 2018-11-07 ENCOUNTER — Other Ambulatory Visit: Payer: Self-pay

## 2018-11-07 ENCOUNTER — Encounter (INDEPENDENT_AMBULATORY_CARE_PROVIDER_SITE_OTHER): Payer: Self-pay | Admitting: Family Medicine

## 2018-11-07 ENCOUNTER — Telehealth (INDEPENDENT_AMBULATORY_CARE_PROVIDER_SITE_OTHER): Payer: 59 | Admitting: Family Medicine

## 2018-11-07 ENCOUNTER — Encounter (INDEPENDENT_AMBULATORY_CARE_PROVIDER_SITE_OTHER): Payer: Self-pay

## 2018-11-07 DIAGNOSIS — Z86010 Personal history of colonic polyps: Secondary | ICD-10-CM | POA: Diagnosis not present

## 2018-11-07 DIAGNOSIS — F3289 Other specified depressive episodes: Secondary | ICD-10-CM

## 2018-11-07 DIAGNOSIS — E119 Type 2 diabetes mellitus without complications: Secondary | ICD-10-CM

## 2018-11-07 DIAGNOSIS — Z1211 Encounter for screening for malignant neoplasm of colon: Secondary | ICD-10-CM | POA: Diagnosis not present

## 2018-11-07 DIAGNOSIS — Z6841 Body Mass Index (BMI) 40.0 and over, adult: Secondary | ICD-10-CM | POA: Diagnosis not present

## 2018-11-07 DIAGNOSIS — K219 Gastro-esophageal reflux disease without esophagitis: Secondary | ICD-10-CM | POA: Diagnosis not present

## 2018-11-07 DIAGNOSIS — R1033 Periumbilical pain: Secondary | ICD-10-CM | POA: Diagnosis not present

## 2018-11-07 DIAGNOSIS — E559 Vitamin D deficiency, unspecified: Secondary | ICD-10-CM | POA: Diagnosis not present

## 2018-11-07 MED ORDER — VITAMIN D (ERGOCALCIFEROL) 1.25 MG (50000 UNIT) PO CAPS: ORAL_CAPSULE | ORAL | 0 refills | Status: DC

## 2018-11-07 MED FILL — VIT D2 1.25 MG (50,000 UNIT: 1.25 MG | 28 days supply | Qty: 4 | Fill #0

## 2018-11-07 MED FILL — DEXILANT DR 60 MG CAPSULE: 60 | 30 days supply | Qty: 30 | Fill #0

## 2018-11-07 MED FILL — PEG-3350 AND ELECTROLYTES S: 236 | 2 days supply | Qty: 4000 | Fill #0

## 2018-11-08 NOTE — Progress Notes (Signed)
Office: [phone]  /  Fax: [phone] TeleHealth Visit:  Rhonda Aguilar has verbally consented to this TeleHealth visit today. The patient is located at home, the provider is located at the [facility] office. The participants in this visit include the listed provider and patient and any and all parties involved. The visit was conducted today via WebEx.  HPI:   Chief Complaint: OBESITY Rhonda Aguilar is here to discuss her progress with her obesity treatment plan. She is on the Category 3 plan and is following her eating plan approximately 30 % of the time. She states she is exercising 0 minutes 0 times per week. Rhonda Aguilar has gained 2 pounds and her weight today is 235 pounds. She was off her plan over the weekend but she did very well last week. Rhonda Aguilar does not do well with journaling. She tends to stick to plan well at lunch but not so great at dinner. We were unable to weigh the patient today for this TeleHealth visit. She feels as if she has gained weight since her last visit. She has lost 0 lbs since starting treatment with [name].  Vitamin D deficiency Rhonda Aguilar has a diagnosis of vitamin D deficiency. Rhonda Aguilar is currently taking vit D and she is not at goal. Her last vitamin D level was at 26.7 on 04/12/18. She denies nausea, vomiting or muscle weakness.  Diabetes II Rhonda Aguilar has a diagnosis of diabetes type II which is well controlled with Jardiance. Rhonda Aguilar states fasting BGs run in the 130's in the morning. Her last A1c was at 6.7, the first of September 2020 at [facility]. She is on Jardiance only. Metformin causes migraines. She has been working on intensive lifestyle modifications including diet, exercise, and weight loss to help control her blood glucose levels. Rhonda Aguilar denies polyphagia.  Depression with emotional eating behaviors Rhonda Aguilar does feel she eats for reasons other than hunger like boredom. She is willing to see Rhonda Aguilar. She feels she knows the reasons she should eat in a healthy  manner, but she struggles to put this into practice. Rhonda Aguilar denies depression, but she reports a lack of energy. Rhonda Aguilar struggles with emotional eating and using food for comfort to the extent that it is negatively impacting her health. She often snacks when she is not hungry. Rhonda Aguilar sometimes feels she is out of control and then feels guilty that she made poor food choices. She has been working on behavior modification techniques to help reduce her emotional eating and has been somewhat successful. She shows no sign of suicidal or homicidal ideations.  ASSESSMENT AND PLAN:  Vitamin D deficiency - Plan: Vitamin D, Ergocalciferol, (DRISDOL) 1.25 MG (50000 UT) CAPS capsule  Type 2 diabetes mellitus without complication, without long-term current use of insulin ([name])  Other depression - with emotional eating  Class 3 severe obesity with serious comorbidity and body mass index (BMI) of 40.0 to 44.9 in adult, unspecified obesity type ([place])  PLAN:  Vitamin D Deficiency Rhonda Aguilar was informed that low vitamin D levels contributes to fatigue and are associated with obesity, breast, and colon cancer. She agrees to continue to take prescription Vit D @50 ,000 IU every week #4 with no refills and she will follow up for routine testing of vitamin D, at least 2-3 times per year. She was informed of the risk of over-replacement of vitamin D and agrees to not increase her dose unless she discusses this with [name] first. We will recheck vitamin D level in 2 weeks. Rhonda Aguilar agrees  to follow up as directed.  Diabetes II Rhonda Aguilar has been given extensive diabetes education by myself today including ideal fasting and post-prandial blood glucose readings, individual ideal Hgb A1c goals and hypoglycemia prevention. We discussed the importance of good blood sugar control to decrease the likelihood of diabetic complications such as nephropathy, neuropathy, limb loss, blindness, coronary artery disease, and death. We discussed the  importance of intensive lifestyle modification including diet, exercise and weight loss as the first line treatment for diabetes. Rhonda Aguilar agrees to continue [place] and she will follow up at the agreed upon time.  Depression with Emotional Eating Behaviors We discussed behavior modification techniques today to help Rhonda Aguilar deal with her emotional eating and depression. We will refer patient to Rhonda Aguilar (bariatric psychologist) and she will follow up as directed.  Obesity Rhonda Aguilar is currently in the action stage of change. As such, her goal is to continue with weight loss efforts She has agreed to follow the Category 3 plan Rhonda Aguilar has been instructed to work up to a goal of 150 minutes of combined cardio and strengthening exercise per week for weight loss and overall health benefits. We discussed the following Behavioral Modification Strategies today: increase H2O intake, keeping healthy foods in the home and better snacking choices  Rhonda Aguilar has agreed to follow up with our clinic in 2 weeks. She was informed of the importance of frequent follow up visits to maximize her success with intensive lifestyle modifications for her multiple health conditions.  ALLERGIES: Allergies  Allergen Reactions  . Codeine Itching and Rash  . Penicillins Rash    MEDICATIONS: Current Outpatient Medications on File Prior to Visit  Medication Sig Dispense Refill  . ALPRAZolam (XANAX) 0.5 MG tablet Take 0.25-1 mg by mouth every 8 (eight) hours as needed for anxiety.    . busPIRone (BUSPAR) 5 MG tablet Take 1 tablet (5 mg total) by mouth daily. 90 tablet 3  . cetirizine (ZYRTEC) 10 MG tablet Take 10 mg by mouth daily.    . cyclobenzaprine (FLEXERIL) 10 MG tablet Take 10 mg by mouth 3 (three) times daily as needed for muscle spasms.    [name] dexlansoprazole (DEXILANT) 60 MG capsule Take 60 mg by mouth daily.    . empagliflozin (JARDIANCE) 10 MG TABS tablet Take 10 mg by mouth daily. 30 tablet 0  . ibuprofen  (ADVIL,MOTRIN) 600 MG tablet Take 600 mg by mouth every 6 (six) hours as needed for mild pain.    [name] Peppermint Oil (IBGARD PO) Take by mouth.    . pravastatin (PRAVACHOL) 10 MG tablet Take 10 mg by mouth daily.    . Probiotic Product (ALIGN) 4 MG CAPS Take by mouth.    . promethazine (PHENERGAN) 12.5 MG tablet Take 1 tablet (12.5 mg total) by mouth every 8 (eight) hours as needed for nausea, vomiting or refractory nausea / vomiting. 60 tablet 3  . valsartan (DIOVAN) 320 MG tablet Take 320 mg by mouth daily.     . Verapamil HCl CR 300 MG CP24 Take 300 mg by mouth at bedtime.      No current facility-administered medications on file prior to visit.     PAST MEDICAL HISTORY: Past Medical History:  Diagnosis Date  . Benign essential HTN 11/22/2014  . Diabetes mellitus, type II ([place])   . Fatty liver   . GERD (gastroesophageal reflux disease)   . Hypertension   . IBS (irritable bowel syndrome)   . Migraine   . Obesity (BMI 30-39.9) 11/22/2014  .  OSA (obstructive sleep apnea)     PAST SURGICAL HISTORY: Past Surgical History:  Procedure Laterality Date  . COLONOSCOPY    . TONSILLECTOMY      SOCIAL HISTORY: Social History   Tobacco Use  . Smoking status: Never Smoker  . Smokeless tobacco: Never Used  Substance Use Topics  . Alcohol use: Yes    Alcohol/week: 0.0 - 1.0 standard drinks  . Drug use: Never    FAMILY HISTORY: Family History  Problem Relation Age of Onset  . Diabetes Mother   . Heart disease Mother   . Hypertension Mother   . High Cholesterol Mother   . Kidney disease Mother   . Depression Mother   . Obesity Mother   . Coronary artery disease Father   . High blood pressure Father   . High Cholesterol Father   . Depression Father   . Hypertension Brother     ROS: Review of Systems  Constitutional: Negative for weight loss.  Gastrointestinal: Negative for nausea and vomiting.  Musculoskeletal:       Negative for muscle weakness  Endo/Heme/Allergies:        Negative for polyphagia  Psychiatric/Behavioral: Positive for depression. Negative for suicidal ideas.    PHYSICAL EXAM: Pt in no acute distress  RECENT LABS AND TESTS: BMET    Component Value Date/Time   NA 138 12/08/2017 1306   K 4.1 12/08/2017 1306   CL 101 12/08/2017 1306   CO2 18 (L) 12/08/2017 1306   GLUCOSE 122 (H) 12/08/2017 1306   BUN 17 12/08/2017 1306   CREATININE 0.73 12/08/2017 1306   CALCIUM 9.2 12/08/2017 1306   GFRNONAA 87 12/08/2017 1306   GFRAA 101 12/08/2017 1306   No results found for: HGBA1C Lab Results  Component Value Date   INSULIN 40.9 (H) 04/12/2018   INSULIN 38.2 (H) 12/08/2017   CBC    Component Value Date/Time   WBC 8.5 12/08/2017 1306   WBC 16.3 (H) 12/19/2014 1120   RBC 4.64 12/08/2017 1306   RBC 4.92 12/19/2014 1120   HGB 12.9 12/08/2017 1306   HCT 38.1 12/08/2017 1306   PLT 263 12/19/2014 1120   MCV 82 12/08/2017 1306   MCH 27.8 12/08/2017 1306   MCH 27.8 12/19/2014 1120   MCHC 33.9 12/08/2017 1306   MCHC 33.5 12/19/2014 1120   RDW 14.0 12/08/2017 1306   LYMPHSABS 2.5 12/08/2017 1306   MONOABS 1.5 (H) 12/19/2014 1120   EOSABS 0.2 12/08/2017 1306   BASOSABS 0.0 12/08/2017 1306   Iron/TIBC/Ferritin/ %Sat No results found for: IRON, TIBC, FERRITIN, IRONPCTSAT Lipid Panel     Component Value Date/Time   CHOL 220 (H) 04/12/2018 1036   TRIG 196 (H) 04/12/2018 1036   HDL 43 04/12/2018 1036   LDLCALC 138 (H) 04/12/2018 1036   Hepatic Function Panel     Component Value Date/Time   PROT 6.7 12/08/2017 1306   ALBUMIN 4.4 12/08/2017 1306   AST 90 (H) 12/08/2017 1306   ALT 118 (H) 12/08/2017 1306   ALKPHOS 86 12/08/2017 1306   BILITOT 0.5 12/08/2017 1306      Component Value Date/Time   TSH 2.550 12/08/2017 1306     Ref. Range 04/12/2018 10:36  Vitamin D, 25-Hydroxy Latest Ref Range: 30.0 - 100.0 ng/mL 26.7 (L)    I, Doreene Nest, am acting as Location manager for [employer], [name]  I have reviewed the above  documentation for accuracy and completeness, and I agree with the above.  - Marijane Trower, [name].
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
## 2018-11-10 MED FILL — VALSARTAN 320 MG TAB: 320 | 30 days supply | Qty: 30 | Fill #2

## 2018-11-15 ENCOUNTER — Ambulatory Visit (INDEPENDENT_AMBULATORY_CARE_PROVIDER_SITE_OTHER): Payer: 59 | Admitting: Psychology

## 2018-11-15 ENCOUNTER — Other Ambulatory Visit: Payer: Self-pay

## 2018-11-15 DIAGNOSIS — F3289 Other specified depressive episodes: Secondary | ICD-10-CM

## 2018-11-20 ENCOUNTER — Ambulatory Visit (INDEPENDENT_AMBULATORY_CARE_PROVIDER_SITE_OTHER): Payer: 59 | Admitting: Family Medicine

## 2018-11-20 DIAGNOSIS — G4733 Obstructive sleep apnea (adult) (pediatric): Secondary | ICD-10-CM | POA: Diagnosis not present

## 2018-11-23 ENCOUNTER — Telehealth (INDEPENDENT_AMBULATORY_CARE_PROVIDER_SITE_OTHER): Payer: 59 | Admitting: Physician Assistant

## 2018-11-23 ENCOUNTER — Other Ambulatory Visit: Payer: Self-pay

## 2018-11-23 ENCOUNTER — Encounter (INDEPENDENT_AMBULATORY_CARE_PROVIDER_SITE_OTHER): Payer: Self-pay | Admitting: Physician Assistant

## 2018-11-23 DIAGNOSIS — Z6841 Body Mass Index (BMI) 40.0 and over, adult: Secondary | ICD-10-CM

## 2018-11-23 DIAGNOSIS — E119 Type 2 diabetes mellitus without complications: Secondary | ICD-10-CM | POA: Diagnosis not present

## 2018-11-23 MED FILL — VERAPAMIL ER PM 300 MG CAP: 300 | 90 days supply | Qty: 90 | Fill #0

## 2018-11-27 NOTE — Progress Notes (Signed)
Office: [phone]  /  Fax: [phone] TeleHealth Visit:  Rhonda Aguilar has verbally consented to this TeleHealth visit today. The patient is located at home, the provider is located at the [facility] office. The participants in this visit include the listed provider and patient and any and all parties involved. The visit was conducted today via FaceTime.  HPI:   Chief Complaint: OBESITY Rhonda Aguilar is here to discuss her progress with her obesity treatment plan. She is on the Category 3 plan and is following her eating plan approximately 50 % of the time. She states she is exercising 0 minutes 0 times per week. Rhonda Aguilar reports that her most recent weight is 234 pounds (11/23/18). She currently has a GI virus and has not been able to eat on plan. Rhonda Aguilar wants to start walking. We were unable to weigh the patient today for this TeleHealth visit. She feels as if she has lost weight since her last visit. She has lost 0 lbs since starting treatment with [name].  Diabetes II Rhonda Aguilar has a diagnosis of diabetes type II. Rhonda Aguilar is on Jardiance and she denies hypoglycemia, UTI or yeast infection. Her most recent A1c was at 6.5 She has been working on intensive lifestyle modifications including diet, exercise, and weight loss to help control her blood glucose levels.  ASSESSMENT AND PLAN:  Type 2 diabetes mellitus without complication, without long-term current use of insulin ([name])  Class 3 severe obesity with serious comorbidity and body mass index (BMI) of 40.0 to 44.9 in adult, unspecified obesity type (Rhonda Aguilar)  PLAN:  Diabetes II Rhonda Aguilar has been given extensive diabetes education by myself today including ideal fasting and post-prandial blood glucose readings, individual ideal Hgb A1c goals and hypoglycemia prevention. We discussed the importance of good blood sugar control to decrease the likelihood of diabetic complications such as nephropathy, neuropathy, limb loss, blindness, coronary  artery disease, and death. We discussed the importance of intensive lifestyle modification including diet, exercise and weight loss as the first line treatment for diabetes. Rhonda Aguilar agrees to continue with medications and weight loss, and she will follow up at the agreed upon time.  Obesity Rhonda Aguilar is currently in the action stage of change. As such, her goal is to continue with weight loss efforts She has agreed to follow the Category 3 plan Rhonda Aguilar has been instructed to work up to a goal of 150 minutes of combined cardio and strengthening exercise per week for weight loss and overall health benefits. We discussed the following Behavioral Modification Strategies today: keeping healthy foods in the home and work on meal planning and easy cooking plans  We will e-mail chili recipe and chicken tomatillo recipe to patient.  Rhonda Aguilar has agreed to follow up with our clinic in 2 weeks. She was informed of the importance of frequent follow up visits to maximize her success with intensive lifestyle modifications for her multiple health conditions.  I spent > than 50% of the 25 minute visit on counseling as documented in the note.    ALLERGIES: Allergies  Allergen Reactions   Codeine Itching and Rash   Penicillins Rash    MEDICATIONS: Current Outpatient Medications on File Prior to Visit  Medication Sig Dispense Refill   ALPRAZolam (XANAX) 0.5 MG tablet Take 0.25-1 mg by mouth every 8 (eight) hours as needed for anxiety.     busPIRone (BUSPAR) 5 MG tablet Take 1 tablet (5 mg total) by mouth daily. 90 tablet 3   cetirizine (ZYRTEC) 10 MG  tablet Take 10 mg by mouth daily.     cyclobenzaprine (FLEXERIL) 10 MG tablet Take 10 mg by mouth 3 (three) times daily as needed for muscle spasms.     dexlansoprazole (DEXILANT) 60 MG capsule Take 60 mg by mouth daily.     empagliflozin (JARDIANCE) 10 MG TABS tablet Take 10 mg by mouth daily. 30 tablet 0   ibuprofen (ADVIL,MOTRIN) 600 MG tablet Take 600 mg  by mouth every 6 (six) hours as needed for mild pain.     Peppermint Oil (IBGARD PO) Take by mouth.     pravastatin (PRAVACHOL) 10 MG tablet Take 10 mg by mouth daily.     Probiotic Product (ALIGN) 4 MG CAPS Take by mouth.     promethazine (PHENERGAN) 12.5 MG tablet Take 1 tablet (12.5 mg total) by mouth every 8 (eight) hours as needed for nausea, vomiting or refractory nausea / vomiting. 60 tablet 3   valsartan (DIOVAN) 320 MG tablet Take 320 mg by mouth daily.      Verapamil HCl CR 300 MG CP24 Take 300 mg by mouth at bedtime.      Vitamin D, Ergocalciferol, (DRISDOL) 1.25 MG (50000 UT) CAPS capsule TAKE 1 CAPSULE BY MOUTH EVERY 7 DAYS 4 capsule 0   No current facility-administered medications on file prior to visit.     PAST MEDICAL HISTORY: Past Medical History:  Diagnosis Date   Benign essential HTN 11/22/2014   Diabetes mellitus, type II ([name])    Fatty liver    GERD (gastroesophageal reflux disease)    Hypertension    IBS (irritable bowel syndrome)    Migraine    Obesity (BMI 30-39.9) 11/22/2014   OSA (obstructive sleep apnea)     PAST SURGICAL HISTORY: Past Surgical History:  Procedure Laterality Date   COLONOSCOPY     TONSILLECTOMY      SOCIAL HISTORY: Social History   Tobacco Use   Smoking status: Never Smoker   Smokeless tobacco: Never Used  Substance Use Topics   Alcohol use: Yes    Alcohol/week: 0.0 - 1.0 standard drinks   Drug use: Never    FAMILY HISTORY: Family History  Problem Relation Age of Onset   Diabetes Mother    Heart disease Mother    Hypertension Mother    High Cholesterol Mother    Kidney disease Mother    Depression Mother    Obesity Mother    Coronary artery disease Father    High blood pressure Father    High Cholesterol Father    Depression Father    Hypertension Brother     ROS: Review of Systems  Constitutional: Negative for weight loss.  Endo/Heme/Allergies:       Negative for  hypoglycemia    PHYSICAL EXAM: Pt in no acute distress  RECENT LABS AND TESTS: BMET    Component Value Date/Time   NA 138 12/08/2017 1306   K 4.1 12/08/2017 1306   CL 101 12/08/2017 1306   CO2 18 (L) 12/08/2017 1306   GLUCOSE 122 (H) 12/08/2017 1306   BUN 17 12/08/2017 1306   CREATININE 0.73 12/08/2017 1306   CALCIUM 9.2 12/08/2017 1306   GFRNONAA 87 12/08/2017 1306   GFRAA 101 12/08/2017 1306   No results found for: HGBA1C Lab Results  Component Value Date   INSULIN 40.9 (H) 04/12/2018   INSULIN 38.2 (H) 12/08/2017   CBC    Component Value Date/Time   WBC 8.5 12/08/2017 1306   WBC 16.3 (H) 12/19/2014 1120  RBC 4.64 12/08/2017 1306   RBC 4.92 12/19/2014 1120   HGB 12.9 12/08/2017 1306   HCT 38.1 12/08/2017 1306   PLT 263 12/19/2014 1120   MCV 82 12/08/2017 1306   MCH 27.8 12/08/2017 1306   MCH 27.8 12/19/2014 1120   MCHC 33.9 12/08/2017 1306   MCHC 33.5 12/19/2014 1120   RDW 14.0 12/08/2017 1306   LYMPHSABS 2.5 12/08/2017 1306   MONOABS 1.5 (H) 12/19/2014 1120   EOSABS 0.2 12/08/2017 1306   BASOSABS 0.0 12/08/2017 1306   Iron/TIBC/Ferritin/ %Sat No results found for: IRON, TIBC, FERRITIN, IRONPCTSAT Lipid Panel     Component Value Date/Time   CHOL 220 (H) 04/12/2018 1036   TRIG 196 (H) 04/12/2018 1036   HDL 43 04/12/2018 1036   LDLCALC 138 (H) 04/12/2018 1036   Hepatic Function Panel     Component Value Date/Time   PROT 6.7 12/08/2017 1306   ALBUMIN 4.4 12/08/2017 1306   AST 90 (H) 12/08/2017 1306   ALT 118 (H) 12/08/2017 1306   ALKPHOS 86 12/08/2017 1306   BILITOT 0.5 12/08/2017 1306      Component Value Date/Time   TSH 2.550 12/08/2017 1306     Ref. Range 04/12/2018 10:36  Vitamin D, 25-Hydroxy Latest Ref Range: 30.0 - 100.0 ng/mL 26.7 (L)    I, Doreene Nest, am acting as transcriptionist for [name], PA-C I, [name], PA-C have reviewed above note and agree with its content

## 2018-11-27 NOTE — Progress Notes (Signed)
Office: [phone]  /  Fax: [phone]    Date: November 29, 2018   Appointment Start Time: 12:30pm Duration: 31 minutes Provider: Glennie Isle, Psy.D. Type of Session: Individual Therapy  Location of Patient: Parked in car Location of Provider: Provider's Home Type of Contact: Telepsychological Visit via [employer] WebEx   Session Content: Rhonda Aguilar is a 63 y.o. female presenting via [facility] [place] for a follow-up appointment to address the previously established treatment goal of decreasing emotional eating. Today's appointment was a telepsychological visit, as it is an option for appointments to reduce exposure to COVID-19. Rhonda Aguilar expressed understanding regarding the rationale for telepsychological services, and provided verbal consent for today's appointment. Prior to proceeding with today's appointment, Rhonda Aguilar's physical location at the time of this appointment was obtained. Rhonda Aguilar reported she was parked in her car near her hair dresser and provided the address. In the event of technical difficulties, [name] Rhonda Aguilar and this provider participated in today's telepsychological service. Also, Aubrianah denied anyone else being present in the car or on the WebEx appointment.  This provider conducted a brief check-in and verbally administered the PHQ-9 and GAD-7. Rhonda Aguilar shared, "I have some GI thing." She noted she discussed what to eat with Rhonda Potash, PA-C. She shared a plan to reach out to her "GI doctor" later in the day or tomorrow if symptoms persist. In addition, Rhonda Aguilar shared she has not reached out to her other therapist to schedule an appointment. She added, "Some Aguilar are better," but indicated she would send her other therapist an e-mail today. Regarding eating, Rhonda Aguilar recalled checking-in with herself about physical versus emotional hunger prior to getting sick. Positive reinforcement was provided. Furthermore, psychoeducation regarding triggers for  emotional eating was provided. Rhonda Aguilar was provided a handout, and encouraged to utilize the handout between now and the next appointment to increase awareness of triggers and frequency. Rhonda Aguilar agreed. This provider also discussed behavioral strategies for specific triggers, such as placing the utensil down when conversing to avoid mindless eating. [name] provided verbal consent during today's appointment for this provider to send the handout about triggers via e-mail. Rhonda Aguilar was receptive to today's session as evidenced by openness to sharing, responsiveness to feedback, and willingness to explore triggers for emotional eating.  Mental Status Examination:  Appearance: neat Behavior: cooperative Mood: euthymic Affect: mood congruent Speech: normal in rate, volume, and tone Eye Contact: appropriate Psychomotor Activity: appropriate Thought Process: linear, logical, and goal directed  Content/Perceptual Disturbances: no hallucinations, delusions, bizarre thinking or behavior reported or observed and no evidence of suicidal and homicidal ideation, plan, and intent Orientation: time, person, place and purpose of appointment Cognition/Sensorium: memory, attention, language, and fund of knowledge intact  Insight: good Judgment: good  Structured Assessment Results: The Patient Health Questionnaire-9 (PHQ-9) is a self-report measure that assesses symptoms and severity of depression over the course of the last two weeks. Rhonda Aguilar obtained a score of 2 suggesting minimal depression. Rhonda Aguilar finds the endorsed symptoms to be not difficult at all. Rhonda Aguilar 0  Feeling down, depressed, or hopeless 0  Trouble falling or staying asleep, or sleeping too much-due to being sick 1  Feeling tired or having Rhonda energy 0  Poor appetite or overeating- due to being sick 1  Feeling bad about yourself --- or that you are a failure or have let yourself or your family down 0  Trouble  concentrating on Aguilar, such as reading the newspaper or  watching television 0  Moving or speaking so slowly that other people could have noticed? Or the opposite --- being so fidgety or restless that you have been moving around a lot more than usual 0  Thoughts that you would be better off dead or hurting yourself in some way 0  PHQ-9 Score 2    The Generalized Anxiety Disorder-7 (GAD-7) is a brief self-report measure that assesses symptoms of anxiety over the course of the last two weeks. Rhonda Aguilar obtained a score of 2 suggesting minimal anxiety. Rhonda Aguilar finds the endorsed symptoms to be not difficult at all. Feeling nervous, anxious, on edge 1  Not being able to stop or control worrying 0  Worrying too much about different Aguilar 1  Trouble relaxing 0  Being so restless that it's hard to sit still 0  Becoming easily annoyed or irritable 0  Feeling afraid as if something awful might happen 0  GAD-7 Score 2   Interventions:  Conducted a brief chart review Verbal administration of PHQ-9 and GAD-7 for symptom monitoring Provided empathic reflections and validation Reviewed content from the previous session Psychoeducation provided regarding triggers for emotional eating Focused on rapport building Employed supportive psychotherapy interventions to facilitate reduced distress, and to improve coping skills with identified stressors  Positive reinforcement provided   DSM-5 Diagnosis: 311 (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 Rhonda Aguilar has demonstrated some progress in her goal as evidenced by increased awareness of hunger patterns.   Plan: Rhonda Aguilar continues to appear able and willing to participate as evidenced by engagement in reciprocal conversation, and asking questions for clarification as appropriate. The next  appointment will be scheduled in three weeks, which will be via [employer]. The next session will focus on the introduction of mindfulness.

## 2018-11-29 ENCOUNTER — Ambulatory Visit (INDEPENDENT_AMBULATORY_CARE_PROVIDER_SITE_OTHER): Payer: 59 | Admitting: Psychology

## 2018-11-29 ENCOUNTER — Other Ambulatory Visit: Payer: Self-pay

## 2018-11-29 DIAGNOSIS — F3289 Other specified depressive episodes: Secondary | ICD-10-CM | POA: Diagnosis not present

## 2018-12-05 DIAGNOSIS — F341 Dysthymic disorder: Secondary | ICD-10-CM | POA: Diagnosis not present

## 2018-12-11 ENCOUNTER — Encounter (INDEPENDENT_AMBULATORY_CARE_PROVIDER_SITE_OTHER): Payer: Self-pay | Admitting: Physician Assistant

## 2018-12-11 ENCOUNTER — Ambulatory Visit (INDEPENDENT_AMBULATORY_CARE_PROVIDER_SITE_OTHER): Payer: 59 | Admitting: Physician Assistant

## 2018-12-11 ENCOUNTER — Other Ambulatory Visit: Payer: Self-pay

## 2018-12-11 VITALS — BP 125/80 | HR 80 | Temp 98.0°F | Ht 62.0 in | Wt 233.0 lb

## 2018-12-11 DIAGNOSIS — Z9189 Other specified personal risk factors, not elsewhere classified: Secondary | ICD-10-CM

## 2018-12-11 DIAGNOSIS — E7849 Other hyperlipidemia: Secondary | ICD-10-CM | POA: Diagnosis not present

## 2018-12-11 DIAGNOSIS — E559 Vitamin D deficiency, unspecified: Secondary | ICD-10-CM

## 2018-12-11 DIAGNOSIS — Z6841 Body Mass Index (BMI) 40.0 and over, adult: Secondary | ICD-10-CM | POA: Diagnosis not present

## 2018-12-11 MED ORDER — CO Q-10 300 MG PO CAPS: 300.0000 mg | ORAL_CAPSULE | Freq: Every day | ORAL | 0 refills | Status: DC

## 2018-12-11 MED ORDER — VITAMIN D (ERGOCALCIFEROL) 1.25 MG (50000 UNIT) PO CAPS: ORAL_CAPSULE | ORAL | 0 refills | Status: DC

## 2018-12-11 MED FILL — VIT D2 1.25 MG (50,000 UNIT: 1.25 MG | 28 days supply | Qty: 4 | Fill #0

## 2018-12-11 MED FILL — VALSARTAN 320 MG TAB: 320 | 30 days supply | Qty: 30 | Fill #3

## 2018-12-11 MED FILL — PEG-3350 AND ELECTROLYTES S: 236 | 2 days supply | Qty: 4000 | Fill #0

## 2018-12-12 LAB — VITAMIN D 25 HYDROXY (VIT D DEFICIENCY, FRACTURES): Vit D, 25-Hydroxy: 27.2 ng/mL — ABNORMAL LOW (ref 30.0–100.0)

## 2018-12-13 NOTE — Progress Notes (Signed)
Office: [phone]  /  Fax: [phone]   HPI:   Chief Complaint: OBESITY Rhonda Aguilar is here to discuss her progress with her obesity treatment plan. She is on the Category 3 plan and is following her eating plan approximately 0 % of the time. She states she is exercising 0 minutes 0 times per week. Rhonda Aguilar reports that she has struggled with a GI virus and she has not been able to eat all of the food on the plan.  Her weight is 233 lb (105.7 kg) today and she has maintained weight since her last visit. She has gained 2 lbs since starting treatment with [name].  Vitamin D deficiency Rhonda Aguilar has a diagnosis of vitamin D deficiency. Rhonda Aguilar is currently taking vit D and she denies nausea, vomiting or muscle weakness.  Hyperlipidemia Rhonda Aguilar has hyperlipidemia and she is on Pravastatin. She has been trying to improve her cholesterol levels with intensive lifestyle modification including a low saturated fat diet, exercise and weight loss. Rhonda Aguilar reports muscle aches and she denies chest pain.  At risk for cardiovascular disease Rhonda Aguilar is at a higher than average risk for cardiovascular disease due to obesity and hyperlipidemia. She currently denies any chest pain.  ASSESSMENT AND PLAN:  Vitamin D deficiency - Plan: VITAMIN D 25 Hydroxy (Vit-D Deficiency, Fractures), Vitamin D, Ergocalciferol, (DRISDOL) 1.25 MG (50000 UT) CAPS capsule  Other hyperlipidemia - Plan: Coenzyme Q10 (CO Q-10) 300 MG CAPS  At risk for heart disease  Class 3 severe obesity with serious comorbidity and body mass index (BMI) of 40.0 to 44.9 in adult, unspecified obesity type ([name])  PLAN:  Vitamin D Deficiency Rhonda Aguilar was informed that low vitamin D levels contributes to fatigue and are associated with obesity, breast, and colon cancer. Starlit agrees to continue to take prescription Vit D @50 ,000 IU every week #4 with no refills and she will follow up for routine testing of vitamin D, at least 2-3 times per year. She was informed  of the risk of over-replacement of vitamin D and agrees to not increase her dose unless she discusses this with [name] first. We will check labs and Rhonda Aguilar agrees to follow up as directed.  Hyperlipidemia Rhonda Aguilar was informed of the American Heart Association Guidelines emphasizing intensive lifestyle modifications as the first line treatment for hyperlipidemia. We discussed many lifestyle modifications today in depth, and Rhonda Aguilar will continue to work on decreasing saturated fats such as fatty red meat, butter and many fried foods. She will also increase vegetables and lean protein in her diet and continue to work on exercise and weight loss efforts. Rhonda Aguilar will continue with medications and she will start OTC CoQ10 300 mg daily. Rhonda Aguilar agrees to follow up with our clinic in 2 weeks.  Cardiovascular risk counseling Rhonda Aguilar was given extended (15 minutes) coronary artery disease prevention counseling today. She is 63 y.o. female and has risk factors for heart disease including obesity and hyperlipidemia. We discussed intensive lifestyle modifications today with an emphasis on specific weight loss instructions and strategies. Pt was also informed of the importance of increasing exercise and decreasing saturated fats to help prevent heart disease.  Obesity Rhonda Aguilar is currently in the action stage of change. As such, her goal is to continue with weight loss efforts She has agreed to follow the Category 3 plan Rhonda Aguilar has been instructed to work up to a goal of 150 minutes of combined cardio and strengthening exercise per week for weight loss and overall health benefits. We discussed the following Behavioral  Modification Strategies today: no skipping meals and work on meal planning and easy cooking plans  Rhonda Aguilar has agreed to follow up with our clinic in 2 weeks. She was informed of the importance of frequent follow up visits to maximize her success with intensive lifestyle modifications for her multiple health  conditions.  ALLERGIES: Allergies  Allergen Reactions   Codeine Itching and Rash   Penicillins Rash    MEDICATIONS: Current Outpatient Medications on File Prior to Visit  Medication Sig Dispense Refill   ALPRAZolam (XANAX) 0.5 MG tablet Take 0.25-1 mg by mouth every 8 (eight) hours as needed for anxiety.     busPIRone (BUSPAR) 5 MG tablet Take 1 tablet (5 mg total) by mouth daily. 90 tablet 3   cetirizine (ZYRTEC) 10 MG tablet Take 10 mg by mouth daily.     cyclobenzaprine (FLEXERIL) 10 MG tablet Take 10 mg by mouth 3 (three) times daily as needed for muscle spasms.     dexlansoprazole (DEXILANT) 60 MG capsule Take 60 mg by mouth daily.     empagliflozin (JARDIANCE) 10 MG TABS tablet Take 10 mg by mouth daily. 30 tablet 0   ibuprofen (ADVIL,MOTRIN) 600 MG tablet Take 600 mg by mouth every 6 (six) hours as needed for mild pain.     Peppermint Oil (IBGARD PO) Take by mouth.     pravastatin (PRAVACHOL) 10 MG tablet Take 10 mg by mouth daily.     Probiotic Product (ALIGN) 4 MG CAPS Take by mouth.     promethazine (PHENERGAN) 12.5 MG tablet Take 1 tablet (12.5 mg total) by mouth every 8 (eight) hours as needed for nausea, vomiting or refractory nausea / vomiting. 60 tablet 3   valsartan (DIOVAN) 320 MG tablet Take 320 mg by mouth daily.      Verapamil HCl CR 300 MG CP24 Take 300 mg by mouth at bedtime.      No current facility-administered medications on file prior to visit.     PAST MEDICAL HISTORY: Past Medical History:  Diagnosis Date   Benign essential HTN 11/22/2014   Diabetes mellitus, type II ([name])    Fatty liver    GERD (gastroesophageal reflux disease)    Hypertension    IBS (irritable bowel syndrome)    Migraine    Obesity (BMI 30-39.9) 11/22/2014   OSA (obstructive sleep apnea)     PAST SURGICAL HISTORY: Past Surgical History:  Procedure Laterality Date   COLONOSCOPY     TONSILLECTOMY      SOCIAL HISTORY: Social History   Tobacco Use    Smoking status: Never Smoker   Smokeless tobacco: Never Used  Substance Use Topics   Alcohol use: Yes    Alcohol/week: 0.0 - 1.0 standard drinks   Drug use: Never    FAMILY HISTORY: Family History  Problem Relation Age of Onset   Diabetes Mother    Heart disease Mother    Hypertension Mother    High Cholesterol Mother    Kidney disease Mother    Depression Mother    Obesity Mother    Coronary artery disease Father    High blood pressure Father    High Cholesterol Father    Depression Father    Hypertension Brother     ROS: Review of Systems  Constitutional: Negative for weight loss.  Cardiovascular: Negative for chest pain.  Gastrointestinal: Negative for nausea and vomiting.  Musculoskeletal: Positive for myalgias.       Negative for muscle weakness    PHYSICAL EXAM:  Blood pressure 125/80, pulse 80, temperature 98 F (36.7 C), temperature source Oral, height 5\' 2"  (1.575 m), weight 233 lb (105.7 kg), SpO2 95 %. Body mass index is 42.62 kg/m. Physical Exam Vitals signs reviewed.  Constitutional:      Appearance: Normal appearance. She is well-developed. She is obese.  Cardiovascular:     Rate and Rhythm: Normal rate.  Pulmonary:     Effort: Pulmonary effort is normal.  Musculoskeletal: Normal range of motion.  Skin:    General: Skin is warm and dry.  Neurological:     Mental Status: She is alert and oriented to person, place, and time.  Psychiatric:        Mood and Affect: Mood normal.        Behavior: Behavior normal.     RECENT LABS AND TESTS: BMET    Component Value Date/Time   NA 138 12/08/2017 1306   K 4.1 12/08/2017 1306   CL 101 12/08/2017 1306   CO2 18 (L) 12/08/2017 1306   GLUCOSE 122 (H) 12/08/2017 1306   BUN 17 12/08/2017 1306   CREATININE 0.73 12/08/2017 1306   CALCIUM 9.2 12/08/2017 1306   GFRNONAA 87 12/08/2017 1306   GFRAA 101 12/08/2017 1306   No results found for: HGBA1C Lab Results  Component Value Date    INSULIN 40.9 (H) 04/12/2018   INSULIN 38.2 (H) 12/08/2017   CBC    Component Value Date/Time   WBC 8.5 12/08/2017 1306   WBC 16.3 (H) 12/19/2014 1120   RBC 4.64 12/08/2017 1306   RBC 4.92 12/19/2014 1120   HGB 12.9 12/08/2017 1306   HCT 38.1 12/08/2017 1306   PLT 263 12/19/2014 1120   MCV 82 12/08/2017 1306   MCH 27.8 12/08/2017 1306   MCH 27.8 12/19/2014 1120   MCHC 33.9 12/08/2017 1306   MCHC 33.5 12/19/2014 1120   RDW 14.0 12/08/2017 1306   LYMPHSABS 2.5 12/08/2017 1306   MONOABS 1.5 (H) 12/19/2014 1120   EOSABS 0.2 12/08/2017 1306   BASOSABS 0.0 12/08/2017 1306   Iron/TIBC/Ferritin/ %Sat No results found for: IRON, TIBC, FERRITIN, IRONPCTSAT Lipid Panel     Component Value Date/Time   CHOL 220 (H) 04/12/2018 1036   TRIG 196 (H) 04/12/2018 1036   HDL 43 04/12/2018 1036   LDLCALC 138 (H) 04/12/2018 1036   Hepatic Function Panel     Component Value Date/Time   PROT 6.7 12/08/2017 1306   ALBUMIN 4.4 12/08/2017 1306   AST 90 (H) 12/08/2017 1306   ALT 118 (H) 12/08/2017 1306   ALKPHOS 86 12/08/2017 1306   BILITOT 0.5 12/08/2017 1306      Component Value Date/Time   TSH 2.550 12/08/2017 1306     Ref. Range 04/12/2018 10:36  Vitamin D, 25-Hydroxy Latest Ref Range: 30.0 - 100.0 ng/mL 26.7 (L)    OBESITY BEHAVIORAL INTERVENTION VISIT  Today's visit was # 22  Starting weight: 231 lbs Starting date: 12/08/2017 Today's weight : 233 lbs Today's date: 12/11/2018 Total lbs lost to date: 0    12/11/2018  Height 5\' 2"  (1.575 m)  Weight 233 lb (105.7 kg)  BMI (Calculated) 42.61  BLOOD PRESSURE - SYSTOLIC 0000000  BLOOD PRESSURE - DIASTOLIC 80   Body Fat % 49 %  Total Body Water (lbs) 82.8 lbs    ASK: We discussed the diagnosis of obesity with Rhonda Aguilar today and Rhonda Aguilar agreed to give [name] permission to discuss obesity behavioral modification therapy today.  ASSESS: Monia has the diagnosis of  obesity and her BMI today is 42.61 Nasheka is in the action stage of  change   ADVISE: Shawntina was educated on the multiple health risks of obesity as well as the benefit of weight loss to improve her health. She was advised of the need for long term treatment and the importance of lifestyle modifications to improve her current health and to decrease her risk of future health problems.  AGREE: Multiple dietary modification options and treatment options were discussed and  Rhonda Aguilar agreed to follow the recommendations documented in the above note.  ARRANGE: Rhonda Aguilar was educated on the importance of frequent visits to treat obesity as outlined per CMS and USPSTF guidelines and agreed to schedule her next follow up appointment today.  Corey Skains, am acting as transcriptionist for [name], PA-C I, [name], PA-C have reviewed above note and agree with its content

## 2018-12-18 ENCOUNTER — Encounter (INDEPENDENT_AMBULATORY_CARE_PROVIDER_SITE_OTHER): Payer: Self-pay | Admitting: Physician Assistant

## 2018-12-19 MED FILL — DEXILANT DR 60 MG CAPSULE: 60 | 30 days supply | Qty: 30 | Fill #1

## 2018-12-19 NOTE — Progress Notes (Signed)
Office: [phone]  /  Fax: [phone]    Date: December 25, 2018   Appointment Start Time: 2:00pm Duration: 30 minutes Provider: Glennie Isle, Psy.D. Type of Session: Individual Therapy  Location of Patient: Home Location of Provider: Provider's Home Type of Contact: Telepsychological Visit via [employer] Web[employer]   Session Content: Shanaria is a 63 y.o. female presenting via [place] for a follow-up appointment to address the previously established treatment goal of decreasing emotional eating. Today's appointment was a telepsychological visit, as it is an option for appointments to reduce exposure to COVID-19. Ketara expressed understanding regarding the rationale for telepsychological services, and provided verbal consent for today's appointment. Prior to proceeding with today's appointment, Gisela's physical location at the time of this appointment was obtained. In the event of technical difficulties, Idalene shared a phone number she could be reached at. Kalin and this provider participated in today's telepsychological service. Also, Valenda denied anyone else being present in the room or on the WebEx appointment.  This provider conducted a brief check-in and verbally administered the PHQ-9 and GAD-7. Margreat shared about recent events, including being sick for 2.5-3 weeks. As such, she reported a decrease in appetite. Tecia noted a plan to resume the meal plan today. She further reported a desire to increase motivation to exercise. Thus, psychoeducation regarding mindfulness was provided. A handout was provided to Keyry with further information regarding mindfulness, including exercises. This provider also explained the benefit of mindfulness as it relates to emotional eating. During today's appointment, Cather was led through a mindfulness exercise involving her senses. Karlene was encouraged to engage in the provided exercises between now and the next appointment with this provider, specifically the  mindfulness walking exercise. Raelle agreed. Unica provided verbal consent during today's appointment for this provider to send a handout about mindfulness via e-mail. Overall, Findlay was receptive to today's session as evidenced by openness to sharing, responsiveness to feedback, and willingness to engage in mindfulness exercises, specifically mindfulness walks.  Mental Status Examination:  Appearance: neat Behavior: cooperative Mood: euthymic Affect: mood congruent Speech: normal in rate, volume, and tone Eye Contact: appropriate Psychomotor Activity: appropriate Thought Process: linear, logical, and goal directed  Content/Perceptual Disturbances: no hallucinations, delusions, bizarre thinking or behavior reported or observed and no evidence of suicidal and homicidal ideation, plan, and intent Orientation: time, person, place and purpose of appointment Cognition/Sensorium: memory, attention, language, and fund of knowledge intact  Insight: good Judgment: good  Structured Assessment Results: The Patient Health Questionnaire-9 (PHQ-9) is a self-report measure that assesses symptoms and severity of depression over the course of the last two weeks. Breslin obtained a score of 2 suggesting minimal depression. Saleisha finds the endorsed symptoms to be not difficult at all. [name] in doing things 0  Feeling down, depressed, or hopeless 0  Trouble falling or staying asleep, or sleeping too much 0  Feeling tired or having little energy 1  Poor appetite or overeating 1  Feeling bad about yourself --- or that you are a failure or have let yourself or your family down 0  Trouble concentrating on things, such as reading the newspaper or watching television 0  Moving or speaking so slowly that other people could have noticed? Or the opposite --- being so fidgety or restless that you have been moving around a lot more than usual 0  Thoughts that you would be better off dead or hurting  yourself in some way 0  PHQ-9 Score 2    The  Generalized Anxiety Disorder-7 (GAD-7) is a brief self-report measure that assesses symptoms of anxiety over the course of the last two weeks. Empryss obtained a score of 2 suggesting minimal anxiety. Dhamar finds the endorsed symptoms to be not difficult at all. Feeling nervous, anxious, on edge 0  Not being able to stop or control worrying 1  Worrying too much about different things 0  Trouble relaxing 0  Being so restless that it's hard to sit still 0  Becoming easily annoyed or irritable 1  Feeling afraid as if something awful might happen 0  GAD-7 Score 2   Interventions:  Conducted a brief chart review Verbal administration of PHQ-9 and GAD-7 for symptom monitoring Provided empathic reflections and validation Reviewed content from the previous session Psychoeducation provided regarding mindfulness Engaged patient in a mindfulness exercise Employed supportive psychotherapy interventions to facilitate reduced distress, and to improve coping skills with identified stressors Employed acceptance and commitment interventions to emphasize mindfulness and acceptance without struggle  DSM-5 Diagnosis: 311 (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 Emoni has demonstrated progress in her goal as evidenced by increased awareness of hunger patterns and triggers for emotional eating. Davonna also demonstrates willingness to engage in mindfulness exercises.  Plan: Rayn continues to appear able and willing to participate as evidenced by engagement in reciprocal conversation, and asking questions for clarification as appropriate. Based on Infinity's work schedule, the next appointment will be scheduled in three weeks, which will be via [employer]. The next session will focus further  on mindfulness.

## 2018-12-20 MED FILL — FREESTYLE LANCETS: 100 days supply | Qty: 200 | Fill #0

## 2018-12-20 MED FILL — FREESTYLE LITE TEST STRIP: 90 days supply | Qty: 200 | Fill #0

## 2018-12-22 DIAGNOSIS — K621 Rectal polyp: Secondary | ICD-10-CM | POA: Diagnosis not present

## 2018-12-22 DIAGNOSIS — Z1211 Encounter for screening for malignant neoplasm of colon: Secondary | ICD-10-CM | POA: Diagnosis not present

## 2018-12-22 DIAGNOSIS — K5730 Diverticulosis of large intestine without perforation or abscess without bleeding: Secondary | ICD-10-CM | POA: Diagnosis not present

## 2018-12-22 DIAGNOSIS — D128 Benign neoplasm of rectum: Secondary | ICD-10-CM | POA: Diagnosis not present

## 2018-12-25 ENCOUNTER — Ambulatory Visit (INDEPENDENT_AMBULATORY_CARE_PROVIDER_SITE_OTHER): Payer: 59 | Admitting: Psychology

## 2018-12-25 ENCOUNTER — Other Ambulatory Visit: Payer: Self-pay

## 2018-12-25 DIAGNOSIS — F3289 Other specified depressive episodes: Secondary | ICD-10-CM

## 2019-01-03 ENCOUNTER — Ambulatory Visit: Payer: 59 | Admitting: Physician Assistant

## 2019-01-03 NOTE — Progress Notes (Signed)
Office: [phone]  /  Fax: [phone]    Date: January 15, 2019   Appointment Start Time: 4:30pm Duration: 27 minutes Provider: Glennie Isle, Psy.D. Type of Session: Individual Therapy  Location of Patient: Home Location of Provider: [facility] Type of Contact: Telepsychological Visit via [employer] WebEx   Session Content: Rhonda Aguilar is a 63 y.o. female presenting via [place] for a follow-up appointment to address the previously established treatment goal of decreasing emotional eating. Today's appointment was a telepsychological visit, as it is an option for appointments to reduce exposure to COVID-19. Rhonda Aguilar expressed understanding regarding the rationale for telepsychological services, and provided verbal consent for today's appointment. Prior to proceeding with today's appointment, Rhonda Aguilar's physical location at the time of this appointment was obtained. In the event of technical difficulties, [name] Rhonda Aguilar and this provider participated in today's telepsychological service. Also, Rhonda Aguilar denied anyone else being present in the room or on the WebEx appointment.  This provider conducted a brief check-in and verbally administered the PHQ-9 and GAD-7. Rhonda Aguilar shared she has been busy with work. She acknowledged she has not been following structured meal plan, but reported a reduction in emotional eating. This was explored further. Rhonda Aguilar stated she started eating congruent to the meal plan, but believes she is having issues with dairy products. She indicated a plan to resume the meal plan on Wednesday, as it is her day off. Obstacles/barriers that may prevent her from follow through were explored. She expressed concern about how she may react to dairy products. Rhonda Aguilar stated she spoke about the aforementioned with Rhonda Potash, PA-C; therefore, it was recommended she send Rhonda Potash, PA-C a MyChart message to discuss the concern  further. She agreed. Moreover, Rhonda Aguilar shared she engaged in the shared mindfulness exercise (5-4-3-2-1) on two occassions, and described it as being "helpful." Positive reinforcement was provided. It was recommended she engage in mindfulness daily and the rationale behind that was discussed. In addition, psychoeducation regarding formal (e.g., setting aside a specific time daily to engage in an exercise) and informal (e.g., cultivating awareness in the present moment and taking a non-judgmental approach while engaging in day-to-day tasks) mindfulness was provided. This provider also discussed the utilization of YouTube for mindfulness exercises (e.g., videos by Merri Ray). Rhonda Aguilar was receptive to today's session as evidenced by openness to sharing, responsiveness to feedback, and willingness to continue engaging in mindfulness exercises.  Mental Status Examination:  Appearance: neat Behavior: cooperative Mood: euthymic Affect: mood congruent Speech: normal in rate, volume, and tone Eye Contact: appropriate Psychomotor Activity: appropriate Thought Process: linear, logical, and goal directed  Content/Perceptual Disturbances: no hallucinations, delusions, bizarre thinking or behavior reported or observed and no evidence of suicidal and homicidal ideation, plan, and intent Orientation: time, person, place and purpose of appointment Cognition/Sensorium: memory, attention, language, and fund of knowledge intact  Insight: good Judgment: good  Structured Assessment Results: The Patient Health Questionnaire-9 (PHQ-9) is a self-report measure that assesses symptoms and severity of depression over the course of the last two weeks. Rhonda Aguilar obtained a score of 1 suggesting minimal depression. Rhonda Aguilar finds the endorsed symptoms to be somewhat difficult. Rhonda Aguilar or pleasure in doing things 0  Feeling down, depressed, or hopeless 0  Trouble falling or staying asleep, or sleeping too much 0  Feeling  tired or having Rhonda energy 1  Rhonda Aguilar or overeating 0  Feeling bad about yourself --- or that you are a failure  or have let yourself or your family down 0  Trouble concentrating on things, such as reading the newspaper or watching television 0  Moving or speaking so slowly that other people could have noticed? Or the opposite --- being so fidgety or restless that you have been moving around a lot more than usual 0  Thoughts that you would be better off dead or hurting yourself in some way 0  PHQ-9 Score 1    The Generalized Anxiety Disorder-7 (GAD-7) is a brief self-report measure that assesses symptoms of anxiety over the course of the last two weeks. Rhonda Aguilar obtained a score of 0. Feeling nervous, anxious, on edge 0  Not being able to stop or control worrying 0  Worrying too much about different things 0  Trouble relaxing 0  Being so restless that it's hard to sit still 0  Becoming easily annoyed or irritable 0  Feeling afraid as if something awful might happen 0  GAD-7 Score 0   Interventions:  Conducted a brief chart review Verbal administration of PHQ-9 and GAD-7 for symptom monitoring Provided empathic reflections and validation Reviewed content from the previous session Psychoeducation provided regarding mindfulness Employed motivational interviewing skills to assess patient's willingness/desire to adhere to recommended medical treatments and assignments Employed supportive psychotherapy interventions to facilitate reduced distress, and to improve coping skills with identified stressors  Provided positive reinforcement   DSM-5 Diagnosis: 311 (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 Caiah has demonstrated progress in her goal as evidenced by increased awareness of hunger patterns and triggers  for emotional eating. Doe also reported a reduction in emotional eating and demonstrates willingness to engage in mindfulness exercises.  Plan: [name] continues to appear able and willing to participate as evidenced by engagement in reciprocal conversation, and asking questions for clarification as appropriate. The next appointment will be scheduled in three weeks, which will be via [employer]. The next session will focus on reviewing learned skills, and working towards the established treatment goal.

## 2019-01-04 ENCOUNTER — Ambulatory Visit (INDEPENDENT_AMBULATORY_CARE_PROVIDER_SITE_OTHER): Payer: 59 | Admitting: Physician Assistant

## 2019-01-04 ENCOUNTER — Encounter (INDEPENDENT_AMBULATORY_CARE_PROVIDER_SITE_OTHER): Payer: Self-pay | Admitting: Physician Assistant

## 2019-01-04 ENCOUNTER — Other Ambulatory Visit: Payer: Self-pay

## 2019-01-04 VITALS — BP 153/82 | HR 70 | Temp 98.6°F | Ht 62.0 in | Wt 234.0 lb

## 2019-01-04 DIAGNOSIS — Z6841 Body Mass Index (BMI) 40.0 and over, adult: Secondary | ICD-10-CM

## 2019-01-04 DIAGNOSIS — E119 Type 2 diabetes mellitus without complications: Secondary | ICD-10-CM

## 2019-01-04 DIAGNOSIS — E559 Vitamin D deficiency, unspecified: Secondary | ICD-10-CM

## 2019-01-04 DIAGNOSIS — Z9189 Other specified personal risk factors, not elsewhere classified: Secondary | ICD-10-CM | POA: Diagnosis not present

## 2019-01-04 MED ORDER — VITAMIN D (ERGOCALCIFEROL) 1.25 MG (50000 UNIT) PO CAPS: ORAL_CAPSULE | ORAL | 0 refills | Status: DC

## 2019-01-04 MED FILL — VIT D2 1.25 MG (50,000 UNIT: 1.25 MG | 28 days supply | Qty: 4 | Fill #0

## 2019-01-05 MED FILL — JARDIANCE 10 MG TABLET: 10 | 30 days supply | Qty: 30 | Fill #3

## 2019-01-05 MED FILL — FREESTYLE LITE TEST STRIP: 90 days supply | Qty: 200 | Fill #0

## 2019-01-05 MED FILL — FREESTYLE LANCETS: 90 days supply | Qty: 200 | Fill #0

## 2019-01-08 NOTE — Progress Notes (Signed)
Office: [phone]  /  Fax: [phone]   HPI:   Chief Complaint: OBESITY Rhonda Aguilar is here to discuss her progress with her obesity treatment plan. She is on the Category 3 plan and is following her eating plan approximately 0 % of the time. She states she is exercising 0 minutes 0 times per week. Rhonda Aguilar reports that she has not been following the plan, due to continuing to work through some stomach upset. She if feeling better and she is trying to eat more food on the plan. Her weight is 234 lb (106.1 kg) today and has had a weight gain of 1 pound over a period of 3 to 4 weeks since her last visit. She has gained 3 lbs since starting treatment with [name].  Vitamin D deficiency Rhonda Aguilar has a diagnosis of vitamin D deficiency. Rhonda Aguilar is currently taking vit D and she denies nausea, vomiting or muscle weakness.  At risk for osteopenia and osteoporosis Rhonda Aguilar is at higher risk of osteopenia and osteoporosis due to vitamin D deficiency.   Diabetes II Rhonda Aguilar has a diagnosis of diabetes type II. She is on Jardiance. Nadra states fasting BGs range between 120 and 140 and she denies any hypoglycemic episodes. She has been working on intensive lifestyle modifications including diet, exercise, and weight loss to help control her blood glucose levels.  ASSESSMENT AND PLAN:  Vitamin D deficiency - Plan: Vitamin D, Ergocalciferol, (DRISDOL) 1.25 MG (50000 UT) CAPS capsule  Type 2 diabetes mellitus without complication, without long-term current use of insulin ([name])  At risk for osteoporosis  Class 3 severe obesity with serious comorbidity and body mass index (BMI) of 40.0 to 44.9 in adult, unspecified obesity type ([place])  PLAN:  Vitamin D Deficiency Rhonda Aguilar was informed that low vitamin D levels contributes to fatigue and are associated with obesity, breast, and colon cancer. Rhonda Aguilar agrees to continue to take prescription Vit D @50 ,000 IU every week #4 with no refills and she will follow up for routine  testing of vitamin D, at least 2-3 times per year. She was informed of the risk of over-replacement of vitamin D and agrees to not increase her dose unless she discusses this with [name] first. Rhonda Aguilar agrees to follow up with our clinic in 3 weeks.  At risk for osteopenia and osteoporosis Rhonda Aguilar was given extended (15 minutes) osteoporosis prevention counseling today. Rhonda Aguilar is at risk for osteopenia and osteoporosis due to her vitamin D deficiency. She was encouraged to take her vitamin D and follow her higher calcium diet and increase strengthening exercise to help strengthen her bones and decrease her risk of osteopenia and osteoporosis.  Diabetes II Rhonda Aguilar has been given extensive diabetes education by myself today including ideal fasting and post-prandial blood glucose readings, individual ideal Hgb A1c goals and hypoglycemia prevention. We discussed the importance of good blood sugar control to decrease the likelihood of diabetic complications such as nephropathy, neuropathy, limb loss, blindness, coronary artery disease, and death. We discussed the importance of intensive lifestyle modification including diet, exercise and weight loss as the first line treatment for diabetes. [name] will continue with medications and weight loss and she will follow up at the agreed upon time.  Obesity Rhonda Aguilar is currently in the action stage of change. As such, her goal is to continue with weight loss efforts She has agreed to follow the Category 3 plan Rhonda Aguilar has been instructed to work up to a goal of 150 minutes of combined cardio and strengthening exercise per week for  weight loss and overall health benefits. We discussed the following Behavioral Modification Strategies today: keeping healthy foods in the home and work on meal planning and easy cooking plans  Rhonda Aguilar has agreed to follow up with our clinic in 3 weeks. She was informed of the importance of frequent follow up visits to maximize her success with intensive  lifestyle modifications for her multiple health conditions.  ALLERGIES: Allergies  Allergen Reactions   Codeine Itching and Rash   Penicillins Rash    MEDICATIONS: Current Outpatient Medications on File Prior to Visit  Medication Sig Dispense Refill   ALPRAZolam (XANAX) 0.5 MG tablet Take 0.25-1 mg by mouth every 8 (eight) hours as needed for anxiety.     busPIRone (BUSPAR) 5 MG tablet Take 1 tablet (5 mg total) by mouth daily. 90 tablet 3   cetirizine (ZYRTEC) 10 MG tablet Take 10 mg by mouth daily.     Coenzyme Q10 (CO Q-10) 300 MG CAPS Take 300 mg by mouth daily. 30 capsule 0   cyclobenzaprine (FLEXERIL) 10 MG tablet Take 10 mg by mouth 3 (three) times daily as needed for muscle spasms.     dexlansoprazole (DEXILANT) 60 MG capsule Take 60 mg by mouth daily.     empagliflozin (JARDIANCE) 10 MG TABS tablet Take 10 mg by mouth daily. 30 tablet 0   ibuprofen (ADVIL,MOTRIN) 600 MG tablet Take 600 mg by mouth every 6 (six) hours as needed for mild pain.     Peppermint Oil (IBGARD PO) Take by mouth.     pravastatin (PRAVACHOL) 10 MG tablet Take 10 mg by mouth daily.     Probiotic Product (ALIGN) 4 MG CAPS Take by mouth.     promethazine (PHENERGAN) 12.5 MG tablet Take 1 tablet (12.5 mg total) by mouth every 8 (eight) hours as needed for nausea, vomiting or refractory nausea / vomiting. 60 tablet 3   valsartan (DIOVAN) 320 MG tablet Take 320 mg by mouth daily.      Verapamil HCl CR 300 MG CP24 Take 300 mg by mouth at bedtime.      No current facility-administered medications on file prior to visit.     PAST MEDICAL HISTORY: Past Medical History:  Diagnosis Date   Benign essential HTN 11/22/2014   Diabetes mellitus, type II ([name])    Fatty liver    GERD (gastroesophageal reflux disease)    Hypertension    IBS (irritable bowel syndrome)    Migraine    Obesity (BMI 30-39.9) 11/22/2014   OSA (obstructive sleep apnea)     PAST SURGICAL HISTORY: Past Surgical  History:  Procedure Laterality Date   COLONOSCOPY     TONSILLECTOMY      SOCIAL HISTORY: Social History   Tobacco Use   Smoking status: Never Smoker   Smokeless tobacco: Never Used  Substance Use Topics   Alcohol use: Yes    Alcohol/week: 0.0 - 1.0 standard drinks   Drug use: Never    FAMILY HISTORY: Family History  Problem Relation Age of Onset   Diabetes Mother    Heart disease Mother    Hypertension Mother    High Cholesterol Mother    Kidney disease Mother    Depression Mother    Obesity Mother    Coronary artery disease Father    High blood pressure Father    High Cholesterol Father    Depression Father    Hypertension Brother     ROS: Review of Systems  Constitutional: Negative for weight loss.  Gastrointestinal: Negative for nausea and vomiting.  Musculoskeletal:       Negative for muscle weakness  Endo/Heme/Allergies:       Negative for hypoglycemia    PHYSICAL EXAM: Blood pressure (!) 153/82, pulse 70, temperature 98.6 F (37 C), temperature source Oral, height 5\' 2"  (1.575 m), weight 234 lb (106.1 kg), SpO2 96 %. Body mass index is 42.8 kg/m. Physical Exam Vitals signs reviewed.  Constitutional:      Appearance: Normal appearance. She is well-developed. She is obese.  Cardiovascular:     Rate and Rhythm: Normal rate.  Pulmonary:     Effort: Pulmonary effort is normal.  Musculoskeletal: Normal range of motion.  Skin:    General: Skin is warm and dry.  Neurological:     Mental Status: She is alert and oriented to person, place, and time.  Psychiatric:        Mood and Affect: Mood normal.        Behavior: Behavior normal.     RECENT LABS AND TESTS: BMET    Component Value Date/Time   NA 138 12/08/2017 1306   K 4.1 12/08/2017 1306   CL 101 12/08/2017 1306   CO2 18 (L) 12/08/2017 1306   GLUCOSE 122 (H) 12/08/2017 1306   BUN 17 12/08/2017 1306   CREATININE 0.73 12/08/2017 1306   CALCIUM 9.2 12/08/2017 1306    GFRNONAA 87 12/08/2017 1306   GFRAA 101 12/08/2017 1306   No results found for: HGBA1C Lab Results  Component Value Date   INSULIN 40.9 (H) 04/12/2018   INSULIN 38.2 (H) 12/08/2017   CBC    Component Value Date/Time   WBC 8.5 12/08/2017 1306   WBC 16.3 (H) 12/19/2014 1120   RBC 4.64 12/08/2017 1306   RBC 4.92 12/19/2014 1120   HGB 12.9 12/08/2017 1306   HCT 38.1 12/08/2017 1306   PLT 263 12/19/2014 1120   MCV 82 12/08/2017 1306   MCH 27.8 12/08/2017 1306   MCH 27.8 12/19/2014 1120   MCHC 33.9 12/08/2017 1306   MCHC 33.5 12/19/2014 1120   RDW 14.0 12/08/2017 1306   LYMPHSABS 2.5 12/08/2017 1306   MONOABS 1.5 (H) 12/19/2014 1120   EOSABS 0.2 12/08/2017 1306   BASOSABS 0.0 12/08/2017 1306   Iron/TIBC/Ferritin/ %Sat No results found for: IRON, TIBC, FERRITIN, IRONPCTSAT Lipid Panel     Component Value Date/Time   CHOL 220 (H) 04/12/2018 1036   TRIG 196 (H) 04/12/2018 1036   HDL 43 04/12/2018 1036   LDLCALC 138 (H) 04/12/2018 1036   Hepatic Function Panel     Component Value Date/Time   PROT 6.7 12/08/2017 1306   ALBUMIN 4.4 12/08/2017 1306   AST 90 (H) 12/08/2017 1306   ALT 118 (H) 12/08/2017 1306   ALKPHOS 86 12/08/2017 1306   BILITOT 0.5 12/08/2017 1306      Component Value Date/Time   TSH 2.550 12/08/2017 1306     Ref. Range 12/11/2018 12:15  Vitamin D, 25-Hydroxy Latest Ref Range: 30.0 - 100.0 ng/mL 27.2 (L)    OBESITY BEHAVIORAL INTERVENTION VISIT  Today's visit was # 24  Starting weight: 231 lbs Starting date: 12/08/2017 Today's weight : 234 lbs Today's date: 01/04/2019 Total lbs lost to date: 0    01/04/2019  Height 5\' 2"  (1.575 m)  Weight 234 lb (106.1 kg)  BMI (Calculated) 42.79  BLOOD PRESSURE - SYSTOLIC 0000000  BLOOD PRESSURE - DIASTOLIC 82   Body Fat % [id number] %  Total Body Water (lbs) 84.8 lbs  ASK: We discussed the diagnosis of obesity with Isaiah Aguilar today and Rhonda Aguilar agreed to give [name] permission to discuss obesity behavioral  modification therapy today.  ASSESS: Rhonda Aguilar has the diagnosis of obesity and her BMI today is 42.79 Rhonda Aguilar is in the action stage of change   ADVISE: Rhonda Aguilar was educated on the multiple health risks of obesity as well as the benefit of weight loss to improve her health. She was advised of the need for long term treatment and the importance of lifestyle modifications to improve her current health and to decrease her risk of future health problems.  AGREE: Multiple dietary modification options and treatment options were discussed and  Rhonda Aguilar agreed to follow the recommendations documented in the above note.  ARRANGE: Rhonda Aguilar was educated on the importance of frequent visits to treat obesity as outlined per CMS and USPSTF guidelines and agreed to schedule her next follow up appointment today.  Corey Skains, am acting as transcriptionist for [name], PA-C I, [name], PA-C have reviewed above note and agree with its content

## 2019-01-11 ENCOUNTER — Encounter (INDEPENDENT_AMBULATORY_CARE_PROVIDER_SITE_OTHER): Payer: Self-pay

## 2019-01-12 MED FILL — VALSARTAN 320 MG TABS: 320 | 30 days supply | Qty: 30 | Fill #4

## 2019-01-15 ENCOUNTER — Other Ambulatory Visit: Payer: Self-pay

## 2019-01-15 ENCOUNTER — Ambulatory Visit (INDEPENDENT_AMBULATORY_CARE_PROVIDER_SITE_OTHER): Payer: 59 | Admitting: Psychology

## 2019-01-15 DIAGNOSIS — F3289 Other specified depressive episodes: Secondary | ICD-10-CM

## 2019-01-19 MED FILL — DEXILANT DR 60 MG CAPSULE: 60 | 30 days supply | Qty: 30 | Fill #2

## 2019-01-19 MED FILL — busPIRone HCL 5 MG TABS: 5 | 90 days supply | Qty: 90 | Fill #2

## 2019-01-22 NOTE — Progress Notes (Unsigned)
Office: [phone]  /  Fax: [phone]    Date: February 05, 2019   Appointment Start Time: *** Duration: *** minutes Provider: Glennie Isle, Psy.D. Type of Session: Individual Therapy  Location of Patient: {gbptloc:23249} Location of Provider: [facility] Type of Contact: Telepsychological Visit via {gbtelepsych:23399}  Session Content: This provider called Merab at 4:02pm as she did not present for the WebEx appointment. *** The e-mail with the secure link was re-sent. As such, today's appointment was initiated *** minutes late.  Rhonda Aguilar is a 63 y.o. female presenting via {gbtelepsych:23399} for a follow-up appointment to address the previously established treatment goal of decreasing emotional eating. Today's appointment was a telepsychological visit due to COVID-19. [name] provided verbal consent for today's telepsychological appointment and she is aware she is responsible for securing confidentiality on her end of the session. Prior to proceeding with today's appointment, [name]'s physical location at the time of this appointment was obtained as well a phone number she could be reached at in the event of technical difficulties. Shiffy and this provider participated in today's telepsychological service.   This provider conducted a brief check-in and verbally administered the PHQ-9 and GAD-7. *** [name] was receptive to today's appointment as evidenced by openness to sharing, responsiveness to feedback, and {gbreceptiveness:23401}.  Mental Status Examination:  Appearance: well groomed and appropriate hygiene  Behavior: appropriate to circumstances Mood: euthymic Affect: mood congruent Speech: normal in rate, volume, and tone Eye Contact: appropriate Psychomotor Activity: appropriate Gait: unable to assess Thought Process: linear, logical, and goal directed  Thought Content/Perception: {disturbances:22451} Orientation: time, person, place and purpose of appointment  Memory/Concentration: memory, attention, language, and fund of knowledge intact  Insight/Judgment: {Insight:22446}  Structured Assessments Results: The Patient Health Questionnaire-9 (PHQ-9) is a self-report measure that assesses symptoms and severity of depression over the course of the last two weeks. [name] obtained a score of *** suggesting {GBPHQ9SEVERITY:21752}. [name] finds the endorsed symptoms to be {gbphq9difficulty:21754}. [0= Not at all; 1= Several days; 2= More than half the days; 3= Nearly every day] [name] in doing things ***  Feeling down, depressed, or hopeless ***  Trouble falling or staying asleep, or sleeping too much ***  Feeling tired or having little energy ***  Poor appetite or overeating ***  Feeling bad about yourself --- or that you are a failure or have let yourself or your family down ***  Trouble concentrating on things, such as reading the newspaper or watching television ***  Moving or speaking so slowly that other people could have noticed? Or the opposite --- being so fidgety or restless that you have been moving around a lot more than usual ***  Thoughts that you would be better off dead or hurting yourself in some way ***  PHQ-9 Score ***    The Generalized Anxiety Disorder-7 (GAD-7) is a brief self-report measure that assesses symptoms of anxiety over the course of the last two weeks. [name] obtained a score of *** suggesting {gbgad7severity:21753}. [name] finds the endorsed symptoms to be {gbphq9difficulty:21754}. [0= Not at all; 1= Several days; 2= Over half the days; 3= Nearly every day] Feeling nervous, anxious, on edge ***  Not being able to stop or control worrying ***  Worrying too much about different things ***  Trouble relaxing ***  Being so restless that it's hard to sit still ***  Becoming easily annoyed or irritable ***  Feeling afraid as if something awful might happen ***  GAD-7 Score ***  Interventions:   {Interventions for Progress Notes:23405}  DSM-5 Diagnosis: 311 (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 [name] has demonstrated progress in her goal as evidenced by {gbtxprogress:22839}. [name] also {gbtxprogress2:22951}.  Plan: The next appointment will be scheduled in {gbweeks:21758}, which will be {gbtxmodality:23402}. The next session will focus on {Plan for Next Appointment:23400}.

## 2019-01-23 ENCOUNTER — Ambulatory Visit: Payer: 59 | Admitting: Physician Assistant

## 2019-01-23 DIAGNOSIS — R1011 Right upper quadrant pain: Secondary | ICD-10-CM | POA: Diagnosis not present

## 2019-01-23 DIAGNOSIS — K760 Fatty (change of) liver, not elsewhere classified: Secondary | ICD-10-CM | POA: Diagnosis not present

## 2019-01-23 DIAGNOSIS — Z86010 Personal history of colonic polyps: Secondary | ICD-10-CM | POA: Diagnosis not present

## 2019-01-23 DIAGNOSIS — K219 Gastro-esophageal reflux disease without esophagitis: Secondary | ICD-10-CM | POA: Diagnosis not present

## 2019-01-24 ENCOUNTER — Other Ambulatory Visit (HOSPITAL_COMMUNITY): Payer: Self-pay | Admitting: Gastroenterology

## 2019-01-24 ENCOUNTER — Ambulatory Visit (INDEPENDENT_AMBULATORY_CARE_PROVIDER_SITE_OTHER): Payer: 59 | Admitting: Family Medicine

## 2019-01-24 ENCOUNTER — Other Ambulatory Visit: Payer: Self-pay | Admitting: Gastroenterology

## 2019-01-24 DIAGNOSIS — R1011 Right upper quadrant pain: Secondary | ICD-10-CM

## 2019-01-30 ENCOUNTER — Other Ambulatory Visit: Payer: Self-pay

## 2019-01-30 ENCOUNTER — Ambulatory Visit (HOSPITAL_COMMUNITY)
Admission: RE | Admit: 2019-01-30 | Discharge: 2019-01-30 | Disposition: A | Discharge status: Home / Self Care | Payer: 59 | Source: Ambulatory Visit | Attending: Gastroenterology | Admitting: Gastroenterology

## 2019-01-30 DIAGNOSIS — K7689 Other specified diseases of liver: Secondary | ICD-10-CM | POA: Diagnosis not present

## 2019-01-30 DIAGNOSIS — R1011 Right upper quadrant pain: Secondary | ICD-10-CM | POA: Diagnosis not present

## 2019-02-01 ENCOUNTER — Other Ambulatory Visit: Payer: Self-pay

## 2019-02-01 ENCOUNTER — Ambulatory Visit (HOSPITAL_COMMUNITY)
Admission: RE | Admit: 2019-02-01 | Discharge: 2019-02-01 | Disposition: A | Discharge status: Home / Self Care | Payer: 59 | Source: Ambulatory Visit | Attending: Gastroenterology | Admitting: Gastroenterology

## 2019-02-01 DIAGNOSIS — R1011 Right upper quadrant pain: Secondary | ICD-10-CM | POA: Insufficient documentation

## 2019-02-01 MED ORDER — TECHNETIUM TC 99M MEBROFENIN IV KIT
5.0600 | PACK | Freq: Once | INTRAVENOUS | Status: AC | PRN
  Administered 2019-02-01: 5.06 via INTRAVENOUS

## 2019-02-03 DIAGNOSIS — G4730 Sleep apnea, unspecified: Secondary | ICD-10-CM | POA: Diagnosis not present

## 2019-02-03 DIAGNOSIS — G4733 Obstructive sleep apnea (adult) (pediatric): Secondary | ICD-10-CM | POA: Diagnosis not present

## 2019-02-05 ENCOUNTER — Telehealth (INDEPENDENT_AMBULATORY_CARE_PROVIDER_SITE_OTHER): Payer: Self-pay | Admitting: Psychology

## 2019-02-05 ENCOUNTER — Ambulatory Visit (INDEPENDENT_AMBULATORY_CARE_PROVIDER_SITE_OTHER): Payer: Self-pay | Admitting: Psychology

## 2019-02-05 NOTE — Telephone Encounter (Signed)
  Office: [phone]  /  Fax: [phone]  Date of Call: February 05, 2019  Time of Call: 4:02pm Provider: Glennie Isle, PsyD  CONTENT: This provider called Rhonda Aguilar to check-in as she did not present for today's Webex appointment at 4:00pm. A HIPAA compliant voicemail was left requesting a call back. Of note, this provider stayed on the WebEx appointment for 5 minutes prior to signing off per the clinic's grace period policy.    PLAN: This provider will wait for [name] to call back. No further follow-up planned by this provider.

## 2019-02-09 MED FILL — VALSARTAN 320 MG TABS: 320 | 30 days supply | Qty: 30 | Fill #5

## 2019-02-14 ENCOUNTER — Encounter (INDEPENDENT_AMBULATORY_CARE_PROVIDER_SITE_OTHER): Payer: Self-pay | Admitting: Physician Assistant

## 2019-02-14 MED FILL — JARDIANCE 10 MG TABLET: 10 | 90 days supply | Qty: 90 | Fill #0

## 2019-02-14 NOTE — Telephone Encounter (Signed)
Please advise labs drawn on 12/11/18 27.2 last appt 01/15/19

## 2019-02-21 MED FILL — DEXILANT DR 60 MG CAPSULE: 60 | 30 days supply | Qty: 30 | Fill #3

## 2019-02-21 MED FILL — VERAPAMIL ER PM 300 MG CAP: 300 | 90 days supply | Qty: 90 | Fill #1

## 2019-02-27 NOTE — Telephone Encounter (Signed)
Let's just wait until I see her tomorrow. Thanks

## 2019-02-28 ENCOUNTER — Ambulatory Visit (INDEPENDENT_AMBULATORY_CARE_PROVIDER_SITE_OTHER): Payer: 59 | Admitting: Physician Assistant

## 2019-02-28 ENCOUNTER — Encounter: Payer: Self-pay | Admitting: Physician Assistant

## 2019-02-28 ENCOUNTER — Encounter (INDEPENDENT_AMBULATORY_CARE_PROVIDER_SITE_OTHER): Payer: Self-pay | Admitting: Physician Assistant

## 2019-02-28 ENCOUNTER — Other Ambulatory Visit: Payer: Self-pay

## 2019-02-28 VITALS — BP 139/70 | HR 89 | Temp 98.1°F | Ht 62.0 in | Wt 233.0 lb

## 2019-02-28 DIAGNOSIS — E559 Vitamin D deficiency, unspecified: Secondary | ICD-10-CM

## 2019-02-28 DIAGNOSIS — E119 Type 2 diabetes mellitus without complications: Secondary | ICD-10-CM | POA: Diagnosis not present

## 2019-02-28 DIAGNOSIS — Z6841 Body Mass Index (BMI) 40.0 and over, adult: Secondary | ICD-10-CM

## 2019-02-28 DIAGNOSIS — Z9189 Other specified personal risk factors, not elsewhere classified: Secondary | ICD-10-CM | POA: Diagnosis not present

## 2019-02-28 DIAGNOSIS — Z634 Disappearance and death of family member: Secondary | ICD-10-CM | POA: Diagnosis not present

## 2019-02-28 DIAGNOSIS — F411 Generalized anxiety disorder: Secondary | ICD-10-CM | POA: Diagnosis not present

## 2019-02-28 MED ORDER — VITAMIN D (ERGOCALCIFEROL) 1.25 MG (50000 UNIT) PO CAPS: ORAL_CAPSULE | ORAL | 0 refills | Status: DC

## 2019-02-28 MED ORDER — ALPRAZOLAM 0.5 MG PO TABS: 0.2500 mg | ORAL_TABLET | Freq: Three times a day (TID) | ORAL | 0 refills | Status: DC | PRN

## 2019-02-28 MED ORDER — BUSPIRONE HCL 5 MG PO TABS: 5.0000 mg | ORAL_TABLET | Freq: Two times a day (BID) | ORAL | 1 refills | Status: DC

## 2019-02-28 MED FILL — busPIRone HCL 5 MG TABS: 5 | 90 days supply | Qty: 180 | Fill #0

## 2019-02-28 MED FILL — VIT D2 1.25 MG (50,000 UNIT: 1.25 MG | 28 days supply | Qty: 4 | Fill #0

## 2019-02-28 MED FILL — ALPRAZolam 0.5 MG TABS: 0.5 | 5 days supply | Qty: 30 | Fill #0

## 2019-02-28 NOTE — Progress Notes (Signed)
[facility] Check  Patient ID: Rhonda Aguilar,  MRN: [id number]  PCP: Glenis Smoker, MD  Date of Evaluation: 02/28/2019 Time spent:15 minutes  Chief Complaint:  Chief Complaint    Anxiety; Follow-up      HISTORY/CURRENT STATUS: HPI  For routine med check.  Her brother died in 03-17-2023 and that's been stressful. Sad but it was expected after having a known aortic dissection for 6 years.  Anxiety is better for the most part. Although she has had more itching in her scalp.  She sometimes gets that when she's more anxious.   Patient denies loss of interest in usual activities and is able to enjoy things.  Denies decreased energy or motivation.  Appetite has not changed.  No extreme sadness, tearfulness, or feelings of hopelessness.  Denies any changes in concentration, making decisions or remembering things.  Denies suicidal or homicidal thoughts.  Denies dizziness, syncope, seizures, numbness, tingling, tremor,  unsteady gait, slurred speech, confusion. Denies muscle or joint pain, stiffness, or dystonia.  Individual Medical History/ Review of Systems: Changes? :No    Past medications for mental health diagnoses include: Pristiq  Allergies: Codeine and Penicillins  Current Medications:  Current Outpatient Medications:  .  busPIRone (BUSPAR) 5 MG tablet, Take 1 tablet (5 mg total) by mouth daily., Disp: 90 tablet, Rfl: 3 .  cetirizine (ZYRTEC) 10 MG tablet, Take 10 mg by mouth daily., Disp: , Rfl:  .  dexlansoprazole (DEXILANT) 60 MG capsule, Take 60 mg by mouth daily., Disp: , Rfl:  .  empagliflozin (JARDIANCE) 10 MG TABS tablet, Take 10 mg by mouth daily., Disp: 30 tablet, Rfl: 0 .  ibuprofen (ADVIL,MOTRIN) 600 MG tablet, Take 600 mg by mouth every 6 (six) hours as needed for mild pain., Disp: , Rfl:  .  Peppermint Oil (IBGARD PO), Take by mouth., Disp: , Rfl:  .  promethazine (PHENERGAN) 12.5 MG tablet, Take 1 tablet (12.5 mg total) by mouth every 8 (eight) hours as  needed for nausea, vomiting or refractory nausea / vomiting., Disp: 60 tablet, Rfl: 3 .  valsartan (DIOVAN) 320 MG tablet, Take 320 mg by mouth daily. , Disp: , Rfl:  .  Verapamil HCl CR 300 MG CP24, Take 300 mg by mouth at bedtime. , Disp: , Rfl:  .  Vitamin D, Ergocalciferol, (DRISDOL) 1.25 MG (50000 UT) CAPS capsule, TAKE 1 CAPSULE BY MOUTH EVERY 7 DAYS, Disp: 4 capsule, Rfl: 0 .  ALPRAZolam (XANAX) 0.5 MG tablet, Take 0.5-2 tablets (0.25-1 mg total) by mouth every 8 (eight) hours as needed for anxiety., Disp: 30 tablet, Rfl: 0 .  Coenzyme Q10 (CO Q-10) 300 MG CAPS, Take 300 mg by mouth daily. (Patient not taking: Reported on 02/28/2019), Disp: 30 capsule, Rfl: 0 .  cyclobenzaprine (FLEXERIL) 10 MG tablet, Take 10 mg by mouth 3 (three) times daily as needed for muscle spasms., Disp: , Rfl:  .  pravastatin (PRAVACHOL) 10 MG tablet, Take 10 mg by mouth daily., Disp: , Rfl:  .  Probiotic Product (ALIGN) 4 MG CAPS, Take by mouth., Disp: , Rfl:  Medication Side Effects: none  Family Medical/ Social History: Changes? Brother died, he was 62. Had   MENTAL HEALTH EXAM:  There were no vitals taken for this visit.There is no height or weight on file to calculate BMI.  General Appearance: Casual, Neat, Well Groomed and Obese  Eye Contact:  Good  Speech:  Clear and Coherent  Volume:  Normal  Mood:  Euthymic  Affect:  Appropriate  Thought Process:  Goal Directed and Descriptions of Associations: Intact  Orientation:  Full (Time, Place, and Person)  Thought Content: Logical   Suicidal Thoughts:  No  Homicidal Thoughts:  No  Memory:  WNL  Judgement:  Good  Insight:  Good  Psychomotor Activity:  Normal  Concentration:  Concentration: Good  Recall:  Good  Fund of Knowledge: Good  Language: Good  Assets:  Desire for Improvement  ADL's:  Intact  Cognition: WNL  Prognosis:  Good    DIAGNOSES:    ICD-10-CM   1. Generalized anxiety disorder  F41.1   2. Bereavement  Z63.4     Receiving  Psychotherapy: No    RECOMMENDATIONS:  Increase Buspar 5 mg to bid, for the increased scratching of her scalp.  She's hesitant, but even if she does it for only 4-6 weeks we should be able to see if it is effective or not.  I recommend higher dose but even at this low dose, it can be effective in some people. Continue Xanax 0.5 mg, qd prn.  (She rarely takes.) Return in 6 weeks.   Donnal Moat, PA-C

## 2019-02-28 NOTE — Progress Notes (Signed)
Office: [phone]  /  Fax: [phone]    Date: March 13, 2019    Appointment Start Time: 10:00am Duration: 28 minutes Provider: Glennie Isle, Psy.D. Type of Session: Individual Therapy  Location of Patient: Home Location of Provider: [facility] Type of Contact: Telepsychological Visit via [employer] Web[employer]  Session Content: Rhonda Aguilar is a 64 y.o. female presenting via [place] for a follow-up appointment to address the previously established treatment goal of decreasing emotional eating. Today's appointment was a telepsychological visit due to COVID-19. Rhonda Aguilar provided verbal consent for today's telepsychological appointment and she is aware she is responsible for securing confidentiality on her end of the session. Prior to proceeding with today's appointment, Rhonda Aguilar's physical location at the time of this appointment was obtained as well a phone number she could be reached at in the event of technical difficulties. Rhonda Aguilar and this provider participated in today's telepsychological service.   This provider conducted a brief check-in and verbally administered the PHQ-9 and GAD-7. Rhonda Aguilar shared about recent events, including her brother's passing. She also shared deviations from the meal plan, adding she is focusing on increase protein and vegetable intake. Psychoeducation regarding the importance of self-care utilizing the oxygen mask metaphor was provided. Psychoeducation regarding pleasurable activities, including its impact on emotional eating and overall well-being was also provided. Rhonda Aguilar was provided with a handout with various options of pleasurable activities, and was encouraged to engage in one activity a day and additional activities as needed when triggered to emotionally eat. Rhonda Aguilar agreed, noting, "I like that." Rhonda Aguilar provided verbal consent during today's appointment for this provider to send a handout with pleasurable activities via e-mail. Moreover, Rhonda Aguilar noted she  met with Rhonda Moat, PA-C, and her Buspar prescription was reportedly increased but resulted in decreased energy. She was also prescribed Xanax PRN. Rhonda Aguilar stated she plans to discontinue the increased dose of Buspar; therefore, it was recommended she inform Ms. Carmon Ginsberg agreed. Furthermore, Rhonda Aguilar indicated she met with her other therapist last week and noted a plan to see her again in two weeks. Overall, Rhonda Aguilar was receptive to today's appointment as evidenced by openness to sharing, responsiveness to feedback, and willingness to engage in pleasurable activities to assist with coping.  Mental Status Examination:  Appearance: well groomed and appropriate hygiene  Behavior: appropriate to circumstances Mood: sad Affect: mood congruent; tearful at times Speech: normal in rate, volume, and tone Eye Contact: appropriate Psychomotor Activity: appropriate Gait: unable to assess Thought Process: linear, logical, and goal directed  Thought Content/Perception: no hallucinations, delusions, bizarre thinking or behavior reported or observed and no evidence of suicidal and homicidal ideation, plan, and intent Orientation: time, person, place and purpose of appointment Memory/Concentration: memory, attention, language, and fund of knowledge intact  Insight/Judgment: good  Structured Assessments Results: The Patient Health Questionnaire-9 (PHQ-9) is a self-report measure that assesses symptoms and severity of depression over the course of the last two weeks. Rhonda Aguilar obtained a score of 8 suggesting mild depression. Rhonda Aguilar finds the endorsed symptoms to be somewhat difficult. [0= Not at all; 1= Several days; 2= More than half the days; 3= Nearly every day] [name] in doing things 1  Feeling down, depressed, or hopeless 1  Trouble falling or staying asleep, or sleeping too much 1  Feeling tired or having little energy 2  Poor appetite or overeating 0  Feeling bad about yourself --- or  that you are a failure or have let yourself or your family down 0  Trouble concentrating on things, such as reading the newspaper or watching television 3  Moving or speaking so slowly that other people could have noticed? Or the opposite --- being so fidgety or restless that you have been moving around a lot more than usual 0  Thoughts that you would be better off dead or hurting yourself in some way 0  PHQ-9 Score 8    The Generalized Anxiety Disorder-7 (GAD-7) is a brief self-report measure that assesses symptoms of anxiety over the course of the last two weeks. Rhonda Aguilar obtained a score of 3 suggesting minimal anxiety. Rhonda Aguilar finds the endorsed symptoms to be not difficult at all. [0= Not at all; 1= Several days; 2= Over half the days; 3= Nearly every day] Feeling nervous, anxious, on edge 0  Not being able to stop or control worrying 1  Worrying too much about different things 0  Trouble relaxing 0  Being so restless that it's hard to sit still 0  Becoming easily annoyed or irritable 1  Feeling afraid as if something awful might happen 1  GAD-7 Score 3   Interventions:  Conducted a brief chart review Verbally administered PHQ-9 and GAD-7 for symptom monitoring Provided empathic reflections and validation Employed supportive psychotherapy interventions to facilitate reduced distress and to improve coping skills with identified stressors Employed motivational interviewing skills to assess patient's willingness/desire to adhere to recommended medical treatments and assignments Psychoeducation provided regarding pleasurable activities Psychoeducation provided regarding self-care  DSM-5 Diagnosis: 311 (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 Rhonda Aguilar has demonstrated progress in her goal as evidenced by increased  awareness of hunger patterns and increased awareness of triggers for emotional eating. Rhonda Aguilar also demonstrates willingness to engage in pleasurable activities.  Plan: The next appointment will be scheduled in two weeks, which will be via [employer]. The next session will focus on working towards the established treatment goal.

## 2019-03-05 NOTE — Progress Notes (Signed)
Chief Complaint:   Rhonda Aguilar is here to discuss her progress with her Rhonda treatment plan along with follow-up of her Rhonda related diagnoses. Rhonda Aguilar is on the Category 3 Plan and states she is following her eating plan approximately 5% of the time. Rhonda Aguilar states she is exercising 0 minutes 0 times per week.  Today's visit was #: 25 Starting weight: 231 lbs Starting date: 12/08/2017 Today's weight: 233 lbs Today's date: 02/28/2019 Total lbs lost to date: 0 Total lbs lost since last in-office visit: 1  Interim History: Rhonda Aguilar reports that she recently had a Hida scan and was told her gallbladder is not working well and she needs to have a cholecystectomy. She is having a lot of abdominal discomfort and she [name] unable to eat on the plan. She had a colonoscopy which was normal.  Subjective:   Vitamin D deficiency  Rhonda Aguilar is on vitamin D and she has no nausea, vomiting or muscle weakness. Her last vitamin D level was 27.2 (12/11/18).  Diabetes II (chronic) Rhonda Aguilar is on Jardiance. She has no UTI's or yeast infections. Her fasting blood sugars average 133. Her last A1c was at 6.8.  At risk for heart disease Rhonda Aguilar is at a higher than average risk for cardiovascular disease due to Rhonda and diabetes. Reviewed: no chest pain on exertion, no dyspnea on exertion, and no swelling of ankles.  Class 3 severe Rhonda with serious comorbidity and body mass index (BMI) of 40.0 to 44.9 in adult, unspecified Rhonda type ([name]) Assessment/Plan:   Vitamin D deficiency Low Vitamin D level contributes to fatigue and are associated with Rhonda, breast, and colon cancer. Rhonda Aguilar agrees to continue to take prescription Vitamin D @50 ,000 IU every week #4 with no refills and she will follow-up for routine testing of vitamin D, at least 2-3 times per year to avoid over-replacement. Rhonda Aguilar needs labs.  Diabetes II (chronic) Rhonda Aguilar has been given diabetes education by myself today. Good blood  sugar control is important to decrease the likelihood of diabetic complications such as nephropathy, neuropathy, limb loss, blindness, coronary artery disease, and death. Intensive lifestyle modification including diet, exercise and weight loss were discussed as the first line treatment for diabetes. Rhonda Aguilar will continue with medications and weight loss. She needs labs.  At risk for heart disease Rhonda Aguilar was given approximately 15 minutes of coronary artery disease prevention counseling today. She is 64 y.o. female and has risk factors for heart disease including Rhonda and diabetes. We discussed intensive lifestyle modifications today with an emphasis on specific weight loss instructions and strategies.   Rhonda Class 3 severe Rhonda with serious comorbidity and body mass index (BMI) of 40.0 to 44.9 in adult, unspecified Rhonda type Rhonda Aguilar) Rhonda Aguilar is currently in the action stage of change. As such, her goal is to continue with weight loss efforts. She has agreed to change to the lower carbohydrate, vegetable and lean protein rich diet plan minus dairy, due to gallbladder).   We discussed the following exercise goals today: Older adults should follow the adult guidelines. When older adults cannot meet the adult guidelines, they should be as physically active as their abilities and conditions will allow.  Older adults should do exercises that maintain or improve balance if they are at risk of falling.   We discussed the following behavioral modification strategies today: meal planning and cooking strategies and keeping healthy foods in the home.  Rhonda Aguilar has agreed to follow-up with our clinic in 2  weeks. She was informed of the importance of frequent follow-up visits to maximize her success with intensive lifestyle modifications for her multiple health conditions.   Objective:   Blood pressure 139/70, pulse 89, temperature 98.1 F (36.7 C), temperature source Oral, height 5\' 2"  (1.575 m),  weight 233 lb (105.7 kg), SpO2 93 %. Body mass index is 42.62 kg/m.  General: Cooperative, alert, well developed, in no acute distress. HEENT: Conjunctivae and lids unremarkable. Neck: No thyromegaly.  Cardiovascular: Regular rhythm.  Lungs: Normal work of breathing. Extremities: No edema.  Neurologic: No focal deficits.   Lab Results  Component Value Date   CREATININE 0.73 12/08/2017   BUN 17 12/08/2017   NA 138 12/08/2017   K 4.1 12/08/2017   CL 101 12/08/2017   CO2 18 (L) 12/08/2017   Lab Results  Component Value Date   ALT 118 (H) 12/08/2017   AST 90 (H) 12/08/2017   ALKPHOS 86 12/08/2017   BILITOT 0.5 12/08/2017   No results found for: HGBA1C Lab Results  Component Value Date   INSULIN 40.9 (H) 04/12/2018   INSULIN 38.2 (H) 12/08/2017   Lab Results  Component Value Date   TSH 2.550 12/08/2017   Lab Results  Component Value Date   CHOL 220 (H) 04/12/2018   HDL 43 04/12/2018   LDLCALC 138 (H) 04/12/2018   TRIG 196 (H) 04/12/2018   Lab Results  Component Value Date   WBC 8.5 12/08/2017   HGB 12.9 12/08/2017   HCT 38.1 12/08/2017   MCV 82 12/08/2017   PLT 263 12/19/2014   No results found for: IRON, TIBC, FERRITIN   Ref. Range 12/11/2018 12:15  Vitamin D, 25-Hydroxy Latest Ref Range: 30.0 - 100.0 ng/mL 27.2 (L)    Attestation Statements:   Reviewed by clinician on day of visit: allergies, medications, problem list, medical history, surgical history, family history, social history, and previous encounter notes.  Corey Skains, am acting as Location manager for [employer], PA-C.  I have reviewed the above documentation for accuracy and completeness, and I agree with the above. [name], PA-C

## 2019-03-08 DIAGNOSIS — F341 Dysthymic disorder: Secondary | ICD-10-CM | POA: Diagnosis not present

## 2019-03-13 ENCOUNTER — Other Ambulatory Visit: Payer: Self-pay

## 2019-03-13 ENCOUNTER — Telehealth (INDEPENDENT_AMBULATORY_CARE_PROVIDER_SITE_OTHER): Payer: 59 | Admitting: Physician Assistant

## 2019-03-13 ENCOUNTER — Encounter (INDEPENDENT_AMBULATORY_CARE_PROVIDER_SITE_OTHER): Payer: Self-pay | Admitting: Physician Assistant

## 2019-03-13 ENCOUNTER — Ambulatory Visit (INDEPENDENT_AMBULATORY_CARE_PROVIDER_SITE_OTHER): Payer: 59 | Admitting: Psychology

## 2019-03-13 DIAGNOSIS — F3289 Other specified depressive episodes: Secondary | ICD-10-CM

## 2019-03-13 DIAGNOSIS — Z6841 Body Mass Index (BMI) 40.0 and over, adult: Secondary | ICD-10-CM | POA: Diagnosis not present

## 2019-03-13 DIAGNOSIS — E7849 Other hyperlipidemia: Secondary | ICD-10-CM

## 2019-03-13 NOTE — Telephone Encounter (Signed)
Please advise 

## 2019-03-13 NOTE — Progress Notes (Signed)
TeleHealth Visit:  Due to the COVID-19 pandemic, this visit was completed with telemedicine (audio/video) technology to reduce patient and provider exposure as well as to preserve personal protective equipment.   Rhonda Aguilar has verbally consented to this TeleHealth visit. The patient is located at home, the provider is located at the [facility]. The participants in this visit include the listed provider and patient and any and all parties involved. The visit was conducted today via WebEx.  Chief Complaint: OBESITY Rhonda Aguilar is here to discuss her progress with her obesity treatment plan along with follow-up of her obesity related diagnoses. Rhonda Aguilar is on the Category 3 Plan and states she is following her eating plan approximately 20% of the time. Rhonda Aguilar states she is exercising 0 minutes 0 times per week.  Today's visit was #: 26 Starting weight: 231 lbs Starting date: 12/08/2017  Interim History: [name] is scheduled to see general surgery next week, regarding possible cholecystectomy. She is trying to get protein in throughout the day that doesn't give her abdominal discomfort.  Subjective:   Other hyperlipidemia Rhonda Aguilar has hyperlipidemia and her last levels of total cholesterol was 220, HDL was 43, LDL was 138 and triglycerides were 196 (04/12/18). She is on Pravastatin. She has been trying to improve her cholesterol levels with intensive lifestyle modification including a low saturated fat diet, exercise and weight loss. She denies any chest pain or myalgias.  Lab Results  Component Value Date   ALT 118 (H) 12/08/2017   AST 90 (H) 12/08/2017   ALKPHOS 86 12/08/2017   BILITOT 0.5 12/08/2017   Lab Results  Component Value Date   CHOL 220 (H) 04/12/2018   HDL 43 04/12/2018   LDLCALC 138 (H) 04/12/2018   TRIG 196 (H) 04/12/2018    Assessment/Plan:   Other hyperlipidemia Cardiovascular risk and specific lipid/LDL goals reviewed.  We discussed several  lifestyle modifications today and Rhonda Aguilar will continue to work on diet, exercise and weight loss efforts. Rhonda Aguilar will continue with medications. Orders and follow up as documented in patient record.   Counseling Intensive lifestyle modifications are the first line treatment for this issue. . Dietary changes: Increase soluble fiber. Decrease simple carbohydrates. . Exercise changes: Moderate to vigorous-intensity aerobic activity 150 minutes per week if tolerated. . Lipid-lowering medications: see documented in medical record.  Obesity Rhonda Aguilar is currently in the action stage of change. As such, her goal is to continue with weight loss efforts. She has agreed to the Category 3 Plan.   Exercise goals: Older adults should follow the adult guidelines. When older adults cannot meet the adult guidelines, they should be as physically active as their abilities and conditions will allow.  Older adults should do exercises that maintain or improve balance if they are at risk of falling.   Behavioral modification strategies: meal planning and cooking strategies and keeping healthy foods in the home.  Rhonda Aguilar has agreed to follow-up with our clinic in 2 weeks. She was informed of the importance of frequent follow-up visits to maximize her success with intensive lifestyle modifications for her multiple health conditions.  Objective:   VITALS: Per patient if applicable, see vitals. GENERAL: Alert and in no acute distress. CARDIOPULMONARY: No increased WOB. Speaking in clear sentences.  PSYCH: Pleasant and cooperative. Speech normal rate and rhythm. Affect is appropriate. Insight and judgement are appropriate. Attention is focused, linear, and appropriate.  NEURO: Oriented as arrived to appointment on time with no prompting.   Lab Results  Component Value Date   CREATININE 0.73 12/08/2017   BUN 17 12/08/2017   NA 138 12/08/2017   K 4.1 12/08/2017   CL 101 12/08/2017   CO2 18 (L) 12/08/2017   Lab Results    Component Value Date   ALT 118 (H) 12/08/2017   AST 90 (H) 12/08/2017   ALKPHOS 86 12/08/2017   BILITOT 0.5 12/08/2017   No results found for: HGBA1C Lab Results  Component Value Date   INSULIN 40.9 (H) 04/12/2018   INSULIN 38.2 (H) 12/08/2017   Lab Results  Component Value Date   TSH 2.550 12/08/2017   Lab Results  Component Value Date   CHOL 220 (H) 04/12/2018   HDL 43 04/12/2018   LDLCALC 138 (H) 04/12/2018   TRIG 196 (H) 04/12/2018   Lab Results  Component Value Date   WBC 8.5 12/08/2017   HGB 12.9 12/08/2017   HCT 38.1 12/08/2017   MCV 82 12/08/2017   PLT 263 12/19/2014   No results found for: IRON, TIBC, FERRITIN   Ref. Range 12/11/2018 12:15  Vitamin D, 25-Hydroxy Latest Ref Range: 30.0 - 100.0 ng/mL 27.2 (L)   Attestation Statements:   Reviewed by clinician on day of visit: allergies, medications, problem list, medical history, surgical history, family history, social history, and previous encounter notes.  Time spent on visit including pre-visit chart review and post-visit care was 31 minutes.   Corey Skains, am acting as Location manager for [employer], PA-C.  I have reviewed the above documentation for accuracy and completeness, and I agree with the above. [name], PA-C

## 2019-03-14 MED FILL — VALSARTAN 320 MG TABLET: 320 | 30 days supply | Qty: 30 | Fill #0

## 2019-03-14 NOTE — Progress Notes (Signed)
  Office: [phone]  /  Fax: [phone]    Date: March 28, 2019   Appointment Start Time: 10:30am Duration: 29 minutes Provider: Glennie Isle, Psy.D. Type of Session: Individual Therapy  Location of Patient: Home Location of Provider: Provider's Home Type of Contact: Telepsychological Visit via [employer] Web[employer]  Session Content: Rhonda Aguilar is a 64 y.o. female presenting via [place] for a follow-up appointment to address the previously established treatment goal of decreasing emotional eating. Today's appointment was a telepsychological visit due to COVID-19. Rhonda Aguilar provided verbal consent for today's telepsychological appointment and she is aware she is responsible for securing confidentiality on her end of the session. Prior to proceeding with today's appointment, Rhonda Aguilar's physical location at the time of this appointment was obtained as well a phone number she could be reached at in the event of technical difficulties. Rhonda Aguilar and this provider participated in today's telepsychological service.   This provider conducted a brief check-in. Rhonda Aguilar shared about recent events, noting a plan to "put off" gallbladder surgery. Regarding eating, she shared, "I've done better overall." This provider explored Rhonda Aguilar's eating habits since the last appointment with this provider. She described eating breakfast and lunch congruent to her meal plan, and she is focusing on increasing protein during dinner. Positive reinforcement was provided. She also reported a reduction in emotional eating, adding she is focusing on portion control and making better choices. Rhonda Aguilar of session focused further on self-care. Rhonda Aguilar discussed engaging in pleasurable activities, noting it has helped increase motivation to complete chores. She was encouraged to continue engaging in pleasurable activities. She agreed. Overall, Rhonda Aguilar was receptive to today's appointment as evidenced by openness to sharing, responsiveness to feedback, and  willingness to engage in learned skills.  Mental Status Examination:  Appearance: well groomed and appropriate hygiene  Behavior: appropriate to circumstances Mood: euthymic Affect: mood congruent Speech: normal in rate, volume, and tone Eye Contact: appropriate Psychomotor Activity: appropriate Gait: unable to assess Thought Process: linear, logical, and goal directed  Thought Content/Perception: no hallucinations, delusions, bizarre thinking or behavior reported or observed and no evidence of suicidal and homicidal ideation, plan, and intent Orientation: time, person, place and purpose of appointment Memory/Concentration: memory, attention, language, and fund of knowledge intact  Insight/Judgment: good  Interventions:  Conducted a brief chart review Provided empathic reflections and validation Reviewed content from the previous session Provided positive reinforcement Employed supportive psychotherapy interventions to facilitate reduced distress and to improve coping skills with identified stressors Employed motivational interviewing skills to assess patient's willingness/desire to adhere to recommended medical treatments and assignments  DSM-5 Diagnosis: 311 (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 Rhonda Aguilar has demonstrated progress in her goal as evidenced by increased awareness of hunger patterns, increased awareness of triggers for emotional eating and reduction in emotional eating. Rhonda Aguilar also continues to demonstrate willingness to engage in learned skill(s).   Plan: Rhonda Aguilar shared she will be out of town in the coming weeks; therefore, the next appointment will be scheduled in 2-3 weeks, which will be via [employer]. The next session will focus on working towards the established treatment goal. In  addition, she shared a plan to schedule an appointment with her other therapist.

## 2019-03-20 DIAGNOSIS — F341 Dysthymic disorder: Secondary | ICD-10-CM | POA: Diagnosis not present

## 2019-03-21 DIAGNOSIS — R109 Unspecified abdominal pain: Secondary | ICD-10-CM | POA: Diagnosis not present

## 2019-03-28 ENCOUNTER — Ambulatory Visit (INDEPENDENT_AMBULATORY_CARE_PROVIDER_SITE_OTHER): Payer: 59 | Admitting: Physician Assistant

## 2019-03-28 ENCOUNTER — Encounter (INDEPENDENT_AMBULATORY_CARE_PROVIDER_SITE_OTHER): Payer: Self-pay | Admitting: Physician Assistant

## 2019-03-28 ENCOUNTER — Other Ambulatory Visit: Payer: Self-pay

## 2019-03-28 ENCOUNTER — Ambulatory Visit (INDEPENDENT_AMBULATORY_CARE_PROVIDER_SITE_OTHER): Payer: 59 | Admitting: Psychology

## 2019-03-28 VITALS — BP 114/80 | HR 81 | Temp 98.3°F | Ht 62.0 in

## 2019-03-28 DIAGNOSIS — Z9189 Other specified personal risk factors, not elsewhere classified: Secondary | ICD-10-CM | POA: Diagnosis not present

## 2019-03-28 DIAGNOSIS — E119 Type 2 diabetes mellitus without complications: Secondary | ICD-10-CM

## 2019-03-28 DIAGNOSIS — E559 Vitamin D deficiency, unspecified: Secondary | ICD-10-CM

## 2019-03-28 DIAGNOSIS — F3289 Other specified depressive episodes: Secondary | ICD-10-CM | POA: Diagnosis not present

## 2019-03-28 DIAGNOSIS — Z6841 Body Mass Index (BMI) 40.0 and over, adult: Secondary | ICD-10-CM

## 2019-03-28 MED ORDER — VITAMIN D (ERGOCALCIFEROL) 1.25 MG (50000 UNIT) PO CAPS: ORAL_CAPSULE | ORAL | 0 refills | Status: DC

## 2019-03-28 MED FILL — VIT D2 1.25 MG (50,000 UNIT: 1.25 MG | 28 days supply | Qty: 4 | Fill #0

## 2019-03-28 NOTE — Progress Notes (Signed)
Chief Complaint:   OBESITY Rhonda Aguilar is here to discuss her progress with her obesity treatment plan along with follow-up of her obesity related diagnoses. [name] is on the Category 3 Plan and states she is following her eating plan approximately 15% of the time. [name] states she is exercising for 0 minutes 0 times per week.  Today's visit was #: 27 Starting weight: 231 lbs Starting date: 12/08/2017 Today's weight: 233 lbs Today's date: 03/28/2019 Total lbs lost to date: 0 Total lbs lost since last in-office visit: 0  Interim History: Rhonda Aguilar continues to have abdominal discomfort due to a gallbladder issue.  She recently saw a surgeon who advised that she can have her gallbladder removed whenever she is ready.  Subjective:   1. Vitamin D deficiency Rhonda Aguilar's Vitamin D level was 27.2 on 12/11/2018. She is currently taking vit D. She denies nausea, vomiting or muscle weakness.  2. Type 2 diabetes mellitus without complication, without long-term current use of insulin ([name]) Rhonda Aguilar is taking Jardiance.  No UTIs or yeast infections.  Her last A1c was 6.5.  No hypoglycemia.  Lab Results  Component Value Date   LDLCALC 138 (H) 04/12/2018   CREATININE 0.73 12/08/2017   Lab Results  Component Value Date   INSULIN 40.9 (H) 04/12/2018   INSULIN 38.2 (H) 12/08/2017   3. At risk for heart disease Rhonda Aguilar is at a higher than average risk for cardiovascular disease due to obesity. Reviewed: no chest pain on exertion, no dyspnea on exertion, and no swelling of ankles.  Assessment/Plan:   1. Vitamin D deficiency Low Vitamin D level contributes to fatigue and are associated with obesity, breast, and colon cancer. She agrees to continue to take prescription Vitamin D @50 ,000 IU every week and will follow-up for routine testing of Vitamin D, at least 2-3 times per year to avoid over-replacement. - Vitamin D, Ergocalciferol, (DRISDOL) 1.25 MG (50000 UNIT) CAPS capsule; TAKE 1 CAPSULE BY MOUTH EVERY 7  DAYS  Dispense: 4 capsule; Refill: 0  2. Type 2 diabetes mellitus without complication, without long-term current use of insulin ([name]) Low Vitamin D level contributes to fatigue and are associated with obesity, breast, and colon cancer. She agrees to continue to take prescription Vitamin D @50 ,000 IU every week and will follow-up for routine testing of Vitamin D, at least 2-3 times per year to avoid over-replacement.  3. At risk for heart disease Rhonda Aguilar was given approximately 15 minutes of coronary artery disease prevention counseling today. She is 64 y.o. female and has risk factors for heart disease including obesity. We discussed intensive lifestyle modifications today with an emphasis on specific weight loss instructions and strategies.   Repetitive spaced learning was employed today to elicit superior memory formation and behavioral change.  4. Class 3 severe obesity with serious comorbidity and body mass index (BMI) of 40.0 to 44.9 in adult, unspecified obesity type [facility]) Rhonda Aguilar is currently in the action stage of change. As such, her goal is to continue with weight loss efforts. She has agreed to the [employer] +300 snack calories.   Exercise goals: Older adults should follow the adult guidelines. When older adults cannot meet the adult guidelines, they should be as physically active as their abilities and conditions will allow.  Older adults should do exercises that maintain or improve balance if they are at risk of falling.   Behavioral modification strategies: decreasing simple carbohydrates and meal planning and cooking strategies.  Rhonda Aguilar has agreed to follow-up with our  clinic in 3 weeks. She was informed of the importance of frequent follow-up visits to maximize her success with intensive lifestyle modifications for her multiple health conditions.   Objective:   Blood pressure 114/80, pulse 81, temperature 98.3 F (36.8 C), temperature source Oral, height 5\' 2"  (1.575 m), SpO2  95 %. Body mass index is 42.62 kg/m.  General: Cooperative, alert, well developed, in no acute distress. HEENT: Conjunctivae and lids unremarkable. Cardiovascular: Regular rhythm.  Lungs: Normal work of breathing. Neurologic: No focal deficits.   Lab Results  Component Value Date   CREATININE 0.73 12/08/2017   BUN 17 12/08/2017   NA 138 12/08/2017   K 4.1 12/08/2017   CL 101 12/08/2017   CO2 18 (L) 12/08/2017   Lab Results  Component Value Date   ALT 118 (H) 12/08/2017   AST 90 (H) 12/08/2017   ALKPHOS 86 12/08/2017   BILITOT 0.5 12/08/2017   No results found for: HGBA1C Lab Results  Component Value Date   INSULIN 40.9 (H) 04/12/2018   INSULIN 38.2 (H) 12/08/2017   Lab Results  Component Value Date   TSH 2.550 12/08/2017   Lab Results  Component Value Date   CHOL 220 (H) 04/12/2018   HDL 43 04/12/2018   LDLCALC 138 (H) 04/12/2018   TRIG 196 (H) 04/12/2018   Lab Results  Component Value Date   WBC 8.5 12/08/2017   HGB 12.9 12/08/2017   HCT 38.1 12/08/2017   MCV 82 12/08/2017   PLT 263 12/19/2014   Attestation Statements:   Reviewed by clinician on day of visit: allergies, medications, problem list, medical history, surgical history, family history, social history, and previous encounter notes.  I, Water quality scientist, CMA, am acting as Location manager for [employer], PA-C.  I have reviewed the above documentation for accuracy and completeness, and I agree with the above. [name], PA-C

## 2019-03-29 MED FILL — DEXILANT DR 60 MG CAPSULE: 60 | 30 days supply | Qty: 30 | Fill #4

## 2019-04-02 NOTE — Progress Notes (Signed)
Office: [phone]  /  Fax: [phone]    Date: April 16, 2019   Appointment Start Time: 8:00am Duration: 31 minutes Provider: Glennie Isle, Psy.D. Type of Session: Individual Therapy  Location of Patient: Home Location of Provider: Provider's Home Type of Contact: Telepsychological Visit via [employer] Web[employer]  Session Content: Rhonda Aguilar is a 64 y.o. female presenting via [place] for a follow-up appointment to address the previously established treatment goal of decreasing emotional eating. Today's appointment was a telepsychological visit due to COVID-19. Rhonda Aguilar provided verbal consent for today's telepsychological appointment and she is aware she is responsible for securing confidentiality on her end of the session. Prior to proceeding with today's appointment, Cyndia's physical location at the time of this appointment was obtained as well a phone number she could be reached at in the event of technical difficulties. Rhonda Aguilar and this provider participated in today's telepsychological service.   This provider conducted a brief check-in and verbally administered the PHQ-9 and GAD-7. Rhonda Aguilar shared she was on vacation last week and also discussed other recent events. Regarding eating, she acknowledged eating out, but described making better choices. She expressed desire to discuss deviations from the meal plan. Thus, this provider explored Rhonda Aguilar's motivation/goals that initially brought her to the clinic. Rhonda Aguilar reported medical concerns, pain and increased fatigue were motivators, adding she is starting to experiencing physical discomfort again. This provider and Rhonda Aguilar also explored her current eating habits to determine areas where changes can be made. It was reflected she is not consuming enough protein and she acknowledged she is not drinking enough water. As such, psychoeducation regarding all or nothing thinking and SMART goals was provided. This provider assisted Rhonda Aguilar in establishing a SMART goal  for one meal to work towards to increase eating congruent to the meal plan. The following goal was established: Rhonda Aguilar will eat lunch congruent to her meal plan 3 out of 7 days until her next appointment with this provider. Furthermore, termination planning was discussed as Rhonda Aguilar described an overall reduction in emotional eating. Rhonda Aguilar was receptive to a follow-up appointment in 3-4 weeks and an additional follow-up/termination appointment in 3-4 weeks after that. She stated she spoke with her other therapist 2 weeks ago, noting she has to make her next appointment today. Rhonda Aguilar was receptive to today's appointment as evidenced by openness to sharing, responsiveness to feedback, and willingness to work toward the established goal.  Mental Status Examination:  Appearance: well groomed and appropriate hygiene  Behavior: appropriate to circumstances Mood: euthymic Affect: mood congruent Speech: normal in rate, volume, and tone Eye Contact: appropriate Psychomotor Activity: appropriate Gait: unable to assess Thought Process: linear, logical, and goal directed  Thought Content/Perception: no hallucinations, delusions, bizarre thinking or behavior reported or observed and no evidence of suicidal and homicidal ideation, plan, and intent Orientation: time, person, place and purpose of appointment Memory/Concentration: memory, attention, language, and fund of knowledge intact  Insight/Judgment: good  Structured Assessments Results: The Patient Health Questionnaire-9 (PHQ-9) is a self-report measure that assesses symptoms and severity of depression over the course of the last two weeks. Rhonda Aguilar obtained a score of 1 suggesting minimal depression. Rhonda Aguilar finds the endorsed symptoms to be not difficult at all. [0= Not at all; 1= Several days; 2= More than half the days; 3= Nearly every day] [name] in doing things 0  Feeling down, depressed, or hopeless 0  Trouble falling or staying asleep,  or sleeping too much 0  Feeling tired or having little energy 0  Poor appetite or overeating 0  Feeling bad about yourself --- or that you are a failure or have let yourself or your family down 1  Trouble concentrating on things, such as reading the newspaper or watching television 0  Moving or speaking so slowly that other people could have noticed? Or the opposite --- being so fidgety or restless that you have been moving around a lot more than usual 0  Thoughts that you would be better off dead or hurting yourself in some way 0  PHQ-9 Score 1    The Generalized Anxiety Disorder-7 (GAD-7) is a brief self-report measure that assesses symptoms of anxiety over the course of the last two weeks. Rhonda Aguilar obtained a score of 0. [0= Not at all; 1= Several days; 2= Over half the days; 3= Nearly every day] Feeling nervous, anxious, on edge 0  Not being able to stop or control worrying 0  Worrying too much about different things 0  Trouble relaxing 0  Being so restless that it's hard to sit still 0  Becoming easily annoyed or irritable 0  Feeling afraid as if something awful might happen 0  GAD-7 Score 0   Interventions:  Conducted a brief chart review Verbally administered PHQ-9 and GAD-7 for symptom monitoring Provided empathic reflections and validation Employed supportive psychotherapy interventions to facilitate reduced distress and to improve coping skills with identified stressors Employed motivational interviewing skills to assess patient's willingness/desire to adhere to recommended medical treatments and assignments Psychoeducation provided regarding all-or-nothing thinking Discussed termination planning Psychoeducation provided regarding SMART goals  Engaged patient in goal setting   DSM-5 Diagnosis: 311 (F32.8) Other Specified Depressive Disorder, Emotional Eating Behaviors  Treatment Goal & Progress: During the initial appointment with this provider, the following treatment goal  was established: decrease emotional eating. Rhonda Aguilar has demonstrated progress in her goal as evidenced by increased awareness of hunger patterns, increased awareness of triggers for emotional eating and reduction in emotional eating. Rhonda Aguilar also continues to demonstrate willingness to engage in learned skill(s).  Plan: The next appointment will be scheduled in three weeks, which will be via [employer]. The next session will focus on working towards the established treatment goal.

## 2019-04-06 MED FILL — busPIRone HCL 5 MG TABS: 5 | 90 days supply | Qty: 180 | Fill #0

## 2019-04-10 MED FILL — VALSARTAN 320 MG TABLET: 320 | 30 days supply | Qty: 30 | Fill #1

## 2019-04-16 ENCOUNTER — Other Ambulatory Visit: Payer: Self-pay

## 2019-04-16 ENCOUNTER — Ambulatory Visit (INDEPENDENT_AMBULATORY_CARE_PROVIDER_SITE_OTHER): Payer: 59 | Admitting: Psychology

## 2019-04-16 DIAGNOSIS — F5089 Other specified eating disorder: Secondary | ICD-10-CM

## 2019-04-16 DIAGNOSIS — F3289 Other specified depressive episodes: Secondary | ICD-10-CM

## 2019-04-19 ENCOUNTER — Ambulatory Visit (INDEPENDENT_AMBULATORY_CARE_PROVIDER_SITE_OTHER): Payer: 59 | Admitting: Physician Assistant

## 2019-04-19 ENCOUNTER — Encounter (INDEPENDENT_AMBULATORY_CARE_PROVIDER_SITE_OTHER): Payer: Self-pay | Admitting: Physician Assistant

## 2019-04-19 ENCOUNTER — Other Ambulatory Visit: Payer: Self-pay

## 2019-04-19 ENCOUNTER — Ambulatory Visit (INDEPENDENT_AMBULATORY_CARE_PROVIDER_SITE_OTHER): Payer: Self-pay | Admitting: Psychology

## 2019-04-19 ENCOUNTER — Encounter: Payer: Self-pay | Admitting: Physician Assistant

## 2019-04-19 VITALS — BP 131/71 | HR 81 | Temp 97.6°F | Ht 62.0 in | Wt 234.0 lb

## 2019-04-19 DIAGNOSIS — Z634 Disappearance and death of family member: Secondary | ICD-10-CM

## 2019-04-19 DIAGNOSIS — E7849 Other hyperlipidemia: Secondary | ICD-10-CM

## 2019-04-19 DIAGNOSIS — E559 Vitamin D deficiency, unspecified: Secondary | ICD-10-CM | POA: Diagnosis not present

## 2019-04-19 DIAGNOSIS — F331 Major depressive disorder, recurrent, moderate: Secondary | ICD-10-CM | POA: Diagnosis not present

## 2019-04-19 DIAGNOSIS — Z9189 Other specified personal risk factors, not elsewhere classified: Secondary | ICD-10-CM

## 2019-04-19 DIAGNOSIS — F411 Generalized anxiety disorder: Secondary | ICD-10-CM | POA: Diagnosis not present

## 2019-04-19 DIAGNOSIS — E119 Type 2 diabetes mellitus without complications: Secondary | ICD-10-CM | POA: Diagnosis not present

## 2019-04-19 DIAGNOSIS — Z6841 Body Mass Index (BMI) 40.0 and over, adult: Secondary | ICD-10-CM | POA: Diagnosis not present

## 2019-04-19 DIAGNOSIS — G4733 Obstructive sleep apnea (adult) (pediatric): Secondary | ICD-10-CM

## 2019-04-19 MED ORDER — VITAMIN D (ERGOCALCIFEROL) 1.25 MG (50000 UNIT) PO CAPS: ORAL_CAPSULE | ORAL | 0 refills | Status: DC

## 2019-04-19 MED ORDER — BUSPIRONE HCL 5 MG PO TABS: ORAL_TABLET | ORAL | 1 refills | Status: DC

## 2019-04-19 MED FILL — VIT D2 1.25 MG (50,000 UNIT: 1.25 MG | 28 days supply | Qty: 4 | Fill #0

## 2019-04-19 NOTE — Progress Notes (Signed)
[facility]  Patient ID: Rhonda Aguilar,  MRN: [id number]  PCP: Glenis Smoker, MD  Date of Evaluation: 04/19/2019 Time spent:20 minutes  Chief Complaint:  Chief Complaint    Depression; Anxiety      HISTORY/CURRENT STATUS: HPI  For med f/u.  She increased the Buspar at the last visit 6 weeks ago.  It made her too sleepy so she had to go back to 5mg  qd.  She tried taking 10 mg at night and still had drowiness.  States that she continues to scratch and pick at her head a lot.  That has not really changed.  She did seem to feel a little bit better when she was on the higher dose of BuSpar but she just could not tolerate it.  She's grieving her brother's passing but it is not constant.  It comes in waves, and although she gets sad, she does think about the good times and good memory she has.  Has low energy at times and low motivation.  She can fall asleep easily on the couch for example.  She does enjoy things however.  She went to the beach a few weeks ago.  Enjoyed that and being around her sister-in-law and her grandchildren.  No suicidal or homicidal thoughts.  Denies dizziness, syncope, seizures, numbness, tingling, tremor, tics, unsteady gait, slurred speech, confusion. Denies muscle or joint pain, stiffness, or dystonia.  Individual Medical History/ Review of Systems: Changes? :Yes Hurt her back somehow.  She will be seeing her PCP tomorrow.  Past medications for mental health diagnoses include: Pristiq, Zoloft caused her to be more depressed.  Allergies: Codeine and Penicillins  Current Medications:  Current Outpatient Medications:  .  ALPRAZolam (XANAX) 0.5 MG tablet, Take 0.5-2 tablets (0.25-1 mg total) by mouth every 8 (eight) hours as needed for anxiety., Disp: 30 tablet, Rfl: 0 .  busPIRone (BUSPAR) 5 MG tablet, 1/2 q am, 1 po qhs., Disp: 180 tablet, Rfl: 1 .  cetirizine (ZYRTEC) 10 MG tablet, Take 10 mg by mouth daily., Disp: , Rfl:  .   dexlansoprazole (DEXILANT) 60 MG capsule, Take 60 mg by mouth daily., Disp: , Rfl:  .  empagliflozin (JARDIANCE) 10 MG TABS tablet, Take 10 mg by mouth daily., Disp: 30 tablet, Rfl: 0 .  ibuprofen (ADVIL,MOTRIN) 600 MG tablet, Take 600 mg by mouth every 6 (six) hours as needed for mild pain., Disp: , Rfl:  .  Peppermint Oil (IBGARD PO), Take by mouth., Disp: , Rfl:  .  promethazine (PHENERGAN) 12.5 MG tablet, Take 1 tablet (12.5 mg total) by mouth every 8 (eight) hours as needed for nausea, vomiting or refractory nausea / vomiting., Disp: 60 tablet, Rfl: 3 .  valsartan (DIOVAN) 320 MG tablet, Take 320 mg by mouth daily. , Disp: , Rfl:  .  Verapamil HCl CR 300 MG CP24, Take 300 mg by mouth at bedtime. , Disp: , Rfl:  .  Vitamin D, Ergocalciferol, (DRISDOL) 1.25 MG (50000 UNIT) CAPS capsule, TAKE 1 CAPSULE BY MOUTH EVERY 7 DAYS, Disp: 4 capsule, Rfl: 0 .  cyclobenzaprine (FLEXERIL) 10 MG tablet, Take 10 mg by mouth 3 (three) times daily as needed for muscle spasms., Disp: , Rfl:  .  pravastatin (PRAVACHOL) 10 MG tablet, Take 10 mg by mouth daily., Disp: , Rfl:  .  Probiotic Product (ALIGN) 4 MG CAPS, Take by mouth., Disp: , Rfl:  Medication Side Effects: none  Family Medical/ Social History: Changes? No  MENTAL HEALTH EXAM:  There were no vitals taken for this visit.There is no height or weight on file to calculate BMI.  General Appearance: Casual, Neat, Well Groomed and Obese  Eye Contact:  Good  Speech:  Clear and Coherent  Volume:  Normal  Mood:  Euthymic  Affect:  Appropriate  Thought Process:  Goal Directed and Descriptions of Associations: Intact  Orientation:  Full (Time, Place, and Person)  Thought Content: Logical   Suicidal Thoughts:  No  Homicidal Thoughts:  No  Memory:  WNL  Judgement:  Good  Insight:  Good  Psychomotor Activity:  Normal  Concentration:  Concentration: Good  Recall:  Good  Fund of Knowledge: Good  Language: Good  Assets:  Desire for Improvement  ADL's:   Intact  Cognition: WNL  Prognosis:  Good    DIAGNOSES:    ICD-10-CM   1. Generalized anxiety disorder  F41.1   2. Major depressive disorder, recurrent episode, moderate ([name])  F33.1   3. Bereavement  Z63.4   4. Obstructive sleep apnea  G47.33     Receiving Psychotherapy: No    RECOMMENDATIONS:  I spent 20 minutes with her. PDMP was reviewed. We discussed whether she should be on an antidepressant or not, for depression but also to help prevent the anxiety.  She seems to be very sensitive to medications, even at low doses, she can have side effects.  She would prefer not to start another medicine if at all possible.  We discussed Luvox and will consider that if her lack of motivation and energy do not improve soon.  A lot of what she feels is the normal grieving process.   Increase the Buspar to 5mg  qhs and 2.5mg  q am.  She can increase as tolerated. Cont Xanax 0.5mg , 1/2-1 po qd prn.  Consider therapy. Return in 6 weeks.  Donnal Moat, PA-C

## 2019-04-19 NOTE — Progress Notes (Signed)
Office: [phone]  /  Fax: [phone]    Date: May 03, 2019   Appointment Start Time: 8:00am Duration: 30 minutes Provider: Glennie Isle, Psy.D. Type of Session: Individual Therapy  Location of Patient: Home Location of Provider: Provider's Home Type of Contact: Telepsychological Visit via [employer] Web[employer]  Session Content: Rhonda Aguilar is a 64 y.o. female presenting via [place] for a follow-up appointment to address the previously established treatment goal of decreasing emotional eating. Today's appointment was a telepsychological visit due to COVID-19. Rhonda Aguilar provided verbal consent for today's telepsychological appointment and she is aware she is responsible for securing confidentiality on her end of the session. Prior to proceeding with today's appointment, Rhonda Aguilar's physical location at the time of this appointment was obtained as well a phone number she could be reached at in the event of technical difficulties. Rhonda Aguilar and this provider participated in today's telepsychological service.   This provider conducted a brief check-in. Rhonda Aguilar shared about recent events, including recent back pain and an upcoming trip to the beach. Regarding eating, Rhonda Aguilar noted, "I am doing better with the diet." She reported meeting her previously established goal for lunch. Positive reinforcement was provided. To further assist with eating congruent to the meal plan while at work, it was recommended she write a check list to consult every morning before leaving to work. She agreed. In addition, Rhonda Aguilar's dinner habits were explored. Rhonda Aguilar described "craving carbs" for dinner. It was reflected adding carbohydrates to the dinner meal likely results in consumption of additional calories; therefore, she was encouraged to use her snack calories as needed.To increase eating congruent to the meal plan, goals for lunch (5 out of 7 days congruent to the meal plan) and dinner (4 out of 7 days congruent to the meal plan) were  established. She noted having goals has helped her get back on track. Furthermore, she continues to report a reduction in emotional eating. Overall, Rhonda Aguilar was receptive to today's appointment as evidenced by openness to sharing, responsiveness to feedback, and willingness to work toward the established goals.  Mental Status Examination:  Appearance: well groomed and appropriate hygiene  Behavior: appropriate to circumstances Mood: euthymic Affect: mood congruent Speech: normal in rate, volume, and tone Eye Contact: appropriate Psychomotor Activity: appropriate Gait: unable to assess Thought Process: linear, logical, and goal directed  Thought Content/Perception: no hallucinations, delusions, bizarre thinking or behavior reported or observed and no evidence of suicidal and homicidal ideation, plan, and intent Orientation: time, person, place and purpose of appointment Memory/Concentration: memory, attention, language, and fund of knowledge intact  Insight/Judgment: good  Interventions:  Conducted a brief chart review Provided empathic reflections and validation Reviewed content from the previous session Provided positive reinforcement Employed supportive psychotherapy interventions to facilitate reduced distress and to improve coping skills with identified stressors Employed motivational interviewing skills to assess patient's willingness/desire to adhere to recommended medical treatments and assignments Engaged patient in goal setting  DSM-5 Diagnosis(es): 311 (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 Rhonda Aguilar has demonstrated progress in her goal as evidenced by increased awareness of hunger patterns, increased awareness of triggers for emotional eating and reduction in emotional eating. Rhonda Aguilar also continues to  demonstrate willingness to engage in learned skill(s).  Plan: The next appointment will be scheduled in one month, which will be a telepsychological visit. The next session will focus on reviewing learned skills and termination.

## 2019-04-19 NOTE — Addendum Note (Signed)
Addended by: Wilfrid Lund on: 04/19/2019 02:23 PM   Modules accepted: Orders

## 2019-04-19 NOTE — Progress Notes (Signed)
Chief Complaint:   OBESITY Rhonda Aguilar is here to discuss her progress with her obesity treatment plan along with follow-up of her obesity related diagnoses. [name] is on the Category 3 Plan and states she is following her eating plan approximately 50% of the time. [name] states she is exercising 0 minutes 0 times per week.  Today's visit was #: 28 Starting weight: 231 lbs Starting date: 12/08/2017 Today's weight: 234 lbs Today's date: 04/19/2019 Total lbs lost to date: 0 Total lbs lost since last in-office visit: 0  Interim History: [name] reports that she went to the beach recently and did not eat fully on plan. She continues to have some stomach discomfort and has chosen to wait to schedule a cholecystectomy.  Subjective:   Other hyperlipidemia. [name] was on pravastatin, but stopped due to "aches and pains." She hasn't taken it for the last 5 months. She is due for labs.   Lab Results  Component Value Date   CHOL 220 (H) 04/12/2018   HDL 43 04/12/2018   LDLCALC 138 (H) 04/12/2018   TRIG 196 (H) 04/12/2018   Lab Results  Component Value Date   ALT 118 (H) 12/08/2017   AST 90 (H) 12/08/2017   ALKPHOS 86 12/08/2017   BILITOT 0.5 12/08/2017   The 10-year ASCVD risk score [name] DC Jr., et al., 2013) is: 16.8%   Values used to calculate the score:     Age: 62 years     Sex: Female     Is Non-Hispanic African American: No     Diabetic: Yes     Tobacco smoker: No     Systolic Blood Pressure: [id number] mmHg     Is BP treated: Yes     HDL Cholesterol: 43 mg/dL     Total Cholesterol: 220 mg/dL  Vitamin D deficiency. [name] is on prescription Vitamin D weekly. No nausea, vomiting, or muscle weakness. Last Vitamin D level was not at goal at 27.2 on 12/11/2018.  She is due for labs.  Type 2 diabetes mellitus without complication, without long-term current use of insulin ([place]). [name] is on Jardiance. She reports her blood sugars have been more elevated than normal. She is due for  labs.  No results found for: HGBA1C Lab Results  Component Value Date   LDLCALC 138 (H) 04/12/2018   CREATININE 0.73 12/08/2017   Lab Results  Component Value Date   INSULIN 40.9 (H) 04/12/2018   INSULIN 38.2 (H) 12/08/2017   At risk for heart disease. [name] is at a higher than average risk for cardiovascular disease due to obesity. Reviewed: no chest pain on exertion, no dyspnea on exertion, and no swelling of ankles.  Assessment/Plan:   Other hyperlipidemia. Cardiovascular risk and specific lipid/LDL goals reviewed.  We discussed several lifestyle modifications today and [name] will continue to work on diet, exercise and weight loss efforts. Orders and follow up as documented in patient record. Labs will be checked today.  Counseling Intensive lifestyle modifications are the first line treatment for this issue. . Dietary changes: Increase soluble fiber. Decrease simple carbohydrates. . Exercise changes: Moderate to vigorous-intensity aerobic activity 150 minutes per week if tolerated. . Lipid-lowering medications: see documented in medical record.  Vitamin D deficiency. Low Vitamin D level contributes to fatigue and are associated with obesity, breast, and colon cancer. She was given a refill on her Vitamin D, Ergocalciferol, (DRISDOL) 1.25 MG (50000 UNIT) CAPS capsule every week #4 with 0 refills and will follow-up for  routine testing of Vitamin D, at least 2-3 times per year to avoid over-replacement.   Type 2 diabetes mellitus without complication, without long-term current use of insulin ([place]). Good blood sugar control is important to decrease the likelihood of diabetic complications such as nephropathy, neuropathy, limb loss, blindness, coronary artery disease, and death. Intensive lifestyle modification including diet, exercise and weight loss are the first line of treatment for diabetes. [name] will continue medications as directed. Labs will be checked today.  At risk for heart  disease. [name] was given approximately 15 minutes of coronary artery disease prevention counseling today. She is 64 y.o. female and has risk factors for heart disease including obesity. We discussed intensive lifestyle modifications today with an emphasis on specific weight loss instructions and strategies.   Repetitive spaced learning was employed today to elicit superior memory formation and behavioral change.  Class 3 severe obesity with serious comorbidity and body mass index (BMI) of 40.0 to 44.9 in adult, unspecified obesity type ([place]).  [name] is currently in the action stage of change. As such, her goal is to continue with weight loss efforts. She has agreed to the Category 3 Plan.   Exercise goals: Older adults should follow the adult guidelines. When older adults cannot meet the adult guidelines, they should be as physically active as their abilities and conditions will allow.   Behavioral modification strategies: no skipping meals and meal planning and cooking strategies.  [name] has agreed to follow-up with our clinic in 2 weeks. She was informed of the importance of frequent follow-up visits to maximize her success with intensive lifestyle modifications for her multiple health conditions.   [name] was informed we would discuss her lab results at her next visit unless there is a critical issue that needs to be addressed sooner. [name] agreed to keep her next visit at the agreed upon time to discuss these results.  Objective:   Blood pressure 131/71, pulse 81, temperature 97.6 F (36.4 C), height 5\' 2"  (1.575 m), weight 234 lb (106.1 kg), SpO2 96 %. Body mass index is 42.8 kg/m.  General: Cooperative, alert, well developed, in no acute distress. HEENT: Conjunctivae and lids unremarkable. Cardiovascular: Regular rhythm.  Lungs: Normal work of breathing. Neurologic: No focal deficits.   Lab Results  Component Value Date   CREATININE 0.73 12/08/2017   BUN 17 12/08/2017   NA 138  12/08/2017   K 4.1 12/08/2017   CL 101 12/08/2017   CO2 18 (L) 12/08/2017   Lab Results  Component Value Date   ALT 118 (H) 12/08/2017   AST 90 (H) 12/08/2017   ALKPHOS 86 12/08/2017   BILITOT 0.5 12/08/2017   No results found for: HGBA1C Lab Results  Component Value Date   INSULIN 40.9 (H) 04/12/2018   INSULIN 38.2 (H) 12/08/2017   Lab Results  Component Value Date   TSH 2.550 12/08/2017   Lab Results  Component Value Date   CHOL 220 (H) 04/12/2018   HDL 43 04/12/2018   LDLCALC 138 (H) 04/12/2018   TRIG 196 (H) 04/12/2018   Lab Results  Component Value Date   WBC 8.5 12/08/2017   HGB 12.9 12/08/2017   HCT 38.1 12/08/2017   MCV 82 12/08/2017   PLT 263 12/19/2014   No results found for: IRON, TIBC, FERRITIN  Attestation Statements:   Reviewed by clinician on day of visit: allergies, medications, problem list, medical history, surgical history, family history, social history, and previous encounter notes.  Migdalia Dk, am acting  as transcriptionist for [name], PA-C   I have reviewed the above documentation for accuracy and completeness, and I agree with the above. [name], PA-C

## 2019-04-20 DIAGNOSIS — Z23 Encounter for immunization: Secondary | ICD-10-CM | POA: Diagnosis not present

## 2019-04-20 DIAGNOSIS — E1165 Type 2 diabetes mellitus with hyperglycemia: Secondary | ICD-10-CM | POA: Diagnosis not present

## 2019-04-20 DIAGNOSIS — E782 Mixed hyperlipidemia: Secondary | ICD-10-CM | POA: Diagnosis not present

## 2019-04-20 DIAGNOSIS — I10 Essential (primary) hypertension: Secondary | ICD-10-CM | POA: Diagnosis not present

## 2019-04-20 LAB — LIPID PANEL WITH LDL/HDL RATIO
Cholesterol, Total: 229 mg/dL — ABNORMAL HIGH (ref 100–199)
HDL: 45 mg/dL (ref >39)
LDL Chol Calc (NIH): 139 mg/dL — ABNORMAL HIGH (ref 0–99)
LDL/HDL Ratio: 3.1 ratio (ref 0.0–3.2)
Triglycerides: 249 mg/dL — ABNORMAL HIGH (ref 0–149)
VLDL Cholesterol Cal: 45 mg/dL — ABNORMAL HIGH (ref 5–40)

## 2019-04-20 LAB — VITAMIN D 25 HYDROXY (VIT D DEFICIENCY, FRACTURES): Vit D, 25-Hydroxy: 26.2 ng/mL — ABNORMAL LOW (ref 30.0–100.0)

## 2019-04-20 LAB — COMPREHENSIVE METABOLIC PANEL
ALT: 74 IU/L — ABNORMAL HIGH (ref 0–32)
AST: 55 IU/L — ABNORMAL HIGH (ref 0–40)
Albumin/Globulin Ratio: 1.8 (ref 1.2–2.2)
Albumin: 4.2 g/dL (ref 3.8–4.8)
Alkaline Phosphatase: 82 IU/L (ref 39–117)
BUN/Creatinine Ratio: 31 — ABNORMAL HIGH (ref 12–28)
BUN: 26 mg/dL (ref 8–27)
Bilirubin Total: 0.4 mg/dL (ref 0.0–1.2)
CO2: 17 mmol/L — ABNORMAL LOW (ref 20–29)
Calcium: 9.7 mg/dL (ref 8.7–10.3)
Chloride: 101 mmol/L (ref 96–106)
Creatinine, Ser: 0.84 mg/dL (ref 0.57–1.00)
GFR calc Af Amer: 84 mL/min/{1.73_m2} (ref >59)
GFR calc non Af Amer: 73 mL/min/{1.73_m2} (ref >59)
Globulin, Total: 2.4 g/dL (ref 1.5–4.5)
Glucose: 148 mg/dL — ABNORMAL HIGH (ref 65–99)
Potassium: 4 mmol/L (ref 3.5–5.2)
Sodium: 140 mmol/L (ref 134–144)
Total Protein: 6.6 g/dL (ref 6.0–8.5)

## 2019-04-20 LAB — HEMOGLOBIN A1C
Est. average glucose Bld gHb Est-mCnc: 146 mg/dL
Hgb A1c MFr Bld: 6.7 % — ABNORMAL HIGH (ref 4.8–5.6)

## 2019-04-20 LAB — INSULIN, RANDOM: INSULIN: 40 u[IU]/mL — ABNORMAL HIGH (ref 2.6–24.9)

## 2019-04-23 DIAGNOSIS — N898 Other specified noninflammatory disorders of vagina: Secondary | ICD-10-CM | POA: Diagnosis not present

## 2019-04-23 DIAGNOSIS — Z01419 Encounter for gynecological examination (general) (routine) without abnormal findings: Secondary | ICD-10-CM | POA: Diagnosis not present

## 2019-04-23 DIAGNOSIS — L290 Pruritus ani: Secondary | ICD-10-CM | POA: Diagnosis not present

## 2019-04-23 DIAGNOSIS — Z13820 Encounter for screening for osteoporosis: Secondary | ICD-10-CM | POA: Diagnosis not present

## 2019-04-23 MED FILL — NYSTATIN-TRIAMCINOLONE OINT: 100000-0.1 | 15 days supply | Qty: 30 | Fill #0

## 2019-04-23 MED FILL — FLUCONAZOLE 150 MG TABS: 150 | 4 days supply | Qty: 2 | Fill #0

## 2019-04-25 MED FILL — CYCLOBENZAPRINE HCL 10 MG T: 10 | 30 days supply | Qty: 30 | Fill #0

## 2019-04-30 ENCOUNTER — Other Ambulatory Visit: Payer: Self-pay | Admitting: Nurse Practitioner

## 2019-04-30 DIAGNOSIS — Z13820 Encounter for screening for osteoporosis: Secondary | ICD-10-CM

## 2019-05-03 ENCOUNTER — Ambulatory Visit (INDEPENDENT_AMBULATORY_CARE_PROVIDER_SITE_OTHER): Payer: 59 | Admitting: Physician Assistant

## 2019-05-03 ENCOUNTER — Encounter (INDEPENDENT_AMBULATORY_CARE_PROVIDER_SITE_OTHER): Payer: Self-pay | Admitting: Physician Assistant

## 2019-05-03 ENCOUNTER — Ambulatory Visit (INDEPENDENT_AMBULATORY_CARE_PROVIDER_SITE_OTHER): Payer: 59 | Admitting: Psychology

## 2019-05-03 ENCOUNTER — Other Ambulatory Visit: Payer: Self-pay

## 2019-05-03 VITALS — BP 128/74 | HR 73 | Temp 98.0°F | Ht 62.0 in | Wt 232.0 lb

## 2019-05-03 DIAGNOSIS — Z6841 Body Mass Index (BMI) 40.0 and over, adult: Secondary | ICD-10-CM | POA: Diagnosis not present

## 2019-05-03 DIAGNOSIS — Z9189 Other specified personal risk factors, not elsewhere classified: Secondary | ICD-10-CM

## 2019-05-03 DIAGNOSIS — E1169 Type 2 diabetes mellitus with other specified complication: Secondary | ICD-10-CM

## 2019-05-03 DIAGNOSIS — F3289 Other specified depressive episodes: Secondary | ICD-10-CM

## 2019-05-03 DIAGNOSIS — F5089 Other specified eating disorder: Secondary | ICD-10-CM | POA: Diagnosis not present

## 2019-05-03 DIAGNOSIS — E559 Vitamin D deficiency, unspecified: Secondary | ICD-10-CM

## 2019-05-03 DIAGNOSIS — E785 Hyperlipidemia, unspecified: Secondary | ICD-10-CM

## 2019-05-03 MED ORDER — VITAMIN D (ERGOCALCIFEROL) 1.25 MG (50000 UNIT) PO CAPS: ORAL_CAPSULE | ORAL | 0 refills | Status: DC

## 2019-05-03 NOTE — Progress Notes (Signed)
Chief Complaint:   OBESITY Rhonda Aguilar is here to discuss her progress with her obesity treatment plan along with follow-up of her obesity related diagnoses. [name] is on the Category 3 Plan and states she is following her eating plan approximately 50% of the time. [name] states she is exercising for 0 minutes 0 times per week.  Today's visit was #: 66 Starting weight: 231 lbs Starting date: 12/08/2017 Today's weight: 232 lbs Today's date: 05/03/2019 Total lbs lost to date: 0 Total lbs lost since last in-office visit: 2 lbs  Interim History: [name] reports that she is feeling somewhat better and is eating a little more.  She is trying to get more protein in daily.  Subjective:   1. Vitamin D deficiency Rhonda Aguilar's Vitamin D level was 26.2 on 04/19/2019. She is currently taking vit D. She denies nausea, vomiting or muscle weakness.  2. Hyperlipidemia associated with type 2 diabetes mellitus ([name]) Rhonda Aguilar has hyperlipidemia and has been trying to improve her cholesterol levels with intensive lifestyle modification including a low saturated fat diet, exercise and weight loss. She denies any chest pain, claudication or myalgias.  She is taking pravastatin. Also taking Jardiance.  No hyperglycemia.  Lab Results  Component Value Date   HGBA1C 6.7 (H) 04/19/2019   Lab Results  Component Value Date   ALT 74 (H) 04/19/2019   AST 55 (H) 04/19/2019   ALKPHOS 82 04/19/2019   BILITOT 0.4 04/19/2019   Lab Results  Component Value Date   CHOL 229 (H) 04/19/2019   HDL 45 04/19/2019   LDLCALC 139 (H) 04/19/2019   TRIG 249 (H) 04/19/2019   3. At risk for heart disease Rhonda Aguilar is at a higher than average risk for cardiovascular disease due to obesity. Reviewed: no chest pain on exertion, no dyspnea on exertion, and no swelling of ankles.  Assessment/Plan:   1. Vitamin D deficiency Low Vitamin D level contributes to fatigue and are associated with obesity, breast, and colon cancer. She agrees to  continue to take prescription Vitamin D @50 ,000 IU every week and will follow-up for routine testing of Vitamin D, at least 2-3 times per year to avoid over-replacement. - Vitamin D, Ergocalciferol, (DRISDOL) 1.25 MG (50000 UNIT) CAPS capsule; TAKE 1 CAPSULE BY MOUTH EVERY 7 DAYS  Dispense: 4 capsule; Refill: 0  2. Hyperlipidemia associated with type 2 diabetes mellitus ([place]) Cardiovascular risk and specific lipid/LDL goals reviewed.  We discussed several lifestyle modifications today and Toka will continue to work on diet, exercise and weight loss efforts. Orders and follow up as documented in patient record.   Counseling Intensive lifestyle modifications are the first line treatment for this issue. . Dietary changes: Increase soluble fiber. Decrease simple carbohydrates. . Exercise changes: Moderate to vigorous-intensity aerobic activity 150 minutes per week if tolerated. . Lipid-lowering medications: see documented in medical record.  3. At risk for heart disease Rhonda Aguilar was given approximately 15 minutes of coronary artery disease prevention counseling today. She is 64 y.o. female and has risk factors for heart disease including obesity. We discussed intensive lifestyle modifications today with an emphasis on specific weight loss instructions and strategies.   Repetitive spaced learning was employed today to elicit superior memory formation and behavioral change.  4. Class 3 severe obesity with serious comorbidity and body mass index (BMI) of 40.0 to 44.9 in adult, unspecified obesity type [facility]) Rhonda Aguilar is currently in the action stage of change. As such, her goal is to continue with weight loss efforts. She  has agreed to the Category 3 Plan.   Exercise goals: Older adults should follow the adult guidelines. When older adults cannot meet the adult guidelines, they should be as physically active as their abilities and conditions will allow.   Behavioral modification strategies: decreasing  eating out and meal planning and cooking strategies.  [name] has agreed to follow-up with our clinic in 3 weeks. She was informed of the importance of frequent follow-up visits to maximize her success with intensive lifestyle modifications for her multiple health conditions.   Objective:   Blood pressure 128/74, pulse 73, temperature 98 F (36.7 C), temperature source Oral, height 5\' 2"  (1.575 m), weight 232 lb (105.2 kg), SpO2 96 %. Body mass index is 42.43 kg/m.  General: Cooperative, alert, well developed, in no acute distress. HEENT: Conjunctivae and lids unremarkable. Cardiovascular: Regular rhythm.  Lungs: Normal work of breathing. Neurologic: No focal deficits.   Lab Results  Component Value Date   CREATININE 0.84 04/19/2019   BUN 26 04/19/2019   NA 140 04/19/2019   K 4.0 04/19/2019   CL 101 04/19/2019   CO2 17 (L) 04/19/2019   Lab Results  Component Value Date   ALT 74 (H) 04/19/2019   AST 55 (H) 04/19/2019   ALKPHOS 82 04/19/2019   BILITOT 0.4 04/19/2019   Lab Results  Component Value Date   HGBA1C 6.7 (H) 04/19/2019   Lab Results  Component Value Date   INSULIN 40.0 (H) 04/19/2019   INSULIN 40.9 (H) 04/12/2018   INSULIN 38.2 (H) 12/08/2017   Lab Results  Component Value Date   TSH 2.550 12/08/2017   Lab Results  Component Value Date   CHOL 229 (H) 04/19/2019   HDL 45 04/19/2019   LDLCALC 139 (H) 04/19/2019   TRIG 249 (H) 04/19/2019   Lab Results  Component Value Date   WBC 8.5 12/08/2017   HGB 12.9 12/08/2017   HCT 38.1 12/08/2017   MCV 82 12/08/2017   PLT 263 12/19/2014   Attestation Statements:   Reviewed by clinician on day of visit: allergies, medications, problem list, medical history, surgical history, family history, social history, and previous encounter notes.  I, Water quality scientist, CMA, am acting as Location manager for [employer], PA-C.  I have reviewed the above documentation for accuracy and completeness, and I agree with the  above. [name], PA-C

## 2019-05-08 DIAGNOSIS — F341 Dysthymic disorder: Secondary | ICD-10-CM | POA: Diagnosis not present

## 2019-05-14 MED FILL — VALSARTAN 320 MG TAB: 320 | 30 days supply | Qty: 30 | Fill #0

## 2019-05-14 MED FILL — DEXILANT DR 60 MG CAPSULE: 60 | 30 days supply | Qty: 30 | Fill #0

## 2019-05-15 MED FILL — VIT D2 1.25 MG (50,000 UNIT: 1.25 MG | 28 days supply | Qty: 4 | Fill #0

## 2019-05-15 MED FILL — JARDIANCE 10 MG TABLET: 10 | 90 days supply | Qty: 90 | Fill #1

## 2019-05-17 ENCOUNTER — Ambulatory Visit: Payer: Self-pay | Admitting: Adult Health

## 2019-05-17 DIAGNOSIS — M7918 Myalgia, other site: Secondary | ICD-10-CM | POA: Diagnosis not present

## 2019-05-17 DIAGNOSIS — M9905 Segmental and somatic dysfunction of pelvic region: Secondary | ICD-10-CM | POA: Diagnosis not present

## 2019-05-17 DIAGNOSIS — M25551 Pain in right hip: Secondary | ICD-10-CM | POA: Diagnosis not present

## 2019-05-17 DIAGNOSIS — M545 Low back pain: Secondary | ICD-10-CM | POA: Diagnosis not present

## 2019-05-17 DIAGNOSIS — M9904 Segmental and somatic dysfunction of sacral region: Secondary | ICD-10-CM | POA: Diagnosis not present

## 2019-05-17 DIAGNOSIS — M9903 Segmental and somatic dysfunction of lumbar region: Secondary | ICD-10-CM | POA: Diagnosis not present

## 2019-05-17 NOTE — Progress Notes (Signed)
Office: [phone]  /  Fax: [phone]    Date: May 31, 2019   Appointment Start Time: 8:00am Duration: 25 minutes Provider: Glennie Isle, Psy.D. Type of Session: Individual Therapy  Location of Patient: Home Location of Provider: Provider's Home Type of Contact: Telepsychological Visit via MyChart Video Visit  Session Content: Rhonda Aguilar is a 64 y.o. female presenting via [place] Visit for a follow-up appointment to address the previously established treatment goal of decreasing emotional eating. Today's appointment was a telepsychological visit due to COVID-19. Rhonda Aguilar provided verbal consent for today's telepsychological appointment and she is aware she is responsible for securing confidentiality on her end of the session. Prior to proceeding with today's appointment, Rhonda Aguilar's physical location at the time of this appointment was obtained as well a phone number she could be reached at in the event of technical difficulties. Takenya and this provider participated in today's telepsychological service.   Hali acknowledged understanding that for today's appointment and future telepsychological appointments, MyChart will be utilized. She also verbally acknowledged understanding that the information outlined in the telepsychological informed consent form signed at the onset of treatment would still be applicable despite the change in the videoconferencing platform.   This provider conducted a brief check-in. Rhonda Aguilar shared about recent events, including a vacation. Regarding eating, Rhonda Aguilar reported she consistently consumes lunch per her meal plan and breakfast regularly. Previously established SMART goals were reviewed. She indicated she met her lunch goal, but acknowledged dinner continues to be a challenge. Nevertheless, she reported an overall improvement in her dinner eating habits. She continues to report a reduction in emotional eating. Furthermore, a plan was developed to help [name] cope with  emotional eating in the future using learned skills. She wrote down the following plan: focus on hydration; be prepared with snacks congruent to the meal plan; pause to ask questions when triggered to eat (e.g., Am I really hungry?, Is there something bothering me?, and Will I feel better if I eat?); and engage in discussed coping strategies after going through the aforementioned questions. She noted, "It's good to have it written down." Overall, Rhonda Aguilar was receptive to today's appointment as evidenced by openness to sharing, responsiveness to feedback, and willingness to continue engaging in learned skills.  Mental Status Examination:  Appearance: well groomed and appropriate hygiene  Behavior: appropriate to circumstances Mood: euthymic Affect: mood congruent Speech: normal in rate, volume, and tone Eye Contact: appropriate Psychomotor Activity: appropriate Gait: unable to assess Thought Process: linear, logical, and goal directed  Thought Content/Perception: no hallucinations, delusions, bizarre thinking or behavior reported or observed and no evidence of suicidal and homicidal ideation, plan, and intent Orientation: time, person, place and purpose of appointment Memory/Concentration: memory, attention, language, and fund of knowledge intact  Insight/Judgment: good  Interventions:  Conducted a brief chart review Provided empathic reflections and validation Employed supportive psychotherapy interventions to facilitate reduced distress and to improve coping skills with identified stressors Employed motivational interviewing skills to assess patient's willingness/desire to adhere to recommended medical treatments and assignments Reviewed learned skills  DSM-5 Diagnosis(es): 311 (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 [name] demonstrated progress in her goal as evidenced by increased awareness of hunger patterns, increased awareness of triggers for emotional eating and reduction in emotional eating. [name] also continues to demonstrate willingness to engage in learned skill(s).  Plan: As previously planned, today was Rhonda Aguilar's  last appointment with this provider. She acknowledged understanding that she may request a follow-up appointment with this provider in the future as long as she is still established with the clinic. Moreover, Rhonda Aguilar reported she continues to meet with her therapist, noting she plans to schedule a follow-up appointment. No further follow-up planned by this provider.

## 2019-05-22 ENCOUNTER — Encounter (INDEPENDENT_AMBULATORY_CARE_PROVIDER_SITE_OTHER): Payer: Self-pay | Admitting: Physician Assistant

## 2019-05-22 NOTE — Telephone Encounter (Signed)
FYI

## 2019-05-23 ENCOUNTER — Ambulatory Visit (INDEPENDENT_AMBULATORY_CARE_PROVIDER_SITE_OTHER): Payer: 59 | Admitting: Physician Assistant

## 2019-05-23 ENCOUNTER — Other Ambulatory Visit: Payer: Self-pay

## 2019-05-23 ENCOUNTER — Encounter (INDEPENDENT_AMBULATORY_CARE_PROVIDER_SITE_OTHER): Payer: Self-pay | Admitting: Physician Assistant

## 2019-05-23 VITALS — BP 113/72 | HR 73 | Temp 97.5°F | Ht 62.0 in | Wt 233.0 lb

## 2019-05-23 DIAGNOSIS — G4730 Sleep apnea, unspecified: Secondary | ICD-10-CM | POA: Diagnosis not present

## 2019-05-23 DIAGNOSIS — Z9189 Other specified personal risk factors, not elsewhere classified: Secondary | ICD-10-CM | POA: Diagnosis not present

## 2019-05-23 DIAGNOSIS — I10 Essential (primary) hypertension: Secondary | ICD-10-CM

## 2019-05-23 DIAGNOSIS — I152 Hypertension secondary to endocrine disorders: Secondary | ICD-10-CM

## 2019-05-23 DIAGNOSIS — Z6841 Body Mass Index (BMI) 40.0 and over, adult: Secondary | ICD-10-CM | POA: Diagnosis not present

## 2019-05-23 DIAGNOSIS — M9903 Segmental and somatic dysfunction of lumbar region: Secondary | ICD-10-CM | POA: Diagnosis not present

## 2019-05-23 DIAGNOSIS — M545 Low back pain: Secondary | ICD-10-CM | POA: Diagnosis not present

## 2019-05-23 DIAGNOSIS — M9904 Segmental and somatic dysfunction of sacral region: Secondary | ICD-10-CM | POA: Diagnosis not present

## 2019-05-23 DIAGNOSIS — E1159 Type 2 diabetes mellitus with other circulatory complications: Secondary | ICD-10-CM | POA: Diagnosis not present

## 2019-05-23 DIAGNOSIS — M25551 Pain in right hip: Secondary | ICD-10-CM | POA: Diagnosis not present

## 2019-05-23 DIAGNOSIS — G4733 Obstructive sleep apnea (adult) (pediatric): Secondary | ICD-10-CM | POA: Diagnosis not present

## 2019-05-23 DIAGNOSIS — E559 Vitamin D deficiency, unspecified: Secondary | ICD-10-CM

## 2019-05-23 DIAGNOSIS — M7918 Myalgia, other site: Secondary | ICD-10-CM | POA: Diagnosis not present

## 2019-05-23 DIAGNOSIS — M9905 Segmental and somatic dysfunction of pelvic region: Secondary | ICD-10-CM | POA: Diagnosis not present

## 2019-05-23 MED ORDER — VITAMIN D (ERGOCALCIFEROL) 1.25 MG (50000 UNIT) PO CAPS: ORAL_CAPSULE | ORAL | 0 refills | Status: DC

## 2019-05-23 NOTE — Progress Notes (Signed)
Chief Complaint:   OBESITY Rhonda Aguilar is here to discuss her progress with her obesity treatment plan along with follow-up of her obesity related diagnoses. Rhonda Aguilar is on the Category 3 Plan and states she is following her eating plan approximately 75% of the time. Rhonda Aguilar states she is exercising 0 minutes 0 times per week.  Today's visit was #: 30 Starting weight: 231 lbs Starting date: 12/08/2017 Today's weight: 233 lbs Today's date: 05/23/2019 Total lbs lost to date: 0 Total lbs lost since last in-office visit: 0  Interim History: Rhonda Aguilar reports that she continues to watch what she is eating due to cholechystitis. She is not getting enough protein.  Subjective:   Vitamin D deficiency. Rhonda Aguilar is on prescription Vitamin D weekly. Last Vitamin D level was not at goal - 26.2 on 04/19/2019.  Diabetes mellitus II with hypertension. Fasting blood sugars range between 132 and 143. Rhonda Aguilar is on Jardiance.   Lab Results  Component Value Date   HGBA1C 6.7 (H) 04/19/2019   Lab Results  Component Value Date   LDLCALC 139 (H) 04/19/2019   CREATININE 0.84 04/19/2019   Lab Results  Component Value Date   INSULIN 40.0 (H) 04/19/2019   INSULIN 40.9 (H) 04/12/2018   INSULIN 38.2 (H) 12/08/2017   At risk for heart disease. Rhonda Aguilar is at a higher than average risk for cardiovascular disease due to obesity.   Assessment/Plan:   Vitamin D deficiency. Low Vitamin D level contributes to fatigue and are associated with obesity, breast, and colon cancer. She will increase her Vitamin D to twice weekly and was given a prescription for Vitamin D, Ergocalciferol, (DRISDOL) 1.25 MG (50000 UNIT) CAPS capsule twice weekly #10 with 0 refills and will follow-up for routine testing of Vitamin D, at least 2-3 times per year to avoid over-replacement.     Diabetes mellitus II with hypertension. Good blood sugar control is important to decrease the likelihood of diabetic complications such as nephropathy,  neuropathy, limb loss, blindness, coronary artery disease, and death. Intensive lifestyle modification including diet, exercise and weight loss are the first line of treatment for diabetes. Rhonda Aguilar will continue her medication as directed.  At risk for heart disease. Rhonda Aguilar was given approximately 15 minutes of coronary artery disease prevention counseling today. She is 64 y.o. female and has risk factors for heart disease including obesity. We discussed intensive lifestyle modifications today with an emphasis on specific weight loss instructions and strategies.   Repetitive spaced learning was employed today to elicit superior memory formation and behavioral change.  Class 3 severe obesity with serious comorbidity and body mass index (BMI) of 40.0 to 44.9 in adult, unspecified obesity type ([place]).  Rhonda Aguilar is currently in the action stage of change. As such, her goal is to continue with weight loss efforts. She has agreed to the Category 3 Plan.   Exercise goals: Older adults should follow the adult guidelines. When older adults cannot meet the adult guidelines, they should be as physically active as their abilities and conditions will allow.   Behavioral modification strategies: increasing lean protein intake and meal planning and cooking strategies.  Rhonda Aguilar has agreed to follow-up with our clinic in 2 weeks. She was informed of the importance of frequent follow-up visits to maximize her success with intensive lifestyle modifications for her multiple health conditions.   Objective:   Blood pressure 113/72, pulse 73, temperature (!) 97.5 F (36.4 C), temperature source Oral, height 5\' 2"  (1.575 m), weight 233 lb (  105.7 kg), SpO2 95 %. Body mass index is 42.62 kg/m.  General: Cooperative, alert, well developed, in no acute distress. HEENT: Conjunctivae and lids unremarkable. Cardiovascular: Regular rhythm.  Lungs: Normal work of breathing. Neurologic: No focal deficits.   Lab Results    Component Value Date   CREATININE 0.84 04/19/2019   BUN 26 04/19/2019   NA 140 04/19/2019   K 4.0 04/19/2019   CL 101 04/19/2019   CO2 17 (L) 04/19/2019   Lab Results  Component Value Date   ALT 74 (H) 04/19/2019   AST 55 (H) 04/19/2019   ALKPHOS 82 04/19/2019   BILITOT 0.4 04/19/2019   Lab Results  Component Value Date   HGBA1C 6.7 (H) 04/19/2019   Lab Results  Component Value Date   INSULIN 40.0 (H) 04/19/2019   INSULIN 40.9 (H) 04/12/2018   INSULIN 38.2 (H) 12/08/2017   Lab Results  Component Value Date   TSH 2.550 12/08/2017   Lab Results  Component Value Date   CHOL 229 (H) 04/19/2019   HDL 45 04/19/2019   LDLCALC 139 (H) 04/19/2019   TRIG 249 (H) 04/19/2019   Lab Results  Component Value Date   WBC 8.5 12/08/2017   HGB 12.9 12/08/2017   HCT 38.1 12/08/2017   MCV 82 12/08/2017   PLT 263 12/19/2014   No results found for: IRON, TIBC, FERRITIN  Attestation Statements:   Reviewed by clinician on day of visit: allergies, medications, problem list, medical history, surgical history, family history, social history, and previous encounter notes.  IMichaelene Song, am acting as transcriptionist for [name], PA-C   I have reviewed the above documentation for accuracy and completeness, and I agree with the above. [name], PA-C

## 2019-05-28 MED FILL — VERAPAMIL ER PM 300 MG CAP: 300 | 90 days supply | Qty: 90 | Fill #0

## 2019-05-29 MED FILL — VIT D2 1.25 MG (50,000 UNIT: 1.25 MG | 35 days supply | Qty: 10 | Fill #0

## 2019-05-30 ENCOUNTER — Encounter: Payer: Self-pay | Admitting: Physician Assistant

## 2019-05-30 ENCOUNTER — Other Ambulatory Visit: Payer: Self-pay

## 2019-05-30 ENCOUNTER — Ambulatory Visit (INDEPENDENT_AMBULATORY_CARE_PROVIDER_SITE_OTHER): Payer: 59 | Admitting: Physician Assistant

## 2019-05-30 DIAGNOSIS — Z634 Disappearance and death of family member: Secondary | ICD-10-CM | POA: Diagnosis not present

## 2019-05-30 DIAGNOSIS — F411 Generalized anxiety disorder: Secondary | ICD-10-CM | POA: Diagnosis not present

## 2019-05-30 NOTE — Progress Notes (Signed)
[facility]  Patient ID: Rhonda Aguilar,  MRN: [id number]  PCP: Glenis Smoker, MD  Date of Evaluation: 05/30/2019 Time spent:20 minutes  Chief Complaint:  Chief Complaint    Anxiety; Insomnia      HISTORY/CURRENT STATUS: HPI For f/u of anxiety.  We increased the Buspar slightly at the last visit. She's been taking it routinely for the past 2 weeks.  She really does not like taking medications but since she has been on that she can tell that she is not scratching her scalp like she was.  It still happens but not as bad.  No other tics.  She does get anxious if there is a trigger but otherwise not really.  She rarely takes the Xanax.  She still gets sad when thinking about the loss of her brother who died a few months ago.  It seems to be normal grieving however.  She is able to enjoy things.  Energy and motivation are good.  She is not missing any work.  She sleeps okay most of the time.  No suicidal or homicidal thoughts.  Denies dizziness, syncope, seizures, numbness, tingling, tremor, unsteady gait, slurred speech, confusion. Denies muscle or joint pain, stiffness, or dystonia.  Individual Medical History/ Review of Systems: Changes? :No    Past medications for mental health diagnoses include: Pristiq, Zoloft caused her to be more depressed.  Allergies: Codeine and Penicillins  Current Medications:  Current Outpatient Medications:  .  ALPRAZolam (XANAX) 0.5 MG tablet, Take 0.5-2 tablets (0.25-1 mg total) by mouth every 8 (eight) hours as needed for anxiety., Disp: 30 tablet, Rfl: 0 .  busPIRone (BUSPAR) 5 MG tablet, 1/2 q am, 1 po qhs., Disp: 180 tablet, Rfl: 1 .  cetirizine (ZYRTEC) 10 MG tablet, Take 10 mg by mouth daily., Disp: , Rfl:  .  Coenzyme Q10 (COQ10) 200 MG CAPS, Take by mouth daily., Disp: , Rfl:  .  cyclobenzaprine (FLEXERIL) 10 MG tablet, Take 10 mg by mouth 3 (three) times daily as needed for muscle spasms., Disp: , Rfl:  .  dexlansoprazole  (DEXILANT) 60 MG capsule, Take 60 mg by mouth daily., Disp: , Rfl:  .  empagliflozin (JARDIANCE) 10 MG TABS tablet, Take 10 mg by mouth daily., Disp: 30 tablet, Rfl: 0 .  ibuprofen (ADVIL,MOTRIN) 600 MG tablet, Take 600 mg by mouth every 6 (six) hours as needed for mild pain., Disp: , Rfl:  .  Peppermint Oil (IBGARD PO), Take by mouth., Disp: , Rfl:  .  pravastatin (PRAVACHOL) 10 MG tablet, Take 10 mg by mouth daily., Disp: , Rfl:  .  Probiotic Product (ALIGN) 4 MG CAPS, Take by mouth., Disp: , Rfl:  .  promethazine (PHENERGAN) 12.5 MG tablet, Take 1 tablet (12.5 mg total) by mouth every 8 (eight) hours as needed for nausea, vomiting or refractory nausea / vomiting., Disp: 60 tablet, Rfl: 3 .  valsartan (DIOVAN) 320 MG tablet, Take 320 mg by mouth daily. , Disp: , Rfl:  .  Verapamil HCl CR 300 MG CP24, Take 300 mg by mouth at bedtime. , Disp: , Rfl:  .  Vitamin D, Ergocalciferol, (DRISDOL) 1.25 MG (50000 UNIT) CAPS capsule, TAKE 1 CAPSULE BY MOUTH (TWICE) WEEKLY, Disp: 10 capsule, Rfl: 0 Medication Side Effects: none  Family Medical/ Social History: Changes? No  MENTAL HEALTH EXAM:  There were no vitals taken for this visit.There is no height or weight on file to calculate BMI.  General Appearance: Casual, Neat, Well Groomed and  Obese  Eye Contact:  Good  Speech:  Clear and Coherent and Normal Rate  Volume:  Normal  Mood:  Euthymic  Affect:  Appropriate  Thought Process:  Goal Directed and Descriptions of Associations: Intact  Orientation:  Full (Time, Place, and Person)  Thought Content: Logical   Suicidal Thoughts:  No  Homicidal Thoughts:  No  Memory:  WNL  Judgement:  Good  Insight:  Good  Psychomotor Activity:  Normal  Concentration:  Concentration: Good  Recall:  Good  Fund of Knowledge: Good  Language: Good  Assets:  Desire for Improvement  ADL's:  Intact  Cognition: WNL  Prognosis:  Good    DIAGNOSES:    ICD-10-CM   1. Generalized anxiety disorder  F41.1   2.  Bereavement  Z63.4     Receiving Psychotherapy: No    RECOMMENDATIONS:  PDMP reviewed. I spent 20 mins w/ her. Since she is feeling some better, she would prefer to stay on the current dose of BuSpar for another month and see how well she does.  If she is not at least 75% better, then she will increase the BuSpar to 5 mg p.o. twice daily. Continue BuSpar 5 mg, one half p.o. every morning and 1 p.o. nightly. Continue Xanax 0.5 mg, 1/2-1 p.o. 3 times daily as needed. Return in 2 months.  Donnal Moat, PA-C

## 2019-05-31 ENCOUNTER — Telehealth (INDEPENDENT_AMBULATORY_CARE_PROVIDER_SITE_OTHER): Payer: 59 | Admitting: Psychology

## 2019-05-31 ENCOUNTER — Ambulatory Visit (INDEPENDENT_AMBULATORY_CARE_PROVIDER_SITE_OTHER): Payer: Self-pay | Admitting: Psychology

## 2019-05-31 DIAGNOSIS — F3289 Other specified depressive episodes: Secondary | ICD-10-CM

## 2019-06-07 ENCOUNTER — Other Ambulatory Visit: Payer: Self-pay

## 2019-06-07 ENCOUNTER — Ambulatory Visit (INDEPENDENT_AMBULATORY_CARE_PROVIDER_SITE_OTHER): Payer: 59 | Admitting: Physician Assistant

## 2019-06-07 ENCOUNTER — Encounter (INDEPENDENT_AMBULATORY_CARE_PROVIDER_SITE_OTHER): Payer: Self-pay | Admitting: Physician Assistant

## 2019-06-07 VITALS — BP 124/73 | HR 86 | Temp 98.4°F | Ht 62.0 in | Wt 235.0 lb

## 2019-06-07 DIAGNOSIS — E119 Type 2 diabetes mellitus without complications: Secondary | ICD-10-CM

## 2019-06-07 DIAGNOSIS — Z6841 Body Mass Index (BMI) 40.0 and over, adult: Secondary | ICD-10-CM

## 2019-06-07 NOTE — Progress Notes (Signed)
Chief Complaint:   OBESITY Rhonda Aguilar is here to discuss her progress with her obesity treatment plan along with follow-up of her obesity related diagnoses. Rhonda Aguilar is on the Category 3 Plan and states she is following her eating plan approximately 60% of the time. Rhonda Aguilar states she is exercising 0 minutes 0 times per week.  Today's visit was #: 43 Starting weight: 231 lbs Starting date: 12/08/2017 Today's weight: 235 lbs Today's date: 06/07/2019 Total lbs lost to date: 0 Total lbs lost since last in-office visit: 0  Interim History: Rhonda Aguilar states that she stopped drinking Coke Zero and her cravings for sweets have decreased.  She has been eating more protein and less carbs.  She is not eating all of her food on the plan.  Subjective:   1. Type 2 diabetes mellitus without complication, without long-term current use of insulin ([place]) She is taking Jardiance.  No hypoglycemia.  No UTIs or yeast infections.  Lab Results  Component Value Date   HGBA1C 6.7 (H) 04/19/2019   Lab Results  Component Value Date   LDLCALC 139 (H) 04/19/2019   CREATININE 0.84 04/19/2019   Lab Results  Component Value Date   INSULIN 40.0 (H) 04/19/2019   INSULIN 40.9 (H) 04/12/2018   INSULIN 38.2 (H) 12/08/2017   Assessment/Plan:   1. Type 2 diabetes mellitus without complication, without long-term current use of insulin (Rhonda Aguilar) Good blood sugar control is important to decrease the likelihood of diabetic complications such as nephropathy, neuropathy, limb loss, blindness, coronary artery disease, and death. Intensive lifestyle modification including diet, exercise and weight loss are the first line of treatment for diabetes.   2. Class 3 severe obesity with serious comorbidity and body mass index (BMI) of 40.0 to 44.9 in adult, unspecified obesity type [facility]) Rhonda Aguilar is currently in the action stage of change. As such, her goal is to continue with weight loss efforts. She has agreed to the Category 3 Plan.    Exercise goals: Older adults should follow the adult guidelines. When older adults cannot meet the adult guidelines, they should be as physically active as their abilities and conditions will allow.   Behavioral modification strategies: increasing lean protein intake and no skipping meals.  Rhonda Aguilar has agreed to follow-up with our clinic in 2 weeks. She was informed of the importance of frequent follow-up visits to maximize her success with intensive lifestyle modifications for her multiple health conditions.   Objective:   Blood pressure 124/73, pulse 86, temperature 98.4 F (36.9 C), temperature source Oral, height 5\' 2"  (1.575 m), weight 235 lb (106.6 kg), SpO2 96 %. Body mass index is 42.98 kg/m.  General: Cooperative, alert, well developed, in no acute distress. HEENT: Conjunctivae and lids unremarkable. Cardiovascular: Regular rhythm.  Lungs: Normal work of breathing. Neurologic: No focal deficits.   Lab Results  Component Value Date   CREATININE 0.84 04/19/2019   BUN 26 04/19/2019   NA 140 04/19/2019   K 4.0 04/19/2019   CL 101 04/19/2019   CO2 17 (L) 04/19/2019   Lab Results  Component Value Date   ALT 74 (H) 04/19/2019   AST 55 (H) 04/19/2019   ALKPHOS 82 04/19/2019   BILITOT 0.4 04/19/2019   Lab Results  Component Value Date   HGBA1C 6.7 (H) 04/19/2019   Lab Results  Component Value Date   INSULIN 40.0 (H) 04/19/2019   INSULIN 40.9 (H) 04/12/2018   INSULIN 38.2 (H) 12/08/2017   Lab Results  Component Value Date  TSH 2.550 12/08/2017   Lab Results  Component Value Date   CHOL 229 (H) 04/19/2019   HDL 45 04/19/2019   LDLCALC 139 (H) 04/19/2019   TRIG 249 (H) 04/19/2019   Lab Results  Component Value Date   WBC 8.5 12/08/2017   HGB 12.9 12/08/2017   HCT 38.1 12/08/2017   MCV 82 12/08/2017   PLT 263 12/19/2014   Attestation Statements:   Reviewed by clinician on day of visit: allergies, medications, problem list, medical history, surgical  history, family history, social history, and previous encounter notes.  Time spent on visit including pre-visit chart review and post-visit care and charting was 33 minutes.   I, Water quality scientist, CMA, am acting as Location manager for [employer], PA-C.  I have reviewed the above documentation for accuracy and completeness, and I agree with the above. Rhonda Potash, PA-C

## 2019-06-08 DIAGNOSIS — Z7984 Long term (current) use of oral hypoglycemic drugs: Secondary | ICD-10-CM | POA: Diagnosis not present

## 2019-06-08 DIAGNOSIS — E782 Mixed hyperlipidemia: Secondary | ICD-10-CM | POA: Diagnosis not present

## 2019-06-08 DIAGNOSIS — Z0000 Encounter for general adult medical examination without abnormal findings: Secondary | ICD-10-CM | POA: Diagnosis not present

## 2019-06-08 DIAGNOSIS — I10 Essential (primary) hypertension: Secondary | ICD-10-CM | POA: Diagnosis not present

## 2019-06-08 DIAGNOSIS — E1165 Type 2 diabetes mellitus with hyperglycemia: Secondary | ICD-10-CM | POA: Diagnosis not present

## 2019-06-08 MED FILL — JARDIANCE 25 MG TABLET: 25 | 90 days supply | Qty: 90 | Fill #0

## 2019-06-12 ENCOUNTER — Other Ambulatory Visit: Payer: Self-pay | Admitting: Nurse Practitioner

## 2019-06-12 ENCOUNTER — Other Ambulatory Visit: Payer: Self-pay

## 2019-06-12 ENCOUNTER — Ambulatory Visit: Payer: 59 | Admitting: Adult Health

## 2019-06-12 ENCOUNTER — Encounter: Payer: Self-pay | Admitting: Adult Health

## 2019-06-12 VITALS — BP 150/95 | HR 73 | Temp 97.0°F | Ht 62.0 in | Wt 239.8 lb

## 2019-06-12 DIAGNOSIS — G4733 Obstructive sleep apnea (adult) (pediatric): Secondary | ICD-10-CM | POA: Diagnosis not present

## 2019-06-12 DIAGNOSIS — Z13820 Encounter for screening for osteoporosis: Secondary | ICD-10-CM

## 2019-06-12 DIAGNOSIS — Z9989 Dependence on other enabling machines and devices: Secondary | ICD-10-CM

## 2019-06-12 NOTE — Progress Notes (Signed)
PATIENT: Rhonda Aguilar DOB: 01/01/1956  REASON FOR VISIT: follow up HISTORY FROM: patient  HISTORY OF PRESENT ILLNESS: Today 06/12/19:  [name] is a 64 year old female with a history of obstructive sleep apnea on CPAP.  Her download indicates that she use her machine nightly for compliance of 100%.  Leak in the 95th percentile is 8.2.  She reports that the CPAP is working well for her.  She returns today for an evaluation.  She is machine greater than 4 hours each night.  On average she uses her machine 7 hours and 51 minutes.  Her residual AHI is 0.3 on 6 to 16 cm of water with EPR of 1.  HISTORY Rhonda Aguilar is a 64 y.o. female with underlying medical history of HTN, DM, migraines, obesity and OSA who was recently evaluated in this office by Dr. Brett Fairy for OSA evaluation on 01/17/2018.  She had been previously diagnosed with OSA in 2012 by this office and continued to use CPAP for management but was referred back to this office as she was lost to follow-up to ensure settings were still appropriate and OSA adequately managed.  She also had complaints of ocular aura migraines with hemiparetic component with history of migraines (no prior auras) but previously controlled until recently initiating oral metformin for newly diagnosed diabetes.  It was recommended for her to obtain home sleep study without use of CPAP to ensure adequate settings which was obtained on 02/16/2018 and showed AHI 24.9 and REM AHI 52.3 therefore recommended auto titration device with 6 to 16 cm water pressure with a EPR of 2.  Compliance report obtained from 04/02/2018 -05/01/2018 showing 30 out of 30 usage days and 30 days greater than 4 hours 400% compliance.  Average usage 7 hours and 15 minutes with residual AHI 0.2.  Leaks in the 95th percentile 11.7 L/min and pressure in the 95th percentile 11.3 cm H2O with pressure settings 6 cm H2O to 16 cm H2O with EPR level 1.  She does not have any complaints with current CPAP  machine or nasal pillows.  She [facility].  She denies any excessive daytime fatigue or insomnia.  Migraines have resolved since discontinuation of diabetic medication.  She continues to have A1c monitored by PCP with most recent level 6.2 not on any diabetic medication.  No further concerns at this time.  She returns today for reevaluation.   REVIEW OF SYSTEMS: Out of a complete 14 system review of symptoms, the patient complains only of the following symptoms, and all other reviewed systems are negative.  FSS 35 ESS 4   ALLERGIES: Allergies  Allergen Reactions  . Codeine Itching and Rash  . Penicillins Rash    HOME MEDICATIONS: Outpatient Medications Prior to Visit  Medication Sig Dispense Refill  . ALPRAZolam (XANAX) 0.5 MG tablet Take 0.5-2 tablets (0.25-1 mg total) by mouth every 8 (eight) hours as needed for anxiety. 30 tablet 0  . busPIRone (BUSPAR) 5 MG tablet 1/2 q am, 1 po qhs. 180 tablet 1  . cetirizine (ZYRTEC) 10 MG tablet Take 10 mg by mouth daily.    . Coenzyme Q10 (COQ10) 200 MG CAPS Take by mouth daily.    . cyclobenzaprine (FLEXERIL) 10 MG tablet Take 10 mg by mouth 3 (three) times daily as needed for muscle spasms.    [name] dexlansoprazole (DEXILANT) 60 MG capsule Take 60 mg by mouth daily.    [name]  empagliflozin (JARDIANCE) 25 MG TABS tablet Take 25 mg by mouth daily.    [name] ibuprofen (ADVIL,MOTRIN) 600 MG tablet Take 600 mg by mouth every 6 (six) hours as needed for mild pain.    [name] Peppermint Oil (IBGARD PO) Take by mouth.    . pravastatin (PRAVACHOL) 10 MG tablet Take 10 mg by mouth daily.    . Probiotic Product (ALIGN) 4 MG CAPS Take by mouth.    . promethazine (PHENERGAN) 12.5 MG tablet Take 1 tablet (12.5 mg total) by mouth every 8 (eight) hours as needed for nausea, vomiting or refractory nausea / vomiting. 60 tablet 3  . valsartan (DIOVAN) 320 MG tablet Take 320 mg by mouth daily.     . Verapamil HCl  CR 300 MG CP24 Take 300 mg by mouth at bedtime.     . Vitamin D, Ergocalciferol, (DRISDOL) 1.25 MG (50000 UNIT) CAPS capsule TAKE 1 CAPSULE BY MOUTH (TWICE) WEEKLY 10 capsule 0  . empagliflozin (JARDIANCE) 10 MG TABS tablet Take 10 mg by mouth daily. 30 tablet 0   No facility-administered medications prior to visit.    PAST MEDICAL HISTORY: Past Medical History:  Diagnosis Date  . Benign essential HTN 11/22/2014  . Diabetes mellitus, type II ([place])   . Fatty liver   . GERD (gastroesophageal reflux disease)   . Hypertension   . IBS (irritable bowel syndrome)   . Migraine   . Obesity (BMI 30-39.9) 11/22/2014  . OSA (obstructive sleep apnea)     PAST SURGICAL HISTORY: Past Surgical History:  Procedure Laterality Date  . COLONOSCOPY    . TONSILLECTOMY      FAMILY HISTORY: Family History  Problem Relation Age of Onset  . Diabetes Mother   . Heart disease Mother   . Hypertension Mother   . High Cholesterol Mother   . Kidney disease Mother   . Depression Mother   . Obesity Mother   . Coronary artery disease Father   . High blood pressure Father   . High Cholesterol Father   . Depression Father   . Hypertension Brother     SOCIAL HISTORY: Social History   Socioeconomic History  . Marital status: Divorced    Spouse name: Not on file  . Number of children: 0  . Years of education: college  . Highest education level: Not on file  Occupational History  . Occupation: Engineer, mining cardiology  Tobacco Use  . Smoking status: Never Smoker  . Smokeless tobacco: Never Used  Substance and Sexual Activity  . Alcohol use: Yes    Alcohol/week: 0.0 - 1.0 standard drinks  . Drug use: Never  . Sexual activity: Not on file  Other Topics Concern  . Not on file  Social History Narrative  . Not on file   Social Determinants of Health   Financial Resource Strain:   . Difficulty of Paying Living Expenses:   Food Insecurity:   . Worried About Charity fundraiser in the Last  Year:   . Arboriculturist in the Last Year:   Transportation Needs:   . Film/video editor (Medical):   [name] Lack of Transportation (Non-Medical):   Physical Activity:   . Days of Exercise per Week:   . Minutes of Exercise per Session:   Stress:   . Feeling of Stress :   Social Connections:   . Frequency of Communication with Friends and Family:   . Frequency of Social Gatherings with Friends and Family:   .  Attends Religious Services:   . Active Member of Clubs or Organizations:   . Attends Archivist Meetings:   [name] Marital Status:   Intimate Partner Violence:   . Fear of Current or Ex-Partner:   . Emotionally Abused:   [name] Physically Abused:   . Sexually Abused:       PHYSICAL EXAM  Vitals:   06/12/19 0754  BP: (!) 150/95  Pulse: 73  Temp: (!) 97 F (36.1 C)  Weight: 239 lb 12.8 oz (108.8 kg)  Height: 5\' 2"  (1.575 m)   Body mass index is 43.86 kg/m.  Generalized: Well developed, in no acute distress  Chest: Lungs clear to auscultation bilaterally  Neurological examination  Mentation: Alert oriented to time, place, history taking. Follows all commands speech and language fluent Cranial nerve II-XII: Extraocular movements were full, visual field were full on confrontational test Head turning and shoulder shrug  were normal and symmetric. Motor: The motor testing reveals 5 over 5 strength of all 4 extremities. Good symmetric motor tone is noted throughout.  Sensory: Sensory testing is intact to soft touch on all 4 extremities. No evidence of extinction is noted.  Gait and station: Gait is normal.    DIAGNOSTIC DATA (LABS, IMAGING, TESTING) - I reviewed patient records, labs, notes, testing and imaging myself where available.  Lab Results  Component Value Date   WBC 8.5 12/08/2017   HGB 12.9 12/08/2017   HCT 38.1 12/08/2017   MCV 82 12/08/2017   PLT 263 12/19/2014      Component Value Date/Time   NA 140 04/19/2019 1607   K 4.0 04/19/2019 1607    CL 101 04/19/2019 1607   CO2 17 (L) 04/19/2019 1607   GLUCOSE 148 (H) 04/19/2019 1607   BUN 26 04/19/2019 1607   CREATININE 0.84 04/19/2019 1607   CALCIUM 9.7 04/19/2019 1607   PROT 6.6 04/19/2019 1607   ALBUMIN 4.2 04/19/2019 1607   AST 55 (H) 04/19/2019 1607   ALT 74 (H) 04/19/2019 1607   ALKPHOS 82 04/19/2019 1607   BILITOT 0.4 04/19/2019 1607   GFRNONAA 73 04/19/2019 1607   GFRAA 84 04/19/2019 1607   Lab Results  Component Value Date   CHOL 229 (H) 04/19/2019   HDL 45 04/19/2019   LDLCALC 139 (H) 04/19/2019   TRIG 249 (H) 04/19/2019   Lab Results  Component Value Date   HGBA1C 6.7 (H) 04/19/2019   No results found for: [id number] Lab Results  Component Value Date   TSH 2.550 12/08/2017      ASSESSMENT AND PLAN 64 y.o. year old female  has a past medical history of Benign essential HTN (11/22/2014), Diabetes mellitus, type II ([place]), Fatty liver, GERD (gastroesophageal reflux disease), Hypertension, IBS (irritable bowel syndrome), Migraine, Obesity (BMI 30-39.9) (11/22/2014), and OSA (obstructive sleep apnea). here with:  1. OSA on CPAP  - CPAP compliance excellent - Good treatment of AHI  - Encourage patient to use CPAP nightly and > 4 hours each night - F/U in 1 year or sooner if needed   I spent 20  minutes of face-to-face and non-face-to-face time with patient.  This included previsit chart review, lab review, study review, order entry, electronic health record documentation, patient education.  Ward Givens, MSN, NP-C 06/12/2019, 8:05 AM [facility] [address], [place], [place] [address] [phone]

## 2019-06-12 NOTE — Patient Instructions (Signed)
Continue using CPAP nightly and greater than 4 hours each night °If your symptoms worsen or you develop new symptoms please let us know.  ° °

## 2019-06-13 MED FILL — DEXILANT DR 60 MG CAPSULE: 60 | 30 days supply | Qty: 30 | Fill #0

## 2019-06-13 MED FILL — VALSARTAN 320 MG TABLET: 320 | 30 days supply | Qty: 30 | Fill #0

## 2019-06-26 ENCOUNTER — Other Ambulatory Visit: Payer: Self-pay

## 2019-06-26 ENCOUNTER — Encounter (INDEPENDENT_AMBULATORY_CARE_PROVIDER_SITE_OTHER): Payer: Self-pay | Admitting: Physician Assistant

## 2019-06-26 ENCOUNTER — Ambulatory Visit (INDEPENDENT_AMBULATORY_CARE_PROVIDER_SITE_OTHER): Payer: 59 | Admitting: Physician Assistant

## 2019-06-26 VITALS — BP 125/87 | HR 82 | Temp 98.2°F | Ht 62.0 in | Wt 234.0 lb

## 2019-06-26 DIAGNOSIS — E7849 Other hyperlipidemia: Secondary | ICD-10-CM

## 2019-06-26 DIAGNOSIS — Z9189 Other specified personal risk factors, not elsewhere classified: Secondary | ICD-10-CM | POA: Diagnosis not present

## 2019-06-26 DIAGNOSIS — E559 Vitamin D deficiency, unspecified: Secondary | ICD-10-CM | POA: Diagnosis not present

## 2019-06-26 DIAGNOSIS — I10 Essential (primary) hypertension: Secondary | ICD-10-CM

## 2019-06-26 DIAGNOSIS — Z6841 Body Mass Index (BMI) 40.0 and over, adult: Secondary | ICD-10-CM | POA: Diagnosis not present

## 2019-06-26 DIAGNOSIS — E1159 Type 2 diabetes mellitus with other circulatory complications: Secondary | ICD-10-CM

## 2019-06-26 DIAGNOSIS — I152 Hypertension secondary to endocrine disorders: Secondary | ICD-10-CM

## 2019-06-26 DIAGNOSIS — E66813 Obesity, class 3: Secondary | ICD-10-CM

## 2019-06-26 MED ORDER — VITAMIN D (ERGOCALCIFEROL) 1.25 MG (50000 UNIT) PO CAPS: ORAL_CAPSULE | ORAL | 0 refills | Status: DC

## 2019-06-26 MED FILL — VIT D2 1.25 MG (50,000 UNIT: 1.25 MG | 35 days supply | Qty: 10 | Fill #0

## 2019-06-26 NOTE — Progress Notes (Signed)
Chief Complaint:   OBESITY Rhonda Aguilar is here to discuss her progress with her obesity treatment plan along with follow-up of her obesity related diagnoses. Rhonda Aguilar is on the Category 3 Plan and states she is following her eating plan approximately 50% of the time. Rhonda Aguilar states she is being more active.   Today's visit was #: 78 Starting weight: 231 lbs Starting date: 12/08/2017 Today's weight: 234 lbs Today's date: 06/26/2019 Total lbs lost to date: 0 Total lbs lost since last in-office visit: 1  Interim History: Avia states that with her busy schedule at work she has been skipping meals and is not meal planning.  Subjective:   Hypertension associated with type 2 diabetes mellitus ([place]). Rhonda Aguilar is on Jardiance and denies UTI's or yeast infections. She states she is not drinking enough water, especially when she works. Fasting blood sugars range between 130 and 140's.  Lab Results  Component Value Date   HGBA1C 6.7 (H) 04/19/2019   Lab Results  Component Value Date   LDLCALC 139 (H) 04/19/2019   CREATININE 0.84 04/19/2019   Lab Results  Component Value Date   INSULIN 40.0 (H) 04/19/2019   INSULIN 40.9 (H) 04/12/2018   INSULIN 38.2 (H) 12/08/2017   Other hyperlipidemia. Rhonda Aguilar is on pravastatin and denies myalgias or chest pain. She is not exercising regularly.  Lab Results  Component Value Date   CHOL 229 (H) 04/19/2019   HDL 45 04/19/2019   LDLCALC 139 (H) 04/19/2019   TRIG 249 (H) 04/19/2019   Lab Results  Component Value Date   ALT 74 (H) 04/19/2019   AST 55 (H) 04/19/2019   ALKPHOS 82 04/19/2019   BILITOT 0.4 04/19/2019   The 10-year ASCVD risk score Rhonda Bussing DC Jr., et al., 2013) is: 15.5%   Values used to calculate the score:     Age: 64 years     Sex: Female     Is Non-Hispanic African American: No     Diabetic: Yes     Tobacco smoker: No     Systolic Blood Pressure: 0000000 mmHg     Is BP treated: Yes     HDL Cholesterol: 45 mg/dL     Total  Cholesterol: 229 mg/dL  Vitamin D deficiency. Rhonda Aguilar is on prescription Vitamin D twice weekly. No nausea, vomiting, or muscle weakness. Last Vitamin D 26.2 on 04/19/2019.  At risk for hypoglycemia. Rhonda Aguilar is at increased risk for hypoglycemia due to changes in diet, diagnosis of diabetes, and/or insulin use.   Assessment/Plan:   Hypertension associated with type 2 diabetes mellitus ([place]). Good blood sugar control is important to decrease the likelihood of diabetic complications such as nephropathy, neuropathy, limb loss, blindness, coronary artery disease, and death. Intensive lifestyle modification including diet, exercise and weight loss are the first line of treatment for diabetes. Rhonda Aguilar will continue her medications as directed.  Other hyperlipidemia. Cardiovascular risk and specific lipid/LDL goals reviewed.  We discussed several lifestyle modifications today and Rhonda Aguilar will continue to work on diet, exercise and weight loss efforts. Orders and follow up as documented in patient record. Rhonda Aguilar will continue pravastatin as directed.  Counseling Intensive lifestyle modifications are the first line treatment for this issue. . Dietary changes: Increase soluble fiber. Decrease simple carbohydrates. . Exercise changes: Moderate to vigorous-intensity aerobic activity 150 minutes per week if tolerated. . Lipid-lowering medications: see documented in medical record.  Vitamin D deficiency. Low Vitamin D level contributes to fatigue and are associated with obesity, breast,  and colon cancer. She was given a refill on her Vitamin D, Ergocalciferol, (DRISDOL) 1.25 MG (50000 UNIT) CAPS capsule twice weekly #10 with 0 refills and will follow-up for routine testing of Vitamin D, at least 2-3 times per year to avoid over-replacement.     At risk for hypoglycemia. Rhonda Aguilar was given approximately 15 minutes of counseling today regarding prevention of hypoglycemia. She was advised of symptoms of hypoglycemia. Rhonda Aguilar  was instructed to avoid skipping meals, eat regular protein rich meals and schedule low calorie snacks as needed.   Repetitive spaced learning was employed today to elicit superior memory formation and behavioral change  Class 3 severe obesity with serious comorbidity and body mass index (BMI) of 40.0 to 44.9 in adult, unspecified obesity type ([employer]).  Rhonda Aguilar is currently in the action stage of change. As such, her goal is to continue with weight loss efforts. She has agreed to keeping a food journal and adhering to recommended goals of 1500 calories and 95 grams of protein daily.   Exercise goals: Older adults should follow the adult guidelines. When older adults cannot meet the adult guidelines, they should be as physically active as their abilities and conditions will allow.   Behavioral modification strategies: meal planning and cooking strategies and keeping a strict food journal.  Rhonda Aguilar has agreed to follow-up with our clinic in 2 weeks. She was informed of the importance of frequent follow-up visits to maximize her success with intensive lifestyle modifications for her multiple health conditions.   Objective:   Blood pressure 125/87, pulse 82, temperature 98.2 F (36.8 C), temperature source Oral, height 5\' 2"  (1.575 m), weight 234 lb (106.1 kg), SpO2 94 %. Body mass index is 42.8 kg/m.  General: Cooperative, alert, well developed, in no acute distress. HEENT: Conjunctivae and lids unremarkable. Cardiovascular: Regular rhythm.  Lungs: Normal work of breathing. Neurologic: No focal deficits.   Lab Results  Component Value Date   CREATININE 0.84 04/19/2019   BUN 26 04/19/2019   NA 140 04/19/2019   K 4.0 04/19/2019   CL 101 04/19/2019   CO2 17 (L) 04/19/2019   Lab Results  Component Value Date   ALT 74 (H) 04/19/2019   AST 55 (H) 04/19/2019   ALKPHOS 82 04/19/2019   BILITOT 0.4 04/19/2019   Lab Results  Component Value Date   HGBA1C 6.7 (H) 04/19/2019   Lab Results    Component Value Date   INSULIN 40.0 (H) 04/19/2019   INSULIN 40.9 (H) 04/12/2018   INSULIN 38.2 (H) 12/08/2017   Lab Results  Component Value Date   TSH 2.550 12/08/2017   Lab Results  Component Value Date   CHOL 229 (H) 04/19/2019   HDL 45 04/19/2019   LDLCALC 139 (H) 04/19/2019   TRIG 249 (H) 04/19/2019   Lab Results  Component Value Date   WBC 8.5 12/08/2017   HGB 12.9 12/08/2017   HCT 38.1 12/08/2017   MCV 82 12/08/2017   PLT 263 12/19/2014   No results found for: IRON, TIBC, FERRITIN  Attestation Statements:   Reviewed by clinician on day of visit: allergies, medications, problem list, medical history, surgical history, family history, social history, and previous encounter notes.  Rhonda Aguilar, am acting as transcriptionist for [name], PA-C   I have reviewed the above documentation for accuracy and completeness, and I agree with the above. [name], PA-C

## 2019-07-10 ENCOUNTER — Other Ambulatory Visit: Payer: Self-pay

## 2019-07-10 ENCOUNTER — Encounter (INDEPENDENT_AMBULATORY_CARE_PROVIDER_SITE_OTHER): Payer: Self-pay | Admitting: Physician Assistant

## 2019-07-10 ENCOUNTER — Ambulatory Visit (INDEPENDENT_AMBULATORY_CARE_PROVIDER_SITE_OTHER): Payer: 59 | Admitting: Physician Assistant

## 2019-07-10 VITALS — BP 136/85 | HR 77 | Temp 98.4°F | Ht 62.0 in | Wt 236.0 lb

## 2019-07-10 DIAGNOSIS — E1159 Type 2 diabetes mellitus with other circulatory complications: Secondary | ICD-10-CM | POA: Diagnosis not present

## 2019-07-10 DIAGNOSIS — I152 Hypertension secondary to endocrine disorders: Secondary | ICD-10-CM

## 2019-07-10 DIAGNOSIS — I10 Essential (primary) hypertension: Secondary | ICD-10-CM | POA: Diagnosis not present

## 2019-07-10 DIAGNOSIS — Z6841 Body Mass Index (BMI) 40.0 and over, adult: Secondary | ICD-10-CM | POA: Diagnosis not present

## 2019-07-10 NOTE — Progress Notes (Signed)
Chief Complaint:   OBESITY Rhonda Aguilar is here to discuss her progress with her obesity treatment plan along with follow-up of her obesity related diagnoses. Rhonda Aguilar is on the Category 3 Plan and states she is following her eating plan approximately 50% of the time. Rhonda Aguilar states she is exercising 0 minutes 0 times per week.  Today's visit was #: 63 Starting weight: 231 lbs Starting date: 12/08/2017 Today's weight: 236 lbs Today's date: 07/10/2019 Total lbs lost to date: 0 Total lbs lost since last in-office visit: 0  Interim History: [name] states that last week she did not do well on plan. She continues to have GI issues, which limits her food choices.  Subjective:   Hypertension associated with diabetes ([place]). Rhonda Aguilar is on Jardiance. No UTI or yeast infections.  Lab Results  Component Value Date   HGBA1C 6.7 (H) 04/19/2019   Lab Results  Component Value Date   LDLCALC 139 (H) 04/19/2019   CREATININE 0.84 04/19/2019   Lab Results  Component Value Date   INSULIN 40.0 (H) 04/19/2019   INSULIN 40.9 (H) 04/12/2018   INSULIN 38.2 (H) 12/08/2017   Assessment/Plan:   Hypertension associated with diabetes ([place]). Good blood sugar control is important to decrease the likelihood of diabetic complications such as nephropathy, neuropathy, limb loss, blindness, coronary artery disease, and death. Intensive lifestyle modification including diet, exercise and weight loss are the first line of treatment for diabetes. She will continue her medication as directed.  Class 3 severe obesity with serious comorbidity and body mass index (BMI) of 40.0 to 44.9 in adult, unspecified obesity type ([place]).  Rhonda Aguilar is currently in the action stage of change. As such, her goal is to continue with weight loss efforts. She has agreed to the [employer] + 200 snack calories.   Exercise goals: Older adults should follow the adult guidelines. When older adults cannot meet the adult guidelines, they  should be as physically active as their abilities and conditions will allow.   Behavioral modification strategies: meal planning and cooking strategies and keeping healthy foods in the home.  Rhonda Aguilar has agreed to follow-up with our clinic in 2 weeks. She was informed of the importance of frequent follow-up visits to maximize her success with intensive lifestyle modifications for her multiple health conditions.   Objective:   Blood pressure 136/85, pulse 77, temperature 98.4 F (36.9 C), temperature source Oral, height 5\' 2"  (1.575 m), weight 236 lb (107 kg), SpO2 96 %. Body mass index is 43.16 kg/m.  General: Cooperative, alert, well developed, in no acute distress. HEENT: Conjunctivae and lids unremarkable. Cardiovascular: Regular rhythm.  Lungs: Normal work of breathing. Neurologic: No focal deficits.   Lab Results  Component Value Date   CREATININE 0.84 04/19/2019   BUN 26 04/19/2019   NA 140 04/19/2019   K 4.0 04/19/2019   CL 101 04/19/2019   CO2 17 (L) 04/19/2019   Lab Results  Component Value Date   ALT 74 (H) 04/19/2019   AST 55 (H) 04/19/2019   ALKPHOS 82 04/19/2019   BILITOT 0.4 04/19/2019   Lab Results  Component Value Date   HGBA1C 6.7 (H) 04/19/2019   Lab Results  Component Value Date   INSULIN 40.0 (H) 04/19/2019   INSULIN 40.9 (H) 04/12/2018   INSULIN 38.2 (H) 12/08/2017   Lab Results  Component Value Date   TSH 2.550 12/08/2017   Lab Results  Component Value Date   CHOL 229 (H) 04/19/2019   HDL  45 04/19/2019   LDLCALC 139 (H) 04/19/2019   TRIG 249 (H) 04/19/2019   Lab Results  Component Value Date   WBC 8.5 12/08/2017   HGB 12.9 12/08/2017   HCT 38.1 12/08/2017   MCV 82 12/08/2017   PLT 263 12/19/2014   No results found for: IRON, TIBC, FERRITIN  Attestation Statements:   Reviewed by clinician on day of visit: allergies, medications, problem list, medical history, surgical history, family history, social history, and previous encounter  notes.  Time spent on visit including pre-visit chart review and post-visit charting and care was 31 minutes.   IMichaelene Song, am acting as transcriptionist for [name], PA-C   I have reviewed the above documentation for accuracy and completeness, and I agree with the above. [name], PA-C

## 2019-07-14 MED FILL — VALSARTAN 320 MG TABLET: 320 | 30 days supply | Qty: 30 | Fill #1

## 2019-07-14 MED FILL — DEXILANT DR 60 MG CAPSULE: 60 | 30 days supply | Qty: 30 | Fill #1

## 2019-07-18 DIAGNOSIS — F341 Dysthymic disorder: Secondary | ICD-10-CM | POA: Diagnosis not present

## 2019-07-31 ENCOUNTER — Encounter (INDEPENDENT_AMBULATORY_CARE_PROVIDER_SITE_OTHER): Payer: Self-pay | Admitting: Physician Assistant

## 2019-07-31 ENCOUNTER — Ambulatory Visit (INDEPENDENT_AMBULATORY_CARE_PROVIDER_SITE_OTHER): Payer: 59 | Admitting: Physician Assistant

## 2019-07-31 ENCOUNTER — Other Ambulatory Visit: Payer: Self-pay

## 2019-07-31 VITALS — BP 110/78 | HR 75 | Temp 98.1°F | Ht 62.0 in | Wt 235.0 lb

## 2019-07-31 DIAGNOSIS — K219 Gastro-esophageal reflux disease without esophagitis: Secondary | ICD-10-CM | POA: Diagnosis not present

## 2019-07-31 DIAGNOSIS — R1011 Right upper quadrant pain: Secondary | ICD-10-CM | POA: Diagnosis not present

## 2019-07-31 DIAGNOSIS — Z6841 Body Mass Index (BMI) 40.0 and over, adult: Secondary | ICD-10-CM | POA: Diagnosis not present

## 2019-07-31 DIAGNOSIS — Z9189 Other specified personal risk factors, not elsewhere classified: Secondary | ICD-10-CM

## 2019-07-31 DIAGNOSIS — K5730 Diverticulosis of large intestine without perforation or abscess without bleeding: Secondary | ICD-10-CM | POA: Diagnosis not present

## 2019-07-31 DIAGNOSIS — E559 Vitamin D deficiency, unspecified: Secondary | ICD-10-CM

## 2019-07-31 DIAGNOSIS — E7849 Other hyperlipidemia: Secondary | ICD-10-CM

## 2019-07-31 DIAGNOSIS — K760 Fatty (change of) liver, not elsewhere classified: Secondary | ICD-10-CM | POA: Diagnosis not present

## 2019-07-31 MED ORDER — VITAMIN D (ERGOCALCIFEROL) 1.25 MG (50000 UNIT) PO CAPS: ORAL_CAPSULE | ORAL | 0 refills | Status: DC

## 2019-07-31 NOTE — Progress Notes (Signed)
Chief Complaint:   OBESITY Rhonda Aguilar is here to discuss her progress with her obesity treatment plan along with follow-up of her obesity related diagnoses. Rhonda Aguilar is on the Category 3 Plan and states she is following her eating plan approximately 40% of the time. Rhonda Aguilar states she is riding a stationary bike 10 minutes 2 times per week.  Today's visit was #: 38 Starting weight: 231 lbs Starting date: 12/08/2017 Today's weight: 235 lbs Today's date: 07/31/2019 Total lbs lost to date: 0 Total lbs lost since last in-office visit: 1  Interim History: Rhonda Aguilar continues to have gallbladder symptoms and therefore is having trouble getting in the food on the plan. She saw GI today who encouraged a cholecystectomy.  Subjective:   Vitamin D deficiency. Rhonda Aguilar is on prescription Vitamin D twice weekly. No nausea, vomiting, or muscle weakness. Last Vitamin D 26.2 on 04/19/2019.  Other hyperlipidemia. Rhonda Aguilar is on no medication per her preference. No chest pain.   Lab Results  Component Value Date   CHOL 229 (H) 04/19/2019   HDL 45 04/19/2019   LDLCALC 139 (H) 04/19/2019   TRIG 249 (H) 04/19/2019   Lab Results  Component Value Date   ALT 74 (H) 04/19/2019   AST 55 (H) 04/19/2019   ALKPHOS 82 04/19/2019   BILITOT 0.4 04/19/2019   The 10-year ASCVD risk score Rhonda Bussing DC Jr., et al., 2013) is: 12.2%   Values used to calculate the score:     Age: 64 years     Sex: Female     Is Non-Hispanic African American: No     Diabetic: Yes     Tobacco smoker: No     Systolic Blood Pressure: 962 mmHg     Is BP treated: Yes     HDL Cholesterol: 45 mg/dL     Total Cholesterol: 229 mg/dL  At risk for heart disease. Rhonda Aguilar is at a higher than average risk for cardiovascular disease due to obesity.   Assessment/Plan:   Vitamin D deficiency. Low Vitamin D level contributes to fatigue and are associated with obesity, breast, and colon cancer. She was given a refill on her Vitamin D, Ergocalciferol,  (DRISDOL) 1.25 MG (50000 UNIT) CAPS capsule twice weekly #10 with 0 refills and will follow-up for routine testing of Vitamin D, at least 2-3 times per year to avoid over-replacement.   Other hyperlipidemia. Cardiovascular risk and specific lipid/LDL goals reviewed.  We discussed several lifestyle modifications today and Rhonda Aguilar will continue to work on diet, exercise and weight loss efforts. Orders and follow up as documented in patient record.   Counseling Intensive lifestyle modifications are the first line treatment for this issue. . Dietary changes: Increase soluble fiber. Decrease simple carbohydrates. . Exercise changes: Moderate to vigorous-intensity aerobic activity 150 minutes per week if tolerated. . Lipid-lowering medications: see documented in medical record.  At risk for heart disease. Rhonda Aguilar was given approximately 15 minutes of coronary artery disease prevention counseling today. She is 64 y.o. female and has risk factors for heart disease including obesity. We discussed intensive lifestyle modifications today with an emphasis on specific weight loss instructions and strategies.   Repetitive spaced learning was employed today to elicit superior memory formation and behavioral change.  Class 3 severe obesity with serious comorbidity and body mass index (BMI) of 40.0 to 44.9 in adult, unspecified obesity type (Rhonda Aguilar).  Rhonda Aguilar is currently in the action stage of change. As such, her goal is to continue with weight loss efforts.  She has agreed to the Rhonda Aguilar + 200 calories.   Exercise goals: Older adults should follow the adult guidelines. When older adults cannot meet the adult guidelines, they should be as physically active as their abilities and conditions will allow.   Behavioral modification strategies: increasing lean protein intake and no skipping meals.  [name] has agreed to follow-up with our clinic fasting for labs in 2 weeks. She was informed of the importance of  frequent follow-up visits to maximize her success with intensive lifestyle modifications for her multiple health conditions.   Objective:   Blood pressure 110/78, pulse 75, temperature 98.1 F (36.7 C), temperature source Oral, height 5\' 2"  (1.575 m), weight 235 lb (106.6 kg), SpO2 96 %. Body mass index is 42.98 kg/m.  General: Cooperative, alert, well developed, in no acute distress. HEENT: Conjunctivae and lids unremarkable. Cardiovascular: Regular rhythm.  Lungs: Normal work of breathing. Neurologic: No focal deficits.   Lab Results  Component Value Date   CREATININE 0.84 04/19/2019   BUN 26 04/19/2019   NA 140 04/19/2019   K 4.0 04/19/2019   CL 101 04/19/2019   CO2 17 (L) 04/19/2019   Lab Results  Component Value Date   ALT 74 (H) 04/19/2019   AST 55 (H) 04/19/2019   ALKPHOS 82 04/19/2019   BILITOT 0.4 04/19/2019   Lab Results  Component Value Date   HGBA1C 6.7 (H) 04/19/2019   Lab Results  Component Value Date   INSULIN 40.0 (H) 04/19/2019   INSULIN 40.9 (H) 04/12/2018   INSULIN 38.2 (H) 12/08/2017   Lab Results  Component Value Date   TSH 2.550 12/08/2017   Lab Results  Component Value Date   CHOL 229 (H) 04/19/2019   HDL 45 04/19/2019   LDLCALC 139 (H) 04/19/2019   TRIG 249 (H) 04/19/2019   Lab Results  Component Value Date   WBC 8.5 12/08/2017   HGB 12.9 12/08/2017   HCT 38.1 12/08/2017   MCV 82 12/08/2017   PLT 263 12/19/2014   No results found for: IRON, TIBC, FERRITIN  Attestation Statements:   Reviewed by clinician on day of visit: allergies, medications, problem list, medical history, surgical history, family history, social history, and previous encounter notes.  ,Migdalia Dk, am acting as Location manager for [name], PA-C   I have reviewed the above documentation for accuracy and completeness, and I agree with the above. [name], PA-C

## 2019-08-08 ENCOUNTER — Ambulatory Visit: Payer: Self-pay | Admitting: Surgery

## 2019-08-08 NOTE — H&P (Signed)
Rhonda Aguilar DOB: [birth year] Divorced / Language: Cleophus Molt / Race: White Female   History of Present Illness  Patient words: This is a very pleasant 63 year old woman, who is a Designer, jewellery with [employer]. Referred by Dr. Collene Mares for diffuse abdominal and right upper quadrant pain, nausea and diarrhea (up to 10 bowel movements a day) that began in September 2020 with some relief provided by Imodium and Pepto-Bismol, IB guard. She describes the first episode after eating barbecue she noted some abdominal discomfort and decreased appetite, she went home and try to have some soup but developed nausea and diarrhea after this. The next time she had an attack was after eating some fried oysters and she again had abdominal cramping and diarrhea. She states the pain is diffuse but it does seem to settle in the right upper quadrant where it is not a pain but more an awareness of discomfort. She has also had symptoms secondary to eating dairy products such as ice cream. When she saw Dr. Collene Mares she continued to have some nausea and abdominal pain but the diarrhea had resolved. She does have reflux that is well-controlled with excellent. Her last upper endoscopy was about 10 years ago. She follows with the [facility].  Medical problems include severe obesity with a BMI of 44, acid reflux, hypertension, migraines, hepatitis steatosis, diverticulosis, diabetes, hyperlipidemia, sleep apnea, vitamin D deficiency.  US/ HIDA Dec 2020: No gallstones or wall thickening, normal common bile duct, 2 small cysts in the right liver adjacent to the gallbladder significance, hepatic steatosis. HIDA shows subjectively low normal emptying of tracer from the gallbladder following fatty meal stimulation, the cochlear gallbladder ejection fraction of 39% (normal considered to be greater than 33%.) No symptoms following Ensure ingestion. Colonoscopy October 2020 with diverticulosis and a  hyperplastic polyp.   Past Surgical History Colon Polyp Removal - Colonoscopy  Tonsillectomy   Diagnostic Studies History  Colonoscopy  within last year Mammogram  within last year Pap Smear  1-5 years ago  Allergies Codeine/Codeine Derivatives  Penicillins   Medication History ALPRAZolam (0.5MG  Tablet, Oral) Active. busPIRone HCl (5MG  Tablet, Oral) Active. Dexilant (60MG  Capsule DR, Oral) Active. Vitamin D (Ergocalciferol) (1.25 MG(50000 UT) Capsule, Oral) Active. Verapamil HCl ER (300MG  Capsule ER 24HR, Oral) Active.   Social History  Alcohol use  Occasional alcohol use. Caffeine use  Carbonated beverages, Coffee, Tea. No drug use  Tobacco use  Never smoker.  Family History  Depression  Father. Diabetes Mellitus  Mother. Heart Disease  Brother, Father, Mother. Heart disease in female family member before age 54  Hypertension  Brother, Father, Mother, Sister. Kidney Disease  Mother. Respiratory Condition  Father, Mother.  Pregnancy / Birth History  Age at menarche  34 years. Age of menopause  25-55 Gravida  1 Maternal age  9-25 Para  0  Other Problems  Anxiety Disorder  Diabetes Mellitus  Gastroesophageal Reflux Disease  Hemorrhoids  High blood pressure  Hypercholesterolemia  Migraine Headache  Sleep Apnea     Review of Systems  General Not Present- Appetite Loss, Chills, Fatigue, Fever, Night Sweats, Weight Gain and Weight Loss. Skin Not Present- Change in Wart/Mole, Dryness, Hives, Jaundice, New Lesions, Non-Healing Wounds, Rash and Ulcer. HEENT Present- Seasonal Allergies and Wears glasses/contact lenses. Not Present- Earache, Hearing Loss, Hoarseness, Nose Bleed, Oral Ulcers, Ringing in the Ears, Sinus Pain, Sore Throat, Visual Disturbances and Yellow Eyes. Respiratory Present- Snoring. Not Present- Bloody sputum, Chronic Cough, Difficulty Breathing and Wheezing. Breast  Not Present- Breast Mass, Breast Pain, Nipple  Discharge and Skin Changes. Cardiovascular Not Present- Chest Pain, Difficulty Breathing Lying Down, Leg Cramps, Palpitations, Rapid Heart Rate, Shortness of Breath and Swelling of Extremities. Gastrointestinal Present- Abdominal Pain, Hemorrhoids and Indigestion. Not Present- Bloating, Bloody Stool, Change in Bowel Habits, Chronic diarrhea, Constipation, Difficulty Swallowing, Excessive gas, Gets full quickly at meals, Nausea, Rectal Pain and Vomiting. Female Genitourinary Present- Pelvic Pain. Not Present- Frequency, Nocturia, Painful Urination and Urgency. Musculoskeletal Present- Back Pain and Joint Stiffness. Not Present- Joint Pain, Muscle Pain, Muscle Weakness and Swelling of Extremities. Neurological Present- Headaches. Not Present- Decreased Memory, Fainting, Numbness, Seizures, Tingling, Tremor, Trouble walking and Weakness. Psychiatric Present- Depression. Not Present- Anxiety, Bipolar, Change in Sleep Pattern, Fearful and Frequent crying. Endocrine Present- New Diabetes. Not Present- Cold Intolerance, Excessive Hunger, Hair Changes, Heat Intolerance and Hot flashes. Hematology Not Present- Blood Thinners, Easy Bruising, Excessive bleeding, Gland problems, HIV and Persistent Infections. All other systems negative  Vitals  Weight: 239.8 lb Height: 62in Body Surface Area: 2.06 m Body Mass Index: 43.86 kg/m  Temp.: 57F (Tympanic)  Pulse: 98 (Regular)  BP: 134/72(Sitting, Left Arm, Standard)  Physical Exam  She is alert and well-appearing Extraocular motions intact, no scleral icterus Unlabored respirations Regular rate and rhythm, nonpitting bilateral lower extremity edema (mild) Abdomen is obese, soft, nondistended, no tenderness except for mild peri-subjective point tenderness in the right upper quadrant  Assessment & Plan ABDOMINAL PAIN (R10.9) Story: While the postprandial anorexia and nausea and occasional right upper quadrant pain may be secondary to biliary  etiology, her ultrasound and HIDA were both technically normal. The diffuse abdominal cramping and diarrhea I do not think are secondary to biliary etiology. We discussed the anatomy of the biliary tract using a diagram to demonstrate and the technique of laparoscopic cholecystectomy. Discussed risks of surgery including bleeding, infection, pain, scarring, injury to intra-abdominal structures including the common bile duct and sequelae, bile leak, conversion to open surgery, cardiac/pulmonary/thromboembolic complications, all of which being very low risk. The biggest issue for her would be a question of whether this would alleviate her symptoms and I would not be able to guarantee that. If surgery were pursued and symptoms fail to resolve, would consider upper endoscopy for further evaluation of other GI etiologies with Dr. Collene Mares. Questions were answered. At this time she would like to hold off on surgery. She will call us with any issues.

## 2019-08-09 ENCOUNTER — Ambulatory Visit: Payer: 59 | Admitting: Physician Assistant

## 2019-08-09 MED FILL — SHINGRIX 50 MCG SUS: 50 | 1 days supply | Qty: 1 | Fill #0

## 2019-08-14 MED FILL — DEXILANT DR 60 MG CAPSULE: 60 | 30 days supply | Qty: 30 | Fill #2

## 2019-08-14 MED FILL — VALSARTAN 320 MG TABLET: 320 | 30 days supply | Qty: 30 | Fill #2

## 2019-08-15 ENCOUNTER — Ambulatory Visit (INDEPENDENT_AMBULATORY_CARE_PROVIDER_SITE_OTHER): Payer: 59 | Admitting: Physician Assistant

## 2019-08-16 ENCOUNTER — Ambulatory Visit
Admission: RE | Admit: 2019-08-16 | Discharge: 2019-08-16 | Disposition: A | Discharge status: Home / Self Care | Payer: 59 | Source: Ambulatory Visit | Attending: Nurse Practitioner | Admitting: Nurse Practitioner

## 2019-08-16 ENCOUNTER — Other Ambulatory Visit: Payer: Self-pay

## 2019-08-16 DIAGNOSIS — Z780 Asymptomatic menopausal state: Secondary | ICD-10-CM | POA: Diagnosis not present

## 2019-08-16 DIAGNOSIS — M85851 Other specified disorders of bone density and structure, right thigh: Secondary | ICD-10-CM | POA: Diagnosis not present

## 2019-08-16 DIAGNOSIS — Z13820 Encounter for screening for osteoporosis: Secondary | ICD-10-CM

## 2019-08-21 DIAGNOSIS — G4730 Sleep apnea, unspecified: Secondary | ICD-10-CM | POA: Diagnosis not present

## 2019-08-21 DIAGNOSIS — G4733 Obstructive sleep apnea (adult) (pediatric): Secondary | ICD-10-CM | POA: Diagnosis not present

## 2019-08-22 ENCOUNTER — Other Ambulatory Visit: Payer: Self-pay

## 2019-08-22 ENCOUNTER — Ambulatory Visit (INDEPENDENT_AMBULATORY_CARE_PROVIDER_SITE_OTHER): Payer: 59 | Admitting: Physician Assistant

## 2019-08-22 ENCOUNTER — Encounter (INDEPENDENT_AMBULATORY_CARE_PROVIDER_SITE_OTHER): Payer: Self-pay | Admitting: Physician Assistant

## 2019-08-22 VITALS — BP 135/90 | HR 71 | Temp 98.1°F | Ht 62.0 in | Wt 234.0 lb

## 2019-08-22 DIAGNOSIS — E559 Vitamin D deficiency, unspecified: Secondary | ICD-10-CM

## 2019-08-22 DIAGNOSIS — E119 Type 2 diabetes mellitus without complications: Secondary | ICD-10-CM

## 2019-08-22 DIAGNOSIS — Z6841 Body Mass Index (BMI) 40.0 and over, adult: Secondary | ICD-10-CM | POA: Diagnosis not present

## 2019-08-22 DIAGNOSIS — E785 Hyperlipidemia, unspecified: Secondary | ICD-10-CM | POA: Diagnosis not present

## 2019-08-22 DIAGNOSIS — E1169 Type 2 diabetes mellitus with other specified complication: Secondary | ICD-10-CM | POA: Diagnosis not present

## 2019-08-22 DIAGNOSIS — Z9189 Other specified personal risk factors, not elsewhere classified: Secondary | ICD-10-CM | POA: Diagnosis not present

## 2019-08-22 MED ORDER — VITAMIN D (ERGOCALCIFEROL) 1.25 MG (50000 UNIT) PO CAPS: ORAL_CAPSULE | ORAL | 0 refills | Status: DC

## 2019-08-22 NOTE — Progress Notes (Signed)
Chief Complaint:   Rhonda Aguilar is here to discuss her progress with her Rhonda treatment plan along with follow-up of her Rhonda related diagnoses. Rhonda Aguilar is on the Category 3 Plan and states she is following her eating plan approximately 40% of the time. Rhonda Aguilar states she has increased her activities.   Today's visit was #: 36 Starting weight: 231 lbs Starting date: 12/08/2017 Today's weight: 234 lbs Today's date: 08/22/2019 Total lbs lost to date: 0 Total lbs lost since last in-office visit: 1  Interim History: Rhonda Aguilar reports that she had company recently and did not eat on plan and has not been journaling consistently. She is having a cholecystectomy in 3 weeks.  Subjective:   Type 2 diabetes mellitus with hyperlipidemia (Rhonda Aguilar). Rhonda Aguilar is on Jardiance. No UTI or yeast infection. No hypoglycemia. She is due for labs.  Lab Results  Component Value Date   HGBA1C 6.7 (H) 04/19/2019   Lab Results  Component Value Date   LDLCALC 139 (H) 04/19/2019   CREATININE 0.84 04/19/2019   Lab Results  Component Value Date   INSULIN 40.0 (H) 04/19/2019   INSULIN 40.9 (H) 04/12/2018   INSULIN 38.2 (H) 12/08/2017   Vitamin D deficiency. Rhonda Aguilar is on prescription Vitamin D twice weekly. No nausea, vomiting, or muscle weakness.    Ref. Range 04/19/2019 16:07  Vitamin D, 25-Hydroxy Latest Ref Range: 30.0 - 100.0 ng/mL 26.2 (L)   At risk for diabetes mellitus. Rhonda Aguilar is at higher than average risk for developing diabetes due to her Rhonda.   Assessment/Plan:   Type 2 diabetes mellitus with hyperlipidemia (Rhonda Aguilar). Good blood sugar control is important to decrease the likelihood of diabetic complications such as nephropathy, neuropathy, limb loss, blindness, coronary artery disease, and death. Intensive lifestyle modification including diet, exercise and weight loss are the first line of treatment for diabetes. Rhonda Aguilar will continue her medication as directed. Comprehensive metabolic  panel, Hemoglobin A1c, Insulin, random, Lipid Panel With LDL/HDL Ratio labs ordered today.  Vitamin D deficiency. Low Vitamin D level contributes to fatigue and are associated with Rhonda, breast, and colon cancer. She was given a refill on her Vitamin D, Ergocalciferol, (DRISDOL) 1.25 MG (50000 UNIT) CAPS capsule twice weekly #10 and VITAMIN D 25 Hydroxy (Vit-D Deficiency, Fractures) level will be checked today.  At risk for diabetes mellitus. Rhonda Aguilar was given approximately 15 minutes of diabetes education and counseling today. We discussed intensive lifestyle modifications today with an emphasis on weight loss as well as increasing exercise and decreasing simple carbohydrates in her diet. We also reviewed medication options with an emphasis on risk versus benefit of those discussed.   Repetitive spaced learning was employed today to elicit superior memory formation and behavioral change.  Class 3 severe Rhonda with serious comorbidity and body mass index (BMI) of 40.0 to 44.9 in adult, unspecified Rhonda type (Rhonda Aguilar).  Rhonda Aguilar is currently in the action stage of change. As such, her goal is to continue with weight loss efforts. She has agreed to the Category 3 Plan.   Exercise goals: Older adults should follow the adult guidelines. When older adults cannot meet the adult guidelines, they should be as physically active as their abilities and conditions will allow.   Behavioral modification strategies: meal planning and cooking strategies and keeping healthy foods in the home.  Rhonda Aguilar has agreed to follow-up with our clinic in 4-5 weeks. She was informed of the importance of frequent follow-up visits to maximize her success with intensive lifestyle  modifications for her multiple health conditions.   Rhonda Aguilar was informed we would discuss her lab results at her next visit unless there is a critical issue that needs to be addressed sooner. Rhonda Aguilar agreed to keep her next visit at the agreed upon time to  discuss these results.  Objective:   Blood pressure 135/90, pulse 71, temperature 98.1 F (36.7 C), temperature source Oral, height 5\' 2"  (1.575 m), weight 234 lb (106.1 kg), SpO2 94 %. Body mass index is 42.8 kg/m.  General: Cooperative, alert, well developed, in no acute distress. HEENT: Conjunctivae and lids unremarkable. Cardiovascular: Regular rhythm.  Lungs: Normal work of breathing. Neurologic: No focal deficits.   Lab Results  Component Value Date   CREATININE 0.84 04/19/2019   BUN 26 04/19/2019   NA 140 04/19/2019   K 4.0 04/19/2019   CL 101 04/19/2019   CO2 17 (L) 04/19/2019   Lab Results  Component Value Date   ALT 74 (H) 04/19/2019   AST 55 (H) 04/19/2019   ALKPHOS 82 04/19/2019   BILITOT 0.4 04/19/2019   Lab Results  Component Value Date   HGBA1C 6.7 (H) 04/19/2019   Lab Results  Component Value Date   INSULIN 40.0 (H) 04/19/2019   INSULIN 40.9 (H) 04/12/2018   INSULIN 38.2 (H) 12/08/2017   Lab Results  Component Value Date   TSH 2.550 12/08/2017   Lab Results  Component Value Date   CHOL 229 (H) 04/19/2019   HDL 45 04/19/2019   LDLCALC 139 (H) 04/19/2019   TRIG 249 (H) 04/19/2019   Lab Results  Component Value Date   WBC 8.5 12/08/2017   HGB 12.9 12/08/2017   HCT 38.1 12/08/2017   MCV 82 12/08/2017   PLT 263 12/19/2014   No results found for: IRON, TIBC, FERRITIN  Attestation Statements:   Reviewed by clinician on day of visit: allergies, medications, problem list, medical history, surgical history, family history, social history, and previous encounter notes.  IMichaelene Song, am acting as transcriptionist for [name], PA-C   I have reviewed the above documentation for accuracy and completeness, and I agree with the above. [name], PA-C

## 2019-08-23 LAB — COMPREHENSIVE METABOLIC PANEL
ALT: 70 IU/L — ABNORMAL HIGH (ref 0–32)
AST: 49 IU/L — ABNORMAL HIGH (ref 0–40)
Albumin/Globulin Ratio: 1.8 (ref 1.2–2.2)
Albumin: 4.4 g/dL (ref 3.8–4.8)
Alkaline Phosphatase: 89 IU/L (ref 48–121)
BUN/Creatinine Ratio: 32 — ABNORMAL HIGH (ref 12–28)
BUN: 25 mg/dL (ref 8–27)
Bilirubin Total: 0.4 mg/dL (ref 0.0–1.2)
CO2: 21 mmol/L (ref 20–29)
Calcium: 9.3 mg/dL (ref 8.7–10.3)
Chloride: 106 mmol/L (ref 96–106)
Creatinine, Ser: 0.78 mg/dL (ref 0.57–1.00)
GFR calc Af Amer: 92 mL/min/{1.73_m2} (ref >59)
GFR calc non Af Amer: 80 mL/min/{1.73_m2} (ref >59)
Globulin, Total: 2.5 g/dL (ref 1.5–4.5)
Glucose: 130 mg/dL — ABNORMAL HIGH (ref 65–99)
Potassium: 4.2 mmol/L (ref 3.5–5.2)
Sodium: 141 mmol/L (ref 134–144)
Total Protein: 6.9 g/dL (ref 6.0–8.5)

## 2019-08-23 LAB — LIPID PANEL WITH LDL/HDL RATIO
Cholesterol, Total: 239 mg/dL — ABNORMAL HIGH (ref 100–199)
HDL: 40 mg/dL (ref >39)
LDL Chol Calc (NIH): 146 mg/dL — ABNORMAL HIGH (ref 0–99)
LDL/HDL Ratio: 3.7 ratio — ABNORMAL HIGH (ref 0.0–3.2)
Triglycerides: 288 mg/dL — ABNORMAL HIGH (ref 0–149)
VLDL Cholesterol Cal: 53 mg/dL — ABNORMAL HIGH (ref 5–40)

## 2019-08-23 LAB — HEMOGLOBIN A1C
Est. average glucose Bld gHb Est-mCnc: 154 mg/dL
Hgb A1c MFr Bld: 7 % — ABNORMAL HIGH (ref 4.8–5.6)

## 2019-08-23 LAB — VITAMIN D 25 HYDROXY (VIT D DEFICIENCY, FRACTURES): Vit D, 25-Hydroxy: 32.1 ng/mL (ref 30.0–100.0)

## 2019-08-23 LAB — INSULIN, RANDOM: INSULIN: 33.6 u[IU]/mL — ABNORMAL HIGH (ref 2.6–24.9)

## 2019-08-24 ENCOUNTER — Encounter: Payer: Self-pay | Admitting: Physician Assistant

## 2019-08-24 ENCOUNTER — Other Ambulatory Visit: Payer: Self-pay

## 2019-08-24 ENCOUNTER — Ambulatory Visit (INDEPENDENT_AMBULATORY_CARE_PROVIDER_SITE_OTHER): Payer: 59 | Admitting: Physician Assistant

## 2019-08-24 ENCOUNTER — Other Ambulatory Visit: Payer: Self-pay | Admitting: Physician Assistant

## 2019-08-24 DIAGNOSIS — F411 Generalized anxiety disorder: Secondary | ICD-10-CM | POA: Diagnosis not present

## 2019-08-24 DIAGNOSIS — G4733 Obstructive sleep apnea (adult) (pediatric): Secondary | ICD-10-CM | POA: Diagnosis not present

## 2019-08-24 DIAGNOSIS — F331 Major depressive disorder, recurrent, moderate: Secondary | ICD-10-CM

## 2019-08-24 MED ORDER — BUSPIRONE HCL 5 MG PO TABS: 5.0000 mg | ORAL_TABLET | Freq: Every day | ORAL | 1 refills | Status: DC

## 2019-08-24 MED FILL — busPIRone HCL 5 MG TABS: 5 | 90 days supply | Qty: 90 | Fill #0

## 2019-08-24 NOTE — Progress Notes (Signed)
[facility]  Patient ID: Rhonda Aguilar,  MRN: [id number]  PCP: Glenis Smoker, MD  Date of Evaluation: 08/24/2019 Time spent:20 minutes  Chief Complaint:  Chief Complaint    Follow-up      HISTORY/CURRENT STATUS: HPI For f/u of anxiety.  Anxiety is better. Not scratching her head as much as she did. She decreased the Buspar and is doing fine. Work is going well.   Patient denies loss of interest in usual activities and is able to enjoy things.  Denies decreased energy or motivation.  Appetite has not changed.  No extreme sadness, tearfulness, or feelings of hopelessness.  Denies any changes in concentration, making decisions or remembering things.  Denies suicidal or homicidal thoughts.  Patient denies increased energy with decreased need for sleep, no increased talkativeness, no racing thoughts, no impulsivity or risky behaviors, no increased spending, no increased libido, no grandiosity, no increased irritability or anger, and no hallucinations.  Denies dizziness, syncope, seizures, numbness, tingling, tremor, unsteady gait, slurred speech, confusion. Denies muscle or joint pain, stiffness, or dystonia.  Individual Medical History/ Review of Systems: Changes? :Yes  now has osteopenia  Past medications for mental health diagnoses include: Pristiq, Zoloft caused her to be more depressed.  Allergies: Glyburide, Metformin and related, Saxenda [liraglutide -weight management], Codeine, and Penicillins  Current Medications:  Current Outpatient Medications:  .  ALPRAZolam (XANAX) 0.5 MG tablet, Take 0.5-2 tablets (0.25-1 mg total) by mouth every 8 (eight) hours as needed for anxiety., Disp: 30 tablet, Rfl: 0 .  busPIRone (BUSPAR) 5 MG tablet, Take 1 tablet (5 mg total) by mouth daily., Disp: 90 tablet, Rfl: 1 .  cetirizine (ZYRTEC) 10 MG tablet, Take 10 mg by mouth at bedtime. , Disp: , Rfl:  .  dexlansoprazole (DEXILANT) 60 MG capsule, Take 60 mg by mouth daily.,  Disp: , Rfl:  .  empagliflozin (JARDIANCE) 25 MG TABS tablet, Take 25 mg by mouth daily., Disp: , Rfl:  .  ibuprofen (ADVIL) 200 MG tablet, Take 600 mg by mouth every 8 (eight) hours as needed for mild pain or moderate pain. , Disp: , Rfl:  .  Probiotic Product (ALIGN) 4 MG CAPS, Take 1 capsule by mouth at bedtime. , Disp: , Rfl:  .  valsartan (DIOVAN) 320 MG tablet, Take 320 mg by mouth daily. , Disp: , Rfl:  .  Verapamil HCl CR 300 MG CP24, Take 300 mg by mouth at bedtime. , Disp: , Rfl:  .  Vitamin D, Ergocalciferol, (DRISDOL) 1.25 MG (50000 UNIT) CAPS capsule, TAKE 1 CAPSULE BY MOUTH (TWICE) WEEKLY (Patient taking differently: Take 50,000 Units by mouth 2 (two) times a week. TAKE 1 CAPSULE BY MOUTH (TWICE) WEEKLY), Disp: 10 capsule, Rfl: 0 Medication Side Effects: none  Family Medical/ Social History: Changes? No  MENTAL HEALTH EXAM:  There were no vitals taken for this visit.There is no height or weight on file to calculate BMI.  General Appearance: Casual, Neat, Well Groomed and Obese  Eye Contact:  Good  Speech:  Clear and Coherent and Normal Rate  Volume:  Normal  Mood:  Euthymic  Affect:  Appropriate  Thought Process:  Goal Directed and Descriptions of Associations: Intact  Orientation:  Full (Time, Place, and Person)  Thought Content: Logical   Suicidal Thoughts:  No  Homicidal Thoughts:  No  Memory:  WNL  Judgement:  Good  Insight:  Good  Psychomotor Activity:  Normal  Concentration:  Concentration: Good and Attention Span: Good  Recall:  Good  Fund of Knowledge: Good  Language: Good  Assets:  Desire for Improvement  ADL's:  Intact  Cognition: WNL  Prognosis:  Good    DIAGNOSES:    ICD-10-CM   1. Generalized anxiety disorder  F41.1   2. Major depressive disorder, recurrent episode, moderate ([name])  F33.1   3. Obstructive sleep apnea  G47.33     Receiving Psychotherapy: No    RECOMMENDATIONS:  PDMP reviewed. I provided 20 mins face-to-face care with  her. Continue BuSpar 5 mg, qd.  Continue Xanax 0.5 mg, 1/2-1 p.o. 3 times daily as needed. Return in 6 months.  Donnal Moat, PA-C

## 2019-08-28 MED FILL — VIT D2 1.25 MG (50,000 UNIT: 1.25 MG | 28 days supply | Qty: 8 | Fill #0

## 2019-08-29 ENCOUNTER — Other Ambulatory Visit (HOSPITAL_COMMUNITY): Payer: Self-pay | Admitting: Family Medicine

## 2019-08-29 MED FILL — VERAPAMIL ER PM 300 MG CAP: 300 | 90 days supply | Qty: 90 | Fill #0

## 2019-09-04 NOTE — Patient Instructions (Addendum)
DUE TO COVID-19 ONLY ONE VISITOR IS ALLOWED TO COME WITH YOU AND STAY IN THE WAITING ROOM ONLY DURING PRE OP AND PROCEDURE DAY OF SURGERY. THE 1 VISITOR MAY VISIT WITH YOU AFTER SURGERY IN YOUR PRIVATE ROOM DURING VISITING HOURS ONLY!                  ROSALEE TOLLEY    Your procedure is scheduled on: 09/14/19   Report to [facility] Main  Entrance   Report to admitting at: 7:30 AM     Call this number if you have problems the morning of surgery [phone]    Remember: Do not eat food or drink liquids :After Midnight.   BRUSH YOUR TEETH MORNING OF SURGERY AND RINSE YOUR MOUTH OUT, NO CHEWING GUM CANDY OR MINTS.     Take these medicines the morning of surgery with A SIP OF WATER: Alprazolam as needed,buspirone,cetirizine,dexlansoprazole.  How to Manage Your Diabetes Before and After Surgery  Why is it important to control my blood sugar before and after surgery? . Improving blood sugar levels before and after surgery helps healing and can limit problems. . A way of improving blood sugar control is eating a healthy diet by: o  Eating less sugar and carbohydrates o  Increasing activity/exercise o  Talking with your doctor about reaching your blood sugar goals . High blood sugars (greater than 180 mg/dL) can raise your risk of infections and slow your recovery, so you will need to focus on controlling your diabetes during the weeks before surgery. . Make sure that the doctor who takes care of your diabetes knows about your planned surgery including the date and location.  How do I manage my blood sugar before surgery? . Check your blood sugar at least 4 times a day, starting 2 days before surgery, to make sure that the level is not too high or low. o Check your blood sugar the morning of your surgery when you wake up and every 2 hours until you get to the Short Stay unit. . If your blood sugar is less than 70 mg/dL, you will need to treat for low blood sugar: o Do not take  insulin. o Treat a low blood sugar (less than 70 mg/dL) with  cup of clear juice (cranberry or apple), 4 glucose tablets, OR glucose gel. o Recheck blood sugar in 15 minutes after treatment (to make sure it is greater than 70 mg/dL). If your blood sugar is not greater than 70 mg/dL on recheck, call [phone] for further instructions. . Report your blood sugar to the short stay nurse when you get to Short Stay.  . If you are admitted to the hospital after surgery: o Your blood sugar will be checked by the staff and you will probably be given insulin after surgery (instead of oral diabetes medicines) to make sure you have good blood sugar levels. o The goal for blood sugar control after surgery is 80-180 mg/dL.   WHAT DO I DO ABOUT MY DIABETES MEDICATION?  [name] Do not take oral diabetes medicines (pills) the morning of surgery.  . THE DAY BEFORE SURGERY, DO NOT take Jardiance.          DO NOT TAKE ANY DIABETIC MEDICATIONS DAY OF YOUR SURGERY                               You may not have any metal on your  body including hair pins and              piercings  Do not wear jewelry, make-up, lotions, powders or perfumes, deodorant             Do not wear nail polish on your fingernails.  Do not shave  48 hours prior to surgery.            Do not bring valuables to the hospital. [place].  Contacts, dentures or bridgework may not be worn into surgery.  Leave suitcase in the car. After surgery it may be brought to your room.     Patients discharged the day of surgery will not be allowed to drive home. IF YOU ARE HAVING SURGERY AND GOING HOME THE SAME DAY, YOU MUST HAVE AN ADULT TO DRIVE YOU HOME AND BE WITH YOU FOR 24 HOURS. YOU MAY GO HOME BY TAXI OR UBER OR ORTHERWISE, BUT AN ADULT MUST ACCOMPANY YOU HOME AND STAY WITH YOU FOR 24 HOURS.  Name and phone number of your driver:  Special Instructions: N/A              Please read over the  following fact sheets you were given: _____________________________________________________________________         [facility] - Preparing for Surgery Before surgery, you can play an important role.  Because skin is not sterile, your skin needs to be as free of germs as possible.  You can reduce the number of germs on your skin by washing with CHG (chlorahexidine gluconate) soap before surgery.  CHG is an antiseptic cleaner which kills germs and bonds with the skin to continue killing germs even after washing. Please DO NOT use if you have an allergy to CHG or antibacterial soaps.  If your skin becomes reddened/irritated stop using the CHG and inform your nurse when you arrive at Short Stay. Do not shave (including legs and underarms) for at least 48 hours prior to the first CHG shower.  You may shave your face/neck. Please follow these instructions carefully:  1.  Shower with CHG Soap the night before surgery and the  morning of Surgery.  2.  If you choose to wash your hair, wash your hair first as usual with your  normal  shampoo.  3.  After you shampoo, rinse your hair and body thoroughly to remove the  shampoo.                           4.  Use CHG as you would any other liquid soap.  You can apply chg directly  to the skin and wash                       Gently with a scrungie or clean washcloth.  5.  Apply the CHG Soap to your body ONLY FROM THE NECK DOWN.   Do not use on face/ open                           Wound or open sores. Avoid contact with eyes, ears mouth and genitals (private parts).                       Wash face,  Genitals (private parts) with your  normal soap.             6.  Wash thoroughly, paying special attention to the area where your surgery  will be performed.  7.  Thoroughly rinse your body with warm water from the neck down.  8.  DO NOT shower/wash with your normal soap after using and rinsing off  the CHG Soap.                9.  Pat yourself dry with a clean towel.             10.  Wear clean pajamas.            11.  Place clean sheets on your bed the night of your first shower and do not  sleep with pets. Day of Surgery : Do not apply any lotions/deodorants the morning of surgery.  Please wear clean clothes to the hospital/surgery center.  FAILURE TO FOLLOW THESE INSTRUCTIONS MAY RESULT IN THE CANCELLATION OF YOUR SURGERY PATIENT SIGNATURE_________________________________  NURSE SIGNATURE__________________________________  ________________________________________________________________________

## 2019-09-05 ENCOUNTER — Encounter (HOSPITAL_COMMUNITY)
Admission: RE | Admit: 2019-09-05 | Discharge: 2019-09-05 | Disposition: A | Discharge status: Home / Self Care | Payer: 59 | Source: Ambulatory Visit | Attending: Surgery | Admitting: Surgery

## 2019-09-05 ENCOUNTER — Other Ambulatory Visit: Payer: Self-pay

## 2019-09-05 ENCOUNTER — Encounter (HOSPITAL_COMMUNITY): Payer: Self-pay

## 2019-09-05 DIAGNOSIS — Z01818 Encounter for other preprocedural examination: Secondary | ICD-10-CM | POA: Diagnosis not present

## 2019-09-05 HISTORY — DX: Pneumonia, unspecified organism: J18.9

## 2019-09-05 HISTORY — DX: Unspecified osteoarthritis, unspecified site: M19.90

## 2019-09-05 HISTORY — DX: Anxiety disorder, unspecified: F41.9

## 2019-09-05 HISTORY — DX: Dyspnea, unspecified: R06.00

## 2019-09-05 LAB — BASIC METABOLIC PANEL
Anion gap: 14 (ref 5–15)
BUN: 21 mg/dL (ref 8–23)
CO2: 20 mmol/L — ABNORMAL LOW (ref 22–32)
Calcium: 9 mg/dL (ref 8.9–10.3)
Chloride: 106 mmol/L (ref 98–111)
Creatinine, Ser: 0.72 mg/dL (ref 0.44–1.00)
GFR calc Af Amer: >60 mL/min (ref >60)
GFR calc non Af Amer: >60 mL/min (ref >60)
Glucose, Bld: 192 mg/dL — ABNORMAL HIGH (ref 70–99)
Potassium: 4.1 mmol/L (ref 3.5–5.1)
Sodium: 140 mmol/L (ref 135–145)

## 2019-09-05 LAB — CBC
HCT: 47 % — ABNORMAL HIGH (ref 36.0–46.0)
Hemoglobin: 15 g/dL (ref 12.0–15.0)
MCH: 27.1 pg (ref 26.0–34.0)
MCHC: 31.9 g/dL (ref 30.0–36.0)
MCV: 85 fL (ref 80.0–100.0)
Platelets: 228 10*3/uL (ref 150–400)
RBC: 5.53 MIL/uL — ABNORMAL HIGH (ref 3.87–5.11)
RDW: 15.4 % (ref 11.5–15.5)
WBC: 7.6 10*3/uL (ref 4.0–10.5)
nRBC: 0 % (ref 0.0–0.2)

## 2019-09-05 LAB — GLUCOSE, CAPILLARY: Glucose-Capillary: 197 mg/dL — ABNORMAL HIGH (ref 70–99)

## 2019-09-05 MED FILL — JARDIANCE 25 MG TABLET: 25 | 90 days supply | Qty: 90 | Fill #1

## 2019-09-05 NOTE — Progress Notes (Addendum)
COVID Vaccine Completed:yes Date COVID Vaccine completed:02/2019 COVID vaccine manufacturer: *[place]   PCP - Dr. Sela Hilding. LOV: 08/22/19 Cardiologist -   Chest x-ray -  EKG -  Stress Test -  ECHO -  Cardiac Cath -  A1C: 7.0 : 08/22/19. EPIC Sleep Study - yes CPAP - yes  Fasting Blood Sugar - 140's Checks Blood Sugar __1___ times a day  Blood Thinner Instructions: Aspirin Instructions: Last Dose:  Anesthesia review: Hx. OAS(Cpap),DIA,HTN.Pt. said she does not walk too much because she gets SOB on exertion. BP: 146/103 at the PST appointment,Angela PA was notified.As per pt. She ate some salty food last night and she believes, that's the cause of her BP to be high today.  Patient denies shortness of breath, fever, cough and chest pain at PAT appointment   Patient verbalized understanding of instructions that were given to them at the PAT appointment. Patient was also instructed that they will need to review over the PAT instructions again at home before surgery.

## 2019-09-11 ENCOUNTER — Other Ambulatory Visit (HOSPITAL_COMMUNITY)
Admission: RE | Admit: 2019-09-11 | Discharge: 2019-09-11 | Disposition: A | Discharge status: Home / Self Care | Payer: 59 | Source: Ambulatory Visit | Attending: Surgery | Admitting: Surgery

## 2019-09-11 ENCOUNTER — Other Ambulatory Visit (HOSPITAL_COMMUNITY): Payer: Self-pay | Admitting: Family Medicine

## 2019-09-11 DIAGNOSIS — F341 Dysthymic disorder: Secondary | ICD-10-CM | POA: Diagnosis not present

## 2019-09-11 DIAGNOSIS — Z20822 Contact with and (suspected) exposure to covid-19: Secondary | ICD-10-CM | POA: Diagnosis not present

## 2019-09-11 DIAGNOSIS — Z01812 Encounter for preprocedural laboratory examination: Secondary | ICD-10-CM | POA: Diagnosis not present

## 2019-09-11 LAB — SARS CORONAVIRUS 2 (TAT 6-24 HRS): SARS Coronavirus 2: NEGATIVE

## 2019-09-11 MED FILL — VALSARTAN 320 MG TABLET: 320 | 90 days supply | Qty: 90 | Fill #0

## 2019-09-11 MED FILL — HYDROCHLOROTHIAZIDE 25 MG T: 25 | 30 days supply | Qty: 30 | Fill #0

## 2019-09-13 MED ORDER — VANCOMYCIN HCL 1500 MG/300ML IV SOLN
1500.0000 mg | INTRAVENOUS | Status: AC
  Administered 2019-09-14: 1500 mg via INTRAVENOUS
  Filled 2019-09-13: qty 300

## 2019-09-13 MED ORDER — BUPIVACAINE LIPOSOME 1.3 % IJ SUSP
20.0000 mL | Freq: Once | INTRAMUSCULAR | Status: DC
  Filled 2019-09-13: qty 20

## 2019-09-14 ENCOUNTER — Ambulatory Visit (HOSPITAL_COMMUNITY)
Admission: RE | Admit: 2019-09-14 | Discharge: 2019-09-14 | Disposition: A | Discharge status: Home / Self Care | Payer: 59 | Source: Ambulatory Visit | Attending: Surgery | Admitting: Surgery

## 2019-09-14 ENCOUNTER — Encounter (HOSPITAL_COMMUNITY)
Admission: RE | Disposition: A | Discharge status: Home / Self Care | Payer: Self-pay | Source: Ambulatory Visit | Attending: Surgery

## 2019-09-14 ENCOUNTER — Ambulatory Visit (HOSPITAL_COMMUNITY): Payer: 59 | Admitting: Certified Registered Nurse Anesthetist

## 2019-09-14 ENCOUNTER — Encounter (HOSPITAL_COMMUNITY): Payer: Self-pay | Admitting: Surgery

## 2019-09-14 DIAGNOSIS — Z6841 Body Mass Index (BMI) 40.0 and over, adult: Secondary | ICD-10-CM | POA: Diagnosis not present

## 2019-09-14 DIAGNOSIS — Z79899 Other long term (current) drug therapy: Secondary | ICD-10-CM | POA: Diagnosis not present

## 2019-09-14 DIAGNOSIS — K811 Chronic cholecystitis: Secondary | ICD-10-CM | POA: Insufficient documentation

## 2019-09-14 DIAGNOSIS — K828 Other specified diseases of gallbladder: Secondary | ICD-10-CM | POA: Diagnosis not present

## 2019-09-14 DIAGNOSIS — F329 Major depressive disorder, single episode, unspecified: Secondary | ICD-10-CM | POA: Insufficient documentation

## 2019-09-14 DIAGNOSIS — E119 Type 2 diabetes mellitus without complications: Secondary | ICD-10-CM | POA: Insufficient documentation

## 2019-09-14 DIAGNOSIS — E559 Vitamin D deficiency, unspecified: Secondary | ICD-10-CM | POA: Diagnosis not present

## 2019-09-14 DIAGNOSIS — G4730 Sleep apnea, unspecified: Secondary | ICD-10-CM | POA: Diagnosis not present

## 2019-09-14 DIAGNOSIS — F419 Anxiety disorder, unspecified: Secondary | ICD-10-CM | POA: Insufficient documentation

## 2019-09-14 DIAGNOSIS — G4733 Obstructive sleep apnea (adult) (pediatric): Secondary | ICD-10-CM | POA: Diagnosis not present

## 2019-09-14 DIAGNOSIS — K219 Gastro-esophageal reflux disease without esophagitis: Secondary | ICD-10-CM | POA: Insufficient documentation

## 2019-09-14 DIAGNOSIS — K8064 Calculus of gallbladder and bile duct with chronic cholecystitis without obstruction: Secondary | ICD-10-CM | POA: Diagnosis not present

## 2019-09-14 DIAGNOSIS — I10 Essential (primary) hypertension: Secondary | ICD-10-CM | POA: Insufficient documentation

## 2019-09-14 DIAGNOSIS — R109 Unspecified abdominal pain: Secondary | ICD-10-CM | POA: Diagnosis present

## 2019-09-14 HISTORY — PX: CHOLECYSTECTOMY: SHX55

## 2019-09-14 LAB — GLUCOSE, CAPILLARY: Glucose-Capillary: 201 mg/dL — ABNORMAL HIGH (ref 70–99)

## 2019-09-14 SURGERY — LAPAROSCOPIC CHOLECYSTECTOMY
Anesthesia: General

## 2019-09-14 MED ORDER — ONDANSETRON HCL 4 MG/2ML IJ SOLN
INTRAMUSCULAR | Status: AC
  Filled 2019-09-14: qty 2

## 2019-09-14 MED ORDER — LIDOCAINE 2% (20 MG/ML) 5 ML SYRINGE
INTRAMUSCULAR | Status: AC
  Filled 2019-09-14: qty 5

## 2019-09-14 MED ORDER — LACTATED RINGERS IV SOLN: INTRAVENOUS | Status: DC

## 2019-09-14 MED ORDER — MIDAZOLAM HCL 5 MG/5ML IJ SOLN
INTRAMUSCULAR | Status: DC | PRN
  Administered 2019-09-14: 2 mg via INTRAVENOUS

## 2019-09-14 MED ORDER — SUGAMMADEX SODIUM 500 MG/5ML IV SOLN
INTRAVENOUS | Status: DC | PRN
Start: 2019-09-14 — End: 2019-09-14
  Administered 2019-09-14: 300 mg via INTRAVENOUS

## 2019-09-14 MED ORDER — FENTANYL CITRATE (PF) 100 MCG/2ML IJ SOLN
25.0000 ug | INTRAMUSCULAR | Status: DC | PRN
  Administered 2019-09-14: 50 ug via INTRAVENOUS

## 2019-09-14 MED ORDER — CHLORHEXIDINE GLUCONATE 4 % EX LIQD: 60.0000 mL | Freq: Once | CUTANEOUS | Status: DC

## 2019-09-14 MED ORDER — ACETAMINOPHEN 500 MG PO TABS
1000.0000 mg | ORAL_TABLET | ORAL | Status: AC
  Administered 2019-09-14: 1000 mg via ORAL
  Filled 2019-09-14: qty 2

## 2019-09-14 MED ORDER — MIDAZOLAM HCL 2 MG/2ML IJ SOLN
INTRAMUSCULAR | Status: AC
  Filled 2019-09-14: qty 2

## 2019-09-14 MED ORDER — INDOCYANINE GREEN 25 MG IV SOLR
2.5000 mg | Freq: Once | INTRAVENOUS | Status: AC
  Administered 2019-09-14: 2.5 mg via INTRAVENOUS
  Filled 2019-09-14: qty 1

## 2019-09-14 MED ORDER — OXYCODONE HCL 5 MG PO TABS: 5.0000 mg | ORAL_TABLET | ORAL | Status: DC | PRN

## 2019-09-14 MED ORDER — DEXAMETHASONE SODIUM PHOSPHATE 10 MG/ML IJ SOLN
INTRAMUSCULAR | Status: AC
  Filled 2019-09-14: qty 1

## 2019-09-14 MED ORDER — FENTANYL CITRATE (PF) 250 MCG/5ML IJ SOLN
INTRAMUSCULAR | Status: AC
  Filled 2019-09-14: qty 5

## 2019-09-14 MED ORDER — FENTANYL CITRATE (PF) 100 MCG/2ML IJ SOLN
INTRAMUSCULAR | Status: AC
  Administered 2019-09-14: 50 ug via INTRAVENOUS
  Filled 2019-09-14: qty 2

## 2019-09-14 MED ORDER — PROPOFOL 10 MG/ML IV BOLUS
INTRAVENOUS | Status: AC
  Filled 2019-09-14: qty 20

## 2019-09-14 MED ORDER — LIDOCAINE 2% (20 MG/ML) 5 ML SYRINGE
INTRAMUSCULAR | Status: DC | PRN
  Administered 2019-09-14: 80 mg via INTRAVENOUS

## 2019-09-14 MED ORDER — FENTANYL CITRATE (PF) 250 MCG/5ML IJ SOLN
INTRAMUSCULAR | Status: DC | PRN
  Administered 2019-09-14 (×5): 50 ug via INTRAVENOUS

## 2019-09-14 MED ORDER — ORAL CARE MOUTH RINSE: 15.0000 mL | Freq: Once | OROMUCOSAL | Status: AC

## 2019-09-14 MED ORDER — ROCURONIUM BROMIDE 10 MG/ML (PF) SYRINGE
PREFILLED_SYRINGE | INTRAVENOUS | Status: DC | PRN
  Administered 2019-09-14: 50 mg via INTRAVENOUS
  Administered 2019-09-14: 10 mg via INTRAVENOUS

## 2019-09-14 MED ORDER — GABAPENTIN 300 MG PO CAPS
300.0000 mg | ORAL_CAPSULE | ORAL | Status: AC
  Administered 2019-09-14: 300 mg via ORAL
  Filled 2019-09-14: qty 1

## 2019-09-14 MED ORDER — BUPIVACAINE-EPINEPHRINE 0.25% -1:200000 IJ SOLN
INTRAMUSCULAR | Status: DC | PRN
  Administered 2019-09-14: 50 mL

## 2019-09-14 MED ORDER — DEXAMETHASONE SODIUM PHOSPHATE 10 MG/ML IJ SOLN
INTRAMUSCULAR | Status: DC | PRN
  Administered 2019-09-14: 4 mg via INTRAVENOUS

## 2019-09-14 MED ORDER — ONDANSETRON HCL 4 MG/2ML IJ SOLN: 4.0000 mg | Freq: Four times a day (QID) | INTRAMUSCULAR | Status: DC | PRN

## 2019-09-14 MED ORDER — PROPOFOL 10 MG/ML IV BOLUS
INTRAVENOUS | Status: DC | PRN
  Administered 2019-09-14: 130 mg via INTRAVENOUS
  Administered 2019-09-14: 25 mg via INTRAVENOUS

## 2019-09-14 MED ORDER — OXYCODONE HCL 5 MG/5ML PO SOLN: 5.0000 mg | Freq: Once | ORAL | Status: AC | PRN

## 2019-09-14 MED ORDER — BUPIVACAINE-EPINEPHRINE 0.25% -1:200000 IJ SOLN
INTRAMUSCULAR | Status: AC
  Filled 2019-09-14: qty 1

## 2019-09-14 MED ORDER — CHLORHEXIDINE GLUCONATE 0.12 % MT SOLN
15.0000 mL | Freq: Once | OROMUCOSAL | Status: AC
  Administered 2019-09-14: 15 mL via OROMUCOSAL

## 2019-09-14 MED ORDER — FENTANYL CITRATE (PF) 100 MCG/2ML IJ SOLN: 25.0000 ug | INTRAMUSCULAR | Status: DC | PRN

## 2019-09-14 MED ORDER — SODIUM CHLORIDE 0.9% FLUSH: 3.0000 mL | INTRAVENOUS | Status: DC | PRN

## 2019-09-14 MED ORDER — OXYCODONE HCL 5 MG PO TABS: 5.0000 mg | ORAL_TABLET | Freq: Once | ORAL | Status: AC | PRN

## 2019-09-14 MED ORDER — ACETAMINOPHEN 650 MG RE SUPP
650.0000 mg | RECTAL | Status: DC | PRN
  Filled 2019-09-14: qty 1

## 2019-09-14 MED ORDER — OXYCODONE HCL 5 MG PO TABS
ORAL_TABLET | ORAL | Status: AC
  Administered 2019-09-14: 5 mg via ORAL
  Filled 2019-09-14: qty 1

## 2019-09-14 MED ORDER — ROCURONIUM BROMIDE 10 MG/ML (PF) SYRINGE
PREFILLED_SYRINGE | INTRAVENOUS | Status: AC
  Filled 2019-09-14: qty 10

## 2019-09-14 MED ORDER — SODIUM CHLORIDE 0.9% FLUSH: 3.0000 mL | Freq: Two times a day (BID) | INTRAVENOUS | Status: DC

## 2019-09-14 MED ORDER — TRAMADOL HCL 50 MG PO TABS: 50.0000 mg | ORAL_TABLET | Freq: Four times a day (QID) | ORAL | 0 refills | Status: DC | PRN

## 2019-09-14 MED ORDER — SODIUM CHLORIDE 0.9 % IV SOLN: 250.0000 mL | INTRAVENOUS | Status: DC | PRN

## 2019-09-14 MED ORDER — LACTATED RINGERS IV SOLN
INTRAVENOUS | Status: DC | PRN
  Administered 2019-09-14: 1000 mL

## 2019-09-14 MED ORDER — ACETAMINOPHEN 325 MG PO TABS: 650.0000 mg | ORAL_TABLET | ORAL | Status: DC | PRN

## 2019-09-14 MED ORDER — PHENYLEPHRINE 40 MCG/ML (10ML) SYRINGE FOR IV PUSH (FOR BLOOD PRESSURE SUPPORT)
PREFILLED_SYRINGE | INTRAVENOUS | Status: DC | PRN
  Administered 2019-09-14: 80 ug via INTRAVENOUS

## 2019-09-14 MED FILL — traMADol HCL 50 MG TABS: 50 | 4 days supply | Qty: 15 | Fill #0

## 2019-09-14 SURGICAL SUPPLY — 40 items
ADH SKN CLS APL DERMABOND .7 (GAUZE/BANDAGES/DRESSINGS) ×1
APL PRP STRL LF DISP 70% ISPRP (MISCELLANEOUS) ×1
APPLIER CLIP ROT 10 11.4 M/L (STAPLE) ×2
APR CLP MED LRG 11.4X10 (STAPLE) ×1
BAG SPEC RTRVL LRG 6X4 10 (ENDOMECHANICALS) ×1
CABLE HIGH FREQUENCY MONO STRZ (ELECTRODE) ×2 IMPLANT
CHLORAPREP W/TINT 26 (MISCELLANEOUS) ×2 IMPLANT
CLIP APPLIE ROT 10 11.4 M/L (STAPLE) ×1 IMPLANT
COVER MAYO STAND STRL (DRAPES) IMPLANT
COVER SURGICAL LIGHT HANDLE (MISCELLANEOUS) ×2 IMPLANT
COVER WAND RF STERILE (DRAPES) IMPLANT
DECANTER SPIKE VIAL GLASS SM (MISCELLANEOUS) ×2 IMPLANT
DERMABOND ADVANCED (GAUZE/BANDAGES/DRESSINGS) ×1
DERMABOND ADVANCED .7 DNX12 (GAUZE/BANDAGES/DRESSINGS) ×1 IMPLANT
DRAPE C-ARM 42X120 X-RAY (DRAPES) IMPLANT
ELECT REM PT RETURN 15FT ADLT (MISCELLANEOUS) ×2 IMPLANT
ENDOLOOP SUT PDS II  0 18 (SUTURE) ×2
ENDOLOOP SUT PDS II 0 18 (SUTURE) IMPLANT
GLOVE BIO SURGEON STRL SZ 6 (GLOVE) ×2 IMPLANT
GLOVE INDICATOR 6.5 STRL GRN (GLOVE) ×2 IMPLANT
GOWN STRL REUS W/TWL LRG LVL3 (GOWN DISPOSABLE) ×2 IMPLANT
GOWN STRL REUS W/TWL XL LVL3 (GOWN DISPOSABLE) ×4 IMPLANT
GRASPER SUT TROCAR 14GX15 (MISCELLANEOUS) ×2 IMPLANT
HEMOSTAT SNOW SURGICEL 2X4 (HEMOSTASIS) IMPLANT
KIT BASIN OR (CUSTOM PROCEDURE TRAY) ×2 IMPLANT
KIT TURNOVER KIT A (KITS) IMPLANT
NEEDLE INSUFFLATION 14GA 120MM (NEEDLE) ×2 IMPLANT
PENCIL SMOKE EVACUATOR (MISCELLANEOUS) IMPLANT
POUCH SPECIMEN RETRIEVAL 10MM (ENDOMECHANICALS) ×2 IMPLANT
SCISSORS LAP 5X35 DISP (ENDOMECHANICALS) ×2 IMPLANT
SET CHOLANGIOGRAPH MIX (MISCELLANEOUS) IMPLANT
SET IRRIG TUBING LAPAROSCOPIC (IRRIGATION / IRRIGATOR) ×2 IMPLANT
SET TUBE SMOKE EVAC HIGH FLOW (TUBING) ×2 IMPLANT
SLEEVE XCEL OPT CAN 5 100 (ENDOMECHANICALS) ×4 IMPLANT
SUT MNCRL AB 4-0 PS2 18 (SUTURE) ×2 IMPLANT
TOWEL OR 17X26 10 PK STRL BLUE (TOWEL DISPOSABLE) ×2 IMPLANT
TOWEL OR NON WOVEN STRL DISP B (DISPOSABLE) IMPLANT
TRAY LAPAROSCOPIC (CUSTOM PROCEDURE TRAY) ×2 IMPLANT
TROCAR BLADELESS OPT 5 100 (ENDOMECHANICALS) ×2 IMPLANT
TROCAR XCEL 12X100 BLDLESS (ENDOMECHANICALS) ×2 IMPLANT

## 2019-09-14 NOTE — Transfer of Care (Signed)
Immediate Anesthesia Transfer of Care Note  Patient: Rhonda Aguilar  Procedure(s) Performed: LAPAROSCOPIC CHOLECYSTECTOMY (N/A )  Patient Location: PACU  Anesthesia Type:General  Level of Consciousness: awake, alert  and patient cooperative  Airway & Oxygen Therapy: Patient Spontanous Breathing and Patient connected to face mask oxygen  Post-op Assessment: Report given to RN and Post -op Vital signs reviewed and stable  Post vital signs: Reviewed and stable  Last Vitals:  Vitals Value Taken Time  BP    Temp 36.4 C 09/14/19 1002  Pulse 77 09/14/19 1010  Resp 19 09/14/19 1008  SpO2 99 % 09/14/19 1010  Vitals shown include unvalidated device data.  Last Pain:  Vitals:   09/14/19 0750  TempSrc: Oral         Complications: No complications documented.

## 2019-09-14 NOTE — Anesthesia Preprocedure Evaluation (Signed)
Anesthesia Evaluation  Patient identified by MRN, date of birth, ID band Patient awake    Reviewed: Allergy & Precautions, H&P , NPO status , Patient's Chart, lab work & pertinent test results  Airway Mallampati: II   Neck ROM: full    Dental   Pulmonary shortness of breath, sleep apnea ,    breath sounds clear to auscultation       Cardiovascular hypertension, + DOE   Rhythm:regular Rate:Normal     Neuro/Psych  Headaches, PSYCHIATRIC DISORDERS Anxiety Depression    GI/Hepatic GERD  ,  Endo/Other  diabetes, Type 2  Renal/GU      Musculoskeletal  (+) Arthritis ,   Abdominal   Peds  Hematology   Anesthesia Other Findings   Reproductive/Obstetrics                             Anesthesia Physical Anesthesia Plan  ASA: II  Anesthesia Plan: General   Post-op Pain Management:    Induction: Intravenous  PONV Risk Score and Plan: 3 and Ondansetron, Dexamethasone, Midazolam and Treatment may vary due to age or medical condition  Airway Management Planned: Oral ETT  Additional Equipment:   Intra-op Plan:   Post-operative Plan: Extubation in OR  Informed Consent: I have reviewed the patients History and Physical, chart, labs and discussed the procedure including the risks, benefits and alternatives for the proposed anesthesia with the patient or authorized representative who has indicated his/her understanding and acceptance.       Plan Discussed with: CRNA, Anesthesiologist and Surgeon  Anesthesia Plan Comments:         Anesthesia Quick Evaluation

## 2019-09-14 NOTE — H&P (Signed)
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 Referred by Dr. Collene Mares for diffuse abdominal and right upper quadrant pain, nausea and diarrhea (up to 10 bowel movements a day) that began in September 2020 with some relief provided by Imodium and Pepto-Bismol, IB guard. She describes the first episode after eating barbecue she noted some abdominal discomfort and decreased appetite, she went home and try to have some soup but developed nausea and diarrhea after this. The next time she had an attack was after eating some fried oysters and she again had abdominal cramping and diarrhea. She states the pain is diffuse but it does seem to settle in the right upper quadrant where it is not a pain but more an awareness of discomfort. She has also had symptoms secondary to eating dairy products such as ice cream. When she saw Dr. Collene Mares she continued to have some nausea and abdominal pain but the diarrhea had resolved. She does have reflux that is well-controlled with excellent. Her last upper endoscopy was about 10 years ago. She follows with the [facility].  Medical problems include severe obesity with a BMI of 44, acid reflux, hypertension, migraines, hepatitis steatosis, diverticulosis, diabetes, hyperlipidemia, sleep apnea, vitamin D deficiency.  US/ HIDA Dec 2020: No gallstones or wall thickening, normal common bile duct, 2 small cysts in the right liver adjacent to the gallbladder significance, hepatic steatosis. HIDA shows subjectively low normal emptying of tracer from the gallbladder following fatty meal stimulation, the cochlear gallbladder ejection fraction of 39% (normal considered to be greater than 33%.) No symptoms following Ensure ingestion. Colonoscopy October 2020 with diverticulosis and a  hyperplastic polyp.   Past Surgical History Colon Polyp Removal - Colonoscopy  Tonsillectomy   Diagnostic Studies History  Colonoscopy  within last year Mammogram  within last year Pap Smear  1-5 years ago  Allergies Codeine/Codeine Derivatives  Penicillins   Medication History ALPRAZolam (0.5MG  Tablet, Oral) Active. busPIRone HCl (5MG  Tablet, Oral) Active. Dexilant (60MG  Capsule DR, Oral) Active. Vitamin D (Ergocalciferol) (1.25 MG(50000 UT) Capsule, Oral) Active. Verapamil HCl ER (300MG  Capsule ER 24HR, Oral) Active.   Social History  Alcohol use  Occasional alcohol use. Caffeine use  Carbonated beverages, Coffee, Tea. No drug use  Tobacco use  Never smoker.  Family History  Depression  Father. Diabetes Mellitus  Mother. Heart Disease  Brother, Father, Mother. Heart disease in female family member before age 105  Hypertension  Brother, Father, Mother, Sister. Kidney Disease  Mother. Respiratory Condition  Father, Mother.  Pregnancy / Birth History  Age at menarche  10 years. Age of menopause  53-55 Gravida  1 Maternal age  3-25 Para  0  Other Problems  Anxiety Disorder  Diabetes Mellitus  Gastroesophageal Reflux Disease  Hemorrhoids  High blood pressure  Hypercholesterolemia  Migraine Headache  Sleep Apnea     Review of Systems  General Not Present- Appetite Loss, Chills, Fatigue, Fever, Night Sweats, Weight Gain and Weight Loss. Skin Not Present- Change in Wart/Mole, Dryness, Hives, Jaundice, New Lesions, Non-Healing Wounds, Rash and Ulcer. HEENT Present- Seasonal Allergies and Wears glasses/contact lenses. Not Present- Earache, Hearing Loss, Hoarseness, Nose Bleed, Oral Ulcers, Ringing in the Ears, Sinus Pain, Sore Throat, Visual Disturbances and Yellow Eyes. Respiratory Present- Snoring. Not Present- Bloody sputum, Chronic Cough, Difficulty Breathing and Wheezing. Breast  Not Present- Breast Mass, Breast  Pain, Nipple Discharge and Skin Changes. Cardiovascular Not Present- Chest Pain, Difficulty Breathing Lying Down, Leg Cramps, Palpitations, Rapid Heart Rate, Shortness of Breath and Swelling of Extremities. Gastrointestinal Present- Abdominal Pain, Hemorrhoids and Indigestion. Not Present- Bloating, Bloody Stool, Change in Bowel Habits, Chronic diarrhea, Constipation, Difficulty Swallowing, Excessive gas, Gets full quickly at meals, Nausea, Rectal Pain and Vomiting. Female Genitourinary Present- Pelvic Pain. Not Present- Frequency, Nocturia, Painful Urination and Urgency. Musculoskeletal Present- Back Pain and Joint Stiffness. Not Present- Joint Pain, Muscle Pain, Muscle Weakness and Swelling of Extremities. Neurological Present- Headaches. Not Present- Decreased Memory, Fainting, Numbness, Seizures, Tingling, Tremor, Trouble walking and Weakness. Psychiatric Present- Depression. Not Present- Anxiety, Bipolar, Change in Sleep Pattern, Fearful and Frequent crying. Endocrine Present- New Diabetes. Not Present- Cold Intolerance, Excessive Hunger, Hair Changes, Heat Intolerance and Hot flashes. Hematology Not Present- Blood Thinners, Easy Bruising, Excessive bleeding, Gland problems, HIV and Persistent Infections. All other systems negative  Vitals  Weight: 239.8 lb Height: 62in Body Surface Area: 2.06 m Body Mass Index: 43.86 kg/m  Temp.: 4F (Tympanic)  Pulse: 98 (Regular)  BP: 134/72(Sitting, Left Arm, Standard)  Physical Exam  She is alert and well-appearing Extraocular motions intact, no scleral icterus Unlabored respirations Regular rate and rhythm, nonpitting bilateral lower extremity edema (mild) Abdomen is obese, soft, nondistended, no tenderness except for mild peri-subjective point tenderness in the right upper quadrant  Assessment & Plan ABDOMINAL PAIN (R10.9) Story: While the postprandial anorexia and nausea and occasional right upper quadrant pain may be  secondary to biliary etiology, her ultrasound and HIDA were both technically normal. The diffuse abdominal cramping and diarrhea I do not think are secondary to biliary etiology. We discussed the anatomy of the biliary tract using a diagram to demonstrate and the technique of laparoscopic cholecystectomy. Discussed risks of surgery including bleeding, infection, pain, scarring, injury to intra-abdominal structures including the common bile duct and sequelae, bile leak, conversion to open surgery, cardiac/pulmonary/thromboembolic complications, all of which being very low risk. The biggest issue for her would be a question of whether this would alleviate her symptoms and I would not be able to guarantee that. If surgery were pursued and symptoms fail to resolve, would consider upper endoscopy for further evaluation of other GI etiologies with Dr. Collene Mares. Questions were answered. At this time she would like to hold off on surgery. She will call us with any issues.

## 2019-09-14 NOTE — Op Note (Signed)
Operative Note  Rhonda Aguilar 64 y.o. female [id number]  09/14/2019  Surgeon: Clovis Riley MD FACS  Procedure performed: Laparoscopic Cholecystectomy  Preop diagnosis: biliary dyskinesia Post-op diagnosis/intraop findings: same, chronic cholecystitis  Specimens: gallbladder  Retained items: none  EBL: minimal  Complications: none  Description of procedure: After obtaining informed consent the patient was brought to the operating room. Antibiotics were administered. SCD's were applied. General endotracheal anesthesia was initiated and a formal time-out was performed. The abdomen was prepped and draped in the usual sterile fashion and the abdomen was entered using an optiview in the right upper quadrant after instilling the site with local. Insufflation to 53mmHg was obtained without incident and gross inspection revealed no evidence of injury from our entry or other intraabdominal abnormalities. There are omental adhesions in the right lower abdomen. Two 35mm trocars were introduced in the supraumbilical and right anterior axillary lines under direct visualization and following infiltration with local. An 49mm trocar was placed in the epigastrium. The gallbladder was retracted cephalad and the infundibulum was retracted laterally. Omental adhesions to the gallbladder were divided bluntly and with cautery, carefully protecting the duodenum and colon. A combination of hook electrocautery and blunt dissection was utilized to clear the peritoneum from the neck and cystic duct, circumferentially isolating the cystic artery and cystic duct and lifting the gallbladder from the cystic plate. The critical view of safety was achieved with the cystic artery, cystic duct, and liver bed visualized between them with no other structures. The artery was clipped with a single clip proximally and distally and divided as was the cystic duct with three clips on the proximal end. The gallbladder was dissected from  the liver plate using electrocautery. Once freed the gallbladder was placed in an endocatch bag and removed intact through the epigastric trocar site. A small amount of bleeding on the liver bed was controlled with cautery.The right upper quadrant was irrigated and aspirated, the effluent was clear. Hemostasis was once again confirmed, and reinspection of the abdomen revealed no injuries. The clips were well opposed without any bile leak from the duct or the liver bed. I placed a PDS endoloop just proximal to the clips on the cystic duct. The 14mm trocar site in the epigastrium was closed with a 0 vicryl in the fascia under direct visualization using a PMI device. The abdomen was desufflated and all trocars removed. The skin incisions were closed with subcuticular monocryl and Dermabond. The patient was awakened, extubated and transported to the recovery room in stable condition.    All counts were correct at the completion of the case.

## 2019-09-14 NOTE — Discharge Instructions (Signed)
LAPAROSCOPIC SURGERY: POST OP INSTRUCTIONS  EAT Gradually transition to a high fiber diet with a fiber supplement over the next few weeks after discharge.  Start with a pureed / full liquid diet (see below)  WALK Walk an hour a day (cumulative).  Control your pain to do that.    CONTROL PAIN Control pain so that you can walk, sleep, tolerate sneezing/coughing, go up/down stairs.  HAVE A BOWEL MOVEMENT DAILY Keep your bowels regular to avoid problems.  OK to try a laxative to override constipation.  OK to use an antidairrheal to slow down diarrhea.  Call if not better after 2 tries  CALL IF YOU HAVE PROBLEMS/CONCERNS Call if you are still struggling despite following these instructions. Call if you have concerns not answered by these instructions  ######################################################################    1. DIET: Follow a light bland diet & liquids the first 24 hours after arrival home, such as soup, liquids, starches, etc.  Be sure to drink plenty of fluids.  Quickly advance to a usual solid diet within a few days.  Avoid fast food or heavy meals as your are more likely to get nauseated or have irregular bowels.  A low-sugar, high-fiber diet for the rest of your life is ideal.  2. Take your usually prescribed home medications unless otherwise directed.  3. PAIN CONTROL: a. Pain is best controlled by a usual combination of three different methods TOGETHER: i. Ice/Heat ii. Over the counter pain medication iii. Prescription pain medication b. Most patients will experience some swelling and bruising around the incisions.  Ice packs or heating pads (30-60 minutes up to 6 times a day) will help. Use ice for the first few days to help decrease swelling and bruising, then switch to heat to help relax tight/sore spots and speed recovery.  Some people prefer to use ice alone, heat alone, alternating between ice & heat.  Experiment to what works for you.  Swelling and bruising can  take several weeks to resolve.   c. It is helpful to take an over-the-counter pain medication regularly for the first few days: i. Naproxen (Aleve, etc)  Two 220mg  tabs twice a day OR Ibuprofen (Advil, etc) Three 200mg  tabs four times a day (every meal & bedtime) AND ii. Acetaminophen (Tylenol, etc) 500-650mg  four times a day (every meal & bedtime) d. A  prescription for pain medication (such as oxycodone, hydrocodone, tramadol, gabapentin, methocarbamol, etc) should be given to you upon discharge.  Take your pain medication as prescribed.  i. If you are having problems/concerns with the prescription medicine (does not control pain, nausea, vomiting, rash, itching, etc), please call us [phone] to see if we need to switch you to a different pain medicine that will work better for you and/or control your side effect better. ii. If you need a refill on your pain medication, please give [name] 48 hour notice.  contact your pharmacy.  They will contact our office to request authorization. Prescriptions will not be filled after 5 pm or on week-ends  4. Avoid getting constipated.   a. Between the surgery and the pain medications, it is common to experience some constipation.   b. Increasing fluid intake and taking a fiber supplement (such as Metamucil, Citrucel, FiberCon, MiraLax, etc) 1-2 times a day regularly will usually help prevent this problem from occurring.   c. A mild laxative (prune juice, Milk of Magnesia, MiraLax, etc) should be taken according to package directions if there are no bowel movements after 48 hours.  5. Watch out for diarrhea.   a. If you have many loose bowel movements, simplify your diet to bland foods & liquids for a few days.   b. Stop any stool softeners and decrease your fiber supplement.   c. Switching to mild anti-diarrheal medications (Kayopectate, Pepto Bismol) can help.   d. If this worsens or does not improve, please call us.  6. Wash / shower every day.  You may  shower over the skin glue which is waterproof.  Continue to shower over incision(s) after the dressing is off.  7. Glue will flake off after 2 weeks.  You may leave the incision open to air.  You may replace a dressing/Band-Aid to cover the incision for comfort if you wish.   8. ACTIVITIES as tolerated:   a. You may resume regular (light) daily activities beginning the next day--such as daily self-care, walking, climbing stairs--gradually increasing activities as tolerated.  If you can walk 30 minutes without difficulty, it is safe to try more intense activity such as jogging, treadmill, bicycling, low-impact aerobics, swimming, etc. b. Save the most intensive and strenuous activity for last such as sit-ups, heavy lifting, contact sports, etc  Refrain from any heavy lifting or straining until you are off narcotics for pain control.   c. DO NOT PUSH THROUGH PAIN.  Let pain be your guide: If it hurts to do something, don't do it.  Pain is your body warning you to avoid that activity for another week until the pain goes down. d. You may drive when you are no longer taking prescription pain medication, you can comfortably wear a seatbelt, and you can safely maneuver your car and apply brakes. e. Dennis Bast may have sexual intercourse when it is comfortable.  9. FOLLOW UP in our office a. Please call [facility] at [phone] to set up an appointment to see your surgeon in the office for a follow-up appointment approximately 2-3 weeks after your surgery. b. Make sure that you call for this appointment the day you arrive home to insure a convenient appointment time.  10. IF YOU HAVE DISABILITY OR FAMILY LEAVE FORMS, BRING THEM TO THE OFFICE FOR PROCESSING.  DO NOT GIVE THEM TO YOUR DOCTOR.   WHEN TO CALL us [phone]: 1. Poor pain control 2. Reactions / problems with new medications (rash/itching, nausea, etc)  3. Fever over 101.5 F (38.5 C) 4. Inability to urinate 5. Nausea and/or vomiting 6. Worsening  swelling or bruising 7. Continued bleeding from incision. 8. Increased pain, redness, or drainage from the incision   The clinic staff is available to answer your questions during regular business hours (8:30am-5pm).  Please don't hesitate to call and ask to speak to one of our nurses for clinical concerns.   If you have a medical emergency, go to the nearest emergency room or call 911.  A surgeon from [facility] is always on call at the [facility], [place], [place], [place], [place]  [address] ? MAIN: [phone] ? TOLL FREE: [phone] ?  FAX [phone] [email or web address]

## 2019-09-14 NOTE — Anesthesia Procedure Notes (Signed)
Procedure Name: Intubation Date/Time: 09/14/2019 8:52 AM Performed by: West Pugh, CRNA Pre-anesthesia Checklist: Patient identified, Emergency Drugs available, Suction available, Patient being monitored and Timeout performed Patient Re-evaluated:Patient Re-evaluated prior to induction Oxygen Delivery Method: Circle system utilized Preoxygenation: Pre-oxygenation with 100% oxygen Induction Type: IV induction Ventilation: Mask ventilation without difficulty Laryngoscope Size: Mac and 3 Grade View: Grade I Tube type: Oral Tube size: 7.0 mm Number of attempts: 1 Airway Equipment and Method: Stylet Placement Confirmation: ETT inserted through vocal cords under direct vision,  positive ETCO2 and breath sounds checked- equal and bilateral Secured at: 20 cm Tube secured with: Tape Dental Injury: Teeth and Oropharynx as per pre-operative assessment

## 2019-09-14 NOTE — Anesthesia Postprocedure Evaluation (Signed)
Anesthesia Post Note  Patient: Rhonda Aguilar  Procedure(s) Performed: LAPAROSCOPIC CHOLECYSTECTOMY (N/A )     Patient location during evaluation: PACU Anesthesia Type: General Level of consciousness: awake and alert Pain management: pain level controlled Vital Signs Assessment: post-procedure vital signs reviewed and stable Respiratory status: spontaneous breathing, nonlabored ventilation, respiratory function stable and patient connected to nasal cannula oxygen Cardiovascular status: blood pressure returned to baseline and stable Postop Assessment: no apparent nausea or vomiting Anesthetic complications: no   No complications documented.  Last Vitals:  Vitals:   09/14/19 1118 09/14/19 1130  BP:  (!) 132/76  Pulse: 70 66  Resp: 18   Temp: (!) 36.3 C   SpO2: 92% 97%    Last Pain:  Vitals:   09/14/19 1130  TempSrc:   PainSc: 0-No pain                 Barnell Shieh S

## 2019-09-15 ENCOUNTER — Encounter (HOSPITAL_COMMUNITY): Payer: Self-pay | Admitting: Surgery

## 2019-09-17 LAB — SURGICAL PATHOLOGY

## 2019-09-24 MED FILL — DEXILANT DR 60 MG CAPSULE: 60 | 30 days supply | Qty: 30 | Fill #3

## 2019-09-27 ENCOUNTER — Ambulatory Visit (INDEPENDENT_AMBULATORY_CARE_PROVIDER_SITE_OTHER): Payer: 59 | Admitting: Physician Assistant

## 2019-09-27 ENCOUNTER — Encounter (INDEPENDENT_AMBULATORY_CARE_PROVIDER_SITE_OTHER): Payer: Self-pay | Admitting: Physician Assistant

## 2019-09-27 ENCOUNTER — Other Ambulatory Visit: Payer: Self-pay

## 2019-09-27 VITALS — BP 116/77 | HR 84 | Temp 97.8°F | Ht 62.0 in | Wt 236.0 lb

## 2019-09-27 DIAGNOSIS — E119 Type 2 diabetes mellitus without complications: Secondary | ICD-10-CM | POA: Diagnosis not present

## 2019-09-27 DIAGNOSIS — Z6841 Body Mass Index (BMI) 40.0 and over, adult: Secondary | ICD-10-CM | POA: Diagnosis not present

## 2019-09-27 DIAGNOSIS — E559 Vitamin D deficiency, unspecified: Secondary | ICD-10-CM | POA: Diagnosis not present

## 2019-09-27 DIAGNOSIS — E66813 Obesity, class 3: Secondary | ICD-10-CM

## 2019-09-27 NOTE — Progress Notes (Signed)
Chief Complaint:   OBESITY Rhonda Aguilar is here to discuss her progress with her obesity treatment plan along with follow-up of her obesity related diagnoses. Rhonda Aguilar is on the Category 3 Plan and states she is following her eating plan approximately 0% of the time. Rhonda Aguilar states she is walking 10-20 minutes 3 times per week.  Today's visit was #: 103 Starting weight: 231 lbs Starting date: 12/08/2017 Today's weight: 236 lbs Today's date: 09/27/2019 Total lbs lost to date: 0 Total lbs lost since last in-office visit: 0  Interim History: Rhonda Aguilar is recovering from a cholecystectomy 2 weeks ago. She reports that she is not supposed to be eating fatty foods, but continues to do so intermittently. She will have related diarrhea episodes at times. She is ready to get back on a category plan.  Subjective:   Type 2 diabetes mellitus without complication, without long-term current use of insulin (Rhonda Aguilar). Rhonda Aguilar is on Jardiance. No hypoglycemia.  Lab Results  Component Value Date   HGBA1C 7.0 (H) 08/22/2019   HGBA1C 6.7 (H) 04/19/2019   Lab Results  Component Value Date   LDLCALC 146 (H) 08/22/2019   CREATININE 0.72 09/05/2019   Lab Results  Component Value Date   INSULIN 33.6 (H) 08/22/2019   INSULIN 40.0 (H) 04/19/2019   INSULIN 40.9 (H) 04/12/2018   INSULIN 38.2 (H) 12/08/2017   Vitamin D deficiency. Rhonda Aguilar is on Vitamin D twice weekly. No nausea, vomiting, or muscle weakness.    Ref. Range 08/22/2019 14:18  Vitamin D, 25-Hydroxy Latest Ref Range: 30.0 - 100.0 ng/mL 32.1   Assessment/Plan:   Type 2 diabetes mellitus without complication, without long-term current use of insulin (Rhonda Aguilar). Good blood sugar control is important to decrease the likelihood of diabetic complications such as nephropathy, neuropathy, limb loss, blindness, coronary artery disease, and death. Intensive lifestyle modification including diet, exercise and weight loss are the first line of treatment for diabetes.  Rhonda Aguilar will continue her medication as directed.   Vitamin D deficiency. Low Vitamin D level contributes to fatigue and are associated with obesity, breast, and colon cancer. She agrees to continue to take Vitamin D as directed and will follow-up for routine testing of Vitamin D, at least 2-3 times per year to avoid over-replacement.  Class 3 severe obesity with serious comorbidity and body mass index (BMI) of 40.0 to 44.9 in adult, unspecified obesity type (Rhonda Aguilar).  Rhonda Aguilar is currently in the action stage of change. As such, her goal is to continue with weight loss efforts. She has agreed to the Category 2 Plan (modified - 2 oz extra lean protein at breakfast and lunch).   Exercise goals: Older adults should follow the adult guidelines. When older adults cannot meet the adult guidelines, they should be as physically active as their abilities and conditions will allow.   Behavioral modification strategies: increasing lean protein intake and no skipping meals.  Rhonda Aguilar has agreed to follow-up with our clinic in 2-3 weeks. She was informed of the importance of frequent follow-up visits to maximize her success with intensive lifestyle modifications for her multiple health conditions.   Objective:   Blood pressure 116/77, pulse 84, temperature 97.8 F (36.6 C), temperature source Oral, height 5\' 2"  (1.575 m), weight 236 lb (107 kg), SpO2 96 %. Body mass index is 43.16 kg/m.  General: Cooperative, alert, well developed, in no acute distress. HEENT: Conjunctivae and lids unremarkable. Cardiovascular: Regular rhythm.  Lungs: Normal work of breathing. Neurologic: No focal deficits.  Lab Results  Component Value Date   CREATININE 0.72 09/05/2019   BUN 21 09/05/2019   NA 140 09/05/2019   K 4.1 09/05/2019   CL 106 09/05/2019   CO2 20 (L) 09/05/2019   Lab Results  Component Value Date   ALT 70 (H) 08/22/2019   AST 49 (H) 08/22/2019   ALKPHOS 89 08/22/2019   BILITOT 0.4 08/22/2019   Lab  Results  Component Value Date   HGBA1C 7.0 (H) 08/22/2019   HGBA1C 6.7 (H) 04/19/2019   Lab Results  Component Value Date   INSULIN 33.6 (H) 08/22/2019   INSULIN 40.0 (H) 04/19/2019   INSULIN 40.9 (H) 04/12/2018   INSULIN 38.2 (H) 12/08/2017   Lab Results  Component Value Date   TSH 2.550 12/08/2017   Lab Results  Component Value Date   CHOL 239 (H) 08/22/2019   HDL 40 08/22/2019   LDLCALC 146 (H) 08/22/2019   TRIG 288 (H) 08/22/2019   Lab Results  Component Value Date   WBC 7.6 09/05/2019   HGB 15.0 09/05/2019   HCT 47.0 (H) 09/05/2019   MCV 85.0 09/05/2019   PLT 228 09/05/2019   No results found for: IRON, TIBC, FERRITIN  Attestation Statements:   Reviewed by clinician on day of visit: allergies, medications, problem list, medical history, surgical history, family history, social history, and previous encounter notes.  Time spent on visit including pre-visit chart review and post-visit charting and care was 33 minutes.   IMichaelene Song, am acting as transcriptionist for [name], PA-C   I have reviewed the above documentation for accuracy and completeness, and I agree with the above. [name], PA-C

## 2019-10-04 DIAGNOSIS — E782 Mixed hyperlipidemia: Secondary | ICD-10-CM | POA: Diagnosis not present

## 2019-10-04 DIAGNOSIS — I10 Essential (primary) hypertension: Secondary | ICD-10-CM | POA: Diagnosis not present

## 2019-10-04 MED FILL — CHLORTHALIDONE 25 MG TABS: 25 | 90 days supply | Qty: 90 | Fill #0

## 2019-10-04 MED FILL — ROSUVASTATIN CALCIUM 5 MG T: 5 | 84 days supply | Qty: 36 | Fill #0

## 2019-10-17 ENCOUNTER — Encounter (INDEPENDENT_AMBULATORY_CARE_PROVIDER_SITE_OTHER): Payer: Self-pay | Admitting: Physician Assistant

## 2019-10-17 ENCOUNTER — Other Ambulatory Visit: Payer: Self-pay

## 2019-10-17 ENCOUNTER — Ambulatory Visit (INDEPENDENT_AMBULATORY_CARE_PROVIDER_SITE_OTHER): Payer: 59 | Admitting: Physician Assistant

## 2019-10-17 VITALS — BP 134/78 | HR 74 | Temp 98.0°F | Ht 62.0 in | Wt 234.0 lb

## 2019-10-17 DIAGNOSIS — E7849 Other hyperlipidemia: Secondary | ICD-10-CM

## 2019-10-17 DIAGNOSIS — Z6841 Body Mass Index (BMI) 40.0 and over, adult: Secondary | ICD-10-CM

## 2019-10-17 DIAGNOSIS — E559 Vitamin D deficiency, unspecified: Secondary | ICD-10-CM

## 2019-10-17 DIAGNOSIS — Z9189 Other specified personal risk factors, not elsewhere classified: Secondary | ICD-10-CM

## 2019-10-17 MED ORDER — VITAMIN D (ERGOCALCIFEROL) 1.25 MG (50000 UNIT) PO CAPS: ORAL_CAPSULE | ORAL | 0 refills | Status: DC

## 2019-10-17 MED FILL — VIT D2 1.25 MG (50,000 UNIT: 1.25 MG | 35 days supply | Qty: 10 | Fill #0

## 2019-10-17 NOTE — Progress Notes (Signed)
Chief Complaint:   OBESITY Rhonda Aguilar is here to discuss her progress with her obesity treatment plan along with follow-up of her obesity related diagnoses. Rhonda Aguilar is on the Category 3 Plan + provisions and states she is following her eating plan approximately 50% of the time. Rhonda Aguilar states she is increasing her regular activity.   Today's visit was #: 38 Starting weight: 231 lbs Starting date: 12/08/2017 Today's weight: 234 lbs Today's date: 10/17/2019 Total lbs lost to date: 0 Total lbs lost since last in-office visit: 2  Interim History: Rhonda Aguilar continues to recover from her cholecystectomy and states she is able to eat more protein. She continues to crave sweets.  Subjective:   Vitamin D deficiency. Rhonda Aguilar is on prescription Vitamin D. No nausea, vomiting, or muscle weakness.    Ref. Range 08/22/2019 14:18  Vitamin D, 25-Hydroxy Latest Ref Range: 30.0 - 100.0 ng/mL 32.1   Other hyperlipidemia. Rhonda Aguilar is on Crestor 3 times weekly. No chest pain or myalgias. She will be seeing Cardiology for follow-up soon.   Lab Results  Component Value Date   CHOL 239 (H) 08/22/2019   HDL 40 08/22/2019   LDLCALC 146 (H) 08/22/2019   TRIG 288 (H) 08/22/2019   Lab Results  Component Value Date   ALT 70 (H) 08/22/2019   AST 49 (H) 08/22/2019   ALKPHOS 89 08/22/2019   BILITOT 0.4 08/22/2019   The 10-year ASCVD risk score [name] DC Jr., et al., 2013) is: 18.8%   Values used to calculate the score:     Age: 64 years     Sex: Female     Is Non-Hispanic African American: No     Diabetic: Yes     Tobacco smoker: No     Systolic Blood Pressure: 284 mmHg     Is BP treated: Yes     HDL Cholesterol: 40 mg/dL     Total Cholesterol: 239 mg/dL  At risk for osteoporosis. Rhonda Aguilar is at higher risk of osteopenia and osteoporosis due to Vitamin D deficiency.   Assessment/Plan:   Vitamin D deficiency. Low Vitamin D level contributes to fatigue and are associated with obesity, breast, and colon  cancer. She was given a refill on her Vitamin D, Ergocalciferol, (DRISDOL) 1.25 MG (50000 UNIT) CAPS capsule twice weekly #10 with 0 refills and will follow-up for routine testing of Vitamin D, at least 2-3 times per year to avoid over-replacement.   Other hyperlipidemia. Cardiovascular risk and specific lipid/LDL goals reviewed.  We discussed several lifestyle modifications today and Rhonda Aguilar will continue to work on diet, exercise and weight loss efforts. Orders and follow up as documented in patient record. She will follow-up with Cardiology as scheduled.  Counseling Intensive lifestyle modifications are the first line treatment for this issue. . Dietary changes: Increase soluble fiber. Decrease simple carbohydrates. . Exercise changes: Moderate to vigorous-intensity aerobic activity 150 minutes per week if tolerated. . Lipid-lowering medications: see documented in medical record.  At risk for osteoporosis. Rhonda Aguilar was given approximately 15 minutes of osteoporosis prevention counseling today. Rhonda Aguilar is at risk for osteopenia and osteoporosis due to her Vitamin D deficiency. She was encouraged to take her Vitamin D and follow her higher calcium diet and increase strengthening exercise to help strengthen her bones and decrease her risk of osteopenia and osteoporosis.  Repetitive spaced learning was employed today to elicit superior memory formation and behavioral change.  Class 3 severe obesity with serious comorbidity and body mass index (BMI) of 40.0  to 44.9 in adult, unspecified obesity type (Rhonda Aguilar).  Rhonda Aguilar is currently in the action stage of change. As such, her goal is to continue with weight loss efforts. She has agreed to the Category 2 Plan + 2 oz lean protein at breakfast and lunch.   Exercise goals: Older adults should follow the adult guidelines. When older adults cannot meet the adult guidelines, they should be as physically active as their abilities and conditions will allow.   Behavioral  modification strategies: increasing lean protein intake and no skipping meals.  Rhonda Aguilar has agreed to follow-up with our clinic in 2-3 weeks. She was informed of the importance of frequent follow-up visits to maximize her success with intensive lifestyle modifications for her multiple health conditions.   Objective:   Blood pressure 134/78, pulse 74, temperature 98 F (36.7 C), temperature source Oral, height 5\' 2"  (1.575 m), weight 234 lb (106.1 kg), SpO2 95 %. Body mass index is 42.8 kg/m.  General: Cooperative, alert, well developed, in no acute distress. HEENT: Conjunctivae and lids unremarkable. Cardiovascular: Regular rhythm.  Lungs: Normal work of breathing. Neurologic: No focal deficits.   Lab Results  Component Value Date   CREATININE 0.72 09/05/2019   BUN 21 09/05/2019   NA 140 09/05/2019   K 4.1 09/05/2019   CL 106 09/05/2019   CO2 20 (L) 09/05/2019   Lab Results  Component Value Date   ALT 70 (H) 08/22/2019   AST 49 (H) 08/22/2019   ALKPHOS 89 08/22/2019   BILITOT 0.4 08/22/2019   Lab Results  Component Value Date   HGBA1C 7.0 (H) 08/22/2019   HGBA1C 6.7 (H) 04/19/2019   Lab Results  Component Value Date   INSULIN 33.6 (H) 08/22/2019   INSULIN 40.0 (H) 04/19/2019   INSULIN 40.9 (H) 04/12/2018   INSULIN 38.2 (H) 12/08/2017   Lab Results  Component Value Date   TSH 2.550 12/08/2017   Lab Results  Component Value Date   CHOL 239 (H) 08/22/2019   HDL 40 08/22/2019   LDLCALC 146 (H) 08/22/2019   TRIG 288 (H) 08/22/2019   Lab Results  Component Value Date   WBC 7.6 09/05/2019   HGB 15.0 09/05/2019   HCT 47.0 (H) 09/05/2019   MCV 85.0 09/05/2019   PLT 228 09/05/2019   No results found for: IRON, TIBC, FERRITIN  Attestation Statements:   Reviewed by clinician on day of visit: allergies, medications, problem list, medical history, surgical history, family history, social history, and previous encounter notes.  IMichaelene Song, am acting as  transcriptionist for [name], PA-C   I have reviewed the above documentation for accuracy and completeness, and I agree with the above. [name], PA-C

## 2019-10-18 ENCOUNTER — Telehealth: Payer: Self-pay | Admitting: *Deleted

## 2019-10-18 NOTE — Telephone Encounter (Signed)
  Patient Consent for Virtual Visit         Rhonda Aguilar has provided verbal consent on 10/18/2019 for a virtual visit (video or telephone).   CONSENT FOR VIRTUAL VISIT FOR:  Rhonda Aguilar  By participating in this virtual visit I agree to the following:  I hereby voluntarily request, consent and authorize [facility] and its employed or contracted physicians, physician assistants, nurse practitioners or other licensed health care professionals (the Practitioner), to provide me with telemedicine health care services (the "Services") as deemed necessary by the treating Practitioner. I acknowledge and consent to receive the Services by the Practitioner via telemedicine. I understand that the telemedicine visit will involve communicating with the Practitioner through live audiovisual communication technology and the disclosure of certain medical information by electronic transmission. I acknowledge that I have been given the opportunity to request an in-person assessment or other available alternative prior to the telemedicine visit and am voluntarily participating in the telemedicine visit.  I understand that I have the right to withhold or withdraw my consent to the use of telemedicine in the course of my care at any time, without affecting my right to future care or treatment, and that the Practitioner or I may terminate the telemedicine visit at any time. I understand that I have the right to inspect all information obtained and/or recorded in the course of the telemedicine visit and may receive copies of available information for a reasonable fee.  I understand that some of the potential risks of receiving the Services via telemedicine include:  [name] Delay or interruption in medical evaluation due to technological equipment failure or disruption; . Information transmitted may not be sufficient (e.g. poor resolution of images) to allow for appropriate medical decision making by the Practitioner;  and/or  . In rare instances, security protocols could fail, causing a breach of personal health information.  Furthermore, I acknowledge that it is my responsibility to provide information about my medical history, conditions and care that is complete and accurate to the best of my ability. I acknowledge that Practitioner's advice, recommendations, and/or decision may be based on factors not within their control, such as incomplete or inaccurate data provided by me or distortions of diagnostic images or specimens that may result from electronic transmissions. I understand that the practice of medicine is not an exact science and that Practitioner makes no warranties or guarantees regarding treatment outcomes. I acknowledge that a copy of this consent can be made available to me via my patient portal ([facility]), or I can request a printed copy by calling the office of [facility].    I understand that my insurance will be billed for this visit.   I have read or had this consent read to me. . I understand the contents of this consent, which adequately explains the benefits and risks of the Services being provided via telemedicine.  . I have been provided ample opportunity to ask questions regarding this consent and the Services and have had my questions answered to my satisfaction. . I give my informed consent for the services to be provided through the use of telemedicine in my medical care

## 2019-10-23 MED FILL — SHINGRIX 50 MCG SUS: 50 | 1 days supply | Qty: 1 | Fill #0

## 2019-10-26 MED FILL — FLUCONAZOLE 150 MG TABS: 150 | 5 days supply | Qty: 2 | Fill #0

## 2019-10-26 MED FILL — DEXILANT DR 60 MG CAPSULE: 60 | 30 days supply | Qty: 30 | Fill #4

## 2019-10-30 ENCOUNTER — Encounter: Payer: Self-pay | Admitting: Cardiology

## 2019-10-30 ENCOUNTER — Other Ambulatory Visit: Payer: Self-pay

## 2019-10-30 ENCOUNTER — Telehealth (INDEPENDENT_AMBULATORY_CARE_PROVIDER_SITE_OTHER): Payer: 59 | Admitting: Cardiology

## 2019-10-30 VITALS — BP 135/85 | HR 78 | Ht 62.0 in

## 2019-10-30 DIAGNOSIS — R0609 Other forms of dyspnea: Secondary | ICD-10-CM

## 2019-10-30 DIAGNOSIS — Z8249 Family history of ischemic heart disease and other diseases of the circulatory system: Secondary | ICD-10-CM | POA: Diagnosis not present

## 2019-10-30 DIAGNOSIS — Q211 Atrial septal defect: Secondary | ICD-10-CM

## 2019-10-30 DIAGNOSIS — R0600 Dyspnea, unspecified: Secondary | ICD-10-CM

## 2019-10-30 DIAGNOSIS — I10 Essential (primary) hypertension: Secondary | ICD-10-CM

## 2019-10-30 DIAGNOSIS — G4733 Obstructive sleep apnea (adult) (pediatric): Secondary | ICD-10-CM | POA: Diagnosis not present

## 2019-10-30 DIAGNOSIS — Q2112 Patent foramen ovale: Secondary | ICD-10-CM

## 2019-10-30 MED ORDER — METOPROLOL TARTRATE 100 MG PO TABS: 100.0000 mg | ORAL_TABLET | Freq: Once | ORAL | 0 refills | Status: DC

## 2019-10-30 MED FILL — METOPROLOL TARTRATE 100 MG: 100 | 1 days supply | Qty: 1 | Fill #0

## 2019-10-30 NOTE — Progress Notes (Signed)
Virtual Visit via Telephone Note   This visit type was conducted due to national recommendations for restrictions regarding the COVID-19 Pandemic (e.g. social distancing) in an effort to limit this patient's exposure and mitigate transmission in our community.  Due to her co-morbid illnesses, this patient is at least at moderate risk for complications without adequate follow up.  This format is felt to be most appropriate for this patient at this time.  The patient did not have access to video technology/had technical difficulties with video requiring transitioning to audio format only (telephone).  All issues noted in this document were discussed and addressed.  No physical exam could be performed with this format.  Please refer to the patient's chart for her  consent to telehealth for [facility]   Evaluation Performed:  Cardiology Consult  This visit type was conducted due to national recommendations for restrictions regarding the COVID-19 Pandemic (e.g. social distancing).  This format is felt to be most appropriate for this patient at this time.  All issues noted in this document were discussed and addressed.  No physical exam was performed (except for noted visual exam findings with Video Visits).  Please refer to the patient's chart (MyChart message for video visits and phone note for telephone visits) for the patient's consent to telehealth for [facility].  Date:  10/30/2019   ID:  Rhonda Aguilar, DOB [birth year], MRN [id number]  Patient Location:  [place]  Provider location:   [place]  PCP:  Glenis Smoker, MD  Cardiologist:  Fransico Him, MD Electrophysiologist:  None   Chief Complaint:  DOE  History of Present Illness:    Rhonda Aguilar is a 64 y.o. female who presents via audio/video conferencing for a telehealth visit today.    This is a 64yo female with a hx of HTN, DM2, Obesity and OSA.  She tells me that she has been having a lot of DOE and lack of energy.   She says that doing housework, even washing dishes, makes her SOB. She says that she took her great nephew to the mountains and noticed that she was more out of breath than the other adults. She is able to walk up her 11 steps at home without problems.  She says that she feels tight in her upper arms sometimes when she is walking.  Her mother was a diabetic and had diabetic CAD on cath.  Her father had a CABG but had a dissection during surgery and died of sepsis shortly after.  Her brother had a spontaneous aortic dissection.    She recently underwent lab chole and had some issues with HTN around that time but it has normalized.  Then she was noted to have elevated BP again at her PCP and it was elevated and they recommended that she go back on HCTZ 25mg  but it wiped her out and she stopped it.  She is seeing the [facility] [place].  She had a cardiopulmonary stress test done in 2106 that was consistent with deconditioning and obesity.  She also has a low Vitamin D level and is on suppl.   The patient does not have symptoms concerning for COVID-19 infection (fever, chills, cough, or new shortness of breath).    Prior CV studies:   The following studies were reviewed today: 2D echo , cardiopulmonary stress test  Past Medical History:  Diagnosis Date  . Anxiety   . Arthritis   . Benign essential HTN 11/22/2014  . Diabetes  mellitus, type II ([employer])   . Dyspnea    On exertion  . Fatty liver   . GERD (gastroesophageal reflux disease)   . Hypertension   . IBS (irritable bowel syndrome)   . Migraine   . Obesity (BMI 30-39.9) 11/22/2014  . OSA (obstructive sleep apnea)   . Pneumonia    Past Surgical History:  Procedure Laterality Date  . CHOLECYSTECTOMY N/A 09/14/2019   Procedure: LAPAROSCOPIC CHOLECYSTECTOMY;  Surgeon: Clovis Riley, MD;  Location: [facility];  Service: General;  Laterality: N/A;  . COLONOSCOPY    . TONSILLECTOMY       Current Meds  Medication Sig  .  ALPRAZolam (XANAX) 0.5 MG tablet Take 0.5-2 tablets (0.25-1 mg total) by mouth every 8 (eight) hours as needed for anxiety.  . busPIRone (BUSPAR) 5 MG tablet Take 1 tablet (5 mg total) by mouth daily.  . cetirizine (ZYRTEC) 10 MG tablet Take 10 mg by mouth at bedtime.   [name] dexlansoprazole (DEXILANT) 60 MG capsule Take 60 mg by mouth daily.  . empagliflozin (JARDIANCE) 25 MG TABS tablet Take 25 mg by mouth daily.  . hydrochlorothiazide (HYDRODIURIL) 25 MG tablet Take 12.5 mg by mouth as needed.   [name] ibuprofen (ADVIL) 200 MG tablet Take 600 mg by mouth every 8 (eight) hours as needed for mild pain or moderate pain.   . Probiotic Product (ALIGN) 4 MG CAPS Take 1 capsule by mouth at bedtime.   . rosuvastatin (CRESTOR) 5 MG tablet Take 5 mg by mouth 3 (three) times a week.  . valsartan (DIOVAN) 320 MG tablet Take 320 mg by mouth daily.   . Verapamil HCl CR 300 MG CP24 Take 300 mg by mouth at bedtime.   . Vitamin D, Ergocalciferol, (DRISDOL) 1.25 MG (50000 UNIT) CAPS capsule TAKE 1 CAPSULE BY MOUTH (TWICE) WEEKLY     Allergies:   Glyburide, Metformin and related, Saxenda [liraglutide -weight management], Codeine, Penicillins, and Sulfa antibiotics   Social History   Tobacco Use  . Smoking status: Never Smoker  . Smokeless tobacco: Never Used  Vaping Use  . Vaping Use: Never used  Substance Use Topics  . Alcohol use: Yes    Alcohol/week: 0.0 - 1.0 standard drinks    Comment: occas.  . Drug use: Never     Family Hx: The patient's family history includes Coronary artery disease in her father; Depression in her father and mother; Diabetes in her mother; Heart disease in her mother; High Cholesterol in her father and mother; High blood pressure in her father; Hypertension in her brother and mother; Kidney disease in her mother; Obesity in her mother.  ROS:   Please see the history of present illness.     All other systems reviewed and are negative.   Labs/Other Tests and Data Reviewed:     Recent Labs: 08/22/2019: ALT 70 09/05/2019: BUN 21; Creatinine, Ser 0.72; Hemoglobin 15.0; Platelets 228; Potassium 4.1; Sodium 140   Recent Lipid Panel Lab Results  Component Value Date/Time   CHOL 239 (H) 08/22/2019 02:18 PM   TRIG 288 (H) 08/22/2019 02:18 PM   HDL 40 08/22/2019 02:18 PM   LDLCALC 146 (H) 08/22/2019 02:18 PM    Wt Readings from Last 3 Encounters:  10/17/19 234 lb (106.1 kg)  09/27/19 236 lb (107 kg)  09/05/19 238 lb (108 kg)     Objective:    Vital Signs:  BP 135/85   Pulse 78   Ht 5\' 2"  (1.575 m)   BMI  42.80 kg/m     ASSESSMENT & PLAN:    1.  DOE -she has had a cardiopulmonary stress test a few years ago that was consistent with obesity -her symptoms are worse with exertion and typical housework but I am concerned that she also intermittently has some tightness in her upper arms/shoulders when walking.  She also has significant CRFs including DM and HTN as well as family hx. -I have recommended a coronary CTA to assess for CAD and 2D echo to assess LVF and diastolic function  2.  Morbid Obesity -she is followed in the [facility] -she is trying to lose weight but exercise is limited by her work schedule.  She is going to start back on her exercise bike.  3.  HTN -Bp controlled today -Continue Verapamil  CR 300mg  daily and Valsartan 320mg  daily -continue PRN HCTZ  4.  OSA -followed by Neuro -she uses it nightly  5.  Fm Hx of aortic dissection -bother her father and her brother have had an aortic dissection.  Her brother had poorly controlled HTN and her father's was a complication of CABG surgery.   6.  Hx of PFO -will recheck with bubble study on echo  COVID-19 Education: The signs and symptoms of COVID-19 were discussed with the patient and how to seek care for testing (follow up with PCP or arrange E-visit).  The importance of social distancing was discussed today.  Patient Risk:   After full review of this  patient's clinical status, I feel that they are at least moderate risk at this time.  Time:   Today, I have spent 20 minutes on telemedicine discussing medical problems including DOE, morbid obesity and HTN and reviewing patient's chart including labs, 2D echo, cardiopulmonary stress test.  Medication Adjustments/Labs and Tests Ordered: Current medicines are reviewed at length with the patient today.  Concerns regarding medicines are outlined above.  Tests Ordered: No orders of the defined types were placed in this encounter.  Medication Changes: No orders of the defined types were placed in this encounter.   Disposition:  Follow up PRN  Signed, Fransico Him, MD  10/30/2019 8:35 AM    [place]

## 2019-10-30 NOTE — Patient Instructions (Addendum)
Medication Instructions:  Your physician recommends that you continue on your current medications as directed. Please refer to the Current Medication list given to you today.  *If you need a refill on your cardiac medications before your next appointment, please call your pharmacy*  Testing/Procedures: Your physician has requested that you have an echocardiogram. Echocardiography is a painless test that uses sound waves to create images of your heart. It provides your doctor with information about the size and shape of your heart and how well your hearts chambers and valves are working. This procedure takes approximately one hour. There are no restrictions for this procedure.  Your physician has requested that you have a Coronary CT scan. Please see below for further instructions.   Follow-Up: At [facility], you and your health needs are our priority.  As part of our continuing mission to provide you with exceptional heart care, we have created designated Provider Care Teams.  These Care Teams include your primary Cardiologist (physician) and Advanced Practice Providers (APPs -  Physician Assistants and Nurse Practitioners) who all work together to provide you with the care you need, when you need it.  Follow up with Dr. Radford Pax as needed based on results of testing.    Other Instructions Your cardiac CT will be scheduled at:  [facility] [address] [place], [place] [address] [phone]   Please arrive at the [facility] main entrance of [facility] 30 minutes prior to test start time. Proceed to the [facility] Radiology Department (first floor) to check-in and test prep.   Please follow these instructions carefully (unless otherwise directed):   On the Night Before the Test:  Be sure to Drink plenty of water.  Do not consume any caffeinated/decaffeinated beverages or chocolate 12 hours prior to your test.  Do not take any antihistamines 12 hours  prior to your test.  On the Day of the Test:  Drink plenty of water. Do not drink any water within one hour of the test.  Do not eat any food 4 hours prior to the test.  You may take your regular medications prior to the test.   Take metoprolol (Lopressor) two hours prior to test.  HOLD Furosemide/Hydrochlorothiazide morning of the test.  FEMALES- please wear underwire-free bra if available       After the Test:  Drink plenty of water.  After receiving IV contrast, you may experience a mild flushed feeling. This is normal.  On occasion, you may experience a mild rash up to 24 hours after the test. This is not dangerous. If this occurs, you can take Benadryl 25 mg and increase your fluid intake.  If you experience trouble breathing, this can be serious. If it is severe call 911 IMMEDIATELY. If it is mild, please call our office.  If you take any of these medications: Glipizide/Metformin, Avandament, Glucavance, please do not take 48 hours after completing test unless otherwise instructed.   Once we have confirmed authorization from your insurance company, we will call you to set up a date and time for your test. Based on how quickly your insurance processes prior authorizations requests, please allow up to 4 weeks to be contacted for scheduling your Cardiac CT appointment. Be advised that routine Cardiac CT appointments could be scheduled as many as 8 weeks after your provider has ordered it.  For non-scheduling related questions, please contact the cardiac imaging nurse navigator should you have any questions/concerns: Marchia Bond, Cardiac Imaging Nurse Navigator  Burley Saver, Interim Cardiac Imaging Nurse Navigator  Heart and Vascular Services Direct Office Dial: [phone]   For scheduling needs, including cancellations and rescheduling, please call Vivien Rota at [phone], option 3.

## 2019-11-07 ENCOUNTER — Ambulatory Visit (INDEPENDENT_AMBULATORY_CARE_PROVIDER_SITE_OTHER): Payer: 59 | Admitting: Physician Assistant

## 2019-11-07 ENCOUNTER — Encounter (INDEPENDENT_AMBULATORY_CARE_PROVIDER_SITE_OTHER): Payer: Self-pay | Admitting: Physician Assistant

## 2019-11-07 ENCOUNTER — Other Ambulatory Visit: Payer: Self-pay

## 2019-11-07 VITALS — BP 137/77 | HR 79 | Temp 97.8°F | Ht 62.0 in | Wt 234.0 lb

## 2019-11-07 DIAGNOSIS — Z6841 Body Mass Index (BMI) 40.0 and over, adult: Secondary | ICD-10-CM | POA: Diagnosis not present

## 2019-11-07 DIAGNOSIS — E785 Hyperlipidemia, unspecified: Secondary | ICD-10-CM

## 2019-11-07 DIAGNOSIS — Z9189 Other specified personal risk factors, not elsewhere classified: Secondary | ICD-10-CM | POA: Diagnosis not present

## 2019-11-07 DIAGNOSIS — E559 Vitamin D deficiency, unspecified: Secondary | ICD-10-CM | POA: Diagnosis not present

## 2019-11-07 DIAGNOSIS — E1169 Type 2 diabetes mellitus with other specified complication: Secondary | ICD-10-CM

## 2019-11-07 MED ORDER — VITAMIN D (ERGOCALCIFEROL) 1.25 MG (50000 UNIT) PO CAPS: ORAL_CAPSULE | ORAL | 0 refills | Status: DC

## 2019-11-07 NOTE — Progress Notes (Signed)
Chief Complaint:   OBESITY Rhonda Aguilar is here to discuss her progress with her obesity treatment plan along with follow-up of her obesity related diagnoses. Rhonda Aguilar is on the Category 2 Plan with 2 oz of protein at breakfast and lunch and states she is following her eating plan approximately 60% of the time. Rhonda Aguilar states she is doing 0 minutes 0 times per week.  Today's visit was #: 67 Starting weight: 231 lbs Starting date: 12/08/2017 Today's weight: 234 lbs Today's date: 11/07/2019 Total lbs lost to date: 0 Total lbs lost since last in-office visit: 0  Interim History: Rhonda Aguilar reports that she is having an echo next week by her Cardiologist due to shortness of breath. She is not eating enough protein during the day.  Subjective:   1. Type 2 diabetes mellitus with hyperlipidemia ([name]) Rhonda Aguilar's last A1c was 7.0. She is on Jardiance and she denies hypoglycemia.  2. Vitamin D deficiency Rhonda Aguilar is on Vit D twice weekly. She plans to start walking outside.  3. At risk for hypoglycemia Rhonda Aguilar is at increased risk for hypoglycemia due to changes in diet, diagnosis of diabetes, and/or insulin use.   Assessment/Plan:   1. Type 2 diabetes mellitus with hyperlipidemia ([name]) Good blood sugar control is important to decrease the likelihood of diabetic complications such as nephropathy, neuropathy, limb loss, blindness, coronary artery disease, and death. Intensive lifestyle modification including diet, exercise and weight loss are the first line of treatment for diabetes. Rhonda Aguilar will continue with medications and weight loss.   2. Vitamin D deficiency Low Vitamin D level contributes to fatigue and are associated with obesity, breast, and colon cancer. We will refill prescription Vitamin D for 1 month. Rhonda Aguilar will follow-up for routine testing of Vitamin D, at least 2-3 times per year to avoid over-replacement.  - Vitamin D, Ergocalciferol, (DRISDOL) 1.25 MG (50000 UNIT) CAPS capsule; TAKE 1 CAPSULE BY  MOUTH (TWICE) WEEKLY  Dispense: 10 capsule; Refill: 0  3. At risk for hypoglycemia Rhonda Aguilar was given approximately 15 minutes of counseling today regarding prevention of hypoglycemia. She was advised of symptoms of hypoglycemia. Rhonda Aguilar was instructed to avoid skipping meals, eat regular protein rich meals and schedule low calorie snacks as needed.   Repetitive spaced learning was employed today to elicit superior memory formation and behavioral change  4. Class 3 severe obesity with serious comorbidity and body mass index (BMI) of 40.0 to 44.9 in adult, unspecified obesity type [facility]) Aguilar is currently in the action stage of change. As such, her goal is to continue with weight loss efforts. She has agreed to the Category 3 Plan.   Exercise goals: No exercise has been prescribed at this time.  Behavioral modification strategies: increasing lean protein intake and no skipping meals.  Rhonda Aguilar has agreed to follow-up with our clinic in 3 weeks. She was informed of the importance of frequent follow-up visits to maximize her success with intensive lifestyle modifications for her multiple health conditions.   Objective:   Blood pressure 137/77, pulse 79, temperature 97.8 F (36.6 C), height 5\' 2"  (1.575 m), weight 234 lb (106.1 kg), SpO2 94 %. Body mass index is 42.8 kg/m.  General: Cooperative, alert, well developed, in no acute distress. HEENT: Conjunctivae and lids unremarkable. Cardiovascular: Regular rhythm.  Lungs: Normal work of breathing. Neurologic: No focal deficits.   Lab Results  Component Value Date   CREATININE 0.72 09/05/2019   BUN 21 09/05/2019   NA 140 09/05/2019   K 4.1 09/05/2019  CL 106 09/05/2019   CO2 20 (L) 09/05/2019   Lab Results  Component Value Date   ALT 70 (H) 08/22/2019   AST 49 (H) 08/22/2019   ALKPHOS 89 08/22/2019   BILITOT 0.4 08/22/2019   Lab Results  Component Value Date   HGBA1C 7.0 (H) 08/22/2019   HGBA1C 6.7 (H) 04/19/2019   Lab Results    Component Value Date   INSULIN 33.6 (H) 08/22/2019   INSULIN 40.0 (H) 04/19/2019   INSULIN 40.9 (H) 04/12/2018   INSULIN 38.2 (H) 12/08/2017   Lab Results  Component Value Date   TSH 2.550 12/08/2017   Lab Results  Component Value Date   CHOL 239 (H) 08/22/2019   HDL 40 08/22/2019   LDLCALC 146 (H) 08/22/2019   TRIG 288 (H) 08/22/2019   Lab Results  Component Value Date   WBC 7.6 09/05/2019   HGB 15.0 09/05/2019   HCT 47.0 (H) 09/05/2019   MCV 85.0 09/05/2019   PLT 228 09/05/2019   No results found for: IRON, TIBC, FERRITIN  Attestation Statements:   Reviewed by clinician on day of visit: allergies, medications, problem list, medical history, surgical history, family history, social history, and previous encounter notes.   [name], am acting as transcriptionist for [employer], PA-C.  I have reviewed the above documentation for accuracy and completeness, and I agree with the above. [name], PA-C

## 2019-11-14 MED FILL — VIT D2 1.25 MG (50,000 UNIT: 1.25 MG | 35 days supply | Qty: 10 | Fill #0

## 2019-11-15 ENCOUNTER — Other Ambulatory Visit: Payer: 59 | Admitting: *Deleted

## 2019-11-15 ENCOUNTER — Ambulatory Visit (HOSPITAL_COMMUNITY): Payer: 59 | Attending: Internal Medicine

## 2019-11-15 ENCOUNTER — Other Ambulatory Visit: Payer: Self-pay

## 2019-11-15 ENCOUNTER — Telehealth (HOSPITAL_COMMUNITY): Payer: Self-pay | Admitting: Emergency Medicine

## 2019-11-15 DIAGNOSIS — R0600 Dyspnea, unspecified: Secondary | ICD-10-CM | POA: Insufficient documentation

## 2019-11-15 DIAGNOSIS — R0609 Other forms of dyspnea: Secondary | ICD-10-CM

## 2019-11-15 DIAGNOSIS — Q211 Atrial septal defect: Secondary | ICD-10-CM | POA: Insufficient documentation

## 2019-11-15 DIAGNOSIS — I10 Essential (primary) hypertension: Secondary | ICD-10-CM

## 2019-11-15 DIAGNOSIS — Q2112 Patent foramen ovale: Secondary | ICD-10-CM

## 2019-11-15 LAB — ECHOCARDIOGRAM COMPLETE BUBBLE STUDY
Area-P 1/2: 3.72 cm2
S' Lateral: 2.8 cm

## 2019-11-15 LAB — BASIC METABOLIC PANEL
BUN/Creatinine Ratio: 31 — ABNORMAL HIGH (ref 12–28)
BUN: 24 mg/dL (ref 8–27)
CO2: 18 mmol/L — ABNORMAL LOW (ref 20–29)
Calcium: 9.2 mg/dL (ref 8.7–10.3)
Chloride: 104 mmol/L (ref 96–106)
Creatinine, Ser: 0.78 mg/dL (ref 0.57–1.00)
GFR calc Af Amer: 92 mL/min/{1.73_m2} (ref >59)
GFR calc non Af Amer: 80 mL/min/{1.73_m2} (ref >59)
Glucose: 214 mg/dL — ABNORMAL HIGH (ref 65–99)
Potassium: 3.8 mmol/L (ref 3.5–5.2)
Sodium: 138 mmol/L (ref 134–144)

## 2019-11-15 MED ORDER — SODIUM CHLORIDE 0.9% FLUSH
10.0000 mL | INTRAVENOUS | Status: DC | PRN
  Administered 2019-11-15: 20 mL via INTRAVENOUS

## 2019-11-15 MED FILL — METOPROLOL TARTRATE 100 MG: 100 | 1 days supply | Qty: 1 | Fill #0

## 2019-11-15 NOTE — Telephone Encounter (Signed)
Reaching out to patient to offer assistance regarding upcoming cardiac imaging study; pt verbalizes understanding of appt date/time, parking situation and where to check in, pre-test NPO status and medications ordered, and verified current allergies; name and call back number provided for further questions should they arise Rhonda Bond RN Navigator Cardiac [facility] [facility] [phone] office [phone] cell   Pt failed to pick up medication when it was written, reminded to pick it up this afternoon. Pt appreciated the call.

## 2019-11-19 ENCOUNTER — Ambulatory Visit (HOSPITAL_COMMUNITY)
Admission: RE | Admit: 2019-11-19 | Discharge: 2019-11-19 | Disposition: A | Discharge status: Home / Self Care | Payer: 59 | Source: Ambulatory Visit | Attending: Cardiology | Admitting: Cardiology

## 2019-11-19 ENCOUNTER — Other Ambulatory Visit: Payer: Self-pay

## 2019-11-19 ENCOUNTER — Encounter (HOSPITAL_COMMUNITY): Payer: Self-pay

## 2019-11-19 DIAGNOSIS — E119 Type 2 diabetes mellitus without complications: Secondary | ICD-10-CM | POA: Diagnosis not present

## 2019-11-19 DIAGNOSIS — H52223 Regular astigmatism, bilateral: Secondary | ICD-10-CM | POA: Diagnosis not present

## 2019-11-19 DIAGNOSIS — R0600 Dyspnea, unspecified: Secondary | ICD-10-CM | POA: Insufficient documentation

## 2019-11-19 DIAGNOSIS — Z7984 Long term (current) use of oral hypoglycemic drugs: Secondary | ICD-10-CM | POA: Diagnosis not present

## 2019-11-19 DIAGNOSIS — H5213 Myopia, bilateral: Secondary | ICD-10-CM | POA: Diagnosis not present

## 2019-11-19 DIAGNOSIS — R0609 Other forms of dyspnea: Secondary | ICD-10-CM

## 2019-11-19 MED ORDER — IOHEXOL 350 MG/ML SOLN
100.0000 mL | Freq: Once | INTRAVENOUS | Status: AC | PRN
  Administered 2019-11-19: 100 mL via INTRAVENOUS

## 2019-11-19 MED ORDER — NITROGLYCERIN 0.4 MG SL SUBL
SUBLINGUAL_TABLET | SUBLINGUAL | Status: AC
  Filled 2019-11-19: qty 2

## 2019-11-19 MED ORDER — NITROGLYCERIN 0.4 MG SL SUBL
0.8000 mg | SUBLINGUAL_TABLET | Freq: Once | SUBLINGUAL | Status: AC
  Administered 2019-11-19: 0.8 mg via SUBLINGUAL

## 2019-11-19 NOTE — Discharge Instructions (Signed)
Cardiac CT Angiogram A cardiac CT angiogram is a procedure to look at the heart and the area around the heart. It may be done to help find the cause of chest pains or other symptoms of heart disease. During this procedure, a substance called contrast dye is injected into the blood vessels in the area to be checked. A large X-ray machine, called a CT scanner, then takes detailed pictures of the heart and the surrounding area. The procedure is also sometimes called a coronary CT angiogram, coronary artery scanning, or CTA. A cardiac CT angiogram allows the health care provider to see how well blood is flowing to and from the heart. The health care provider will be able to see if there are any problems, such as:  Blockage or narrowing of the coronary arteries in the heart.  Fluid around the heart.  Signs of weakness or disease in the muscles, valves, and tissues of the heart. Tell a health care provider about:  Any allergies you have. This is especially important if you have had a previous allergic reaction to contrast dye.  All medicines you are taking, including vitamins, herbs, eye drops, creams, and over-the-counter medicines.  Any blood disorders you have.  Any surgeries you have had.  Any medical conditions you have.  Whether you are pregnant or may be pregnant.  Any anxiety disorders, chronic pain, or other conditions you have that may increase your stress or prevent you from lying still. What are the risks? Generally, this is a safe procedure. However, problems may occur, including:  Bleeding.  Infection.  Allergic reactions to medicines or dyes.  Damage to other structures or organs.  Kidney damage from the contrast dye that is used.  Increased risk of cancer from radiation exposure. This risk is low. Talk with your health care provider about: ? The risks and benefits of testing. ? How you can receive the lowest dose of radiation. What happens before the  procedure?  Wear comfortable clothing and remove any jewelry, glasses, dentures, and hearing aids.  Follow instructions from your health care provider about eating and drinking. This may include: ? For 12 hours before the procedure -- avoid caffeine. This includes tea, coffee, soda, energy drinks, and diet pills. Drink plenty of water or other fluids that do not have caffeine in them. Being well hydrated can prevent complications. ? For 4-6 hours before the procedure -- stop eating and drinking. The contrast dye can cause nausea, but this is less likely if your stomach is empty.  Ask your health care provider about changing or stopping your regular medicines. This is especially important if you are taking diabetes medicines, blood thinners, or medicines to treat problems with erections (erectile dysfunction). What happens during the procedure?   Hair on your chest may need to be removed so that small sticky patches called electrodes can be placed on your chest. These will transmit information that helps to monitor your heart during the procedure.  An IV will be inserted into one of your veins.  You might be given a medicine to control your heart rate during the procedure. This will help to ensure that good images are obtained.  You will be asked to lie on an exam table. This table will slide in and out of the CT machine during the procedure.  Contrast dye will be injected into the IV. You might feel warm, or you may get a metallic taste in your mouth.  You will be given a medicine called   nitroglycerin. This will relax or dilate the arteries in your heart.  The table that you are lying on will move into the CT machine tunnel for the scan.  The person running the machine will give you instructions while the scans are being done. You may be asked to: ? Keep your arms above your head. ? Hold your breath. ? Stay very still, even if the table is moving.  When the scanning is complete, you  will be moved out of the machine.  The IV will be removed. The procedure may vary among health care providers and hospitals. What can I expect after the procedure? After your procedure, it is common to have:  A metallic taste in your mouth from the contrast dye.  A feeling of warmth.  A headache from the nitroglycerin. Follow these instructions at home:  Take over-the-counter and prescription medicines only as told by your health care provider.  If you are told, drink enough fluid to keep your urine pale yellow. This will help to flush the contrast dye out of your body.  Most people can return to their normal activities right after the procedure. Ask your health care provider what activities are safe for you.  It is up to you to get the results of your procedure. Ask your health care provider, or the department that is doing the procedure, when your results will be ready.  Keep all follow-up visits as told by your health care provider. This is important. Contact a health care provider if:  You have any symptoms of allergy to the contrast dye. These include: ? Shortness of breath. ? Rash or hives. ? A racing heartbeat. Summary  A cardiac CT angiogram is a procedure to look at the heart and the area around the heart. It may be done to help find the cause of chest pains or other symptoms of heart disease.  During this procedure, a large X-ray machine, called a CT scanner, takes detailed pictures of the heart and the surrounding area after a contrast dye has been injected into blood vessels in the area.  Ask your health care provider about changing or stopping your regular medicines before the procedure. This is especially important if you are taking diabetes medicines, blood thinners, or medicines to treat erectile dysfunction.  If you are told, drink enough fluid to keep your urine pale yellow. This will help to flush the contrast dye out of your body. This information is not  intended to replace advice given to you by your health care provider. Make sure you discuss any questions you have with your health care provider. Document Revised: 10/04/2018 Document Reviewed: 10/04/2018 Elsevier Patient Education  2020 Elsevier Inc[employer] Testing With IV Contrast Material IV contrast material is a fluid that is used with some imaging tests. It is injected into your body through a vein. Contrast material is used when your health care providers need a detailed look at organs, tissues, or blood vessels that may not show up with the standard test. The material may be used when an X-ray, an MRI, a CT scan, or an ultrasound is done. IV contrast material may be used for imaging tests that check:  Muscles, skin, and fat.  Breasts.  Brain.  Digestive tract.  Heart.  Organs such as the liver, kidneys, lungs, bladder, and many others.  Arteries and veins. Tell a health care provider about:  Any allergies you have, especially an allergy to contrast material.  All medicines you are taking, including   metformin, beta blockers, NSAIDs (such as ibuprofen), interleukin-2, vitamins, herbs, eye drops, creams, and over-the-counter medicines.  Any problems you or family members have had with the use of contrast material.  Any blood disorders you have, such as sickle cell anemia.  Any surgeries you have had.  Any medical conditions you have or have had, especially alcohol abuse, dehydration, asthma, or kidney, liver, or heart problems.  Whether you are pregnant or may be pregnant.  Whether you are breastfeeding. Most contrast materials are safe for use in breastfeeding women. What are the risks? Generally, this is a safe procedure. However, problems may occur, including:  Headache.  Itching, skin rash, and hives.  Nausea and vomiting.  Allergic reactions.  Wheezing or difficulty breathing.  Abnormal heart rate.  Changes in blood pressure.  Throat swelling.  Kidney  damage. What happens before the procedure? Medicines Ask your health care provider about:  Changing or stopping your regular medicines. This is especially important if you are taking diabetes medicines or blood thinners.  Taking medicines such as aspirin and ibuprofen. These medicines can thin your blood. Do not take these medicines unless your health care provider tells you to take them.  Taking over-the-counter medicines, vitamins, herbs, and supplements. If you are at risk of having a reaction to the IV contrast material, you may be asked to take medicine before the procedure to prevent a reaction. General instructions  Follow instructions from your health care provider about eating or drinking restrictions.  You may have an exam or lab tests to make sure that you can safely get IV contrast material.  Ask if you will be given a medicine to help you relax (sedative) during the procedure. If so, plan to have someone take you home from the hospital or clinic. What happens during the procedure?  You may be given a sedative to help you relax.  An IV will be inserted into one of your veins.  Contrast material will be injected into your IV.  You may feel warmth or flushing as the contrast material enters your bloodstream.  You may have a metallic taste in your mouth for a few minutes.  The needle may cause some discomfort and bruising.  After the contrast material is in your body, the imaging test will be done. The procedure may vary among health care providers and hospitals. What can I expect after the procedure?  The IV will be removed.  You may be taken to a recovery area if sedation medicines were used. Your blood pressure, heart rate, breathing rate, and blood oxygen level will be monitored until you leave the hospital or clinic. Follow these instructions at home:   Take over-the-counter and prescription medicines only as told by your health care provider. ? Your health  care provider may tell you to not take certain medicines for a couple of days after the procedure. This is especially important if you are taking diabetes medicines.  If you are told, drink enough fluid to keep your urine pale yellow. This will help to remove the contrast material out of your body.  Do not drive for 24 hours if you were given a sedative during your procedure.  It is up to you to get the results of your procedure. Ask your health care provider, or the department that is doing the procedure, when your results will be ready.  Keep all follow-up visits as told by your health care provider. This is important. Contact a health care provider if:    You have redness, swelling, or pain near your IV site. Get help right away if:  You have an abnormal heart rhythm.  You have trouble breathing.  You have: ? Chest pain. ? Pain in your back, neck, arm, jaw, or stomach. ? Nausea or sweating. ? Hives or a rash.  You start shaking and cannot stop. These symptoms may represent a serious problem that is an emergency. Do not wait to see if the symptoms will go away. Get medical help right away. Call your local emergency services (911 in the [place] Do not drive yourself to the hospital. Summary  IV contrast material may be used for imaging tests to help your health care providers see your organs and tissues more clearly.  Tell your health care provider if you are pregnant or may be pregnant.  During the procedure, you may feel warmth or flushing as the contrast material enters your bloodstream.  After the procedure, drink enough fluid to keep your urine pale yellow. This information is not intended to replace advice given to you by your health care provider. Make sure you discuss any questions you have with your health care provider. Document Revised: 04/27/2018 Document Reviewed: 04/27/2018 Elsevier Patient Education  2020 Elsevier Inc.  

## 2019-11-20 ENCOUNTER — Ambulatory Visit (INDEPENDENT_AMBULATORY_CARE_PROVIDER_SITE_OTHER): Payer: 59 | Admitting: Physician Assistant

## 2019-11-21 DIAGNOSIS — G4733 Obstructive sleep apnea (adult) (pediatric): Secondary | ICD-10-CM | POA: Diagnosis not present

## 2019-11-21 DIAGNOSIS — G4730 Sleep apnea, unspecified: Secondary | ICD-10-CM | POA: Diagnosis not present

## 2019-11-27 ENCOUNTER — Other Ambulatory Visit (HOSPITAL_COMMUNITY): Payer: Self-pay | Admitting: Family Medicine

## 2019-11-27 MED FILL — VALSARTAN 320 MG TABLET: 320 | 90 days supply | Qty: 90 | Fill #1

## 2019-11-27 MED FILL — JARDIANCE 25 MG TABLET: 25 | 90 days supply | Qty: 90 | Fill #0

## 2019-11-27 MED FILL — VERAPAMIL ER PM 300 MG CAP: 300 | 90 days supply | Qty: 90 | Fill #1

## 2019-11-30 DIAGNOSIS — C44311 Basal cell carcinoma of skin of nose: Secondary | ICD-10-CM | POA: Diagnosis not present

## 2019-11-30 DIAGNOSIS — D485 Neoplasm of uncertain behavior of skin: Secondary | ICD-10-CM | POA: Diagnosis not present

## 2019-11-30 DIAGNOSIS — L308 Other specified dermatitis: Secondary | ICD-10-CM | POA: Diagnosis not present

## 2019-11-30 DIAGNOSIS — L820 Inflamed seborrheic keratosis: Secondary | ICD-10-CM | POA: Diagnosis not present

## 2019-12-04 ENCOUNTER — Other Ambulatory Visit (HOSPITAL_COMMUNITY): Payer: Self-pay | Admitting: Gastroenterology

## 2019-12-04 MED FILL — DEXILANT DR 60 MG CAPSULE: 60 | 30 days supply | Qty: 30 | Fill #0

## 2019-12-05 ENCOUNTER — Other Ambulatory Visit: Payer: Self-pay

## 2019-12-05 ENCOUNTER — Encounter (INDEPENDENT_AMBULATORY_CARE_PROVIDER_SITE_OTHER): Payer: Self-pay | Admitting: Physician Assistant

## 2019-12-05 ENCOUNTER — Other Ambulatory Visit (HOSPITAL_COMMUNITY): Payer: Self-pay | Admitting: Family Medicine

## 2019-12-05 ENCOUNTER — Ambulatory Visit (INDEPENDENT_AMBULATORY_CARE_PROVIDER_SITE_OTHER): Payer: 59 | Admitting: Physician Assistant

## 2019-12-05 VITALS — BP 135/82 | Temp 97.6°F | Ht 62.0 in | Wt 235.0 lb

## 2019-12-05 DIAGNOSIS — F329 Major depressive disorder, single episode, unspecified: Secondary | ICD-10-CM | POA: Diagnosis not present

## 2019-12-05 DIAGNOSIS — Z6841 Body Mass Index (BMI) 40.0 and over, adult: Secondary | ICD-10-CM | POA: Diagnosis not present

## 2019-12-05 DIAGNOSIS — E1169 Type 2 diabetes mellitus with other specified complication: Secondary | ICD-10-CM | POA: Diagnosis not present

## 2019-12-05 DIAGNOSIS — E782 Mixed hyperlipidemia: Secondary | ICD-10-CM | POA: Diagnosis not present

## 2019-12-05 DIAGNOSIS — R5383 Other fatigue: Secondary | ICD-10-CM | POA: Diagnosis not present

## 2019-12-05 DIAGNOSIS — E785 Hyperlipidemia, unspecified: Secondary | ICD-10-CM

## 2019-12-05 DIAGNOSIS — I10 Essential (primary) hypertension: Secondary | ICD-10-CM | POA: Diagnosis not present

## 2019-12-05 DIAGNOSIS — L259 Unspecified contact dermatitis, unspecified cause: Secondary | ICD-10-CM | POA: Diagnosis not present

## 2019-12-05 DIAGNOSIS — E1165 Type 2 diabetes mellitus with hyperglycemia: Secondary | ICD-10-CM | POA: Diagnosis not present

## 2019-12-05 DIAGNOSIS — Z7984 Long term (current) use of oral hypoglycemic drugs: Secondary | ICD-10-CM | POA: Diagnosis not present

## 2019-12-05 MED FILL — NYSTATIN-TRIAMCINOLONE OINT: 100000-0.1 | 15 days supply | Qty: 30 | Fill #0

## 2019-12-06 NOTE — Progress Notes (Signed)
Chief Complaint:   OBESITY Rhonda Aguilar is here to discuss her progress with her obesity treatment plan along with follow-up of her obesity related diagnoses. Rhonda Aguilar is on the Category 3 Plan and states she is following her eating plan approximately 60% of the time. Rhonda Aguilar states she is walking 4,000-6,000 steps 5 times per week.  Today's visit was #: 85 Starting weight: 231 lbs Starting date: 12/08/2017 Today's weight: 235 lbs Today's date: 12/05/2019 Total lbs lost to date: 0 Total lbs lost since last in-office visit: 0  Interim History: Rhonda Aguilar celebrated her birthday with cupcakes and cheesecake. She recently got an echocardiogram and CTA, which were normal. She wants to start exercising more often.  Subjective:   Type 2 diabetes mellitus with hyperlipidemia ([place]). Rhonda Aguilar is on Jardiance. Last A1c 7.0.   Lab Results  Component Value Date   HGBA1C 7.0 (H) 08/22/2019   HGBA1C 6.7 (H) 04/19/2019   Lab Results  Component Value Date   LDLCALC 146 (H) 08/22/2019   CREATININE 0.78 11/15/2019   Lab Results  Component Value Date   INSULIN 33.6 (H) 08/22/2019   INSULIN 40.0 (H) 04/19/2019   INSULIN 40.9 (H) 04/12/2018   INSULIN 38.2 (H) 12/08/2017   Hypertension, unspecified type, with HLD. Rhonda Aguilar is on Lopressor, Diovan, and Verapamil. Blood pressure is controlled today. She is followed by Cardiology.  BP Readings from Last 3 Encounters:  12/05/19 135/82  11/19/19 116/60  11/07/19 137/77   Lab Results  Component Value Date   CREATININE 0.78 11/15/2019   CREATININE 0.72 09/05/2019   CREATININE 0.78 08/22/2019   Assessment/Plan:   Type 2 diabetes mellitus with hyperlipidemia ([place]). Good blood sugar control is important to decrease the likelihood of diabetic complications such as nephropathy, neuropathy, limb loss, blindness, coronary artery disease, and death. Intensive lifestyle modification including diet, exercise and weight loss are the first line of treatment for  diabetes. Rhonda Aguilar will continue her medication as directed.   Hypertension, unspecified type, with HLD. Rhonda Aguilar is working on healthy weight loss and exercise to improve blood pressure control. We will watch for signs of hypotension as she continues her lifestyle modifications. She will continue her medications as directed and follow-up with Cardiology as scheduled.  Class 3 severe obesity with serious comorbidity and body mass index (BMI) of 40.0 to 44.9 in adult, unspecified obesity type (Rhonda Aguilar).  Rhonda Aguilar is currently in the action stage of change. As such, her goal is to continue with weight loss efforts. She has agreed to the Category 3 Plan.    IC will be performed at her next visit.  Exercise goals: Older adults should follow the adult guidelines. When older adults cannot meet the adult guidelines, they should be as physically active as their abilities and conditions will allow.   Behavioral modification strategies: meal planning and cooking strategies and keeping healthy foods in the home.  Rhonda Aguilar has agreed to follow-up with our clinic in 2-3 weeks. She was informed of the importance of frequent follow-up visits to maximize her success with intensive lifestyle modifications for her multiple health conditions.   Objective:   Blood pressure 135/82, temperature 97.6 F (36.4 C), height 5\' 2"  (1.575 m), weight 235 lb (106.6 kg), SpO2 95 %. Body mass index is 42.98 kg/m.  General: Cooperative, alert, well developed, in no acute distress. HEENT: Conjunctivae and lids unremarkable. Cardiovascular: Regular rhythm.  Lungs: Normal work of breathing. Neurologic: No focal deficits.   Lab Results  Component Value Date  CREATININE 0.78 11/15/2019   BUN 24 11/15/2019   NA 138 11/15/2019   K 3.8 11/15/2019   CL 104 11/15/2019   CO2 18 (L) 11/15/2019   Lab Results  Component Value Date   ALT 70 (H) 08/22/2019   AST 49 (H) 08/22/2019   ALKPHOS 89 08/22/2019   BILITOT 0.4 08/22/2019   Lab  Results  Component Value Date   HGBA1C 7.0 (H) 08/22/2019   HGBA1C 6.7 (H) 04/19/2019   Lab Results  Component Value Date   INSULIN 33.6 (H) 08/22/2019   INSULIN 40.0 (H) 04/19/2019   INSULIN 40.9 (H) 04/12/2018   INSULIN 38.2 (H) 12/08/2017   Lab Results  Component Value Date   TSH 2.550 12/08/2017   Lab Results  Component Value Date   CHOL 239 (H) 08/22/2019   HDL 40 08/22/2019   LDLCALC 146 (H) 08/22/2019   TRIG 288 (H) 08/22/2019   Lab Results  Component Value Date   WBC 7.6 09/05/2019   HGB 15.0 09/05/2019   HCT 47.0 (H) 09/05/2019   MCV 85.0 09/05/2019   PLT 228 09/05/2019   No results found for: IRON, TIBC, FERRITIN  Attestation Statements:   Reviewed by clinician on day of visit: allergies, medications, problem list, medical history, surgical history, family history, social history, and previous encounter notes.  Time spent on visit including pre-visit chart review and post-visit charting and care was 33 minutes.   IMichaelene Song, am acting as transcriptionist for [name], PA-C   I have reviewed the above documentation for accuracy and completeness, and I agree with the above. [name], PA-C '

## 2019-12-11 ENCOUNTER — Other Ambulatory Visit (HOSPITAL_COMMUNITY): Payer: Self-pay | Admitting: Family Medicine

## 2019-12-11 MED FILL — busPIRone HCL 5 MG TABS: 5 | 90 days supply | Qty: 90 | Fill #1

## 2019-12-11 MED FILL — OZEMPIC 0.25 OR 0.5 MG/DOSE: 2 | 90 days supply | Qty: 5 | Fill #0

## 2019-12-20 ENCOUNTER — Other Ambulatory Visit: Payer: Self-pay | Admitting: Family Medicine

## 2019-12-20 DIAGNOSIS — Z1231 Encounter for screening mammogram for malignant neoplasm of breast: Secondary | ICD-10-CM

## 2019-12-24 ENCOUNTER — Ambulatory Visit (INDEPENDENT_AMBULATORY_CARE_PROVIDER_SITE_OTHER): Payer: 59 | Admitting: Physician Assistant

## 2019-12-24 ENCOUNTER — Encounter (INDEPENDENT_AMBULATORY_CARE_PROVIDER_SITE_OTHER): Payer: Self-pay | Admitting: Physician Assistant

## 2019-12-24 ENCOUNTER — Other Ambulatory Visit (INDEPENDENT_AMBULATORY_CARE_PROVIDER_SITE_OTHER): Payer: Self-pay | Admitting: Physician Assistant

## 2019-12-24 ENCOUNTER — Other Ambulatory Visit: Payer: Self-pay

## 2019-12-24 VITALS — BP 148/81 | Temp 97.9°F | Ht 62.0 in | Wt 233.0 lb

## 2019-12-24 DIAGNOSIS — R0602 Shortness of breath: Secondary | ICD-10-CM

## 2019-12-24 DIAGNOSIS — Z9189 Other specified personal risk factors, not elsewhere classified: Secondary | ICD-10-CM | POA: Diagnosis not present

## 2019-12-24 DIAGNOSIS — E1169 Type 2 diabetes mellitus with other specified complication: Secondary | ICD-10-CM | POA: Diagnosis not present

## 2019-12-24 DIAGNOSIS — E559 Vitamin D deficiency, unspecified: Secondary | ICD-10-CM

## 2019-12-24 DIAGNOSIS — E785 Hyperlipidemia, unspecified: Secondary | ICD-10-CM | POA: Diagnosis not present

## 2019-12-24 DIAGNOSIS — Z6841 Body Mass Index (BMI) 40.0 and over, adult: Secondary | ICD-10-CM

## 2019-12-24 MED ORDER — VITAMIN D (ERGOCALCIFEROL) 1.25 MG (50000 UNIT) PO CAPS: ORAL_CAPSULE | ORAL | 0 refills | Status: DC

## 2019-12-24 MED FILL — VIT D2 1.25 MG (50,000 UNIT: 1.25 MG | 35 days supply | Qty: 10 | Fill #0

## 2019-12-24 NOTE — Progress Notes (Signed)
Chief Complaint:   OBESITY Rhonda Aguilar is here to discuss her progress with her obesity treatment plan along with follow-up of her obesity related diagnoses. Rhonda Aguilar is on the Category 3 Plan and states she is following her eating plan approximately 50% of the time. Rhonda Aguilar states she is exercising 0 minutes 0 times per week.  Today's visit was #: 17 Starting weight: 231 lbs Starting date: 12/08/2017 Today's weight: 233 lbs Today's date: 12/24/2019 Total lbs lost to date: 0 Total lbs lost since last in-office visit: 2  Interim History: Rhonda Aguilar states that she has been traveling more often. She reports that she has not done a good job of meal planning. IC today shows a decrease in her RMR to 1694.  Subjective:   Vitamin D deficiency. Rhonda Aguilar is on prescription Vitamin D twice weekly, which she is tolerating well.   Ref. Range 08/22/2019 14:18  Vitamin D, 25-Hydroxy Latest Ref Range: 30.0 - 100.0 ng/mL 32.1   Type 2 diabetes mellitus with hyperlipidemia (Rhonda Aguilar). Rhonda Aguilar is on Jardiance. Her PCP started Ozempic and she is currently taking 0.25 mg weekly and will increase to 0.5 mg in a few weeks.   Lab Results  Component Value Date   HGBA1C 7.0 (H) 08/22/2019   HGBA1C 6.7 (H) 04/19/2019   Lab Results  Component Value Date   LDLCALC 146 (H) 08/22/2019   CREATININE 0.78 11/15/2019   Lab Results  Component Value Date   INSULIN 33.6 (H) 08/22/2019   INSULIN 40.0 (H) 04/19/2019   INSULIN 40.9 (H) 04/12/2018   INSULIN 38.2 (H) 12/08/2017   SOB (shortness of breath). Rhonda Aguilar reports shortness of breath with exertion. She denies dizziness or lightheadedness.  At risk for heart disease. Rhonda Aguilar is at a higher than average risk for cardiovascular disease due to obesity.   Assessment/Plan:   Vitamin D deficiency. Low Vitamin D level contributes to fatigue and are associated with obesity, breast, and colon cancer. She was given a refill on her Vitamin D, Ergocalciferol, (DRISDOL) 1.25 MG  (50000 UNIT) CAPS capsule twice weekly #10 with 0 refills and will follow-up for routine testing of Vitamin D, at least 2-3 times per year to avoid over-replacement.   Type 2 diabetes mellitus with hyperlipidemia ([place]). Good blood sugar control is important to decrease the likelihood of diabetic complications such as nephropathy, neuropathy, limb loss, blindness, coronary artery disease, and death. Intensive lifestyle modification including diet, exercise and weight loss are the first line of treatment for diabetes. Rhonda Aguilar will continue her medications as directed.   SOB (shortness of breath). Kamani's shortness of breath appears to be obesity related and exercise induced. She has agreed to work on weight loss and gradually increase exercise to treat her exercise induced shortness of breath. Will continue to monitor closely. IC was checked today showing an RMR of 1694.  At risk for heart disease. Rhonda Aguilar was given approximately 15 minutes of coronary artery disease prevention counseling today. She is 64 y.o. female and has risk factors for heart disease including obesity. We discussed intensive lifestyle modifications today with an emphasis on specific weight loss instructions and strategies.   Repetitive spaced learning was employed today to elicit superior memory formation and behavioral change.  Class 3 severe obesity with serious comorbidity and body mass index (BMI) of 40.0 to 44.9 in adult, unspecified obesity type ([place]).  Rhonda Aguilar is currently in the action stage of change. As such, her goal is to continue with weight loss efforts. She has agreed  to change plans and will now follow the Category 2 Plan + 100 protein calories.   Exercise goals: Older adults should follow the adult guidelines. When older adults cannot meet the adult guidelines, they should be as physically active as their abilities and conditions will allow.   Behavioral modification strategies: meal planning and cooking  strategies.  Rhonda Aguilar has agreed to follow-up with our clinic in 2-3 weeks. She was informed of the importance of frequent follow-up visits to maximize her success with intensive lifestyle modifications for her multiple health conditions.   Objective:   Blood pressure (!) 148/81, temperature 97.9 F (36.6 C), height 5\' 2"  (1.575 m), weight 233 lb (105.7 kg), SpO2 97 %. Body mass index is 42.62 kg/m.  General: Cooperative, alert, well developed, in no acute distress. HEENT: Conjunctivae and lids unremarkable. Cardiovascular: Regular rhythm.  Lungs: Normal work of breathing. Neurologic: No focal deficits.   Lab Results  Component Value Date   CREATININE 0.78 11/15/2019   BUN 24 11/15/2019   NA 138 11/15/2019   K 3.8 11/15/2019   CL 104 11/15/2019   CO2 18 (L) 11/15/2019   Lab Results  Component Value Date   ALT 70 (H) 08/22/2019   AST 49 (H) 08/22/2019   ALKPHOS 89 08/22/2019   BILITOT 0.4 08/22/2019   Lab Results  Component Value Date   HGBA1C 7.0 (H) 08/22/2019   HGBA1C 6.7 (H) 04/19/2019   Lab Results  Component Value Date   INSULIN 33.6 (H) 08/22/2019   INSULIN 40.0 (H) 04/19/2019   INSULIN 40.9 (H) 04/12/2018   INSULIN 38.2 (H) 12/08/2017   Lab Results  Component Value Date   TSH 2.550 12/08/2017   Lab Results  Component Value Date   CHOL 239 (H) 08/22/2019   HDL 40 08/22/2019   LDLCALC 146 (H) 08/22/2019   TRIG 288 (H) 08/22/2019   Lab Results  Component Value Date   WBC 7.6 09/05/2019   HGB 15.0 09/05/2019   HCT 47.0 (H) 09/05/2019   MCV 85.0 09/05/2019   PLT 228 09/05/2019   No results found for: IRON, TIBC, FERRITIN  Attestation Statements:   Reviewed by clinician on day of visit: allergies, medications, problem list, medical history, surgical history, family history, social history, and previous encounter notes.  IMichaelene Song, am acting as transcriptionist for [name], PA-C   I have reviewed the above documentation for accuracy and  completeness, and I agree with the above. [name], PA-C

## 2019-12-26 DIAGNOSIS — C44311 Basal cell carcinoma of skin of nose: Secondary | ICD-10-CM | POA: Diagnosis not present

## 2020-01-02 DIAGNOSIS — F341 Dysthymic disorder: Secondary | ICD-10-CM | POA: Diagnosis not present

## 2020-01-08 ENCOUNTER — Ambulatory Visit (INDEPENDENT_AMBULATORY_CARE_PROVIDER_SITE_OTHER): Payer: 59 | Admitting: Physician Assistant

## 2020-01-08 MED FILL — DEXILANT DR 60 MG CAPSULE: 60 | 30 days supply | Qty: 30 | Fill #1

## 2020-01-15 ENCOUNTER — Other Ambulatory Visit: Payer: Self-pay

## 2020-01-15 ENCOUNTER — Ambulatory Visit (INDEPENDENT_AMBULATORY_CARE_PROVIDER_SITE_OTHER): Payer: 59 | Admitting: Family Medicine

## 2020-01-15 ENCOUNTER — Encounter (INDEPENDENT_AMBULATORY_CARE_PROVIDER_SITE_OTHER): Payer: Self-pay | Admitting: Family Medicine

## 2020-01-15 VITALS — BP 138/75 | Temp 97.9°F | Ht 62.0 in | Wt 232.0 lb

## 2020-01-15 DIAGNOSIS — Z6841 Body Mass Index (BMI) 40.0 and over, adult: Secondary | ICD-10-CM

## 2020-01-15 DIAGNOSIS — E559 Vitamin D deficiency, unspecified: Secondary | ICD-10-CM | POA: Diagnosis not present

## 2020-01-15 DIAGNOSIS — E1169 Type 2 diabetes mellitus with other specified complication: Secondary | ICD-10-CM

## 2020-01-15 DIAGNOSIS — Z9189 Other specified personal risk factors, not elsewhere classified: Secondary | ICD-10-CM

## 2020-01-15 DIAGNOSIS — F3289 Other specified depressive episodes: Secondary | ICD-10-CM

## 2020-01-16 ENCOUNTER — Other Ambulatory Visit (INDEPENDENT_AMBULATORY_CARE_PROVIDER_SITE_OTHER): Payer: Self-pay | Admitting: Family Medicine

## 2020-01-16 MED ORDER — VITAMIN D (ERGOCALCIFEROL) 1.25 MG (50000 UNIT) PO CAPS: ORAL_CAPSULE | ORAL | 0 refills | Status: DC

## 2020-01-21 NOTE — Progress Notes (Signed)
Chief Complaint:   OBESITY Rhonda Aguilar is here to discuss her progress with her obesity treatment plan along with follow-up of her obesity related diagnoses.   Today's visit was #: 1 Starting weight: 231 lbs Starting date: 12/08/2017 Today's weight: 232 lbs Today's date: 01/15/2020 Total lbs lost to date: +1 Body mass index is 42.43 kg/m.   Interim History: Reneisha's fasting blood sugars range from 106 to 116. Nutrition Plan: the Category 2 Plan for 60-70% of the time. Anti-obesity medications: Ozempic. Side effects: None. Activity: None at this time.  Assessment/Plan:   1. Vitamin D deficiency Not at goal. Current vitamin D is 32.1, tested on 08/22/2019. Optimal goal > 50 ng/dL.   Plan:  [x]   Continue Vitamin D @50 ,000 IU every week. []   Continue home supplement daily. [x]   Follow-up for routine testing of Vitamin D at least 2-3 times per year to avoid over-replacement.  - Refill Vitamin D, Ergocalciferol, (DRISDOL) 1.25 MG (50000 UNIT) CAPS capsule; TAKE 1 CAPSULE BY MOUTH (TWICE) WEEKLY  Dispense: 10 capsule; Refill: 0  2. Type 2 diabetes mellitus with other specified complication, without long-term current use of insulin ([name]) Diabetes Mellitus: needs improvement. Medication: Jardiance 25 mg daily, Ozempic 0.5 mg subcutaneously weekly. Issues reviewed with her: blood sugar goals, complications of diabetes mellitus, hypoglycemia prevention and treatment, exercise, and nutrition.  Lab Results  Component Value Date   HGBA1C 7.0 (H) 08/22/2019   HGBA1C 6.7 (H) 04/19/2019   Lab Results  Component Value Date   LDLCALC 146 (H) 08/22/2019   CREATININE 0.78 11/15/2019   3. Other depression, with emotional eating Improving, but not optimized.  Discussed eating as habit, self-monitoring, stimulus control, regulating eating pattern, coping skills, and barriers to success.   4. At risk for side effect of medication Tammara was given approximately 9 minutes of drug side effect  counseling today, what to expect when increasing Ozempic.  We discussed side effect possibility and risk versus benefits. Kaelan agreed to the medication and will contact this office if these side effects are intolerable.  5. Class 3 severe obesity with serious comorbidity and body mass index (BMI) of 40.0 to 44.9 in adult, unspecified obesity type [facility])  Course: Azyriah is currently in the action stage of change. As such, her goal is to continue with weight loss efforts.   Nutrition goals: She has agreed to the Category 2 Plan.   Exercise goals: For substantial health benefits, adults should do at least 150 minutes (2 hours and 30 minutes) a week of moderate-intensity, or 75 minutes (1 hour and 15 minutes) a week of vigorous-intensity aerobic physical activity, or an equivalent combination of moderate- and vigorous-intensity aerobic activity. Aerobic activity should be performed in episodes of at least 10 minutes, and preferably, it should be spread throughout the week.  Behavioral modification strategies: increasing lean protein intake, decreasing simple carbohydrates, increasing vegetables, increasing water intake and emotional eating strategies.  Noely has agreed to follow-up with our clinic in 2-3 weeks. She was informed of the importance of frequent follow-up visits to maximize her success with intensive lifestyle modifications for her multiple health conditions.   Objective:   Blood pressure 138/75, temperature 97.9 F (36.6 C), temperature source Oral, height 5\' 2"  (1.575 m), weight 232 lb (105.2 kg), SpO2 97 %. Body mass index is 42.43 kg/m.  General: Cooperative, alert, well developed, in no acute distress. HEENT: Conjunctivae and lids unremarkable. Cardiovascular: Regular rhythm.  Lungs: Normal work of breathing. Neurologic: No focal deficits.  Lab Results  Component Value Date   CREATININE 0.78 11/15/2019   BUN 24 11/15/2019   NA 138 11/15/2019   K 3.8 11/15/2019   CL 104  11/15/2019   CO2 18 (L) 11/15/2019   Lab Results  Component Value Date   ALT 70 (H) 08/22/2019   AST 49 (H) 08/22/2019   ALKPHOS 89 08/22/2019   BILITOT 0.4 08/22/2019   Lab Results  Component Value Date   HGBA1C 7.0 (H) 08/22/2019   HGBA1C 6.7 (H) 04/19/2019   Lab Results  Component Value Date   INSULIN 33.6 (H) 08/22/2019   INSULIN 40.0 (H) 04/19/2019   INSULIN 40.9 (H) 04/12/2018   INSULIN 38.2 (H) 12/08/2017   Lab Results  Component Value Date   TSH 2.550 12/08/2017   Lab Results  Component Value Date   CHOL 239 (H) 08/22/2019   HDL 40 08/22/2019   LDLCALC 146 (H) 08/22/2019   TRIG 288 (H) 08/22/2019   Lab Results  Component Value Date   WBC 7.6 09/05/2019   HGB 15.0 09/05/2019   HCT 47.0 (H) 09/05/2019   MCV 85.0 09/05/2019   PLT 228 09/05/2019   Attestation Statements:   Reviewed by clinician on day of visit: allergies, medications, problem list, medical history, surgical history, family history, social history, and previous encounter notes.  I, Water quality scientist, CMA, am acting as transcriptionist for Briscoe Deutscher, DO  I have reviewed the above documentation for accuracy and completeness, and I agree with the above. Briscoe Deutscher, DO

## 2020-01-25 ENCOUNTER — Other Ambulatory Visit: Payer: Self-pay

## 2020-01-25 ENCOUNTER — Ambulatory Visit
Admission: RE | Admit: 2020-01-25 | Discharge: 2020-01-25 | Disposition: A | Discharge status: Home / Self Care | Payer: 59 | Source: Ambulatory Visit | Attending: Family Medicine | Admitting: Family Medicine

## 2020-01-25 DIAGNOSIS — Z1231 Encounter for screening mammogram for malignant neoplasm of breast: Secondary | ICD-10-CM | POA: Diagnosis not present

## 2020-01-28 ENCOUNTER — Other Ambulatory Visit (HOSPITAL_COMMUNITY): Payer: Self-pay | Admitting: Family Medicine

## 2020-01-28 MED FILL — FREESTYLE LANCETS: 90 days supply | Qty: 200 | Fill #0

## 2020-01-28 MED FILL — FREESTYLE LITE TEST STRIP: 90 days supply | Qty: 200 | Fill #0

## 2020-01-29 DIAGNOSIS — F341 Dysthymic disorder: Secondary | ICD-10-CM | POA: Diagnosis not present

## 2020-01-30 ENCOUNTER — Other Ambulatory Visit: Payer: Self-pay | Admitting: Family Medicine

## 2020-01-30 DIAGNOSIS — R928 Other abnormal and inconclusive findings on diagnostic imaging of breast: Secondary | ICD-10-CM

## 2020-02-06 ENCOUNTER — Other Ambulatory Visit: Payer: Self-pay

## 2020-02-06 ENCOUNTER — Other Ambulatory Visit: Payer: Self-pay | Admitting: Family Medicine

## 2020-02-06 ENCOUNTER — Ambulatory Visit (INDEPENDENT_AMBULATORY_CARE_PROVIDER_SITE_OTHER): Payer: 59 | Admitting: Physician Assistant

## 2020-02-06 ENCOUNTER — Encounter (INDEPENDENT_AMBULATORY_CARE_PROVIDER_SITE_OTHER): Payer: Self-pay | Admitting: Physician Assistant

## 2020-02-06 ENCOUNTER — Ambulatory Visit
Admission: RE | Admit: 2020-02-06 | Discharge: 2020-02-06 | Disposition: A | Discharge status: Home / Self Care | Payer: 59 | Source: Ambulatory Visit | Attending: Family Medicine | Admitting: Family Medicine

## 2020-02-06 VITALS — BP 102/68 | HR 80 | Temp 98.0°F | Ht 62.0 in | Wt 231.0 lb

## 2020-02-06 DIAGNOSIS — E7849 Other hyperlipidemia: Secondary | ICD-10-CM

## 2020-02-06 DIAGNOSIS — E1169 Type 2 diabetes mellitus with other specified complication: Secondary | ICD-10-CM | POA: Diagnosis not present

## 2020-02-06 DIAGNOSIS — R928 Other abnormal and inconclusive findings on diagnostic imaging of breast: Secondary | ICD-10-CM

## 2020-02-06 DIAGNOSIS — N63 Unspecified lump in unspecified breast: Secondary | ICD-10-CM

## 2020-02-06 DIAGNOSIS — E785 Hyperlipidemia, unspecified: Secondary | ICD-10-CM | POA: Diagnosis not present

## 2020-02-06 DIAGNOSIS — Z6841 Body Mass Index (BMI) 40.0 and over, adult: Secondary | ICD-10-CM

## 2020-02-06 DIAGNOSIS — N6310 Unspecified lump in the right breast, unspecified quadrant: Secondary | ICD-10-CM | POA: Diagnosis not present

## 2020-02-06 NOTE — Progress Notes (Signed)
Chief Complaint:   OBESITY Rhonda Aguilar is here to discuss her progress with her obesity treatment plan along with follow-up of her obesity related diagnoses. Rhonda Aguilar is on the Category 2 Plan and states she is following her eating plan approximately 50% of the time. Rhonda Aguilar states she is not exercising at this time.  Today's visit was #: 40 Starting weight: 231 lbs Starting date: 12/08/2017 Today's weight: 231 lbs Today's date: 02/06/2020 Total lbs lost to date: 0 Total lbs lost since last in-office visit: 1 lb  Interim History: Rhonda Aguilar reports that Ozempic 0.5 mg makes her nauseous, causes constipation, and decreases her appetite.  She has not been able to eat all of the food on the plan.  Subjective:   1. Type 2 diabetes mellitus with hyperlipidemia ([place]) On Ozempic and Jardiance.  Fasting blood sugar 110-120.  A1c 7.2.  Not taking Jardiance daily due to blood sugars decreasing to the 80-100 range and her getting a headache at that time.  She is getting increased nausea, constipation, and decreased appetite.  Lab Results  Component Value Date   HGBA1C 7.0 (H) 08/22/2019   HGBA1C 6.7 (H) 04/19/2019   Lab Results  Component Value Date   LDLCALC 146 (H) 08/22/2019   CREATININE 0.78 11/15/2019   Lab Results  Component Value Date   INSULIN 33.6 (H) 08/22/2019   INSULIN 40.0 (H) 04/19/2019   INSULIN 40.9 (H) 04/12/2018   INSULIN 38.2 (H) 12/08/2017   2. Other hyperlipidemia Rhonda Aguilar has hyperlipidemia and has been trying to improve her cholesterol levels with intensive lifestyle modification including a low saturated fat diet, exercise and weight loss. She denies any chest pain, claudication or myalgias.  She is on Crestor.  Followed by PCP.  Lab Results  Component Value Date   ALT 70 (H) 08/22/2019   AST 49 (H) 08/22/2019   ALKPHOS 89 08/22/2019   BILITOT 0.4 08/22/2019   Lab Results  Component Value Date   CHOL 239 (H) 08/22/2019   HDL 40 08/22/2019   LDLCALC 146 (H)  08/22/2019   TRIG 288 (H) 08/22/2019   Assessment/Plan:   1. Type 2 diabetes mellitus with hyperlipidemia ([place]) Will follow-up with PCP regarding Ozempic.  2. Other hyperlipidemia Cardiovascular risk and specific lipid/LDL goals reviewed.  We discussed several lifestyle modifications today and Rhonda Aguilar will continue to work on diet, exercise and weight loss efforts. Orders and follow up as documented in patient record.  Continue with weight loss and follow-up with PCP.  Counseling Intensive lifestyle modifications are the first line treatment for this issue. . Dietary changes: Increase soluble fiber. Decrease simple carbohydrates. . Exercise changes: Moderate to vigorous-intensity aerobic activity 150 minutes per week if tolerated. . Lipid-lowering medications: see documented in medical record.  3. Class 3 severe obesity with serious comorbidity and body mass index (BMI) of 40.0 to 44.9 in adult, unspecified obesity type [facility])  Rhonda Aguilar is currently in the action stage of change. As such, her goal is to continue with weight loss efforts. She has agreed to the Category 2 Plan and keeping a food journal and adhering to recommended goals of 1200-1300 calories and 85 grams of protein daily.   Exercise goals: All adults should avoid inactivity. Some physical activity is better than none, and adults who participate in any amount of physical activity gain some health benefits.  Behavioral modification strategies: increasing lean protein intake, no skipping meals and meal planning and cooking strategies.  Rhonda Aguilar has agreed to follow-up with our clinic  in 3 weeks. She was informed of the importance of frequent follow-up visits to maximize her success with intensive lifestyle modifications for her multiple health conditions.   Objective:   Blood pressure 102/68, pulse 80, temperature 98 F (36.7 C), height 5\' 2"  (1.575 m), weight 231 lb (104.8 kg), SpO2 95 %. Body mass index is 42.25 kg/m.  General:  Cooperative, alert, well developed, in no acute distress. HEENT: Conjunctivae and lids unremarkable. Cardiovascular: Regular rhythm.  Lungs: Normal work of breathing. Neurologic: No focal deficits.   Lab Results  Component Value Date   CREATININE 0.78 11/15/2019   BUN 24 11/15/2019   NA 138 11/15/2019   K 3.8 11/15/2019   CL 104 11/15/2019   CO2 18 (L) 11/15/2019   Lab Results  Component Value Date   ALT 70 (H) 08/22/2019   AST 49 (H) 08/22/2019   ALKPHOS 89 08/22/2019   BILITOT 0.4 08/22/2019   Lab Results  Component Value Date   HGBA1C 7.0 (H) 08/22/2019   HGBA1C 6.7 (H) 04/19/2019   Lab Results  Component Value Date   INSULIN 33.6 (H) 08/22/2019   INSULIN 40.0 (H) 04/19/2019   INSULIN 40.9 (H) 04/12/2018   INSULIN 38.2 (H) 12/08/2017   Lab Results  Component Value Date   TSH 2.550 12/08/2017   Lab Results  Component Value Date   CHOL 239 (H) 08/22/2019   HDL 40 08/22/2019   LDLCALC 146 (H) 08/22/2019   TRIG 288 (H) 08/22/2019   Lab Results  Component Value Date   WBC 7.6 09/05/2019   HGB 15.0 09/05/2019   HCT 47.0 (H) 09/05/2019   MCV 85.0 09/05/2019   PLT 228 09/05/2019   Attestation Statements:   Reviewed by clinician on day of visit: allergies, medications, problem list, medical history, surgical history, family history, social history, and previous encounter notes.  Time spent on visit including pre-visit chart review and post-visit care and charting was 35 minutes.   I, Water quality scientist, CMA, am acting as transcriptionist for [name], PA-C  I have reviewed the above documentation for accuracy and completeness, and I agree with the above. [name], PA-C

## 2020-02-07 DIAGNOSIS — C441122 Basal cell carcinoma of skin of right lower eyelid, including canthus: Secondary | ICD-10-CM | POA: Diagnosis not present

## 2020-02-08 MED FILL — DEXILANT DR 60 MG CAPSULE: 60 | 30 days supply | Qty: 30 | Fill #2

## 2020-02-08 MED FILL — VIT D2 1.25 MG (50,000 UNIT: 1.25 MG | 35 days supply | Qty: 10 | Fill #0

## 2020-02-19 DIAGNOSIS — F341 Dysthymic disorder: Secondary | ICD-10-CM | POA: Diagnosis not present

## 2020-02-25 ENCOUNTER — Other Ambulatory Visit (HOSPITAL_COMMUNITY): Payer: Self-pay | Admitting: Family Medicine

## 2020-02-26 ENCOUNTER — Encounter (INDEPENDENT_AMBULATORY_CARE_PROVIDER_SITE_OTHER): Payer: Self-pay | Admitting: Physician Assistant

## 2020-02-26 ENCOUNTER — Ambulatory Visit (INDEPENDENT_AMBULATORY_CARE_PROVIDER_SITE_OTHER): Payer: Medicare HMO | Admitting: Physician Assistant

## 2020-02-26 ENCOUNTER — Other Ambulatory Visit: Payer: Self-pay

## 2020-02-26 VITALS — BP 172/88 | HR 99 | Temp 97.9°F | Ht 62.0 in | Wt 233.0 lb

## 2020-02-26 DIAGNOSIS — G4733 Obstructive sleep apnea (adult) (pediatric): Secondary | ICD-10-CM | POA: Diagnosis not present

## 2020-02-26 DIAGNOSIS — I10 Essential (primary) hypertension: Secondary | ICD-10-CM | POA: Diagnosis not present

## 2020-02-26 DIAGNOSIS — E559 Vitamin D deficiency, unspecified: Secondary | ICD-10-CM | POA: Diagnosis not present

## 2020-02-26 DIAGNOSIS — Z6841 Body Mass Index (BMI) 40.0 and over, adult: Secondary | ICD-10-CM | POA: Diagnosis not present

## 2020-02-26 DIAGNOSIS — E1169 Type 2 diabetes mellitus with other specified complication: Secondary | ICD-10-CM

## 2020-02-26 DIAGNOSIS — G4730 Sleep apnea, unspecified: Secondary | ICD-10-CM | POA: Diagnosis not present

## 2020-02-26 MED FILL — VERAPAMIL ER PM 300 MG CAP: 300 | 90 days supply | Qty: 90 | Fill #0

## 2020-02-27 ENCOUNTER — Other Ambulatory Visit (INDEPENDENT_AMBULATORY_CARE_PROVIDER_SITE_OTHER): Payer: Self-pay | Admitting: Physician Assistant

## 2020-02-27 ENCOUNTER — Ambulatory Visit: Payer: 59 | Admitting: Physician Assistant

## 2020-02-27 MED ORDER — VITAMIN D (ERGOCALCIFEROL) 1.25 MG (50000 UNIT) PO CAPS: ORAL_CAPSULE | ORAL | 0 refills | Status: DC

## 2020-02-28 NOTE — Progress Notes (Signed)
Chief Complaint:   OBESITY Rhonda Aguilar is here to discuss her progress with her obesity treatment plan along with follow-up of her obesity related diagnoses. Rhonda Aguilar is on the Category 2 Plan or keeping a food journal and adhering to recommended goals of 1200-1300 calories and 85 grams of protein daily and states she is following her eating plan approximately 30% of the time. Rhonda Aguilar states she is walking for 12 minutes 1 time per week.  Today's visit was #: 43 Starting weight: 231 lbs Starting date: 12/08/2017 Today's weight: 233 lbs Today's date: 02/26/2020 Total lbs lost to date: 0 Total lbs lost since last in-office visit: 0  Interim History: Rhonda Aguilar reports that she has been eating foods which contain a lot of sodium recently. She feels bloated and her BP in up today. She is not eating on the plan the most of the time. She will start pilates class soon.  Subjective:   1. Essential hypertension Rhonda Aguilar notes that she missed her verapamil dose yesterday. She has been eating fast food the last few days. She has a headache today, but she denies chest pain. Her blood pressure at home yesterday was 155/103.   2. Type 2 diabetes mellitus with other specified complication, without long-term current use of insulin ([name]) Rhonda Aguilar is on [place]. Her last A1c was 7.0. She denies hypoglycemia or polyphagia.  3. Vitamin D deficiency Rhonda Aguilar is on Vit D twice weekly. Her last Vit D level was not at goal.  Assessment/Plan:   1. Essential hypertension Rhonda Aguilar is working on healthy weight loss and exercise to improve blood pressure control. We will watch for signs of hypotension as she continues her lifestyle modifications. Rhonda Aguilar is to monitor her blood pressure at home, decrease sodium intake, and follow up with her primary care physician if her blood pressure continues to be elevated.  2. Type 2 diabetes mellitus with other specified complication, without long-term current use of insulin  ([name]) Rhonda Aguilar, neuropathy, limb loss, blindness, coronary artery disease, and death. Intensive lifestyle modification including diet, exercise and weight loss are the first line of treatment for diabetes. Rhonda Aguilar will continue her medications, and she will check her BGs daily.  3. Vitamin D deficiency Low Vitamin D level contributes to fatigue and are associated with obesity, breast, and colon cancer. We will refill prescription Vitamin D for 1 month. Rhonda Aguilar will follow-up for routine testing of Vitamin D, at least 2-3 times per year to avoid over-replacement.  - Vitamin D, Ergocalciferol, (DRISDOL) 1.25 MG (50000 UNIT) CAPS capsule; TAKE 1 CAPSULE BY MOUTH (TWICE) WEEKLY  Dispense: 10 capsule; Refill: 0  4. Class 3 severe obesity with serious comorbidity and body mass index (BMI) of 40.0 to 44.9 in adult, unspecified obesity type Rhonda Aguilar) Rhonda Aguilar is currently in the action stage of change. As such, her goal is to continue with weight loss efforts. She has agreed to the Category 2 Plan.   Exercise goals: As is. Rhonda Aguilar walking at home information was given.  Behavioral modification strategies: decreasing simple carbohydrates, decreasing sodium intake and no skipping meals.  Rhonda Aguilar has agreed to follow-up with our clinic in 3 weeks. She was informed of the importance of frequent follow-up visits to maximize her success with intensive lifestyle modifications for her multiple health conditions.   Objective:   Blood pressure (!) 172/88, pulse 99, temperature 97.9 F (36.6 C), height 5\' 2"  (1.575 m),  weight 233 lb (105.7 kg), SpO2 98 %. Body mass index is 42.62 kg/m.  General: Cooperative, alert, well developed, in no acute distress. HEENT: Conjunctivae and lids unremarkable. Cardiovascular: Regular rhythm.  Lungs: Normal work of breathing. Neurologic: No focal deficits.   Lab Results   Component Value Date   CREATININE 0.78 11/15/2019   BUN 24 11/15/2019   NA 138 11/15/2019   K 3.8 11/15/2019   CL 104 11/15/2019   CO2 18 (L) 11/15/2019   Lab Results  Component Value Date   ALT 70 (H) 08/22/2019   AST 49 (H) 08/22/2019   ALKPHOS 89 08/22/2019   BILITOT 0.4 08/22/2019   Lab Results  Component Value Date   HGBA1C 7.0 (H) 08/22/2019   HGBA1C 6.7 (H) 04/19/2019   Lab Results  Component Value Date   INSULIN 33.6 (H) 08/22/2019   INSULIN 40.0 (H) 04/19/2019   INSULIN 40.9 (H) 04/12/2018   INSULIN 38.2 (H) 12/08/2017   Lab Results  Component Value Date   TSH 2.550 12/08/2017   Lab Results  Component Value Date   CHOL 239 (H) 08/22/2019   HDL 40 08/22/2019   LDLCALC 146 (H) 08/22/2019   TRIG 288 (H) 08/22/2019   Lab Results  Component Value Date   WBC 7.6 09/05/2019   HGB 15.0 09/05/2019   HCT 47.0 (H) 09/05/2019   MCV 85.0 09/05/2019   PLT 228 09/05/2019   No results found for: IRON, TIBC, FERRITIN  Obesity Behavioral Intervention:   Approximately 15 minutes were spent on the discussion below.  ASK: We discussed the diagnosis of obesity with Abish today and Kristinia agreed to give [name] permission to discuss obesity behavioral modification therapy today.  ASSESS: Alaythia has the diagnosis of obesity and her BMI today is 42.61. Elyanna is in the action stage of change.   ADVISE: Maiden was educated on the multiple health risks of obesity as well as the benefit of weight loss to improve her health. She was advised of the need for long term treatment and the importance of lifestyle modifications to improve her current health and to decrease her risk of future health problems.  AGREE: Multiple dietary modification options and treatment options were discussed and Lekeysha agreed to follow the recommendations documented in the above note.  ARRANGE: Lanae was educated on the importance of frequent visits to treat obesity as outlined per CMS and USPSTF  guidelines and agreed to schedule her next follow up appointment today.  Attestation Statements:   Reviewed by clinician on day of visit: allergies, medications, problem list, medical history, surgical history, family history, social history, and previous encounter notes.   [name], am acting as transcriptionist for [employer], PA-C.  I have reviewed the above documentation for accuracy and completeness, and I agree with the above. [name], PA-C

## 2020-03-03 ENCOUNTER — Other Ambulatory Visit: Payer: Self-pay | Admitting: Physician Assistant

## 2020-03-03 ENCOUNTER — Ambulatory Visit (INDEPENDENT_AMBULATORY_CARE_PROVIDER_SITE_OTHER): Payer: Medicare HMO | Admitting: Physician Assistant

## 2020-03-03 ENCOUNTER — Encounter: Payer: Self-pay | Admitting: Physician Assistant

## 2020-03-03 ENCOUNTER — Other Ambulatory Visit: Payer: Self-pay

## 2020-03-03 DIAGNOSIS — F411 Generalized anxiety disorder: Secondary | ICD-10-CM

## 2020-03-03 DIAGNOSIS — Z789 Other specified health status: Secondary | ICD-10-CM | POA: Diagnosis not present

## 2020-03-03 DIAGNOSIS — R69 Illness, unspecified: Secondary | ICD-10-CM | POA: Diagnosis not present

## 2020-03-03 MED ORDER — BUSPIRONE HCL 5 MG PO TABS: 5.0000 mg | ORAL_TABLET | Freq: Every day | ORAL | 1 refills | Status: DC

## 2020-03-03 MED ORDER — ALPRAZOLAM 0.5 MG PO TABS: 0.2500 mg | ORAL_TABLET | Freq: Three times a day (TID) | ORAL | 0 refills | Status: DC | PRN

## 2020-03-03 MED FILL — ALPRAZolam 0.5 MG TABS: 0.5 | 5 days supply | Qty: 30 | Fill #0

## 2020-03-03 MED FILL — busPIRone HCL 5 MG TABS: 5 | 90 days supply | Qty: 90 | Fill #0

## 2020-03-03 NOTE — Progress Notes (Signed)
[facility]  Patient ID: Rhonda Aguilar,  MRN: [id number]  PCP: Glenis Smoker, MD  Date of Evaluation: 03/03/2020 Time spent:20 minutes  Chief Complaint:  Chief Complaint    Anxiety      HISTORY/CURRENT STATUS: HPI For f/u of anxiety.  Is doing pretty well.  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  She has just gone to 2 days a week so the new has not worn off yet.  Feels like a weight has lifted from her shoulders.  Anxiety is controlled. Not having itchiness of her scalp now like she has in the past when she was more anxious.  Still uses the Xanax but only rarely.  When she does use it she will only take a fourth of a pill most of the time.  Has trouble sleeping some nights and will take the Xanax to help her go to sleep.  Patient denies loss of interest in usual activities and is able to enjoy things.  Denies decreased energy or motivation.  Appetite has not changed.  No extreme sadness, tearfulness, or feelings of hopelessness.  Denies any changes in concentration, making decisions or remembering things.  Denies suicidal or homicidal thoughts.  Patient denies increased energy with decreased need for sleep, no increased talkativeness, no racing thoughts, no impulsivity or risky behaviors, no increased spending, no increased libido, no grandiosity, no increased irritability or anger, and no hallucinations.  Denies dizziness, syncope, seizures, numbness, tingling, tremor, unsteady gait, slurred speech, confusion. Denies muscle or joint pain, stiffness, or dystonia.  Individual Medical History/ Review of Systems: Changes? :No    Past medications for mental health diagnoses include:  Pristiq, Zoloft caused her to be more depressed.  Allergies: Glyburide, Metformin and related, Saxenda [liraglutide -weight management], Codeine, Penicillins, and Sulfa antibiotics  Current Medications:  Current  Outpatient Medications:  .  cetirizine (ZYRTEC) 10 MG tablet, Take 10 mg by mouth at bedtime. , Disp: , Rfl:  .  dexlansoprazole (DEXILANT) 60 MG capsule, Take 60 mg by mouth daily., Disp: , Rfl:  .  empagliflozin (JARDIANCE) 25 MG TABS tablet, Take 25 mg by mouth daily., Disp: , Rfl:  .  hydrochlorothiazide (HYDRODIURIL) 25 MG tablet, Take 12.5 mg by mouth as needed. , Disp: , Rfl:  .  ibuprofen (ADVIL) 200 MG tablet, Take 600 mg by mouth every 8 (eight) hours as needed for mild pain or moderate pain. , Disp: , Rfl:  .  Probiotic Product (ALIGN) 4 MG CAPS, Take 1 capsule by mouth at bedtime. , Disp: , Rfl:  .  rosuvastatin (CRESTOR) 5 MG tablet, Take 5 mg by mouth 3 (three) times a week., Disp: , Rfl:  .  Semaglutide,0.25 or 0.5MG /DOS, (OZEMPIC, 0.25 OR 0.5 MG/DOSE,) 2 MG/1.5ML SOPN, Inject 0.5 mg into the skin., Disp: , Rfl:  .  valsartan (DIOVAN) 320 MG tablet, Take 320 mg by mouth daily. , Disp: , Rfl:  .  Verapamil HCl CR 300 MG CP24, Take 300 mg by mouth at bedtime. , Disp: , Rfl:  .  Vitamin D, Ergocalciferol, (DRISDOL) 1.25 MG (50000 UNIT) CAPS capsule, TAKE 1 CAPSULE BY MOUTH (TWICE) WEEKLY, Disp: 10 capsule, Rfl: 0 .  ALPRAZolam (XANAX) 0.5 MG tablet, Take 0.5-2 tablets (0.25-1 mg total) by mouth every 8 (eight) hours as needed for anxiety., Disp: 30 tablet, Rfl: 0 .  busPIRone (BUSPAR) 5 MG tablet, Take 1 tablet (5 mg total)  by mouth daily., Disp: 90 tablet, Rfl: 1 .  metoprolol tartrate (LOPRESSOR) 100 MG tablet, Take 1 tablet (100 mg total) by mouth once for 1 dose., Disp: 1 tablet, Rfl: 0 No current facility-administered medications for this visit.  Facility-Administered Medications Ordered in Other Visits:  .  sodium chloride flush (NS) 0.9 % injection 10 mL, 10 mL, Intravenous, PRN, Radford Pax, Traci R, MD, 20 mL at 11/15/19 2297 Medication Side Effects: none  Family Medical/ Social History: Changes?  Semiretired  MENTAL HEALTH EXAM:  There were no vitals taken for this  visit.There is no height or weight on file to calculate BMI.  General Appearance: Casual, Neat, Well Groomed and Obese  Eye Contact:  Good  Speech:  Clear and Coherent and Normal Rate  Volume:  Normal  Mood:  Euthymic  Affect:  Appropriate  Thought Process:  Goal Directed and Descriptions of Associations: Intact  Orientation:  Full (Time, Place, and Person)  Thought Content: Logical   Suicidal Thoughts:  No  Homicidal Thoughts:  No  Memory:  WNL  Judgement:  Good  Insight:  Good  Psychomotor Activity:  Normal  Concentration:  Concentration: Good and Attention Span: Good  Recall:  Good  Fund of Knowledge: Good  Language: Good  Assets:  Desire for Improvement  ADL's:  Intact  Cognition: WNL  Prognosis:  Good    DIAGNOSES:    ICD-10-CM   1. Generalized anxiety disorder  F41.1   2. History of recent change in lifestyle  Z78.9     Receiving Psychotherapy: No    RECOMMENDATIONS:  PDMP reviewed. I provided 20 mins of face to face time during this encounter in which we discussed her diagnosis and treatment.  She is doing well! Continue BuSpar 5 mg, qd.  Continue Xanax 0.5 mg, 1/2-1 p.o. 3 times daily as needed. Return in 6 months.  Donnal Moat, PA-C

## 2020-03-07 ENCOUNTER — Other Ambulatory Visit (HOSPITAL_COMMUNITY): Payer: Self-pay | Admitting: Family Medicine

## 2020-03-07 MED FILL — VALSARTAN 320 MG TABLET: 320 | 90 days supply | Qty: 90 | Fill #0

## 2020-03-07 MED FILL — JARDIANCE 25 MG TABLET: 25 | 90 days supply | Qty: 90 | Fill #0

## 2020-03-11 MED FILL — DEXILANT DR 60 MG CAPSULE: 60 | 30 days supply | Qty: 30 | Fill #3

## 2020-03-13 MED FILL — VIT D2 1.25 MG (50,000 UNIT: 1.25 MG | 35 days supply | Qty: 10 | Fill #0

## 2020-03-18 ENCOUNTER — Ambulatory Visit (INDEPENDENT_AMBULATORY_CARE_PROVIDER_SITE_OTHER): Payer: Medicare HMO | Admitting: Physician Assistant

## 2020-03-18 ENCOUNTER — Telehealth (INDEPENDENT_AMBULATORY_CARE_PROVIDER_SITE_OTHER): Payer: Medicare HMO | Admitting: Bariatrics

## 2020-03-18 ENCOUNTER — Encounter (INDEPENDENT_AMBULATORY_CARE_PROVIDER_SITE_OTHER): Payer: Self-pay | Admitting: Bariatrics

## 2020-03-18 DIAGNOSIS — Z6841 Body Mass Index (BMI) 40.0 and over, adult: Secondary | ICD-10-CM

## 2020-03-18 DIAGNOSIS — E785 Hyperlipidemia, unspecified: Secondary | ICD-10-CM | POA: Diagnosis not present

## 2020-03-18 DIAGNOSIS — E1169 Type 2 diabetes mellitus with other specified complication: Secondary | ICD-10-CM

## 2020-03-19 NOTE — Progress Notes (Signed)
TeleHealth Visit:  Due to the COVID-19 pandemic, this visit was completed with telemedicine (audio/video) technology to reduce patient and provider exposure as well as to preserve personal protective equipment.   Rhonda Aguilar has verbally consented to this TeleHealth visit. The patient is located at home, the provider is located at the [facility] office. The participants in this visit include the listed provider and patient. The visit was conducted today via MyChart video.  Chief Complaint: OBESITY Rhonda Aguilar is here to discuss her progress with her obesity treatment plan along with follow-up of her obesity related diagnoses. Rhonda Aguilar is on the Category 2 Plan +190 calories and states she is following her eating plan approximately 30% of the time. Rhonda Aguilar states she has taken 3 pilates classes for 50 minutes each.  Today's visit was #: 38 Starting weight: 231 lbs Starting date: 12/08/2017  Interim History: Rhonda Aguilar says that her weight remains the same, but may be up about 2 pounds.  She has not been able to stick to the plan.  Subjective:   1. Type 2 diabetes mellitus with other specified complication, without long-term current use of insulin ([place]) She is taking Jardiance and Ozempic.  Lab Results  Component Value Date   HGBA1C 7.0 (H) 08/22/2019   HGBA1C 6.7 (H) 04/19/2019   Lab Results  Component Value Date   LDLCALC 146 (H) 08/22/2019   CREATININE 0.78 11/15/2019   Lab Results  Component Value Date   INSULIN 33.6 (H) 08/22/2019   INSULIN 40.0 (H) 04/19/2019   INSULIN 40.9 (H) 04/12/2018   INSULIN 38.2 (H) 12/08/2017   2. Hyperlipidemia associated with type 2 diabetes mellitus ([place]) Rhonda Aguilar has hyperlipidemia and has been trying to improve her cholesterol levels with intensive lifestyle modification including a low saturated fat diet, exercise and weight loss. She denies any chest pain, claudication or myalgias.  Taking medications as directed.   Lab Results  Component  Value Date   ALT 70 (H) 08/22/2019   AST 49 (H) 08/22/2019   ALKPHOS 89 08/22/2019   BILITOT 0.4 08/22/2019   Lab Results  Component Value Date   CHOL 239 (H) 08/22/2019   HDL 40 08/22/2019   LDLCALC 146 (H) 08/22/2019   TRIG 288 (H) 08/22/2019   Assessment/Plan:   1. Type 2 diabetes mellitus with other specified complication, without long-term current use of insulin ([name]) Good blood sugar control is important to decrease the likelihood of diabetic complications such as nephropathy, neuropathy, limb loss, blindness, coronary artery disease, and death. Intensive lifestyle modification including diet, exercise and weight loss are the first line of treatment for diabetes.  Continue medications.   2. Hyperlipidemia associated with type 2 diabetes mellitus ([place]) Cardiovascular risk and specific lipid/LDL goals reviewed.  We discussed several lifestyle modifications today and Rhonda Aguilar will continue to work on diet, exercise and weight loss efforts. Orders and follow up as documented in patient record.  Continue medications.    Counseling Intensive lifestyle modifications are the first line treatment for this issue. . Dietary changes: Increase soluble fiber. Decrease simple carbohydrates. . Exercise changes: Moderate to vigorous-intensity aerobic activity 150 minutes per week if tolerated. . Lipid-lowering medications: see documented in medical record.  3. Class 3 severe obesity with serious comorbidity and body mass index (BMI) of 40.0 to 44.9 in adult, unspecified obesity type [facility])  Rhonda Aguilar is currently in the action stage of change. As such, her goal is to continue with weight loss efforts. She has agreed to the Category 2  Plan.   She will work on meal planning and will be more adherent to the plan.  Recipes II handout was provided today.  Exercise goals: Pilates classes x50 minutes, walk or ride stationary bike.  Behavioral modification strategies: increasing lean protein intake,  decreasing simple carbohydrates, increasing vegetables, increasing water intake, decreasing eating out, no skipping meals, meal planning and cooking strategies, keeping healthy foods in the home and planning for success.  Rhonda Aguilar has agreed to follow-up with our clinic in 2-3 weeks.  Will get labs at next visit.  She was informed of the importance of frequent follow-up visits to maximize her success with intensive lifestyle modifications for her multiple health conditions.  Objective:   VITALS: Per patient if applicable, see vitals. GENERAL: Alert and in no acute distress. CARDIOPULMONARY: No increased WOB. Speaking in clear sentences.  PSYCH: Pleasant and cooperative. Speech normal rate and rhythm. Affect is appropriate. Insight and judgement are appropriate. Attention is focused, linear, and appropriate.  NEURO: Oriented as arrived to appointment on time with no prompting.   Lab Results  Component Value Date   CREATININE 0.78 11/15/2019   BUN 24 11/15/2019   NA 138 11/15/2019   K 3.8 11/15/2019   CL 104 11/15/2019   CO2 18 (L) 11/15/2019   Lab Results  Component Value Date   ALT 70 (H) 08/22/2019   AST 49 (H) 08/22/2019   ALKPHOS 89 08/22/2019   BILITOT 0.4 08/22/2019   Lab Results  Component Value Date   HGBA1C 7.0 (H) 08/22/2019   HGBA1C 6.7 (H) 04/19/2019   Lab Results  Component Value Date   INSULIN 33.6 (H) 08/22/2019   INSULIN 40.0 (H) 04/19/2019   INSULIN 40.9 (H) 04/12/2018   INSULIN 38.2 (H) 12/08/2017   Lab Results  Component Value Date   TSH 2.550 12/08/2017   Lab Results  Component Value Date   CHOL 239 (H) 08/22/2019   HDL 40 08/22/2019   LDLCALC 146 (H) 08/22/2019   TRIG 288 (H) 08/22/2019   Lab Results  Component Value Date   WBC 7.6 09/05/2019   HGB 15.0 09/05/2019   HCT 47.0 (H) 09/05/2019   MCV 85.0 09/05/2019   PLT 228 09/05/2019   Attestation Statements:   Reviewed by clinician on day of visit: allergies, medications, problem list,  medical history, surgical history, family history, social history, and previous encounter notes.  I, Water quality scientist, CMA, am acting as Location manager for [employer], DO  I have reviewed the above documentation for accuracy and completeness, and I agree with the above. Jearld Lesch, DO

## 2020-03-20 ENCOUNTER — Encounter (INDEPENDENT_AMBULATORY_CARE_PROVIDER_SITE_OTHER): Payer: Self-pay | Admitting: Bariatrics

## 2020-03-28 ENCOUNTER — Other Ambulatory Visit (HOSPITAL_COMMUNITY): Payer: Self-pay | Admitting: Family Medicine

## 2020-03-28 MED FILL — OZEMPIC 0.25 OR 0.5 MG/DOSE: 2 | 90 days supply | Qty: 5 | Fill #0

## 2020-04-08 ENCOUNTER — Encounter (INDEPENDENT_AMBULATORY_CARE_PROVIDER_SITE_OTHER): Payer: Self-pay | Admitting: Physician Assistant

## 2020-04-08 ENCOUNTER — Other Ambulatory Visit: Payer: Self-pay

## 2020-04-08 ENCOUNTER — Other Ambulatory Visit (INDEPENDENT_AMBULATORY_CARE_PROVIDER_SITE_OTHER): Payer: Self-pay | Admitting: Physician Assistant

## 2020-04-08 ENCOUNTER — Ambulatory Visit (INDEPENDENT_AMBULATORY_CARE_PROVIDER_SITE_OTHER): Payer: Medicare HMO | Admitting: Physician Assistant

## 2020-04-08 VITALS — BP 133/82 | HR 74 | Temp 97.3°F | Ht 62.0 in | Wt 233.0 lb

## 2020-04-08 DIAGNOSIS — E1169 Type 2 diabetes mellitus with other specified complication: Secondary | ICD-10-CM | POA: Diagnosis not present

## 2020-04-08 DIAGNOSIS — Z6841 Body Mass Index (BMI) 40.0 and over, adult: Secondary | ICD-10-CM | POA: Diagnosis not present

## 2020-04-08 DIAGNOSIS — E785 Hyperlipidemia, unspecified: Secondary | ICD-10-CM | POA: Diagnosis not present

## 2020-04-08 DIAGNOSIS — E559 Vitamin D deficiency, unspecified: Secondary | ICD-10-CM

## 2020-04-08 DIAGNOSIS — R69 Illness, unspecified: Secondary | ICD-10-CM | POA: Diagnosis not present

## 2020-04-08 MED ORDER — VITAMIN D (ERGOCALCIFEROL) 1.25 MG (50000 UNIT) PO CAPS: ORAL_CAPSULE | ORAL | 0 refills | Status: DC

## 2020-04-08 MED FILL — VIT D2 1.25 MG (50,000 UNIT: 1.25 MG | 35 days supply | Qty: 10 | Fill #0

## 2020-04-09 LAB — COMPREHENSIVE METABOLIC PANEL
ALT: 58 IU/L — ABNORMAL HIGH (ref 0–32)
AST: 41 IU/L — ABNORMAL HIGH (ref 0–40)
Albumin/Globulin Ratio: 1.8 (ref 1.2–2.2)
Albumin: 4.4 g/dL (ref 3.8–4.8)
Alkaline Phosphatase: 93 IU/L (ref 44–121)
BUN/Creatinine Ratio: 23 (ref 12–28)
BUN: 20 mg/dL (ref 8–27)
Bilirubin Total: 0.8 mg/dL (ref 0.0–1.2)
CO2: 21 mmol/L (ref 20–29)
Calcium: 9.1 mg/dL (ref 8.7–10.3)
Chloride: 101 mmol/L (ref 96–106)
Creatinine, Ser: 0.86 mg/dL (ref 0.57–1.00)
GFR calc Af Amer: 81 mL/min/{1.73_m2} (ref >59)
GFR calc non Af Amer: 71 mL/min/{1.73_m2} (ref >59)
Globulin, Total: 2.4 g/dL (ref 1.5–4.5)
Glucose: 118 mg/dL — ABNORMAL HIGH (ref 65–99)
Potassium: 4 mmol/L (ref 3.5–5.2)
Sodium: 137 mmol/L (ref 134–144)
Total Protein: 6.8 g/dL (ref 6.0–8.5)

## 2020-04-09 LAB — INSULIN, RANDOM: INSULIN: 43.2 u[IU]/mL — ABNORMAL HIGH (ref 2.6–24.9)

## 2020-04-09 LAB — LIPID PANEL
Chol/HDL Ratio: 5.4 ratio — ABNORMAL HIGH (ref 0.0–4.4)
Cholesterol, Total: 227 mg/dL — ABNORMAL HIGH (ref 100–199)
HDL: 42 mg/dL (ref >39)
LDL Chol Calc (NIH): 138 mg/dL — ABNORMAL HIGH (ref 0–99)
Triglycerides: 259 mg/dL — ABNORMAL HIGH (ref 0–149)
VLDL Cholesterol Cal: 47 mg/dL — ABNORMAL HIGH (ref 5–40)

## 2020-04-09 LAB — HEMOGLOBIN A1C
Est. average glucose Bld gHb Est-mCnc: 131 mg/dL
Hgb A1c MFr Bld: 6.2 % — ABNORMAL HIGH (ref 4.8–5.6)

## 2020-04-09 LAB — VITAMIN D 25 HYDROXY (VIT D DEFICIENCY, FRACTURES): Vit D, 25-Hydroxy: 46.6 ng/mL (ref 30.0–100.0)

## 2020-04-09 NOTE — Progress Notes (Signed)
Chief Complaint:   OBESITY Rhonda Aguilar is here to discuss her progress with her obesity treatment plan along with follow-up of her obesity related diagnoses. Rhonda Aguilar is on the Category 2 Plan and states she is following her eating plan approximately 40% of the time. Rhonda Aguilar states she is doing pilates for 60 minutes 2 times per week.  Today's visit was #: 89 Starting weight: 231 lbs Starting date: 12/08/2017 Today's weight: 233 lbs Today's date: 04/08/2020 Total lbs lost to date: 0 Total lbs lost since last in-office visit: 0  Interim History: Rhonda Aguilar is recovering from [place] and continues to have sinus congestion. She started doing pilates but had to take a break due to Rhonda Aguilar. She has been drinking more water and cut out soda the last week. She states she is not eating enough protein.  Subjective:   1. Hyperlipidemia associated with type 2 diabetes mellitus (Rhonda Aguilar) Rhonda Aguilar is on Crestor, and she sees Cardiology. She denies myalgias or chest pain.   2. Vitamin D Aguilar Rhonda Aguilar is on Vit D twice weekly.   3. Type 2 diabetes mellitus with other specified complication, without long-term current use of insulin ([name]) Carma's last A1c 7.0. She is on Ozempic 0.5 mg and Jaridance. Her fasting BGs range between 116 and 130. She denies hypoglycemia.  Assessment/Plan:   1. Hyperlipidemia associated with type 2 diabetes mellitus ([place]) Cardiovascular risk and specific lipid/LDL goals reviewed. We discussed several lifestyle modifications today. We will check labs today. Rhonda Aguilar will continue her medications, and will continue to work on diet, increase activity, and weight loss efforts. Orders and follow up as documented in patient record.   Counseling Intensive lifestyle modifications are the first line treatment for this issue. . Dietary changes: Increase soluble fiber. Decrease simple carbohydrates. . Exercise changes: Moderate to vigorous-intensity aerobic activity 150 minutes per week if  tolerated. . Lipid-lowering medications: see documented in medical record.  - Lipid panel  2. Vitamin D Aguilar Low Vitamin D level contributes to fatigue and are associated with obesity, breast, and colon cancer. We will check labs today, and we will refill prescription Vitamin D for 1 month. Rhonda Aguilar will follow-up for routine testing of Vitamin D, at least 2-3 times per year to avoid over-replacement.  - Vitamin D, Ergocalciferol, (DRISDOL) 1.25 MG (50000 UNIT) CAPS capsule; TAKE 1 CAPSULE BY MOUTH (TWICE) WEEKLY  Dispense: 10 capsule; Refill: 0 - VITAMIN D 25 Hydroxy (Rhonda Aguilar, Rhonda Aguilar)  3. Type 2 diabetes mellitus with other specified complication, without long-term current use of insulin ([name]) Good blood sugar control is important to decrease the likelihood of diabetic complications such as nephropathy, neuropathy, limb loss, blindness, coronary artery disease, and death. Intensive lifestyle modification including diet, exercise and weight loss are the first line of treatment for diabetes. We will check labs today, and Rhonda Aguilar will continue her medications.  - Comprehensive metabolic panel - Hemoglobin A1c - Insulin, random  4. Class 3 severe obesity with serious comorbidity and body mass index (BMI) of 40.0 to 44.9 in adult, unspecified obesity type Rhonda Aguilar) Rhonda Aguilar is currently in the action stage of change. As such, her goal is to continue with weight loss efforts. She has agreed to the Category 2 Plan + 100 calories.   Exercise goals: As is.  Behavioral modification strategies: increasing lean protein intake and decreasing eating out.  Rhonda Aguilar has agreed to follow-up with our clinic in 3 weeks. She was informed of the importance of frequent follow-up visits to maximize her success with  intensive lifestyle modifications for her multiple health conditions.   Rhonda Aguilar was informed we would discuss her lab results at her next visit unless there is a critical issue that needs to be  addressed sooner. Rhonda Aguilar agreed to keep her next visit at the agreed upon time to discuss these results.  Objective:   Blood pressure 133/82, pulse 74, temperature (!) 97.3 F (36.3 C), height 5\' 2"  (1.575 m), weight 233 lb (105.7 kg), SpO2 96 %. Body mass index is 42.62 kg/m.  General: Cooperative, alert, well developed, in no acute distress. HEENT: Conjunctivae and lids unremarkable. Cardiovascular: Regular rhythm.  Lungs: Normal work of breathing. Neurologic: No focal deficits.   Lab Results  Component Value Date   CREATININE 0.86 04/08/2020   BUN 20 04/08/2020   NA 137 04/08/2020   K 4.0 04/08/2020   CL 101 04/08/2020   CO2 21 04/08/2020   Lab Results  Component Value Date   ALT 58 (H) 04/08/2020   AST 41 (H) 04/08/2020   ALKPHOS 93 04/08/2020   BILITOT 0.8 04/08/2020   Lab Results  Component Value Date   HGBA1C 6.2 (H) 04/08/2020   HGBA1C 7.0 (H) 08/22/2019   HGBA1C 6.7 (H) 04/19/2019   Lab Results  Component Value Date   INSULIN 43.2 (H) 04/08/2020   INSULIN 33.6 (H) 08/22/2019   INSULIN 40.0 (H) 04/19/2019   INSULIN 40.9 (H) 04/12/2018   INSULIN 38.2 (H) 12/08/2017   Lab Results  Component Value Date   TSH 2.550 12/08/2017   Lab Results  Component Value Date   CHOL 227 (H) 04/08/2020   HDL 42 04/08/2020   LDLCALC 138 (H) 04/08/2020   TRIG 259 (H) 04/08/2020   CHOLHDL 5.4 (H) 04/08/2020   Lab Results  Component Value Date   WBC 7.6 09/05/2019   HGB 15.0 09/05/2019   HCT 47.0 (H) 09/05/2019   MCV 85.0 09/05/2019   PLT 228 09/05/2019   No results found for: IRON, TIBC, FERRITIN  Obesity Behavioral Intervention:   Approximately 15 minutes were spent on the discussion below.  ASK: We discussed the diagnosis of obesity with Rhonda Aguilar today and Rhonda Aguilar agreed to give [name] permission to discuss obesity behavioral modification therapy today.  ASSESS: Rhonda Aguilar has the diagnosis of obesity and her BMI today is 42.61. Rhonda Aguilar is in the action stage of change.    ADVISE: Rhonda Aguilar was educated on the multiple health risks of obesity as well as the benefit of weight loss to improve her health. She was advised of the need for long term treatment and the importance of lifestyle modifications to improve her current health and to decrease her risk of future health problems.  AGREE: Multiple dietary modification options and treatment options were discussed and Rhonda Aguilar agreed to follow the recommendations documented in the above note.  ARRANGE: Rhonda Aguilar was educated on the importance of frequent visits to treat obesity as outlined per CMS and USPSTF guidelines and agreed to schedule her next follow up appointment today.  Attestation Statements:   Reviewed by clinician on day of visit: allergies, medications, problem list, medical history, surgical history, family history, social history, and previous encounter notes.   [name], am acting as transcriptionist for [employer], PA-C.  I have reviewed the above documentation for accuracy and completeness, and I agree with the above. [name], PA-C

## 2020-04-14 MED FILL — DEXILANT DR 60 MG CAPSULE: 60 | 30 days supply | Qty: 30 | Fill #4

## 2020-04-25 DIAGNOSIS — Z7689 Persons encountering health services in other specified circumstances: Secondary | ICD-10-CM | POA: Diagnosis not present

## 2020-04-25 DIAGNOSIS — Z87410 Personal history of cervical dysplasia: Secondary | ICD-10-CM | POA: Diagnosis not present

## 2020-04-25 DIAGNOSIS — Z01419 Encounter for gynecological examination (general) (routine) without abnormal findings: Secondary | ICD-10-CM | POA: Diagnosis not present

## 2020-04-25 DIAGNOSIS — Z8742 Personal history of other diseases of the female genital tract: Secondary | ICD-10-CM | POA: Diagnosis not present

## 2020-04-25 DIAGNOSIS — M8580 Other specified disorders of bone density and structure, unspecified site: Secondary | ICD-10-CM | POA: Diagnosis not present

## 2020-04-25 DIAGNOSIS — N898 Other specified noninflammatory disorders of vagina: Secondary | ICD-10-CM | POA: Diagnosis not present

## 2020-04-28 ENCOUNTER — Other Ambulatory Visit (HOSPITAL_COMMUNITY): Payer: Self-pay | Admitting: Physician Assistant

## 2020-04-28 ENCOUNTER — Ambulatory Visit (INDEPENDENT_AMBULATORY_CARE_PROVIDER_SITE_OTHER): Payer: Medicare HMO | Admitting: Physician Assistant

## 2020-04-28 ENCOUNTER — Other Ambulatory Visit: Payer: Self-pay

## 2020-04-28 ENCOUNTER — Encounter (INDEPENDENT_AMBULATORY_CARE_PROVIDER_SITE_OTHER): Payer: Self-pay | Admitting: Physician Assistant

## 2020-04-28 VITALS — BP 135/85 | HR 75 | Temp 97.9°F | Ht 62.0 in | Wt 232.0 lb

## 2020-04-28 DIAGNOSIS — E1169 Type 2 diabetes mellitus with other specified complication: Secondary | ICD-10-CM | POA: Diagnosis not present

## 2020-04-28 DIAGNOSIS — Z6841 Body Mass Index (BMI) 40.0 and over, adult: Secondary | ICD-10-CM

## 2020-04-28 DIAGNOSIS — E559 Vitamin D deficiency, unspecified: Secondary | ICD-10-CM | POA: Diagnosis not present

## 2020-04-28 DIAGNOSIS — E785 Hyperlipidemia, unspecified: Secondary | ICD-10-CM

## 2020-04-28 MED ORDER — VITAMIN D (ERGOCALCIFEROL) 1.25 MG (50000 UNIT) PO CAPS: 50000.0000 [IU] | ORAL_CAPSULE | ORAL | 0 refills | Status: DC

## 2020-04-28 MED ORDER — OZEMPIC (1 MG/DOSE) 2 MG/1.5ML ~~LOC~~ SOPN: 1.0000 mg | PEN_INJECTOR | SUBCUTANEOUS | 0 refills | Status: DC

## 2020-04-29 ENCOUNTER — Telehealth (INDEPENDENT_AMBULATORY_CARE_PROVIDER_SITE_OTHER): Payer: Self-pay

## 2020-04-29 NOTE — Telephone Encounter (Signed)
[facility] called they are needing clarification on the instructions on the pts dose. They a questioning if the pt needs to take once a week or twice a weekly. Please advise

## 2020-04-29 NOTE — Progress Notes (Signed)
Chief Complaint:   OBESITY Rhonda Aguilar is here to discuss her progress with her obesity treatment plan along with follow-up of her obesity related diagnoses. Rhonda Aguilar is on the Category 2 Plan + 100 calories and states she is following her eating plan approximately 30% of the time. Rhonda Aguilar states she is doing 0 minutes 0 times per week.  Today's visit was #: 59 Starting weight: 231 lbs Starting date: 12/08/2017 Today's weight: 232 lbs Today's date: 04/28/2020 Total lbs lost to date: 0 Total lbs lost since last in-office visit: 1  Interim History: Rhonda Aguilar states that she is not getting enough protein. She is not weighing her protein. She continues to have an aversion to some meats. Labs were reviewed today. She is to speak with her primary care physician regarding her lipids.  Subjective:   1. Vitamin D deficiency Rhonda Aguilar is on Vit D, and her last Vit D level was not at goal. She is on Vit D twice weekly.  2. Type 2 diabetes mellitus with hyperlipidemia (Rhonda Aguilar) Rhonda Aguilar is on Jardiance and Ozempic 0.5 mg, and she is tolerating it well. Last A1c was 6.2.  Assessment/Plan:   1. Vitamin D deficiency Low Vitamin D level contributes to fatigue and are associated with obesity, breast, and colon cancer. Rhonda Aguilar agreed to change to prescription Vitamin D 50,000 IU once weekly with no refills. She will follow-up for routine testing of Vitamin D, at least 2-3 times per year to avoid over-replacement.  - Vitamin D, Ergocalciferol, (DRISDOL) 1.25 MG (50000 UNIT) CAPS capsule; Take 1 capsule (50,000 Units total) by mouth once a week. TAKE 1 CAPSULE BY MOUTH (TWICE) WEEKLY  Dispense: 4 capsule; Refill: 0  2. Type 2 diabetes mellitus with hyperlipidemia (Rhonda Aguilar) Good blood sugar control is important to decrease the likelihood of diabetic complications such as nephropathy, neuropathy, limb loss, blindness, coronary artery disease, and death. Intensive lifestyle modification including diet, exercise and weight loss are  the first line of treatment for diabetes. Rhonda Aguilar agreed to increase Ozempic to 1 mg weekly with no refills.  - Semaglutide, 1 MG/DOSE, (OZEMPIC, 1 MG/DOSE,) 2 MG/1.5ML SOPN; Inject 1 mg into the skin once a week.  Dispense: 6 mL; Refill: 0  3. Class 3 severe obesity with serious comorbidity and body mass index (BMI) of 40.0 to 44.9 in adult, unspecified obesity type Rhonda Aguilar) Rhonda Aguilar is currently in the action stage of change. As such, her goal is to continue with weight loss efforts. She has agreed to the Category 2 Plan.   Exercise goals: No exercise has been prescribed at this time.  Behavioral modification strategies: increasing lean protein intake, decreasing simple carbohydrates and no skipping meals.  Rhonda Aguilar has agreed to follow-up with our clinic in 3 weeks. She was informed of the importance of frequent follow-up visits to maximize her success with intensive lifestyle modifications for her multiple health conditions.   Objective:   Blood pressure 135/85, pulse 75, temperature 97.9 F (36.6 C), height 5\' 2"  (1.575 m), weight 232 lb (105.2 kg), SpO2 95 %. Body mass index is 42.43 kg/m.  General: Cooperative, alert, well developed, in no acute distress. HEENT: Conjunctivae and lids unremarkable. Cardiovascular: Regular rhythm.  Lungs: Normal work of breathing. Neurologic: No focal deficits.   Lab Results  Component Value Date   CREATININE 0.86 04/08/2020   BUN 20 04/08/2020   NA 137 04/08/2020   K 4.0 04/08/2020   CL 101 04/08/2020   CO2 21 04/08/2020   Lab Results  Component Value  Date   ALT 58 (H) 04/08/2020   AST 41 (H) 04/08/2020   ALKPHOS 93 04/08/2020   BILITOT 0.8 04/08/2020   Lab Results  Component Value Date   HGBA1C 6.2 (H) 04/08/2020   HGBA1C 7.0 (H) 08/22/2019   HGBA1C 6.7 (H) 04/19/2019   Lab Results  Component Value Date   INSULIN 43.2 (H) 04/08/2020   INSULIN 33.6 (H) 08/22/2019   INSULIN 40.0 (H) 04/19/2019   INSULIN 40.9 (H) 04/12/2018   INSULIN  38.2 (H) 12/08/2017   Lab Results  Component Value Date   TSH 2.550 12/08/2017   Lab Results  Component Value Date   CHOL 227 (H) 04/08/2020   HDL 42 04/08/2020   LDLCALC 138 (H) 04/08/2020   TRIG 259 (H) 04/08/2020   CHOLHDL 5.4 (H) 04/08/2020   Lab Results  Component Value Date   WBC 7.6 09/05/2019   HGB 15.0 09/05/2019   HCT 47.0 (H) 09/05/2019   MCV 85.0 09/05/2019   PLT 228 09/05/2019   No results found for: IRON, TIBC, FERRITIN  Obesity Behavioral Intervention:   Approximately 15 minutes were spent on the discussion below.  ASK: We discussed the diagnosis of obesity with Rhonda Aguilar today and Rhonda Aguilar agreed to give [name] permission to discuss obesity behavioral modification therapy today.  ASSESS: Rhonda Aguilar has the diagnosis of obesity and her BMI today is 42.42. Rhonda Aguilar is in the action stage of change.   ADVISE: Rhonda Aguilar was educated on the multiple health risks of obesity as well as the benefit of weight loss to improve her health. She was advised of the need for long term treatment and the importance of lifestyle modifications to improve her current health and to decrease her risk of future health problems.  AGREE: Multiple dietary modification options and treatment options were discussed and Rhonda Aguilar agreed to follow the recommendations documented in the above note.  ARRANGE: Rhonda Aguilar was educated on the importance of frequent visits to treat obesity as outlined per CMS and USPSTF guidelines and agreed to schedule her next follow up appointment today.  Attestation Statements:   Reviewed by clinician on day of visit: allergies, medications, problem list, medical history, surgical history, family history, social history, and previous encounter notes.   [name], am acting as transcriptionist for [employer], PA-C.  I have reviewed the above documentation for accuracy and completeness, and I agree with the above. [name], PA-C

## 2020-05-05 NOTE — Telephone Encounter (Signed)
Spoke with pharmacy and instructed them to fill the medication once a week. April, [place]

## 2020-05-13 ENCOUNTER — Other Ambulatory Visit (HOSPITAL_COMMUNITY): Payer: Self-pay | Admitting: Family Medicine

## 2020-05-16 MED FILL — DEXILANT DR 60 MG CAPSULE: 60 | 30 days supply | Qty: 30 | Fill #5

## 2020-05-19 ENCOUNTER — Encounter (INDEPENDENT_AMBULATORY_CARE_PROVIDER_SITE_OTHER): Payer: Self-pay | Admitting: Physician Assistant

## 2020-05-19 ENCOUNTER — Ambulatory Visit (INDEPENDENT_AMBULATORY_CARE_PROVIDER_SITE_OTHER): Payer: Medicare HMO | Admitting: Physician Assistant

## 2020-05-19 ENCOUNTER — Other Ambulatory Visit: Payer: Self-pay

## 2020-05-19 VITALS — BP 140/75 | HR 86 | Temp 97.6°F | Ht 62.0 in | Wt 231.0 lb

## 2020-05-19 DIAGNOSIS — E785 Hyperlipidemia, unspecified: Secondary | ICD-10-CM

## 2020-05-19 DIAGNOSIS — E7849 Other hyperlipidemia: Secondary | ICD-10-CM

## 2020-05-19 DIAGNOSIS — Z6841 Body Mass Index (BMI) 40.0 and over, adult: Secondary | ICD-10-CM

## 2020-05-19 DIAGNOSIS — E1169 Type 2 diabetes mellitus with other specified complication: Secondary | ICD-10-CM | POA: Diagnosis not present

## 2020-05-21 NOTE — Progress Notes (Signed)
Chief Complaint:   OBESITY Nicolena is here to discuss her progress with her obesity treatment plan along with follow-up of her obesity related diagnoses. Gerene is on the Category 2 Plan and states she is following her eating plan approximately 20% of the time. Jenay states she is doing pilates for 60 minutes 1 time per week.  Today's visit was #: 11 Starting weight: 231 lbs Starting date: 12/08/2017 Today's weight: 231 lbs Today's date: 05/19/2020 Total lbs lost to date: 0 Total lbs lost since last in-office visit: 1  Interim History: Jeyli had a GI virus after her last visit. She drank juices for 2 days and has progressed her diet back to normal slowly. She walked quite a bit at the beach recently and she feels it improved her energy.  Subjective:   1. Type 2 diabetes mellitus with hyperlipidemia ([name]) Kathe is on Ozempic 0.5 mg weekly, and she is tolerating it well. Last A1c was 4.2. She denies hypoglycemia. She is exercising 1 day per week.  2. Other hyperlipidemia Giovannina is on Crestor, and she denies myalgias or chest pain.  Assessment/Plan:   1. Type 2 diabetes mellitus with hyperlipidemia ([name]) Good blood sugar control is important to decrease the likelihood of diabetic complications such as nephropathy, neuropathy, limb loss, blindness, coronary artery disease, and death. Intensive lifestyle modification including diet, exercise and weight loss are the first line of treatment for diabetes. Tonilynn will continue with her meal plan, and she will increase her exercise.  2. Other hyperlipidemia Cardiovascular risk and specific lipid/LDL goals reviewed. We discussed several lifestyle modifications today. Lashena will continue her medications and meal plan, and will continue to work on exercise and weight loss efforts. Orders and follow up as documented in patient record.   Counseling Intensive lifestyle modifications are the first line treatment for this issue. . Dietary changes:  Increase soluble fiber. Decrease simple carbohydrates. . Exercise changes: Moderate to vigorous-intensity aerobic activity 150 minutes per week if tolerated. . Lipid-lowering medications: see documented in medical record.  3. Class 3 severe obesity with serious comorbidity and body mass index (BMI) of 40.0 to 44.9 in adult, unspecified obesity type [facility]) Britne is currently in the action stage of change. As such, her goal is to continue with weight loss efforts. She has agreed to the Category 2 Plan + 100 protein calories.   Exercise goals: As is.  Behavioral modification strategies: increasing lean protein intake, decreasing simple carbohydrates and meal planning and cooking strategies.  Avital has agreed to follow-up with our clinic in 3 weeks. She was informed of the importance of frequent follow-up visits to maximize her success with intensive lifestyle modifications for her multiple health conditions.   Objective:   Blood pressure 140/75, pulse 86, temperature 97.6 F (36.4 C), height 5\' 2"  (1.575 m), weight 231 lb (104.8 kg), SpO2 95 %. Body mass index is 42.25 kg/m.  General: Cooperative, alert, well developed, in no acute distress. HEENT: Conjunctivae and lids unremarkable. Cardiovascular: Regular rhythm.  Lungs: Normal work of breathing. Neurologic: No focal deficits.   Lab Results  Component Value Date   CREATININE 0.86 04/08/2020   BUN 20 04/08/2020   NA 137 04/08/2020   K 4.0 04/08/2020   CL 101 04/08/2020   CO2 21 04/08/2020   Lab Results  Component Value Date   ALT 58 (H) 04/08/2020   AST 41 (H) 04/08/2020   ALKPHOS 93 04/08/2020   BILITOT 0.8 04/08/2020   Lab Results  Component  Value Date   HGBA1C 6.2 (H) 04/08/2020   HGBA1C 7.0 (H) 08/22/2019   HGBA1C 6.7 (H) 04/19/2019   Lab Results  Component Value Date   INSULIN 43.2 (H) 04/08/2020   INSULIN 33.6 (H) 08/22/2019   INSULIN 40.0 (H) 04/19/2019   INSULIN 40.9 (H) 04/12/2018   INSULIN 38.2 (H)  12/08/2017   Lab Results  Component Value Date   TSH 2.550 12/08/2017   Lab Results  Component Value Date   CHOL 227 (H) 04/08/2020   HDL 42 04/08/2020   LDLCALC 138 (H) 04/08/2020   TRIG 259 (H) 04/08/2020   CHOLHDL 5.4 (H) 04/08/2020   Lab Results  Component Value Date   WBC 7.6 09/05/2019   HGB 15.0 09/05/2019   HCT 47.0 (H) 09/05/2019   MCV 85.0 09/05/2019   PLT 228 09/05/2019   No results found for: IRON, TIBC, FERRITIN  Obesity Behavioral Intervention:   Approximately 15 minutes were spent on the discussion below.  ASK: We discussed the diagnosis of obesity with Chablis today and Jalin agreed to give [name] permission to discuss obesity behavioral modification therapy today.  ASSESS: Sidrah has the diagnosis of obesity and her BMI today is 42.24. Jataya is in the action stage of change.   ADVISE: Jannetta was educated on the multiple health risks of obesity as well as the benefit of weight loss to improve her health. She was advised of the need for long term treatment and the importance of lifestyle modifications to improve her current health and to decrease her risk of future health problems.  AGREE: Multiple dietary modification options and treatment options were discussed and Bridgitte agreed to follow the recommendations documented in the above note.  ARRANGE: Leocadia was educated on the importance of frequent visits to treat obesity as outlined per CMS and USPSTF guidelines and agreed to schedule her next follow up appointment today.  Attestation Statements:   Reviewed by clinician on day of visit: allergies, medications, problem list, medical history, surgical history, family history, social history, and previous encounter notes.   [name], am acting as transcriptionist for [employer], PA-C.  I have reviewed the above documentation for accuracy and completeness, and I agree with the above. [name], PA-C

## 2020-05-27 ENCOUNTER — Other Ambulatory Visit (HOSPITAL_COMMUNITY): Payer: Self-pay

## 2020-05-27 MED FILL — Verapamil HCl Cap ER 24HR 300 MG: ORAL | 85 days supply | Qty: 85 | Fill #0 | Status: AC

## 2020-05-27 MED FILL — Verapamil HCl Cap ER 24HR 300 MG: ORAL | 5 days supply | Qty: 5 | Fill #0 | Status: AC

## 2020-05-28 ENCOUNTER — Other Ambulatory Visit (HOSPITAL_COMMUNITY): Payer: Self-pay

## 2020-05-29 ENCOUNTER — Other Ambulatory Visit (HOSPITAL_COMMUNITY): Payer: Self-pay

## 2020-06-04 ENCOUNTER — Other Ambulatory Visit (HOSPITAL_COMMUNITY): Payer: Self-pay

## 2020-06-04 MED FILL — Semaglutide Soln Pen-inj 1 MG/DOSE (4 MG/3ML): SUBCUTANEOUS | 28 days supply | Qty: 3 | Fill #0 | Status: AC

## 2020-06-04 MED FILL — Semaglutide Soln Pen-inj 1 MG/DOSE (4 MG/3ML): SUBCUTANEOUS | 28 days supply | Qty: 2 | Fill #0 | Status: CN

## 2020-06-04 MED FILL — Buspirone HCl Tab 5 MG: ORAL | 90 days supply | Qty: 90 | Fill #0 | Status: AC

## 2020-06-05 ENCOUNTER — Other Ambulatory Visit (HOSPITAL_COMMUNITY): Payer: Self-pay

## 2020-06-11 ENCOUNTER — Ambulatory Visit: Payer: 59 | Admitting: Adult Health

## 2020-06-11 DIAGNOSIS — G4733 Obstructive sleep apnea (adult) (pediatric): Secondary | ICD-10-CM | POA: Diagnosis not present

## 2020-06-11 DIAGNOSIS — G4730 Sleep apnea, unspecified: Secondary | ICD-10-CM | POA: Diagnosis not present

## 2020-06-12 ENCOUNTER — Telehealth: Payer: Self-pay | Admitting: Pharmacist

## 2020-06-12 DIAGNOSIS — Z789 Other specified health status: Secondary | ICD-10-CM

## 2020-06-12 NOTE — Progress Notes (Addendum)
[facility])                                            [place] Team                                        Statin Quality Measure Assessment    06/12/2020  Rhonda Aguilar 1956/02/18 [id number]   Per review of chart and payor information, patient has a diagnosis of diabetes but is not currently filling a statin prescription.  This places patient into the SUPD (Statin Use In Patients with Diabetes) measure for CMS.    Patient had Rosuvastatin 5 mg filled 11/21 but not since that time (according to [employer] fill history data).There is documentation of statin intolerance.    If deemed therapeutically appropriate, please consider associating a statin exclusion code with your upcoming visit on 05/17/2022if the patient is statin intolerant or an alternative statin dosing schedule.  The 10-year ASCVD risk score [name] [name]., et al., 2013) is: 21.1%   Values used to calculate the score:     Age: 65 years     Sex: Female     Is Non-Hispanic African American: No     Diabetic: Yes     Tobacco smoker: No     Systolic Blood Pressure: 709 mmHg     Is BP treated: Yes     HDL Cholesterol: 42 mg/dL     Total Cholesterol: 227 mg/dL 04/08/2020     Component Value Date/Time   CHOL 227 (H) 04/08/2020 1012   TRIG 259 (H) 04/08/2020 1012   HDL 42 04/08/2020 1012   CHOLHDL 5.4 (H) 04/08/2020 1012   LDLCALC 138 (H) 04/08/2020 1012    Please consider ONE of the following recommendations:  ? Initiate high intensity statin Atorvastatin 40mg  once daily, #90, 3 refills   Rosuvastatin 20mg  once daily, #90, 3 refills    ? Initiate moderate intensity          statin with reduced frequency if prior          statin intolerance 1x weekly, #13, 3 refills   2x weekly, #26, 3 refills   3x weekly, #39, 3 refills    ? Code for past statin intolerance or  other exclusions (required annually)  Provider Requirements: ? Associate code  during an office visit or telehealth encounter  Drug Induced Myopathy G72.0   Myopathy, unspecified G72.9   Myositis, unspecified M60.9   Rhabdomyolysis G28.36   Alcoholic fatty liver O29.4   Cirrhosis of liver K74.69   Prediabetes R73.03   PCOS E28.2   Toxic liver disease, unspecified K71.9   Adverse effect of antihyperlipidemic and antiarteriosclerotic drugs, initial encounter Clifford, PharmD, [place] Clinical Pharmacist [phone]

## 2020-06-16 ENCOUNTER — Ambulatory Visit (INDEPENDENT_AMBULATORY_CARE_PROVIDER_SITE_OTHER): Payer: Medicare HMO | Admitting: Physician Assistant

## 2020-06-16 ENCOUNTER — Other Ambulatory Visit (HOSPITAL_COMMUNITY): Payer: Self-pay

## 2020-06-16 ENCOUNTER — Other Ambulatory Visit: Payer: Self-pay

## 2020-06-16 VITALS — BP 140/82 | HR 82 | Temp 97.8°F | Ht 62.0 in | Wt 229.0 lb

## 2020-06-16 DIAGNOSIS — E559 Vitamin D deficiency, unspecified: Secondary | ICD-10-CM

## 2020-06-16 DIAGNOSIS — E785 Hyperlipidemia, unspecified: Secondary | ICD-10-CM

## 2020-06-16 DIAGNOSIS — Z6841 Body Mass Index (BMI) 40.0 and over, adult: Secondary | ICD-10-CM

## 2020-06-16 DIAGNOSIS — E1169 Type 2 diabetes mellitus with other specified complication: Secondary | ICD-10-CM | POA: Diagnosis not present

## 2020-06-16 MED ORDER — VITAMIN D (ERGOCALCIFEROL) 1.25 MG (50000 UNIT) PO CAPS
ORAL_CAPSULE | ORAL | 0 refills | Status: DC
  Filled 2020-06-16: qty 4, 28d supply, fill #0

## 2020-06-16 MED FILL — Dexlansoprazole Cap Delayed Release 60 MG: ORAL | 30 days supply | Qty: 30 | Fill #0 | Status: AC

## 2020-06-17 ENCOUNTER — Other Ambulatory Visit (HOSPITAL_COMMUNITY): Payer: Self-pay

## 2020-06-17 MED ORDER — VALSARTAN 320 MG PO TABS
ORAL_TABLET | ORAL | 0 refills | Status: DC
  Filled 2020-06-17: qty 90, 90d supply, fill #0

## 2020-06-17 NOTE — Progress Notes (Signed)
Chief Complaint:   OBESITY Rhonda Aguilar is here to discuss her progress with her obesity treatment plan along with follow-up of her obesity related diagnoses. Glorianna is on the Category 2 Plan + 100 calories and states she is following her eating plan approximately 30% of the time. Keyasia states she is not currently exercising.  Today's visit was #: 31 Starting weight: 231 lbs Starting date: 12/08/2017 Today's weight: 229 lbs Today's date: 06/16/2020 Total lbs lost to date: 2 Total lbs lost since last in-office visit: 2  Interim History: Medora reports having a stent of upset stomach with diarrhea after having 2 high fat meals in a day. She is not getting enough protein during the day.   Subjective:   1. Vitamin D deficiency Shyteria denies nausea, vomiting, and muscle weakness. Pt is on prescription Vit D weekly.  2. Type 2 diabetes mellitus with hyperlipidemia ([place]) Tamerra's last A1c was 6.2. She is on Ozempic. Pt denies polyphagia.  Assessment/Plan:   1. Vitamin D deficiency Low Vitamin D level contributes to fatigue and are associated with obesity, breast, and colon cancer. She agrees to continue to take prescription Vitamin D @50 ,000 IU every week and will follow-up for routine testing of Vitamin D, at least 2-3 times per year to avoid over-replacement.  - Vitamin D, Ergocalciferol, (DRISDOL) 1.25 MG (50000 UNIT) CAPS capsule; TAKE 1 CAPSULE BY MOUTH ONCE A WEEK.  Dispense: 4 capsule; Refill: 0  2. Type 2 diabetes mellitus with hyperlipidemia ([name]) Good blood sugar control is important to decrease the likelihood of diabetic complications such as nephropathy, neuropathy, limb loss, blindness, coronary artery disease, and death. Intensive lifestyle modification including diet, exercise and weight loss are the first line of treatment for diabetes. Continue with Ozempic.  3. Class 3 severe obesity with serious comorbidity and body mass index (BMI) of 40.0 to 44.9 in adult, unspecified  obesity type [facility]) Audria is currently in the action stage of change. As such, her goal is to continue with weight loss efforts. She has agreed to the Category 2 Plan + 100 calories.   Exercise goals: As is  Behavioral modification strategies: increasing lean protein intake, decreasing simple carbohydrates and meal planning and cooking strategies.  Omie has agreed to follow-up with our clinic in 3 weeks. She was informed of the importance of frequent follow-up visits to maximize her success with intensive lifestyle modifications for her multiple health conditions.   Objective:   Blood pressure 140/82, pulse 82, temperature 97.8 F (36.6 C), height 5\' 2"  (1.575 m), weight 229 lb (103.9 kg). Body mass index is 41.88 kg/m.  General: Cooperative, alert, well developed, in no acute distress. HEENT: Conjunctivae and lids unremarkable. Cardiovascular: Regular rhythm.  Lungs: Normal work of breathing. Neurologic: No focal deficits.   Lab Results  Component Value Date   CREATININE 0.86 04/08/2020   BUN 20 04/08/2020   NA 137 04/08/2020   K 4.0 04/08/2020   CL 101 04/08/2020   CO2 21 04/08/2020   Lab Results  Component Value Date   ALT 58 (H) 04/08/2020   AST 41 (H) 04/08/2020   ALKPHOS 93 04/08/2020   BILITOT 0.8 04/08/2020   Lab Results  Component Value Date   HGBA1C 6.2 (H) 04/08/2020   HGBA1C 7.0 (H) 08/22/2019   HGBA1C 6.7 (H) 04/19/2019   Lab Results  Component Value Date   INSULIN 43.2 (H) 04/08/2020   INSULIN 33.6 (H) 08/22/2019   INSULIN 40.0 (H) 04/19/2019   INSULIN 40.9 (H)  04/12/2018   INSULIN 38.2 (H) 12/08/2017   Lab Results  Component Value Date   TSH 2.550 12/08/2017   Lab Results  Component Value Date   CHOL 227 (H) 04/08/2020   HDL 42 04/08/2020   LDLCALC 138 (H) 04/08/2020   TRIG 259 (H) 04/08/2020   CHOLHDL 5.4 (H) 04/08/2020   Lab Results  Component Value Date   WBC 7.6 09/05/2019   HGB 15.0 09/05/2019   HCT 47.0 (H) 09/05/2019   MCV  85.0 09/05/2019   PLT 228 09/05/2019   No results found for: IRON, TIBC, FERRITIN  Obesity Behavioral Intervention:   Approximately 15 minutes were spent on the discussion below.  ASK: We discussed the diagnosis of obesity with Kaytelynn today and Jahleah agreed to give [name] permission to discuss obesity behavioral modification therapy today.  ASSESS: Tameeka has the diagnosis of obesity and her BMI today is 41.9. Jackqulyn is in the action stage of change.   ADVISE: Jaina was educated on the multiple health risks of obesity as well as the benefit of weight loss to improve her health. She was advised of the need for long term treatment and the importance of lifestyle modifications to improve her current health and to decrease her risk of future health problems.  AGREE: Multiple dietary modification options and treatment options were discussed and Twila agreed to follow the recommendations documented in the above note.  ARRANGE: Dannetta was educated on the importance of frequent visits to treat obesity as outlined per CMS and USPSTF guidelines and agreed to schedule her next follow up appointment today.  Attestation Statements:   Reviewed by clinician on day of visit: allergies, medications, problem list, medical history, surgical history, family history, social history, and previous encounter notes.  Coral Ceo, am acting as Location manager for [employer], PA-C.  I have reviewed the above documentation for accuracy and completeness, and I agree with the above. [name], PA-C

## 2020-06-19 ENCOUNTER — Other Ambulatory Visit (HOSPITAL_COMMUNITY): Payer: Self-pay

## 2020-07-04 ENCOUNTER — Other Ambulatory Visit (HOSPITAL_COMMUNITY): Payer: Self-pay

## 2020-07-04 DIAGNOSIS — L900 Lichen sclerosus et atrophicus: Secondary | ICD-10-CM | POA: Diagnosis not present

## 2020-07-04 DIAGNOSIS — E1165 Type 2 diabetes mellitus with hyperglycemia: Secondary | ICD-10-CM | POA: Diagnosis not present

## 2020-07-04 DIAGNOSIS — E782 Mixed hyperlipidemia: Secondary | ICD-10-CM | POA: Diagnosis not present

## 2020-07-04 DIAGNOSIS — I10 Essential (primary) hypertension: Secondary | ICD-10-CM | POA: Diagnosis not present

## 2020-07-04 MED ORDER — CLOBETASOL PROPIONATE 0.05 % EX OINT
TOPICAL_OINTMENT | CUTANEOUS | 1 refills | Status: DC
  Filled 2020-07-04: qty 15, 10d supply, fill #0
  Filled 2020-07-17: qty 15, 7d supply, fill #0
  Filled 2020-11-22 – 2020-11-24 (×2): qty 15, 7d supply, fill #1

## 2020-07-08 ENCOUNTER — Ambulatory Visit (INDEPENDENT_AMBULATORY_CARE_PROVIDER_SITE_OTHER): Payer: Medicare HMO | Admitting: Physician Assistant

## 2020-07-09 ENCOUNTER — Other Ambulatory Visit (HOSPITAL_COMMUNITY): Payer: Self-pay

## 2020-07-11 ENCOUNTER — Other Ambulatory Visit (HOSPITAL_COMMUNITY): Payer: Self-pay

## 2020-07-12 ENCOUNTER — Other Ambulatory Visit (HOSPITAL_COMMUNITY): Payer: Self-pay

## 2020-07-14 ENCOUNTER — Encounter (INDEPENDENT_AMBULATORY_CARE_PROVIDER_SITE_OTHER): Payer: Self-pay | Admitting: Physician Assistant

## 2020-07-14 ENCOUNTER — Other Ambulatory Visit: Payer: Self-pay

## 2020-07-14 ENCOUNTER — Ambulatory Visit (INDEPENDENT_AMBULATORY_CARE_PROVIDER_SITE_OTHER): Payer: Medicare HMO | Admitting: Physician Assistant

## 2020-07-14 ENCOUNTER — Other Ambulatory Visit (HOSPITAL_COMMUNITY): Payer: Self-pay

## 2020-07-14 VITALS — BP 146/83 | HR 86 | Temp 98.2°F | Ht 62.0 in | Wt 231.0 lb

## 2020-07-14 DIAGNOSIS — I10 Essential (primary) hypertension: Secondary | ICD-10-CM | POA: Diagnosis not present

## 2020-07-14 DIAGNOSIS — Z6841 Body Mass Index (BMI) 40.0 and over, adult: Secondary | ICD-10-CM | POA: Diagnosis not present

## 2020-07-14 DIAGNOSIS — E559 Vitamin D deficiency, unspecified: Secondary | ICD-10-CM | POA: Diagnosis not present

## 2020-07-14 MED ORDER — VITAMIN D (ERGOCALCIFEROL) 1.25 MG (50000 UNIT) PO CAPS
ORAL_CAPSULE | ORAL | 0 refills | Status: DC
  Filled 2020-07-14: qty 4, 28d supply, fill #0

## 2020-07-15 NOTE — Progress Notes (Signed)
Chief Complaint:   OBESITY Rhonda Aguilar is here to discuss her progress with her obesity treatment plan along with follow-up of her obesity related diagnoses. Rhonda Aguilar is on the Category 2 Plan + 100 calories and states she is following her eating plan approximately 50% of the time. Rhonda Aguilar states she is walking, and doing pilates for 60 minutes 1 time per week.  Today's visit was #: 7 Starting weight: 231 lbs Starting date: 12/08/2017 Today's weight: 231 lbs Today's date: 07/14/2020 Total lbs lost to date: 0 Total lbs lost since last in-office visit: 0  Interim History: Rhonda Aguilar notes decreasing Ozempic to 0.5mg  has resolved her abdominal cramps, and she is ready to restart the plan. She does best when she is logging her food.  Subjective:   1. Vitamin D deficiency Rhonda Aguilar is on Vit D, and she denies nausea, vomiting, or muscle weakness.  2. Essential hypertension Rhonda Aguilar's blood pressure is slightly elevated today, and she is asymptomatic.  Assessment/Plan:   1. Vitamin D deficiency Low Vitamin D level contributes to fatigue and are associated with obesity, breast, and colon cancer. We will refill prescription Vitamin D for 1 month. Rhonda Aguilar will follow-up for routine testing of Vitamin D, at least 2-3 times per year to avoid over-replacement.  - Vitamin D, Ergocalciferol, (DRISDOL) 1.25 MG (50000 UNIT) CAPS capsule; TAKE 1 CAPSULE BY MOUTH ONCE A WEEK.  Dispense: 4 capsule; Refill: 0  2. Essential hypertension Rhonda Aguilar will continue with her medications and monitor her blood pressure. She will continue working on healthy weight loss and exercise to improve blood pressure control. We will watch for signs of hypotension as she continues her lifestyle modifications.  3. Class 3 severe obesity with serious comorbidity and body mass index (BMI) of 40.0 to 44.9 in adult, unspecified obesity type [facility]) Rhonda Aguilar is currently in the action stage of change. As such, her goal is to continue with weight loss  efforts. She has agreed to the Category 2 Plan + 100 calories.   Exercise goals: As is.  Behavioral modification strategies: decreasing simple carbohydrates and no skipping meals.  Rhonda Aguilar has agreed to follow-up with our clinic in 4 weeks. She was informed of the importance of frequent follow-up visits to maximize her success with intensive lifestyle modifications for her multiple health conditions.   Objective:   Blood pressure (!) 146/83, pulse 86, temperature 98.2 F (36.8 C), height 5\' 2"  (1.575 m), weight 231 lb (104.8 kg), SpO2 95 %. Body mass index is 42.25 kg/m.  General: Cooperative, alert, well developed, in no acute distress. HEENT: Conjunctivae and lids unremarkable. Cardiovascular: Regular rhythm.  Lungs: Normal work of breathing. Neurologic: No focal deficits.   Lab Results  Component Value Date   CREATININE 0.86 04/08/2020   BUN 20 04/08/2020   NA 137 04/08/2020   K 4.0 04/08/2020   CL 101 04/08/2020   CO2 21 04/08/2020   Lab Results  Component Value Date   ALT 58 (H) 04/08/2020   AST 41 (H) 04/08/2020   ALKPHOS 93 04/08/2020   BILITOT 0.8 04/08/2020   Lab Results  Component Value Date   HGBA1C 6.2 (H) 04/08/2020   HGBA1C 7.0 (H) 08/22/2019   HGBA1C 6.7 (H) 04/19/2019   Lab Results  Component Value Date   INSULIN 43.2 (H) 04/08/2020   INSULIN 33.6 (H) 08/22/2019   INSULIN 40.0 (H) 04/19/2019   INSULIN 40.9 (H) 04/12/2018   INSULIN 38.2 (H) 12/08/2017   Lab Results  Component Value Date  TSH 2.550 12/08/2017   Lab Results  Component Value Date   CHOL 227 (H) 04/08/2020   HDL 42 04/08/2020   LDLCALC 138 (H) 04/08/2020   TRIG 259 (H) 04/08/2020   CHOLHDL 5.4 (H) 04/08/2020   Lab Results  Component Value Date   WBC 7.6 09/05/2019   HGB 15.0 09/05/2019   HCT 47.0 (H) 09/05/2019   MCV 85.0 09/05/2019   PLT 228 09/05/2019   No results found for: IRON, TIBC, FERRITIN  Obesity Behavioral Intervention:   Approximately 15 minutes were  spent on the discussion below.  ASK: We discussed the diagnosis of obesity with Rhonda Aguilar today and Rhonda Aguilar agreed to give [name] permission to discuss obesity behavioral modification therapy today.  ASSESS: Rhonda Aguilar has the diagnosis of obesity and her BMI today is 42.24. Rhonda Aguilar is in the action stage of change.   ADVISE: Rhonda Aguilar was educated on the multiple health risks of obesity as well as the benefit of weight loss to improve her health. She was advised of the need for long term treatment and the importance of lifestyle modifications to improve her current health and to decrease her risk of future health problems.  AGREE: Multiple dietary modification options and treatment options were discussed and Rhonda Aguilar agreed to follow the recommendations documented in the above note.  ARRANGE: Rhonda Aguilar was educated on the importance of frequent visits to treat obesity as outlined per CMS and USPSTF guidelines and agreed to schedule her next follow up appointment today.  Attestation Statements:   Reviewed by clinician on day of visit: allergies, medications, problem list, medical history, surgical history, family history, social history, and previous encounter notes.   Rhonda Aguilar, am acting as transcriptionist for [employer], PA-C.  I have reviewed the above documentation for accuracy and completeness, and I agree with the above. [name], PA-C

## 2020-07-17 ENCOUNTER — Other Ambulatory Visit (HOSPITAL_COMMUNITY): Payer: Self-pay

## 2020-07-17 MED FILL — Semaglutide Soln Pen-inj 1 MG/DOSE (4 MG/3ML): SUBCUTANEOUS | 28 days supply | Qty: 3 | Fill #0 | Status: AC

## 2020-07-17 MED FILL — Dexlansoprazole Cap Delayed Release 60 MG: ORAL | 30 days supply | Qty: 30 | Fill #1 | Status: AC

## 2020-07-18 ENCOUNTER — Other Ambulatory Visit (HOSPITAL_COMMUNITY): Payer: Self-pay

## 2020-07-22 ENCOUNTER — Other Ambulatory Visit (HOSPITAL_COMMUNITY): Payer: Self-pay

## 2020-08-07 ENCOUNTER — Telehealth: Payer: Self-pay | Admitting: Pharmacist

## 2020-08-07 DIAGNOSIS — Z79899 Other long term (current) drug therapy: Secondary | ICD-10-CM

## 2020-08-07 NOTE — Progress Notes (Signed)
[facility])                                            [place] Team                                        Statin Quality Measure Assessment    08/07/2020  Rhonda Aguilar 1955-10-31 [id number]   Per review of chart and payor information, patient has a diagnosis of diabetes but is not currently filling a statin prescription.  This places patient into the SUPD (Statin Use In Patients with Diabetes) measure for CMS.    Patient has Rosuvastatin 5mg  on her medication list but it has not been filled this year because her dose was decreased to three times weekly.   If deemed therapeutically appropriate, please send a new prescription with the patient's current dose to her pharmacy during her office visit on 08/11/20  The 10-year ASCVD risk score [name] [name]., et al., 2013) is: 22.7%   Values used to calculate the score:     Age: 65 years     Sex: Female     Is Non-Hispanic African American: No     Diabetic: Yes     Tobacco smoker: No     Systolic Blood Pressure: 992 mmHg     Is BP treated: Yes     HDL Cholesterol: 42 mg/dL     Total Cholesterol: 227 mg/dL 04/08/2020     Component Value Date/Time   CHOL 227 (H) 04/08/2020 1012   TRIG 259 (H) 04/08/2020 1012   HDL 42 04/08/2020 1012   CHOLHDL 5.4 (H) 04/08/2020 1012   LDLCALC 138 (H) 04/08/2020 1012    Please consider ONE of the following recommendations:  Initiate high intensity statin Atorvastatin 40mg  once daily, #90, 3 refills   Rosuvastatin 20mg  once daily, #90, 3 refills    Initiate moderate intensity          statin with reduced frequency if prior          statin intolerance 1x weekly, #13, 3 refills   2x weekly, #26, 3 refills   3x weekly, #39, 3 refills    Code for past statin intolerance or  other exclusions (required annually)  Provider Requirements: Associate code during an office visit or telehealth encounter  Drug Induced Myopathy G72.0    Myopathy, unspecified G72.9   Myositis, unspecified M60.9   Rhabdomyolysis E26.83   Alcoholic fatty liver M19.6   Cirrhosis of liver K74.69   Prediabetes R73.03   PCOS E28.2   Toxic liver disease, unspecified K71.9   Adverse effect of antihyperlipidemic and antiarteriosclerotic drugs, initial encounter T46.6X5A    Please let us know your decision.    Thank you!   Elayne Guerin, PharmD, [place] Clinical Pharmacist [phone]

## 2020-08-11 ENCOUNTER — Encounter (INDEPENDENT_AMBULATORY_CARE_PROVIDER_SITE_OTHER): Payer: Self-pay

## 2020-08-11 ENCOUNTER — Ambulatory Visit (INDEPENDENT_AMBULATORY_CARE_PROVIDER_SITE_OTHER): Payer: Medicare HMO | Admitting: Physician Assistant

## 2020-08-12 ENCOUNTER — Other Ambulatory Visit: Payer: 59

## 2020-08-14 ENCOUNTER — Ambulatory Visit: Payer: Self-pay

## 2020-08-14 ENCOUNTER — Ambulatory Visit
Admission: RE | Admit: 2020-08-14 | Discharge: 2020-08-14 | Disposition: A | Discharge status: Home / Self Care | Payer: Self-pay | Source: Ambulatory Visit | Attending: Family Medicine | Admitting: Family Medicine

## 2020-08-14 ENCOUNTER — Other Ambulatory Visit: Payer: Self-pay

## 2020-08-14 DIAGNOSIS — N63 Unspecified lump in unspecified breast: Secondary | ICD-10-CM

## 2020-08-14 DIAGNOSIS — N6313 Unspecified lump in the right breast, lower outer quadrant: Secondary | ICD-10-CM | POA: Diagnosis not present

## 2020-08-14 DIAGNOSIS — N6311 Unspecified lump in the right breast, upper outer quadrant: Secondary | ICD-10-CM | POA: Diagnosis not present

## 2020-08-15 ENCOUNTER — Ambulatory Visit: Payer: Medicare HMO | Attending: Internal Medicine

## 2020-08-15 ENCOUNTER — Other Ambulatory Visit (HOSPITAL_BASED_OUTPATIENT_CLINIC_OR_DEPARTMENT_OTHER): Payer: Self-pay

## 2020-08-15 DIAGNOSIS — Z23 Encounter for immunization: Secondary | ICD-10-CM

## 2020-08-15 MED ORDER — PFIZER-BIONT COVID-19 VAC-TRIS 30 MCG/0.3ML IM SUSP
INTRAMUSCULAR | 0 refills | Status: DC
  Filled 2020-08-15: qty 0.3, 1d supply, fill #0

## 2020-08-20 ENCOUNTER — Ambulatory Visit (INDEPENDENT_AMBULATORY_CARE_PROVIDER_SITE_OTHER): Payer: Medicare HMO | Admitting: Psychology

## 2020-08-20 DIAGNOSIS — F330 Major depressive disorder, recurrent, mild: Secondary | ICD-10-CM | POA: Diagnosis not present

## 2020-08-20 DIAGNOSIS — R69 Illness, unspecified: Secondary | ICD-10-CM | POA: Diagnosis not present

## 2020-08-22 ENCOUNTER — Other Ambulatory Visit (HOSPITAL_COMMUNITY): Payer: Self-pay

## 2020-08-22 ENCOUNTER — Other Ambulatory Visit (INDEPENDENT_AMBULATORY_CARE_PROVIDER_SITE_OTHER): Payer: Self-pay | Admitting: Physician Assistant

## 2020-08-22 DIAGNOSIS — E559 Vitamin D deficiency, unspecified: Secondary | ICD-10-CM

## 2020-08-22 MED FILL — Dexlansoprazole Cap Delayed Release 60 MG: ORAL | 26 days supply | Qty: 26 | Fill #2 | Status: AC

## 2020-08-22 MED FILL — Dexlansoprazole Cap Delayed Release 60 MG: ORAL | 4 days supply | Qty: 4 | Fill #2 | Status: AC

## 2020-08-26 ENCOUNTER — Other Ambulatory Visit (HOSPITAL_COMMUNITY): Payer: Self-pay

## 2020-08-26 ENCOUNTER — Encounter (INDEPENDENT_AMBULATORY_CARE_PROVIDER_SITE_OTHER): Payer: Self-pay | Admitting: Physician Assistant

## 2020-08-26 MED ORDER — VERAPAMIL HCL ER 300 MG PO CP24
300.0000 mg | ORAL_CAPSULE | Freq: Every day | ORAL | 2 refills | Status: DC
  Filled 2020-08-26 – 2020-08-27 (×2): qty 90, 90d supply, fill #0
  Filled 2020-11-22 – 2020-11-24 (×2): qty 90, 90d supply, fill #1
  Filled 2021-02-19: qty 90, 90d supply, fill #2

## 2020-08-26 NOTE — Telephone Encounter (Signed)
Lov:  07/14/20 NV:  09/01/20 Was supposed to f/u in 4 weeks Vitamin d level on 04/08/20:  32.1  Refill request, or would you like to wait until appt next week to refill

## 2020-08-26 NOTE — Telephone Encounter (Signed)
Wait until next appt please. Thanks

## 2020-08-26 NOTE — Telephone Encounter (Signed)
Pt last seen by Tracey Aguilar, PA-C.  

## 2020-08-27 ENCOUNTER — Other Ambulatory Visit (HOSPITAL_COMMUNITY): Payer: Self-pay

## 2020-08-27 ENCOUNTER — Other Ambulatory Visit (INDEPENDENT_AMBULATORY_CARE_PROVIDER_SITE_OTHER): Payer: Self-pay

## 2020-08-27 DIAGNOSIS — E559 Vitamin D deficiency, unspecified: Secondary | ICD-10-CM

## 2020-08-27 MED ORDER — VITAMIN D (ERGOCALCIFEROL) 1.25 MG (50000 UNIT) PO CAPS
ORAL_CAPSULE | ORAL | 0 refills | Status: DC
  Filled 2020-08-27: qty 4, 28d supply, fill #0

## 2020-08-27 NOTE — Telephone Encounter (Signed)
Ok to send    thanks

## 2020-08-27 NOTE — Telephone Encounter (Signed)
Pt last seen by Tracey Aguilar, PA-C.  

## 2020-08-27 NOTE — Telephone Encounter (Signed)
Ok to refill?  Vit D last filled and last OV 07/14/20, Next appt 09/01/20

## 2020-08-28 ENCOUNTER — Ambulatory Visit: Payer: Medicare HMO | Admitting: Physician Assistant

## 2020-09-01 ENCOUNTER — Ambulatory Visit (INDEPENDENT_AMBULATORY_CARE_PROVIDER_SITE_OTHER): Payer: Medicare HMO | Admitting: Physician Assistant

## 2020-09-01 ENCOUNTER — Encounter (INDEPENDENT_AMBULATORY_CARE_PROVIDER_SITE_OTHER): Payer: Self-pay | Admitting: Physician Assistant

## 2020-09-01 ENCOUNTER — Other Ambulatory Visit: Payer: Self-pay

## 2020-09-01 VITALS — BP 123/80 | HR 86 | Temp 97.8°F | Ht 62.0 in | Wt 231.0 lb

## 2020-09-01 DIAGNOSIS — Z6841 Body Mass Index (BMI) 40.0 and over, adult: Secondary | ICD-10-CM | POA: Diagnosis not present

## 2020-09-01 DIAGNOSIS — E1169 Type 2 diabetes mellitus with other specified complication: Secondary | ICD-10-CM

## 2020-09-01 DIAGNOSIS — E785 Hyperlipidemia, unspecified: Secondary | ICD-10-CM

## 2020-09-05 ENCOUNTER — Other Ambulatory Visit: Payer: Self-pay

## 2020-09-05 ENCOUNTER — Telehealth: Payer: Self-pay | Admitting: Physician Assistant

## 2020-09-05 ENCOUNTER — Ambulatory Visit: Payer: Medicare HMO | Admitting: Physician Assistant

## 2020-09-05 ENCOUNTER — Other Ambulatory Visit (HOSPITAL_COMMUNITY): Payer: Self-pay

## 2020-09-05 MED ORDER — BUSPIRONE HCL 5 MG PO TABS
ORAL_TABLET | Freq: Every day | ORAL | 0 refills | Status: DC
Start: 2020-09-05 — End: 2020-10-22
  Filled 2020-09-05: qty 90, 90d supply, fill #0

## 2020-09-05 NOTE — Telephone Encounter (Signed)
Rx sent 

## 2020-09-05 NOTE — Telephone Encounter (Signed)
Pt called in for refill on Buspar 5mg . States she has run out as of today. Appt 8/31. Ph: [phone]. [facility] [address] [place], [place]

## 2020-09-08 NOTE — Progress Notes (Signed)
Chief Complaint:   OBESITY Rhonda Aguilar is here to discuss her progress with her obesity treatment plan along with follow-up of her obesity related diagnoses. Rhonda Aguilar is on the Category 2 Plan +100 calories and states she is following her eating plan approximately 40% of the time. Rhonda Aguilar states she is walking 5,000 2 times per week.   Today's visit was #: 46 Starting weight: 231 lbs Starting date: 12/08/2017 Today's weight: 231 lbs Today's date: 09/01/2020 Total lbs lost to date: 0 Total lbs lost since last in-office visit: 0  Interim History: Rhonda Aguilar is not eating enough protein. She reports feeling tired most of the day. She is not meal prepping or planning. She states that she is seeing a functional medicine doctor tomorrow.  Subjective:   1. Type 2 diabetes mellitus with hyperlipidemia ([name]) Rhonda Aguilar is on Ozempic 0.5 mg weekly. Last A1c was 6.4.  2. Hyperlipidemia associated with type 2 diabetes mellitus ([place]) Rhonda Aguilar on Crestor, and she is tolerating it well. She is managed by Cardiology.  Assessment/Plan:   1. Type 2 diabetes mellitus with hyperlipidemia ([name]) Rhonda Aguilar will continue Ozempic and weight loss. Good blood sugar control is important to decrease the likelihood of diabetic complications such as nephropathy, neuropathy, limb loss, blindness, coronary artery disease, and death. Intensive lifestyle modification including diet, exercise and weight loss are the first line of treatment for diabetes.   2. Hyperlipidemia associated with type 2 diabetes mellitus ([place]) Cardiovascular risk and specific lipid/LDL goals reviewed. We discussed several lifestyle modifications today. Rhonda Aguilar will follow up with Cardiology, and will continue her medications and meal plan. Orders and follow up as documented in patient record.   Counseling Intensive lifestyle modifications are the first line treatment for this issue. Dietary changes: Increase soluble fiber. Decrease simple carbohydrates. Exercise  changes: Moderate to vigorous-intensity aerobic activity 150 minutes per week if tolerated. Lipid-lowering medications: see documented in medical record.  3. Class 3 severe obesity with serious comorbidity and body mass index (BMI) of 40.0 to 44.9 in adult, unspecified obesity type ([place]), current bmi 42.24 Rhonda Aguilar is currently in the action stage of change. As such, her goal is to continue with weight loss efforts. She has agreed to keeping a food journal and adhering to recommended goals of 1300 calories and 90 grams of protein daily.   Exercise goals: As is.  Behavioral modification strategies: meal planning and cooking strategies and keeping healthy foods in the home.  Rhonda Aguilar has agreed to follow-up with our clinic in 3 weeks. She was informed of the importance of frequent follow-up visits to maximize her success with intensive lifestyle modifications for her multiple health conditions.   Objective:   Blood pressure 123/80, pulse 86, temperature 97.8 F (36.6 C), height 5\' 2"  (1.575 m), weight 231 lb (104.8 kg), SpO2 97 %. Body mass index is 42.25 kg/m.  General: Cooperative, alert, well developed, in no acute distress. HEENT: Conjunctivae and lids unremarkable. Cardiovascular: Regular rhythm.  Lungs: Normal work of breathing. Neurologic: No focal deficits.   Lab Results  Component Value Date   CREATININE 0.86 04/08/2020   BUN 20 04/08/2020   NA 137 04/08/2020   K 4.0 04/08/2020   CL 101 04/08/2020   CO2 21 04/08/2020   Lab Results  Component Value Date   ALT 58 (H) 04/08/2020   AST 41 (H) 04/08/2020   ALKPHOS 93 04/08/2020   BILITOT 0.8 04/08/2020   Lab Results  Component Value Date   HGBA1C 6.2 (H) 04/08/2020  HGBA1C 7.0 (H) 08/22/2019   HGBA1C 6.7 (H) 04/19/2019   Lab Results  Component Value Date   INSULIN 43.2 (H) 04/08/2020   INSULIN 33.6 (H) 08/22/2019   INSULIN 40.0 (H) 04/19/2019   INSULIN 40.9 (H) 04/12/2018   INSULIN 38.2 (H) 12/08/2017   Lab Results   Component Value Date   TSH 2.550 12/08/2017   Lab Results  Component Value Date   CHOL 227 (H) 04/08/2020   HDL 42 04/08/2020   LDLCALC 138 (H) 04/08/2020   TRIG 259 (H) 04/08/2020   CHOLHDL 5.4 (H) 04/08/2020   Lab Results  Component Value Date   VD25OH 46.6 04/08/2020   VD25OH 32.1 08/22/2019   VD25OH 26.2 (L) 04/19/2019   Lab Results  Component Value Date   WBC 7.6 09/05/2019   HGB 15.0 09/05/2019   HCT 47.0 (H) 09/05/2019   MCV 85.0 09/05/2019   PLT 228 09/05/2019   No results found for: IRON, TIBC, FERRITIN  Obesity Behavioral Intervention:   Approximately 15 minutes were spent on the discussion below.  ASK: We discussed the diagnosis of obesity with Rhonda Aguilar today and Rhonda Aguilar agreed to give [name] permission to discuss obesity behavioral modification therapy today.  ASSESS: Rhonda Aguilar has the diagnosis of obesity and her BMI today is 42.24. Rhonda Aguilar is in the action stage of change.   ADVISE: Rhonda Aguilar was educated on the multiple health risks of obesity as well as the benefit of weight loss to improve her health. She was advised of the need for long term treatment and the importance of lifestyle modifications to improve her current health and to decrease her risk of future health problems.  AGREE: Multiple dietary modification options and treatment options were discussed and Rhonda Aguilar agreed to follow the recommendations documented in the above note.  ARRANGE: Rhonda Aguilar was educated on the importance of frequent visits to treat obesity as outlined per CMS and USPSTF guidelines and agreed to schedule her next follow up appointment today.  Attestation Statements:   Reviewed by clinician on day of visit: allergies, medications, problem list, medical history, surgical history, family history, social history, and previous encounter notes.   [name], am acting as transcriptionist for [employer], PA-C.  I have reviewed the above documentation for accuracy and completeness, and  I agree with the above. [name], PA-C

## 2020-09-10 ENCOUNTER — Ambulatory Visit (INDEPENDENT_AMBULATORY_CARE_PROVIDER_SITE_OTHER): Payer: Medicare HMO | Admitting: Psychology

## 2020-09-10 DIAGNOSIS — E279 Disorder of adrenal gland, unspecified: Secondary | ICD-10-CM | POA: Diagnosis not present

## 2020-09-10 DIAGNOSIS — F330 Major depressive disorder, recurrent, mild: Secondary | ICD-10-CM

## 2020-09-10 DIAGNOSIS — E8881 Metabolic syndrome: Secondary | ICD-10-CM | POA: Diagnosis not present

## 2020-09-10 DIAGNOSIS — E559 Vitamin D deficiency, unspecified: Secondary | ICD-10-CM | POA: Diagnosis not present

## 2020-09-10 DIAGNOSIS — E782 Mixed hyperlipidemia: Secondary | ICD-10-CM | POA: Diagnosis not present

## 2020-09-10 DIAGNOSIS — E7212 Methylenetetrahydrofolate reductase deficiency: Secondary | ICD-10-CM | POA: Diagnosis not present

## 2020-09-10 DIAGNOSIS — E509 Vitamin A deficiency, unspecified: Secondary | ICD-10-CM | POA: Diagnosis not present

## 2020-09-10 DIAGNOSIS — R69 Illness, unspecified: Secondary | ICD-10-CM | POA: Diagnosis not present

## 2020-09-10 DIAGNOSIS — R7982 Elevated C-reactive protein (CRP): Secondary | ICD-10-CM | POA: Diagnosis not present

## 2020-09-10 DIAGNOSIS — E063 Autoimmune thyroiditis: Secondary | ICD-10-CM | POA: Diagnosis not present

## 2020-09-10 DIAGNOSIS — E039 Hypothyroidism, unspecified: Secondary | ICD-10-CM | POA: Diagnosis not present

## 2020-09-10 DIAGNOSIS — R5383 Other fatigue: Secondary | ICD-10-CM | POA: Diagnosis not present

## 2020-09-11 ENCOUNTER — Ambulatory Visit: Payer: Medicare HMO | Admitting: Adult Health

## 2020-09-11 ENCOUNTER — Encounter: Payer: Self-pay | Admitting: Adult Health

## 2020-09-11 VITALS — BP 139/90 | HR 79 | Ht 62.0 in | Wt 233.2 lb

## 2020-09-11 DIAGNOSIS — Z9989 Dependence on other enabling machines and devices: Secondary | ICD-10-CM | POA: Diagnosis not present

## 2020-09-11 DIAGNOSIS — G4733 Obstructive sleep apnea (adult) (pediatric): Secondary | ICD-10-CM | POA: Diagnosis not present

## 2020-09-11 NOTE — Patient Instructions (Signed)
Continue using CPAP nightly and greater than 4 hours each night °If your symptoms worsen or you develop new symptoms please let us know.  ° °

## 2020-09-11 NOTE — Progress Notes (Signed)
PATIENT: Rhonda Aguilar DOB: May 10, 1955  REASON FOR VISIT: follow up Rhonda FROM: patient Primary neurologist: Dr. Brett Fairy  Rhonda OF PRESENT ILLNESS: Today 09/11/20:  Rhonda Aguilar is a 65 year old female with a Rhonda of obstructive sleep apnea on CPAP.  She reports that the CPAP is working well for her.  She denies any new issues.  She returns today for an evaluation.    06/12/19: Rhonda Aguilar is a 65 year old female with a Rhonda of obstructive sleep apnea on CPAP.  Her download indicates that she use her machine nightly for compliance of 100%.  Leak in the 95th percentile is 8.2.  She reports that the CPAP is working well for her.  She returns today for an evaluation.  She is machine greater than 4 hours each night.  On average she uses her machine 7 hours and 51 minutes.  Her residual AHI is 0.3 on 6 to 16 cm of water with EPR of 1.  Rhonda Aguilar is a 65 y.o. female with underlying medical Rhonda of HTN, DM, migraines, obesity and OSA who was recently evaluated in this office by Dr. Brett Fairy for OSA evaluation on 01/17/2018.  She had been previously diagnosed with OSA in 2012 by this office and continued to use CPAP for management but was referred back to this office as she was lost to follow-up to ensure settings were still appropriate and OSA adequately managed.  She also had complaints of ocular aura migraines with hemiparetic component with Rhonda of migraines (no prior auras) but previously controlled until recently initiating oral metformin for newly diagnosed diabetes.  It was recommended for her to obtain home sleep study without use of CPAP to ensure adequate settings which was obtained on 02/16/2018 and showed AHI 24.9 and REM AHI 52.3 therefore recommended auto titration device with 6 to 16 cm water pressure with a EPR of 2.  Compliance report obtained from 04/02/2018 -05/01/2018 showing 30 out of 30 usage days and 30 days greater than 4 hours 400% compliance.  Average  usage 7 hours and 15 minutes with residual AHI 0.2.  Leaks in the 95th percentile 11.7 L/min and pressure in the 95th percentile 11.3 cm H2O with pressure settings 6 cm H2O to 16 cm H2O with EPR level 1.  She does not have any complaints with current CPAP machine or nasal pillows.  She [facility] to be [facility] management.  She denies any excessive daytime fatigue or insomnia.  Migraines have resolved since discontinuation of diabetic medication.  She continues to have A1c monitored by PCP with most recent level 6.2 not on any diabetic medication.  No further concerns at this time.  She returns today for reevaluation.    REVIEW OF SYSTEMS: Out of a complete 14 system review of symptoms, the patient complains only of the following symptoms, and all other reviewed systems are negative.  FSS 35 ESS 4   ALLERGIES: Allergies  Allergen Reactions   Glyburide Other (See Comments)    Migraines    Metformin And Related Other (See Comments)    migraines   Saxenda [Liraglutide -Weight Management] Nausea Only   Codeine Itching and Rash   Penicillins Rash   Sulfa Antibiotics Rash    HOME MEDICATIONS: Outpatient Medications Prior to Visit  Medication Sig Dispense Refill   busPIRone (BUSPAR) 5 MG tablet TAKE 1 TABLET BY MOUTH ONCE A DAY 90 tablet 0   cetirizine (ZYRTEC) 10 MG tablet Take  10 mg by mouth at bedtime.      clobetasol ointment (TEMOVATE) 0.05 % Apply Externally Twice a day as needed 15 g 1   DEXILANT 60 MG capsule TAKE 1 CAPSULE BY MOUTH ONCE DAILY 30 capsule 8   glucose blood test strip USE TO TEST BLOOD SUGAR TWICE DAILY (PLEASE KEEP APPT FOR 06/19/20) (Patient taking differently: USE TO TEST BLOOD SUGAR TWICE DAILY (PLEASE KEEP APPT FOR 06/19/20)) 300 strip 3   hydrochlorothiazide (HYDRODIURIL) 25 MG tablet Take 12.5 mg by mouth as needed.      ibuprofen (ADVIL) 200 MG tablet Take 600 mg by mouth every 8 (eight) hours as needed for mild pain or  moderate pain.      Lancets (FREESTYLE) lancets USE AS DIRECTED TO CHECK BLOOD SUGAR 2 TIMES DAILY (Patient taking differently: USE AS DIRECTED TO CHECK BLOOD SUGAR 2 TIMES DAILY) 300 each 4   nystatin-triamcinolone ointment (MYCOLOG) APPLY TO THE AFFECTED AREA(S) (ANUS AND BREAST FOLDS) TWO TIMES DAILY AS NEEDED FOR 90 DAYS. 30 g 0   Probiotic Product (ALIGN) 4 MG CAPS Take 1 capsule by mouth at bedtime.      rosuvastatin (CRESTOR) 5 MG tablet Take 5 mg by mouth 3 (three) times a week.     Semaglutide, 1 MG/DOSE, 4 MG/3ML SOPN INJECT 1 MG UNDER THE SKIN ONCE A WEEK (Patient not taking: No sig reported) 3 mL 1   Semaglutide, 1 MG/DOSE, 4 MG/3ML SOPN INJECT 1 MG INTO THE SKIN ONCE A WEEK. (Patient not taking: No sig reported) 6 mL 0   Semaglutide,0.25 or 0.5MG /DOS, 2 MG/1.5ML SOPN INJECT 0.5 MG SUBCUTANEOUSLY ONCE A WEEK. 4.5 mL 0   Semaglutide,0.25 or 0.5MG /DOS, 2 MG/1.5ML SOPN INJECT 0.25MG  UNDER THE SKIN ONCE A WEEK. AFTER 4 WEEKS MAY INCREASE TO 0.5MG  ONCE A WEEK 90 (Patient not taking: No sig reported) 4.5 mL 0   valsartan (DIOVAN) 320 MG tablet Take 320 mg by mouth daily.  (Patient not taking: No sig reported)     valsartan (DIOVAN) 320 MG tablet TAKE 1 TABLET BY MOUTH ONCE A DAY (Patient not taking: No sig reported) 90 tablet 1   valsartan (DIOVAN) 320 MG tablet TAKE 1 TABLET BY MOUTH ONCE A DAY 90 tablet 1   valsartan (DIOVAN) 320 MG tablet Take 1 tablet by mouth once a day. (Patient not taking: No sig reported) 90 tablet 0   Verapamil HCl CR 300 MG CP24 Take 300 mg by mouth at bedtime.  (Patient not taking: No sig reported)     Verapamil HCl CR 300 MG CP24 TAKE 1 CAPSULE BY MOUTH ONCE A DAY (Patient not taking: No sig reported) 90 capsule 1   Verapamil HCl CR 300 MG CP24 TAKE 1 CAPSULE BY MOUTH ONCE A DAY 90 capsule 1   Verapamil HCl CR 300 MG CP24 Take 1 capsule (300 mg total) by mouth daily. 90 capsule 2   Vitamin D, Ergocalciferol, (DRISDOL) 1.25 MG (50000 UNIT) CAPS capsule TAKE 1  CAPSULE BY MOUTH ONCE A WEEK. 4 capsule 0   COVID-19 mRNA Vac-TriS, Pfizer, (PFIZER-BIONT COVID-19 VAC-TRIS) SUSP injection Inject into the muscle. 0.3 mL 0   dexlansoprazole (DEXILANT) 60 MG capsule Take 60 mg by mouth daily.     Facility-Administered Medications Prior to Visit  Medication Dose Route Frequency Provider Last Rate Last Admin   sodium chloride flush (NS) 0.9 % injection 10 mL  10 mL Intravenous PRN Sueanne Margarita, MD   20 mL at 11/15/19 [phone]  PAST MEDICAL Rhonda: Past Medical Rhonda:  Diagnosis Date   Anxiety    Arthritis    Benign essential HTN 11/22/2014   Diabetes mellitus, type II ([name])    Dyspnea    On exertion   Fatty liver    GERD (gastroesophageal reflux disease)    Hypertension    IBS (irritable bowel syndrome)    Migraine    Obesity (BMI 30-39.9) 11/22/2014   OSA (obstructive sleep apnea)    Pneumonia     PAST SURGICAL Rhonda: Past Surgical Rhonda:  Procedure Laterality Date   CHOLECYSTECTOMY N/A 09/14/2019   Procedure: LAPAROSCOPIC CHOLECYSTECTOMY;  Surgeon: Clovis Riley, MD;  Location: [facility];  Service: General;  Laterality: N/A;   COLONOSCOPY     TONSILLECTOMY      FAMILY Rhonda: Family Rhonda  Problem Relation Age of Onset   Diabetes Mother    Heart disease Mother    Hypertension Mother    High Cholesterol Mother    Kidney disease Mother    Depression Mother    Obesity Mother    Coronary artery disease Father    High blood pressure Father    High Cholesterol Father    Depression Father    Hypertension Brother     SOCIAL Rhonda: Social Rhonda   Socioeconomic Rhonda   Marital status: Divorced    Spouse name: Not on file   Number of children: 0   Years of education: college   Highest education level: Not on file  Occupational Rhonda   Occupation: Engineer, mining cardiology  Tobacco Use   Smoking status: Never   Smokeless tobacco: Never  Vaping Use   Vaping Use: Never used  Substance and Sexual Activity    Alcohol use: Yes    Alcohol/week: 0.0 - 1.0 standard drinks    Comment: occas.   Drug use: Never   Sexual activity: Not on file  Other Topics Concern   Not on file  Social Rhonda Narrative   Not on file   Social Determinants of Health   Financial Resource Strain: Not on file  Food Insecurity: Not on file  Transportation Needs: Not on file  Physical Activity: Not on file  Stress: Not on file  Social Connections: Not on file  Intimate Partner Violence: Not on file      PHYSICAL EXAM  Vitals:   09/11/20 1403  Weight: 233 lb 3.2 oz (105.8 kg)  Height: 5\' 2"  (1.575 m)   Body mass index is 42.65 kg/m.  Generalized: Well developed, in no acute distress  Chest: Lungs clear to auscultation bilaterally  Neurological examination  Mentation: Alert oriented to time, place, Rhonda taking. Follows all commands speech and language fluent Cranial nerve II-XII: Extraocular movements were full, visual field were full on confrontational test Head turning and shoulder shrug  were normal and symmetric. Motor: The motor testing reveals 5 over 5 strength of all 4 extremities. Good symmetric motor tone is noted throughout.  Sensory: Sensory testing is intact to soft touch on all 4 extremities. No evidence of extinction is noted.  Gait and station: Gait is normal.    DIAGNOSTIC DATA (LABS, IMAGING, TESTING) - I reviewed patient records, labs, notes, testing and imaging myself where available.  Lab Results  Component Value Date   WBC 7.6 09/05/2019   HGB 15.0 09/05/2019   HCT 47.0 (H) 09/05/2019   MCV 85.0 09/05/2019   PLT 228 09/05/2019      Component Value Date/Time   NA 137 04/08/2020 1012  K 4.0 04/08/2020 1012   CL 101 04/08/2020 1012   CO2 21 04/08/2020 1012   GLUCOSE 118 (H) 04/08/2020 1012   GLUCOSE 192 (H) 09/05/2019 1030   BUN 20 04/08/2020 1012   CREATININE 0.86 04/08/2020 1012   CALCIUM 9.1 04/08/2020 1012   PROT 6.8 04/08/2020 1012   ALBUMIN 4.4 04/08/2020 1012    AST 41 (H) 04/08/2020 1012   ALT 58 (H) 04/08/2020 1012   ALKPHOS 93 04/08/2020 1012   BILITOT 0.8 04/08/2020 1012   GFRNONAA 71 04/08/2020 1012   GFRAA 81 04/08/2020 1012   Lab Results  Component Value Date   CHOL 227 (H) 04/08/2020   HDL 42 04/08/2020   LDLCALC 138 (H) 04/08/2020   TRIG 259 (H) 04/08/2020   CHOLHDL 5.4 (H) 04/08/2020   Lab Results  Component Value Date   HGBA1C 6.2 (H) 04/08/2020   No results found for: [id number] Lab Results  Component Value Date   TSH 2.550 12/08/2017      ASSESSMENT AND PLAN 65 y.o. year old female  has a past medical Rhonda of Anxiety, Arthritis, Benign essential HTN (11/22/2014), Diabetes mellitus, type II ([place]), Dyspnea, Fatty liver, GERD (gastroesophageal reflux disease), Hypertension, IBS (irritable bowel syndrome), Migraine, Obesity (BMI 30-39.9) (11/22/2014), OSA (obstructive sleep apnea), and Pneumonia. here with:  OSA on CPAP  - CPAP compliance excellent - Good treatment of AHI  - Encourage patient to use CPAP nightly and > 4 hours each night - F/U in 1 year or sooner if needed   I spent 20  minutes of face-to-face and non-face-to-face time with patient.  This included previsit chart review, lab review, study review, order entry, electronic health record documentation, patient education.  Ward Givens, MSN, NP-C 09/11/2020, 2:09 PM [facility] [address] [place], [place] [address] [phone]

## 2020-09-12 ENCOUNTER — Other Ambulatory Visit (HOSPITAL_COMMUNITY): Payer: Self-pay

## 2020-09-12 MED FILL — Semaglutide Soln Pen-inj 1 MG/DOSE (4 MG/3ML): SUBCUTANEOUS | 28 days supply | Qty: 3 | Fill #1 | Status: AC

## 2020-09-13 ENCOUNTER — Other Ambulatory Visit (HOSPITAL_COMMUNITY): Payer: Self-pay

## 2020-09-16 ENCOUNTER — Ambulatory Visit: Payer: Self-pay | Admitting: Psychology

## 2020-09-17 DIAGNOSIS — G4733 Obstructive sleep apnea (adult) (pediatric): Secondary | ICD-10-CM | POA: Diagnosis not present

## 2020-09-17 DIAGNOSIS — G4730 Sleep apnea, unspecified: Secondary | ICD-10-CM | POA: Diagnosis not present

## 2020-09-20 ENCOUNTER — Other Ambulatory Visit (HOSPITAL_COMMUNITY): Payer: Self-pay

## 2020-09-20 MED FILL — Valsartan Tab 320 MG: ORAL | 90 days supply | Qty: 90 | Fill #0 | Status: AC

## 2020-09-22 ENCOUNTER — Other Ambulatory Visit (HOSPITAL_COMMUNITY): Payer: Self-pay

## 2020-09-24 ENCOUNTER — Ambulatory Visit (INDEPENDENT_AMBULATORY_CARE_PROVIDER_SITE_OTHER): Payer: Medicare HMO | Admitting: Physician Assistant

## 2020-09-24 ENCOUNTER — Other Ambulatory Visit (HOSPITAL_COMMUNITY): Payer: Self-pay

## 2020-09-24 MED ORDER — FREESTYLE LIBRE 14 DAY SENSOR MISC
1 refills | Status: DC
  Filled 2020-09-24 – 2020-09-26 (×2): qty 2, 28d supply, fill #0

## 2020-09-26 ENCOUNTER — Other Ambulatory Visit (HOSPITAL_COMMUNITY): Payer: Self-pay

## 2020-09-29 ENCOUNTER — Encounter (INDEPENDENT_AMBULATORY_CARE_PROVIDER_SITE_OTHER): Payer: Self-pay | Admitting: Physician Assistant

## 2020-09-30 ENCOUNTER — Encounter (INDEPENDENT_AMBULATORY_CARE_PROVIDER_SITE_OTHER): Payer: Self-pay

## 2020-09-30 ENCOUNTER — Other Ambulatory Visit (HOSPITAL_COMMUNITY): Payer: Self-pay

## 2020-09-30 DIAGNOSIS — K760 Fatty (change of) liver, not elsewhere classified: Secondary | ICD-10-CM | POA: Diagnosis not present

## 2020-09-30 DIAGNOSIS — K219 Gastro-esophageal reflux disease without esophagitis: Secondary | ICD-10-CM | POA: Diagnosis not present

## 2020-09-30 DIAGNOSIS — Z86010 Personal history of colonic polyps: Secondary | ICD-10-CM | POA: Diagnosis not present

## 2020-09-30 MED ORDER — DEXLANSOPRAZOLE 60 MG PO CPDR
DELAYED_RELEASE_CAPSULE | ORAL | 4 refills | Status: DC
  Filled 2020-09-30: qty 30, 30d supply, fill #0
  Filled 2020-12-16: qty 30, 30d supply, fill #1
  Filled 2021-01-26: qty 30, 30d supply, fill #2
  Filled 2021-02-19: qty 30, 30d supply, fill #3
  Filled 2021-08-25: qty 30, 30d supply, fill #4

## 2020-10-01 ENCOUNTER — Other Ambulatory Visit (HOSPITAL_COMMUNITY): Payer: Self-pay

## 2020-10-08 ENCOUNTER — Ambulatory Visit (INDEPENDENT_AMBULATORY_CARE_PROVIDER_SITE_OTHER): Payer: Medicare HMO | Admitting: Physician Assistant

## 2020-10-14 ENCOUNTER — Encounter (INDEPENDENT_AMBULATORY_CARE_PROVIDER_SITE_OTHER): Payer: Self-pay | Admitting: Bariatrics

## 2020-10-14 ENCOUNTER — Ambulatory Visit (INDEPENDENT_AMBULATORY_CARE_PROVIDER_SITE_OTHER): Payer: Medicare HMO | Admitting: Family Medicine

## 2020-10-14 ENCOUNTER — Ambulatory Visit (INDEPENDENT_AMBULATORY_CARE_PROVIDER_SITE_OTHER): Payer: Medicare HMO | Admitting: Bariatrics

## 2020-10-14 ENCOUNTER — Other Ambulatory Visit: Payer: Self-pay

## 2020-10-14 ENCOUNTER — Other Ambulatory Visit (HOSPITAL_COMMUNITY): Payer: Self-pay

## 2020-10-14 VITALS — BP 109/72 | HR 76 | Temp 97.5°F | Ht 62.0 in | Wt 229.0 lb

## 2020-10-14 DIAGNOSIS — K760 Fatty (change of) liver, not elsewhere classified: Secondary | ICD-10-CM | POA: Diagnosis not present

## 2020-10-14 DIAGNOSIS — Z6841 Body Mass Index (BMI) 40.0 and over, adult: Secondary | ICD-10-CM

## 2020-10-14 DIAGNOSIS — E559 Vitamin D deficiency, unspecified: Secondary | ICD-10-CM | POA: Diagnosis not present

## 2020-10-14 DIAGNOSIS — I10 Essential (primary) hypertension: Secondary | ICD-10-CM

## 2020-10-14 MED ORDER — VITAMIN D (ERGOCALCIFEROL) 1.25 MG (50000 UNIT) PO CAPS
ORAL_CAPSULE | ORAL | 0 refills | Status: DC
  Filled 2020-10-14: qty 12, 84d supply, fill #0

## 2020-10-14 MED ORDER — VITAMIN D (ERGOCALCIFEROL) 1.25 MG (50000 UNIT) PO CAPS
ORAL_CAPSULE | ORAL | 0 refills | Status: DC
  Filled 2020-10-14: qty 4, fill #0

## 2020-10-15 NOTE — Progress Notes (Signed)
Chief Complaint:   OBESITY Rhonda Aguilar is here to discuss her progress with her obesity treatment plan along with follow-up of her obesity related diagnoses. Rhonda Aguilar is on the Category 2 Plan and Whole 30 about 1 month states she is following her eating plan approximately 50% of the time. Rhonda Aguilar states she is walking for 10 minutes 3 times per week.  Today's visit was #: 75 Starting weight: 231 lbs Starting date: 12/08/2017 Today's weight: 229 lbs Today's date:10/14/2020 Total lbs lost to date: 2 lbs Total lbs lost since last in-office visit: 2 lbs  Interim History: Rhonda Aguilar is down 2 lbs since her last visit. She was having more and more GERD and taking Dexilant ever 3rd day. She has cut out certain foods.  Subjective:   1. Vitamin D deficiency Rhonda Aguilar is currently taking her medications as directed.  2. Essential hypertension Rhonda Aguilar is currently taking HCTZ, Diovan and Verapamil.  Her hypertension is controlled.  3. Fatty liver Rhonda Aguilar's last AST - 61, ALT=79.  Assessment/Plan:   1. Vitamin D deficiency Low Vitamin D level contributes to fatigue and are associated with obesity, breast, and colon cancer. We will refill  prescription Vitamin D 50,000 IU every week for 1 month with no refills and Rhonda Aguilar will follow-up for routine testing of Vitamin D, at least 2-3 times per year to avoid over-replacement.  - Vitamin D, Ergocalciferol, (DRISDOL) 1.25 MG (50000 UNIT) CAPS capsule; TAKE 1 CAPSULE BY MOUTH ONCE A WEEK.  Dispense: 12 capsule; Refill: 0  2. Essential hypertension Tatumn is working on healthy weight loss and exercise to improve blood pressure control. Rhonda Aguilar will continue her mediations. We will watch for signs of hypotension as she continues her lifestyle modifications.  3. Fatty liver Rhonda Aguilar will follow our treatment.  4. Obesity, current BMI 41.9 Rhonda Aguilar is currently in the action stage of change. As such, her goal is to continue with weight loss efforts. She has agreed to the  Category 2 Plan and keeping a food journal and adhering to recommended goals of 1300 calories and 80 grams of protein and Whole 30 about 1 month.  Rhonda Aguilar will continue meal planning. She will continue intentional eating. She will adhere closely to the plan (80-85%). We reviewed labs today.  Exercise goals:  As is.  Behavioral modification strategies: increasing lean protein intake, decreasing simple carbohydrates, increasing vegetables, increasing water intake, decreasing eating out, no skipping meals, meal planning and cooking strategies, keeping healthy foods in the home, and planning for success.  Rhonda Aguilar has agreed to follow-up with our clinic in 6-8 weeks. She was informed of the importance of frequent follow-up visits to maximize her success with intensive lifestyle modifications for her multiple health conditions.   Objective:   Blood pressure 109/72, pulse 76, temperature (!) 97.5 F (36.4 C), height '5\' 2"'$  (1.575 m), weight 229 lb (103.9 kg), SpO2 93 %. Body mass index is 41.88 kg/m.  General: Cooperative, alert, well developed, in no acute distress. HEENT: Conjunctivae and lids unremarkable. Cardiovascular: Regular rhythm.  Lungs: Normal work of breathing. Neurologic: No focal deficits.   Lab Results  Component Value Date   CREATININE 0.86 04/08/2020   BUN 20 04/08/2020   NA 137 04/08/2020   K 4.0 04/08/2020   CL 101 04/08/2020   CO2 21 04/08/2020   Lab Results  Component Value Date   ALT 58 (H) 04/08/2020   AST 41 (H) 04/08/2020   ALKPHOS 93 04/08/2020   BILITOT 0.8 04/08/2020   Lab Results  Component Value Date   HGBA1C 6.2 (H) 04/08/2020   HGBA1C 7.0 (H) 08/22/2019   HGBA1C 6.7 (H) 04/19/2019   Lab Results  Component Value Date   INSULIN 43.2 (H) 04/08/2020   INSULIN 33.6 (H) 08/22/2019   INSULIN 40.0 (H) 04/19/2019   INSULIN 40.9 (H) 04/12/2018   INSULIN 38.2 (H) 12/08/2017   Lab Results  Component Value Date   TSH 2.550 12/08/2017   Lab Results   Component Value Date   CHOL 227 (H) 04/08/2020   HDL 42 04/08/2020   LDLCALC 138 (H) 04/08/2020   TRIG 259 (H) 04/08/2020   CHOLHDL 5.4 (H) 04/08/2020   Lab Results  Component Value Date   VD25OH 46.6 04/08/2020   VD25OH 32.1 08/22/2019   VD25OH 26.2 (L) 04/19/2019   Lab Results  Component Value Date   WBC 7.6 09/05/2019   HGB 15.0 09/05/2019   HCT 47.0 (H) 09/05/2019   MCV 85.0 09/05/2019   PLT 228 09/05/2019   No results found for: IRON, TIBC, FERRITIN  Obesity Behavioral Intervention:   Approximately 15 minutes were spent on the discussion below.  ASK: We discussed the diagnosis of obesity with Rhonda Aguilar today and Rhonda Aguilar agreed to give [name] permission to discuss obesity behavioral modification therapy today.  ASSESS: Rhonda Aguilar has the diagnosis of obesity and her BMI today is 41.9. Rhonda Aguilar is in the action stage of change.   ADVISE: Rhonda Aguilar was educated on the multiple health risks of obesity as well as the benefit of weight loss to improve her health. She was advised of the need for long term treatment and the importance of lifestyle modifications to improve her current health and to decrease her risk of future health problems.  AGREE: Multiple dietary modification options and treatment options were discussed and Rhonda Aguilar agreed to follow the recommendations documented in the above note.  ARRANGE: Rhonda Aguilar was educated on the importance of frequent visits to treat obesity as outlined per CMS and USPSTF guidelines and agreed to schedule her next follow up appointment today.  Attestation Statements:   Reviewed by clinician on day of visit: allergies, medications, problem list, medical history, surgical history, family history, social history, and previous encounter notes.  I, Lizbeth Bark, RMA, am acting as Location manager for [employer], DO.   I have reviewed the above documentation for accuracy and completeness, and I agree with the above. Jearld Lesch, DO

## 2020-10-16 ENCOUNTER — Encounter (INDEPENDENT_AMBULATORY_CARE_PROVIDER_SITE_OTHER): Payer: Self-pay | Admitting: Bariatrics

## 2020-10-18 DIAGNOSIS — G4733 Obstructive sleep apnea (adult) (pediatric): Secondary | ICD-10-CM | POA: Diagnosis not present

## 2020-10-18 DIAGNOSIS — G4730 Sleep apnea, unspecified: Secondary | ICD-10-CM | POA: Diagnosis not present

## 2020-10-22 ENCOUNTER — Other Ambulatory Visit: Payer: Self-pay

## 2020-10-22 ENCOUNTER — Other Ambulatory Visit (HOSPITAL_COMMUNITY): Payer: Self-pay

## 2020-10-22 ENCOUNTER — Ambulatory Visit: Payer: Medicare HMO | Admitting: Physician Assistant

## 2020-10-22 ENCOUNTER — Encounter: Payer: Self-pay | Admitting: Physician Assistant

## 2020-10-22 DIAGNOSIS — R69 Illness, unspecified: Secondary | ICD-10-CM | POA: Diagnosis not present

## 2020-10-22 DIAGNOSIS — F411 Generalized anxiety disorder: Secondary | ICD-10-CM

## 2020-10-22 DIAGNOSIS — G4733 Obstructive sleep apnea (adult) (pediatric): Secondary | ICD-10-CM | POA: Diagnosis not present

## 2020-10-22 MED ORDER — BUSPIRONE HCL 5 MG PO TABS
5.0000 mg | ORAL_TABLET | Freq: Every day | ORAL | 3 refills | Status: DC
  Filled 2020-10-22 – 2020-12-16 (×2): qty 90, 90d supply, fill #0
  Filled 2021-03-18: qty 90, 90d supply, fill #1
  Filled 2021-06-17: qty 90, 90d supply, fill #2
  Filled 2021-09-14: qty 90, 90d supply, fill #3

## 2020-10-22 NOTE — Progress Notes (Signed)
[facility]  Patient ID: Rhonda Aguilar,  MRN: [id number]  PCP: Glenis Smoker, MD  Date of Evaluation: 10/22/2020 Time spent:20 minutes  Chief Complaint:  Chief Complaint   Anxiety; Follow-up      HISTORY/CURRENT STATUS: For f/u of anxiety.  Feels like she's in a good place. Was in therapy with Beola Cord, but the therapist didn't join in on the call. Carrine even called her office twice during that 40 minutes, asking what was going on, got VM, then got a bill for a no show. Frustrated.   Patient denies loss of interest in usual activities and is able to enjoy things.  Denies decreased energy or motivation.  Appetite has not changed.  No extreme sadness, tearfulness, or feelings of hopelessness.  Denies any changes in concentration, making decisions or remembering things.  Sleeps good. Wakes up occasionally to go to the bathroom.  Not having a lot of anxiety. Does use the Xanax occas. Denies suicidal or homicidal thoughts.  Patient denies increased energy with decreased need for sleep, no increased talkativeness, no racing thoughts, no impulsivity or risky behaviors, no increased spending, no increased libido, no grandiosity, no increased irritability or anger, no paranoia, and no hallucinations.  Denies dizziness, syncope, seizures, numbness, tingling, tremor, unsteady gait, slurred speech, confusion. Denies muscle or joint pain, stiffness, or dystonia.  Individual Medical History/ Review of Systems: Changes? :Yes   Family Functional medicine, seeing a nutritionist.   Past medications for mental health diagnoses include:  Pristiq, Zoloft caused her to be more depressed.  Allergies: Glyburide, Metformin and related, Saxenda [liraglutide -weight management], Codeine, Penicillins, and Sulfa antibiotics  Current Medications:  Current Outpatient Medications:    cetirizine (ZYRTEC) 10 MG tablet, Take 10 mg by mouth at bedtime. , Disp: , Rfl:    clobetasol  ointment (TEMOVATE) 0.05 %, Apply Externally Twice a day as needed, Disp: 15 g, Rfl: 1   dexlansoprazole (DEXILANT) 60 MG capsule, TAKE 1 CAPSULE BY MOUTH ONCE DAILY  20 MINUTES BEFORE BREAKFAST, Disp: 90 capsule, Rfl: 4   glucose blood test strip, USE TO TEST BLOOD SUGAR TWICE DAILY (PLEASE KEEP APPT FOR 06/19/20) (Patient taking differently: USE TO TEST BLOOD SUGAR TWICE DAILY (PLEASE KEEP APPT FOR 06/19/20)), Disp: 300 strip, Rfl: 3   hydrochlorothiazide (HYDRODIURIL) 25 MG tablet, Take 12.5 mg by mouth as needed. , Disp: , Rfl:    ibuprofen (ADVIL) 200 MG tablet, Take 600 mg by mouth every 8 (eight) hours as needed for mild pain or moderate pain. , Disp: , Rfl:    Lancets (FREESTYLE) lancets, USE AS DIRECTED TO CHECK BLOOD SUGAR 2 TIMES DAILY (Patient taking differently: USE AS DIRECTED TO CHECK BLOOD SUGAR 2 TIMES DAILY), Disp: 300 each, Rfl: 4   Multiple Vitamin (MULTIVITAMIN) tablet, Take 1 tablet by mouth daily. Ultra gastric tabs / supplements, Disp: , Rfl:    nystatin-triamcinolone ointment (MYCOLOG), APPLY TO THE AFFECTED AREA(S) (ANUS AND BREAST FOLDS) TWO TIMES DAILY AS NEEDED FOR 90 DAYS., Disp: 30 g, Rfl: 0   Probiotic Product (ALIGN) 4 MG CAPS, Take 1 capsule by mouth at bedtime. , Disp: , Rfl:    rosuvastatin (CRESTOR) 5 MG tablet, Take 5 mg by mouth 3 (three) times a week., Disp: , Rfl:    Semaglutide, 1 MG/DOSE, 4 MG/3ML SOPN, INJECT 1 MG UNDER THE SKIN ONCE A WEEK (Patient taking differently: 0.5 mg.), Disp: 3 mL, Rfl: 1   valsartan (DIOVAN) 320 MG tablet, TAKE 1 TABLET BY MOUTH ONCE  A DAY, Disp: 90 tablet, Rfl: 1   Verapamil HCl CR 300 MG CP24, Take 1 capsule (300 mg total) by mouth daily., Disp: 90 capsule, Rfl: 2   Vitamin D, Ergocalciferol, (DRISDOL) 1.25 MG (50000 UNIT) CAPS capsule, TAKE 1 CAPSULE BY MOUTH ONCE A WEEK., Disp: 12 capsule, Rfl: 0   busPIRone (BUSPAR) 5 MG tablet, Take 1 tablet (5 mg total) by mouth daily., Disp: 90 tablet, Rfl: 3   Semaglutide, 1 MG/DOSE, 4  MG/3ML SOPN, INJECT 1 MG INTO THE SKIN ONCE A WEEK., Disp: 6 mL, Rfl: 0 No current facility-administered medications for this visit.  Facility-Administered Medications Ordered in Other Visits:    sodium chloride flush (NS) 0.9 % injection 10 mL, 10 mL, Intravenous, PRN, Radford Pax, Traci R, MD, 20 mL at 11/15/19 [id number] Medication Side Effects: none  Family Medical/ Social History: Changes?  Semiretired  MENTAL HEALTH EXAM:  There were no vitals taken for this visit.There is no height or weight on file to calculate BMI.  General Appearance: Casual, Neat, Well Groomed and Obese  Eye Contact:  Good  Speech:  Clear and Coherent and Normal Rate  Volume:  Normal  Mood:  Euthymic  Affect:  Appropriate  Thought Process:  Goal Directed and Descriptions of Associations: Circumstantial  Orientation:  Full (Time, Place, and Person)  Thought Content: Logical   Suicidal Thoughts:  No  Homicidal Thoughts:  No  Memory:  WNL  Judgement:  Good  Insight:  Good  Psychomotor Activity:  Normal  Concentration:  Concentration: Good and Attention Span: Good  Recall:  Good  Fund of Knowledge: Good  Language: Good  Assets:  Desire for Improvement  ADL's:  Intact  Cognition: WNL  Prognosis:  Good    DIAGNOSES:    ICD-10-CM   1. Generalized anxiety disorder  F41.1     2. Obstructive sleep apnea  G47.33        Receiving Psychotherapy: No    RECOMMENDATIONS:  PDMP reviewed.  Last Xanax filled 03/03/2020. I provided 20 minutes of face to face time during this encounter, including time spent before and after the visit in records review, medical decision making, and charting.  I am glad to see her doing so well.  No changes in medications are necessary. She may, or may not look for another counselor.  Feels like she is in a good place now and may not even need 1. Continue BuSpar 5 mg, qd.  Continue Xanax 0.5 mg, 1/2-1 p.o. 3 times daily as needed. Return in 1 year.  Donnal Moat, PA-C

## 2020-10-23 ENCOUNTER — Other Ambulatory Visit (HOSPITAL_COMMUNITY): Payer: Self-pay

## 2020-10-28 ENCOUNTER — Other Ambulatory Visit (HOSPITAL_COMMUNITY): Payer: Self-pay

## 2020-11-07 ENCOUNTER — Other Ambulatory Visit (HOSPITAL_COMMUNITY): Payer: Self-pay

## 2020-11-07 DIAGNOSIS — R69 Illness, unspecified: Secondary | ICD-10-CM | POA: Diagnosis not present

## 2020-11-07 DIAGNOSIS — Z0000 Encounter for general adult medical examination without abnormal findings: Secondary | ICD-10-CM | POA: Diagnosis not present

## 2020-11-07 DIAGNOSIS — E782 Mixed hyperlipidemia: Secondary | ICD-10-CM | POA: Diagnosis not present

## 2020-11-07 DIAGNOSIS — E1165 Type 2 diabetes mellitus with hyperglycemia: Secondary | ICD-10-CM | POA: Diagnosis not present

## 2020-11-07 DIAGNOSIS — I10 Essential (primary) hypertension: Secondary | ICD-10-CM | POA: Diagnosis not present

## 2020-11-07 MED ORDER — JARDIANCE 25 MG PO TABS
25.0000 mg | ORAL_TABLET | Freq: Every day | ORAL | 3 refills | Status: DC
  Filled 2020-11-07 – 2020-11-22 (×2): qty 30, 30d supply, fill #0
  Filled 2020-11-24: qty 90, 90d supply, fill #0
  Filled 2021-03-09: qty 90, 90d supply, fill #1

## 2020-11-07 MED ORDER — EZETIMIBE 10 MG PO TABS
ORAL_TABLET | ORAL | 3 refills | Status: DC
  Filled 2020-11-07: qty 90, 90d supply, fill #0

## 2020-11-07 MED ORDER — ROSUVASTATIN CALCIUM 5 MG PO TABS
ORAL_TABLET | ORAL | 3 refills | Status: DC
  Filled 2020-11-07: qty 12, 84d supply, fill #0
  Filled 2021-02-19: qty 12, 84d supply, fill #1

## 2020-11-17 ENCOUNTER — Other Ambulatory Visit: Payer: Self-pay

## 2020-11-17 ENCOUNTER — Other Ambulatory Visit (HOSPITAL_BASED_OUTPATIENT_CLINIC_OR_DEPARTMENT_OTHER): Payer: Self-pay

## 2020-11-17 ENCOUNTER — Ambulatory Visit: Payer: Medicare HMO | Attending: Internal Medicine

## 2020-11-17 ENCOUNTER — Other Ambulatory Visit (HOSPITAL_COMMUNITY): Payer: Self-pay

## 2020-11-17 DIAGNOSIS — Z23 Encounter for immunization: Secondary | ICD-10-CM

## 2020-11-17 MED ORDER — PFIZER COVID-19 VAC BIVALENT 30 MCG/0.3ML IM SUSP
INTRAMUSCULAR | 0 refills | Status: DC
  Filled 2020-11-17: qty 0.3, 1d supply, fill #0

## 2020-11-17 NOTE — Progress Notes (Signed)
   Covid-19 Vaccination Clinic  Name:  Rhonda Aguilar    MRN: [id number] DOB: 1955-10-25  11/17/2020  Rhonda Aguilar was observed post Covid-19 immunization for 15 minutes without incident. She was provided with Vaccine Information Sheet and instruction to access the V-Safe system.   Rhonda Aguilar was instructed to call 911 with any severe reactions post vaccine: Difficulty breathing  Swelling of face and throat  A fast heartbeat  A bad rash all over body  Dizziness and weakness

## 2020-11-18 DIAGNOSIS — G4730 Sleep apnea, unspecified: Secondary | ICD-10-CM | POA: Diagnosis not present

## 2020-11-18 DIAGNOSIS — G4733 Obstructive sleep apnea (adult) (pediatric): Secondary | ICD-10-CM | POA: Diagnosis not present

## 2020-11-20 ENCOUNTER — Encounter (INDEPENDENT_AMBULATORY_CARE_PROVIDER_SITE_OTHER): Payer: Self-pay | Admitting: Bariatrics

## 2020-11-20 ENCOUNTER — Other Ambulatory Visit: Payer: Self-pay

## 2020-11-20 ENCOUNTER — Ambulatory Visit (INDEPENDENT_AMBULATORY_CARE_PROVIDER_SITE_OTHER): Payer: Medicare HMO | Admitting: Bariatrics

## 2020-11-20 ENCOUNTER — Other Ambulatory Visit (HOSPITAL_COMMUNITY): Payer: Self-pay

## 2020-11-20 VITALS — BP 129/74 | HR 78 | Temp 97.9°F | Ht 62.0 in | Wt 228.0 lb

## 2020-11-20 DIAGNOSIS — I10 Essential (primary) hypertension: Secondary | ICD-10-CM

## 2020-11-20 DIAGNOSIS — E785 Hyperlipidemia, unspecified: Secondary | ICD-10-CM | POA: Diagnosis not present

## 2020-11-20 DIAGNOSIS — E1169 Type 2 diabetes mellitus with other specified complication: Secondary | ICD-10-CM

## 2020-11-20 DIAGNOSIS — Z6841 Body Mass Index (BMI) 40.0 and over, adult: Secondary | ICD-10-CM

## 2020-11-20 DIAGNOSIS — E119 Type 2 diabetes mellitus without complications: Secondary | ICD-10-CM | POA: Diagnosis not present

## 2020-11-20 NOTE — Progress Notes (Signed)
Chief Complaint:   OBESITY Rhonda Aguilar is here to discuss her progress with her obesity treatment plan along with follow-up of her obesity related diagnoses. Rhonda Aguilar is on the Category 2 Plan and states she is following her eating plan approximately 10% of the time. Rhonda Aguilar states she is doing 0 minutes 0 times per week.  Today's visit was #: 92 Starting weight: 231 lbs Starting date: 12/08/2017 Today's weight: 228 lbs Today's date: 11/20/2020 Total lbs lost to date: 3 lbs Total lbs lost since last in-office visit: 1 lb  Interim History: Rhonda Aguilar is down 1 additional pound since her last visit. She is leaving for [place] in 2 weeks.   Subjective:   1. Type 2 diabetes mellitus with hyperlipidemia ([name]) Rhonda Aguilar is currently taking Jardiance. She stopped Ozempic. Her last A1C level was 6.0. Her GERD has improved.   2. Essential hypertension Rhonda Aguilar's blood pressure is controlled.  Assessment/Plan:   1. Type 2 diabetes mellitus with hyperlipidemia ([name]) Rhonda Aguilar will continue her medication. She will continue to decrease carbohydrates. Good blood sugar control is important to decrease the likelihood of diabetic complications such as nephropathy, neuropathy, limb loss, blindness, coronary artery disease, and death. Intensive lifestyle modification including diet, exercise and weight loss are the first line of treatment for diabetes.   2. Essential hypertension Rhonda Aguilar will continue medication. She is working on healthy weight loss and exercise to improve blood pressure control. We will watch for signs of hypotension as she continues her lifestyle modifications.  3. Obesity, current BMI 41.8 Rhonda Aguilar is currently in the action stage of change. As such, her goal is to continue with weight loss efforts. She has agreed to the Category 2 Plan.   Rhonda Aguilar will continue meal planning and mindful eating. We discussed travel tips for her upcoming visit.   Exercise goals: No exercise has been prescribed at this  time.  Behavioral modification strategies: increasing lean protein intake, decreasing simple carbohydrates, increasing vegetables, increasing water intake, decreasing eating out, no skipping meals, meal planning and cooking strategies, keeping healthy foods in the home, and planning for success.  Rhonda Aguilar has agreed to follow-up with our clinic in 4 weeks. She was informed of the importance of frequent follow-up visits to maximize her success with intensive lifestyle modifications for her multiple health conditions.   Objective:   Blood pressure 129/74, pulse 78, temperature 97.9 F (36.6 C), height 5\' 2"  (1.575 m), weight 228 lb (103.4 kg), SpO2 96 %. Body mass index is 41.7 kg/m.  General: Cooperative, alert, well developed, in no acute distress. HEENT: Conjunctivae and lids unremarkable. Cardiovascular: Regular rhythm.  Lungs: Normal work of breathing. Neurologic: No focal deficits.   Lab Results  Component Value Date   CREATININE 0.86 04/08/2020   BUN 20 04/08/2020   NA 137 04/08/2020   K 4.0 04/08/2020   CL 101 04/08/2020   CO2 21 04/08/2020   Lab Results  Component Value Date   ALT 58 (H) 04/08/2020   AST 41 (H) 04/08/2020   ALKPHOS 93 04/08/2020   BILITOT 0.8 04/08/2020   Lab Results  Component Value Date   HGBA1C 6.2 (H) 04/08/2020   HGBA1C 7.0 (H) 08/22/2019   HGBA1C 6.7 (H) 04/19/2019   Lab Results  Component Value Date   INSULIN 43.2 (H) 04/08/2020   INSULIN 33.6 (H) 08/22/2019   INSULIN 40.0 (H) 04/19/2019   INSULIN 40.9 (H) 04/12/2018   INSULIN 38.2 (H) 12/08/2017   Lab Results  Component Value Date   TSH  2.550 12/08/2017   Lab Results  Component Value Date   CHOL 227 (H) 04/08/2020   HDL 42 04/08/2020   LDLCALC 138 (H) 04/08/2020   TRIG 259 (H) 04/08/2020   CHOLHDL 5.4 (H) 04/08/2020   Lab Results  Component Value Date   VD25OH 46.6 04/08/2020   VD25OH 32.1 08/22/2019   VD25OH 26.2 (L) 04/19/2019   Lab Results  Component Value Date   WBC  7.6 09/05/2019   HGB 15.0 09/05/2019   HCT 47.0 (H) 09/05/2019   MCV 85.0 09/05/2019   PLT 228 09/05/2019   No results found for: IRON, TIBC, FERRITIN  Obesity Behavioral Intervention:   Approximately 15 minutes were spent on the discussion below.  ASK: We discussed the diagnosis of obesity with Rhonda Aguilar today and Rhonda Aguilar agreed to give [name] permission to discuss obesity behavioral modification therapy today.  ASSESS: Baileigh has the diagnosis of obesity and her BMI today is 41.8. Rhonda Aguilar is in the action stage of change.   ADVISE: Rhonda Aguilar was educated on the multiple health risks of obesity as well as the benefit of weight loss to improve her health. She was advised of the need for long term treatment and the importance of lifestyle modifications to improve her current health and to decrease her risk of future health problems.  AGREE: Multiple dietary modification options and treatment options were discussed and Rhonda Aguilar agreed to follow the recommendations documented in the above note.  ARRANGE: Rhonda Aguilar was educated on the importance of frequent visits to treat obesity as outlined per CMS and USPSTF guidelines and agreed to schedule her next follow up appointment today.  Attestation Statements:   Reviewed by clinician on day of visit: allergies, medications, problem list, medical history, surgical history, family history, social history, and previous encounter notes.  I, Rhonda Aguilar, Rhonda Aguilar, am acting as Location manager for [employer], DO.   I have reviewed the above documentation for accuracy and completeness, and I agree with the above. Jearld Lesch, DO

## 2020-11-22 ENCOUNTER — Other Ambulatory Visit (HOSPITAL_COMMUNITY): Payer: Self-pay

## 2020-11-24 ENCOUNTER — Other Ambulatory Visit (HOSPITAL_COMMUNITY): Payer: Self-pay

## 2020-12-16 ENCOUNTER — Other Ambulatory Visit (HOSPITAL_COMMUNITY): Payer: Self-pay

## 2020-12-17 ENCOUNTER — Other Ambulatory Visit (HOSPITAL_COMMUNITY): Payer: Self-pay

## 2020-12-17 ENCOUNTER — Encounter (INDEPENDENT_AMBULATORY_CARE_PROVIDER_SITE_OTHER): Payer: Self-pay | Admitting: Bariatrics

## 2020-12-17 ENCOUNTER — Ambulatory Visit (INDEPENDENT_AMBULATORY_CARE_PROVIDER_SITE_OTHER): Payer: Medicare HMO | Admitting: Bariatrics

## 2020-12-17 ENCOUNTER — Other Ambulatory Visit: Payer: Self-pay

## 2020-12-17 VITALS — BP 123/69 | HR 74 | Temp 97.9°F | Ht 62.0 in | Wt 226.0 lb

## 2020-12-17 DIAGNOSIS — E559 Vitamin D deficiency, unspecified: Secondary | ICD-10-CM | POA: Diagnosis not present

## 2020-12-17 DIAGNOSIS — I10 Essential (primary) hypertension: Secondary | ICD-10-CM

## 2020-12-17 DIAGNOSIS — Z6841 Body Mass Index (BMI) 40.0 and over, adult: Secondary | ICD-10-CM | POA: Diagnosis not present

## 2020-12-17 MED ORDER — VITAMIN D (ERGOCALCIFEROL) 1.25 MG (50000 UNIT) PO CAPS
ORAL_CAPSULE | ORAL | 0 refills | Status: DC
Start: 2020-12-17 — End: 2021-02-09
  Filled 2020-12-17: qty 12, 84d supply, fill #0

## 2020-12-17 NOTE — Progress Notes (Signed)
Chief Complaint:   OBESITY Rhonda Aguilar is here to discuss her progress with her obesity treatment plan along with follow-up of her obesity related diagnoses. Rhonda Aguilar is on the Category 2 Plan and states she is following her eating plan approximately 0% of the time. Rhonda Aguilar states she is walking 9,000-10,000 steps 7 times per.   Today's visit was #: 51 Starting weight: 231 lbs Starting date: 12/08/2017 Today's weight: 226 lbs Today's date: 12/17/2020 Total lbs lost to date: 5 Total lbs lost since last in-office visit: 2  Interim History: Rhonda Aguilar is down 2 additional pounds since her last visit. She has been out of the country ([place]  Subjective:   1. Vitamin D deficiency Rhonda Aguilar is taking Vit D as directed.   2. Essential hypertension Rhonda Aguilar's blood pressure is well controlled.  Assessment/Plan:   1. Vitamin D deficiency Low Vitamin D level contributes to fatigue and are associated with obesity, breast, and colon cancer. We will refill prescription Vitamin D for 90 days with no refills. Rhonda Aguilar will follow-up for routine testing of Vitamin D, at least 2-3 times per year to avoid over-replacement.  - Vitamin D, Ergocalciferol, (DRISDOL) 1.25 MG (50000 UNIT) CAPS capsule; TAKE 1 CAPSULE BY MOUTH ONCE A WEEK.  Dispense: 12 capsule; Refill: 0  2. Essential hypertension Rhonda Aguilar will continue her medications, and will continue working on healthy weight loss and exercise to improve blood pressure control. We will watch for signs of hypotension as she continues her lifestyle modifications.  3. Obesity, current BMI 41.1 Rhonda Aguilar is currently in the action stage of change. As such, her goal is to continue with weight loss efforts. She has agreed to the Category 2 Plan.   Rhonda Aguilar will adhere more closely to the plan. We will recheck fasting labs and IC at her next visit.  Exercise goals: As is.  Behavioral modification strategies: increasing lean protein intake, decreasing simple carbohydrates,  increasing vegetables, increasing water intake, decreasing eating out, no skipping meals, meal planning and cooking strategies, keeping healthy foods in the home, and planning for success.  Rhonda Aguilar has agreed to follow-up with our clinic in 3 to 4 weeks. She was informed of the importance of frequent follow-up visits to maximize her success with intensive lifestyle modifications for her multiple health conditions.   Objective:   Blood pressure 123/69, pulse 74, temperature 97.9 F (36.6 C), height 5\' 2"  (1.575 m), weight 226 lb (102.5 kg), SpO2 95 %. Body mass index is 41.34 kg/m.  General: Cooperative, alert, well developed, in no acute distress. HEENT: Conjunctivae and lids unremarkable. Cardiovascular: Regular rhythm.  Lungs: Normal work of breathing. Neurologic: No focal deficits.   Lab Results  Component Value Date   CREATININE 0.86 04/08/2020   BUN 20 04/08/2020   NA 137 04/08/2020   K 4.0 04/08/2020   CL 101 04/08/2020   CO2 21 04/08/2020   Lab Results  Component Value Date   ALT 58 (H) 04/08/2020   AST 41 (H) 04/08/2020   ALKPHOS 93 04/08/2020   BILITOT 0.8 04/08/2020   Lab Results  Component Value Date   HGBA1C 6.2 (H) 04/08/2020   HGBA1C 7.0 (H) 08/22/2019   HGBA1C 6.7 (H) 04/19/2019   Lab Results  Component Value Date   INSULIN 43.2 (H) 04/08/2020   INSULIN 33.6 (H) 08/22/2019   INSULIN 40.0 (H) 04/19/2019   INSULIN 40.9 (H) 04/12/2018   INSULIN 38.2 (H) 12/08/2017   Lab Results  Component Value Date   TSH 2.550 12/08/2017  Lab Results  Component Value Date   CHOL 227 (H) 04/08/2020   HDL 42 04/08/2020   LDLCALC 138 (H) 04/08/2020   TRIG 259 (H) 04/08/2020   CHOLHDL 5.4 (H) 04/08/2020   Lab Results  Component Value Date   VD25OH 46.6 04/08/2020   VD25OH 32.1 08/22/2019   VD25OH 26.2 (L) 04/19/2019   Lab Results  Component Value Date   WBC 7.6 09/05/2019   HGB 15.0 09/05/2019   HCT 47.0 (H) 09/05/2019   MCV 85.0 09/05/2019   PLT 228  09/05/2019   No results found for: IRON, TIBC, FERRITIN  Attestation Statements:   Reviewed by clinician on day of visit: allergies, medications, problem list, medical history, surgical history, family history, social history, and previous encounter notes.   [name], am acting as Location manager for [employer], DO.  I have reviewed the above documentation for accuracy and completeness, and I agree with the above. Jearld Lesch, DO

## 2020-12-18 ENCOUNTER — Encounter (INDEPENDENT_AMBULATORY_CARE_PROVIDER_SITE_OTHER): Payer: Self-pay | Admitting: Bariatrics

## 2020-12-22 ENCOUNTER — Other Ambulatory Visit (HOSPITAL_COMMUNITY): Payer: Self-pay

## 2020-12-22 MED ORDER — VALSARTAN 320 MG PO TABS
320.0000 mg | ORAL_TABLET | Freq: Every day | ORAL | 3 refills | Status: DC
  Filled 2020-12-22: qty 90, 90d supply, fill #0
  Filled 2021-03-18: qty 90, 90d supply, fill #1
  Filled 2021-06-25: qty 90, 90d supply, fill #2
  Filled 2021-09-14: qty 90, 90d supply, fill #3

## 2020-12-23 ENCOUNTER — Other Ambulatory Visit (HOSPITAL_COMMUNITY): Payer: Self-pay

## 2020-12-26 DIAGNOSIS — Z0100 Encounter for examination of eyes and vision without abnormal findings: Secondary | ICD-10-CM | POA: Diagnosis not present

## 2020-12-26 DIAGNOSIS — G4733 Obstructive sleep apnea (adult) (pediatric): Secondary | ICD-10-CM | POA: Diagnosis not present

## 2020-12-26 DIAGNOSIS — G4730 Sleep apnea, unspecified: Secondary | ICD-10-CM | POA: Diagnosis not present

## 2021-01-13 ENCOUNTER — Other Ambulatory Visit (HOSPITAL_COMMUNITY): Payer: Self-pay

## 2021-01-13 ENCOUNTER — Ambulatory Visit (INDEPENDENT_AMBULATORY_CARE_PROVIDER_SITE_OTHER): Payer: Medicare HMO | Admitting: Bariatrics

## 2021-01-16 ENCOUNTER — Other Ambulatory Visit (HOSPITAL_COMMUNITY): Payer: Self-pay

## 2021-01-20 ENCOUNTER — Other Ambulatory Visit (HOSPITAL_COMMUNITY): Payer: Self-pay

## 2021-01-20 MED ORDER — CLOBETASOL PROPIONATE 0.05 % EX OINT
TOPICAL_OINTMENT | CUTANEOUS | 1 refills | Status: DC
  Filled 2021-01-20: qty 15, 30d supply, fill #0
  Filled 2021-04-15: qty 15, 30d supply, fill #1

## 2021-01-21 ENCOUNTER — Other Ambulatory Visit (HOSPITAL_COMMUNITY): Payer: Self-pay

## 2021-01-22 ENCOUNTER — Other Ambulatory Visit (HOSPITAL_COMMUNITY): Payer: Self-pay

## 2021-01-25 DIAGNOSIS — G4730 Sleep apnea, unspecified: Secondary | ICD-10-CM | POA: Diagnosis not present

## 2021-01-25 DIAGNOSIS — G4733 Obstructive sleep apnea (adult) (pediatric): Secondary | ICD-10-CM | POA: Diagnosis not present

## 2021-01-26 ENCOUNTER — Other Ambulatory Visit (HOSPITAL_COMMUNITY): Payer: Self-pay

## 2021-01-26 MED FILL — Glucose Blood Test Strip: 90 days supply | Qty: 200 | Fill #0 | Status: CN

## 2021-02-02 ENCOUNTER — Other Ambulatory Visit (HOSPITAL_COMMUNITY): Payer: Self-pay

## 2021-02-09 ENCOUNTER — Other Ambulatory Visit (HOSPITAL_COMMUNITY): Payer: Self-pay

## 2021-02-09 ENCOUNTER — Ambulatory Visit (INDEPENDENT_AMBULATORY_CARE_PROVIDER_SITE_OTHER): Payer: Medicare HMO | Admitting: Bariatrics

## 2021-02-09 ENCOUNTER — Other Ambulatory Visit: Payer: Self-pay

## 2021-02-09 ENCOUNTER — Encounter (INDEPENDENT_AMBULATORY_CARE_PROVIDER_SITE_OTHER): Payer: Self-pay | Admitting: Bariatrics

## 2021-02-09 VITALS — BP 121/72 | HR 72 | Temp 98.1°F | Ht 62.0 in | Wt 229.0 lb

## 2021-02-09 DIAGNOSIS — R748 Abnormal levels of other serum enzymes: Secondary | ICD-10-CM | POA: Diagnosis not present

## 2021-02-09 DIAGNOSIS — E1169 Type 2 diabetes mellitus with other specified complication: Secondary | ICD-10-CM

## 2021-02-09 DIAGNOSIS — E785 Hyperlipidemia, unspecified: Secondary | ICD-10-CM | POA: Diagnosis not present

## 2021-02-09 DIAGNOSIS — E559 Vitamin D deficiency, unspecified: Secondary | ICD-10-CM

## 2021-02-09 DIAGNOSIS — Z6841 Body Mass Index (BMI) 40.0 and over, adult: Secondary | ICD-10-CM | POA: Diagnosis not present

## 2021-02-09 DIAGNOSIS — R0602 Shortness of breath: Secondary | ICD-10-CM | POA: Diagnosis not present

## 2021-02-09 MED ORDER — VITAMIN D (ERGOCALCIFEROL) 1.25 MG (50000 UNIT) PO CAPS
ORAL_CAPSULE | ORAL | 0 refills | Status: DC
  Filled 2021-02-09: qty 12, fill #0

## 2021-02-09 NOTE — Progress Notes (Signed)
Chief Complaint:   OBESITY Rhonda Aguilar is here to discuss her progress with her obesity treatment plan along with follow-up of her obesity related diagnoses. Rhonda Aguilar is on the Category 2 Plan and states she is following her eating plan approximately 20% of the time. Rhonda Aguilar states she is doing 0 minutes 0 times per week.  Today's visit was #: 66 Starting weight: 231 lbs Starting date: 12/08/2017 Today's weight: 229 lbs Today's date: 02/09/2021 Total lbs lost to date: 2 lbs Total lbs lost since last in-office visit: 0  Interim History: Rhonda Aguilar is up 3 lbs since her last visit. Her appetite is up after she stopped the Ozempic due to reflux. She has eaten slightly more during the holidays.  Subjective:   1. SOBOE (shortness of breath on exertion) and other fatigue Rhonda Aguilar notes increasing shortness of breath with exercising and seems to be worsening over time with weight gain. She notes getting out of breath sooner with activity than she used to. This has gotten worse recently. Rhonda Aguilar denies shortness of breath at rest or orthopnea.   2. Vitamin D deficiency Rhonda Aguilar is taking her medications as directed.  3. Elevated liver enzymes Rhonda Aguilar's AST=41 her ACT=58.  4. Low serum HDL Rhonda Aguilar is taking Crestor currently.  5. Type 2 diabetes mellitus with hyperlipidemia (Rhonda Aguilar) Rhonda Aguilar last A1C 04-08-2020 was 6.2.   Assessment/Plan:   1. SOBOE (shortness of breath on exertion) and other fatigue Rudean will consider journaling.   2. Vitamin D deficiency Low Vitamin D level contributes to fatigue and are associated with obesity, breast, and colon cancer. We will refill prescription Vitamin D 50,000 IU every week for 1 month with no refills and Estella will follow-up for routine testing of Vitamin D, at least 2-3 times per year to avoid over-replacement. We will check Vitamin D today.  - Vitamin D, Ergocalciferol, (DRISDOL) 1.25 MG (50000 UNIT) CAPS capsule; TAKE 1 CAPSULE BY MOUTH ONCE A WEEK.  Dispense: 12  capsule; Refill: 0 - VITAMIN D 25 Hydroxy (Vit-D Deficiency, Fractures)  3. Elevated liver enzymes We discussed the likely diagnosis of non-alcoholic fatty liver disease today and how this condition is obesity related. Hanae was educated the importance of weight loss. Levonia agreed to continue with her weight loss efforts with healthier diet and exercise as an essential part of her treatment plan.  - Comprehensive metabolic panel  4. Low serum HDL We will check Lipids. Moorea will continue Crestor.  - Lipid panel  5. Type 2 diabetes mellitus with hyperlipidemia ([name]) We will check A1C and insulin today. Magdalina will consider Qysmia and/or Contrave.  Good blood sugar control is important to decrease the likelihood of diabetic complications such as nephropathy, neuropathy, limb loss, blindness, coronary artery disease, and death. Intensive lifestyle modification including diet, exercise and weight loss are the first line of treatment for diabetes.   - Hemoglobin A1c - Insulin, random  6. Obesity, current BMI 42.0 Shandiin is currently in the action stage of change. As such, her goal is to continue with weight loss efforts. She has agreed to the Category 2 Plan and keeping a food journal and adhering to recommended goals of 1200 calories and 80-90 grams of protein.   Exercise goals:  Latonda will do more NEAT exercises.  Behavioral modification strategies: increasing lean protein intake, decreasing simple carbohydrates, increasing vegetables, increasing water intake, decreasing eating out, no skipping meals, meal planning and cooking strategies, keeping healthy foods in the home, and planning for success.  Shamekia has  agreed to follow-up with our clinic in 3-4 weeks(fasting). She was informed of the importance of frequent follow-up visits to maximize her success with intensive lifestyle modifications for her multiple health conditions.   Bertrice was informed we would discuss her lab results at her next  visit unless there is a critical issue that needs to be addressed sooner. Mardie agreed to keep her next visit at the agreed upon time to discuss these results.  Objective:   Blood pressure 121/72, pulse 72, temperature 98.1 F (36.7 C), height 5\' 2"  (1.575 m), weight 229 lb (103.9 kg), SpO2 96 %. Body mass index is 41.88 kg/m.  General: Cooperative, alert, well developed, in no acute distress. HEENT: Conjunctivae and lids unremarkable. Cardiovascular: Regular rhythm.  Lungs: Normal work of breathing. Neurologic: No focal deficits.   Lab Results  Component Value Date   CREATININE 0.86 04/08/2020   BUN 20 04/08/2020   NA 137 04/08/2020   K 4.0 04/08/2020   CL 101 04/08/2020   CO2 21 04/08/2020   Lab Results  Component Value Date   ALT 58 (H) 04/08/2020   AST 41 (H) 04/08/2020   ALKPHOS 93 04/08/2020   BILITOT 0.8 04/08/2020   Lab Results  Component Value Date   HGBA1C 6.2 (H) 04/08/2020   HGBA1C 7.0 (H) 08/22/2019   HGBA1C 6.7 (H) 04/19/2019   Lab Results  Component Value Date   INSULIN 43.2 (H) 04/08/2020   INSULIN 33.6 (H) 08/22/2019   INSULIN 40.0 (H) 04/19/2019   INSULIN 40.9 (H) 04/12/2018   INSULIN 38.2 (H) 12/08/2017   Lab Results  Component Value Date   TSH 2.550 12/08/2017   Lab Results  Component Value Date   CHOL 227 (H) 04/08/2020   HDL 42 04/08/2020   LDLCALC 138 (H) 04/08/2020   TRIG 259 (H) 04/08/2020   CHOLHDL 5.4 (H) 04/08/2020   Lab Results  Component Value Date   VD25OH 46.6 04/08/2020   VD25OH 32.1 08/22/2019   VD25OH 26.2 (L) 04/19/2019   Lab Results  Component Value Date   WBC 7.6 09/05/2019   HGB 15.0 09/05/2019   HCT 47.0 (H) 09/05/2019   MCV 85.0 09/05/2019   PLT 228 09/05/2019   No results found for: IRON, TIBC, FERRITIN  Attestation Statements:   Reviewed by clinician on day of visit: allergies, medications, problem list, medical history, surgical history, family history, social history, and previous encounter  notes.  I, Lizbeth Bark, RMA, am acting as Location manager for [employer], DO.  I have reviewed the above documentation for accuracy and completeness, and I agree with the above. Jearld Lesch, DO

## 2021-02-10 ENCOUNTER — Other Ambulatory Visit: Payer: Self-pay | Admitting: Family Medicine

## 2021-02-10 ENCOUNTER — Encounter (INDEPENDENT_AMBULATORY_CARE_PROVIDER_SITE_OTHER): Payer: Self-pay | Admitting: Bariatrics

## 2021-02-10 DIAGNOSIS — Z1231 Encounter for screening mammogram for malignant neoplasm of breast: Secondary | ICD-10-CM

## 2021-02-10 LAB — LIPID PANEL
Chol/HDL Ratio: 3.6 ratio (ref 0.0–4.4)
Cholesterol, Total: 177 mg/dL (ref 100–199)
HDL: 49 mg/dL (ref >39)
LDL Chol Calc (NIH): 99 mg/dL (ref 0–99)
Triglycerides: 168 mg/dL — ABNORMAL HIGH (ref 0–149)
VLDL Cholesterol Cal: 29 mg/dL (ref 5–40)

## 2021-02-10 LAB — COMPREHENSIVE METABOLIC PANEL
ALT: 47 IU/L — ABNORMAL HIGH (ref 0–32)
AST: 37 IU/L (ref 0–40)
Albumin/Globulin Ratio: 2.2 (ref 1.2–2.2)
Albumin: 4.3 g/dL (ref 3.8–4.8)
Alkaline Phosphatase: 81 IU/L (ref 44–121)
BUN/Creatinine Ratio: 26 (ref 12–28)
BUN: 21 mg/dL (ref 8–27)
Bilirubin Total: 0.4 mg/dL (ref 0.0–1.2)
CO2: 22 mmol/L (ref 20–29)
Calcium: 8.9 mg/dL (ref 8.7–10.3)
Chloride: 103 mmol/L (ref 96–106)
Creatinine, Ser: 0.81 mg/dL (ref 0.57–1.00)
Globulin, Total: 2 g/dL (ref 1.5–4.5)
Glucose: 123 mg/dL — ABNORMAL HIGH (ref 70–99)
Potassium: 4.1 mmol/L (ref 3.5–5.2)
Sodium: 140 mmol/L (ref 134–144)
Total Protein: 6.3 g/dL (ref 6.0–8.5)
eGFR: 80 mL/min/{1.73_m2} (ref >59)

## 2021-02-10 LAB — HEMOGLOBIN A1C
Est. average glucose Bld gHb Est-mCnc: 146 mg/dL
Hgb A1c MFr Bld: 6.7 % — ABNORMAL HIGH (ref 4.8–5.6)

## 2021-02-10 LAB — INSULIN, RANDOM: INSULIN: 29.1 u[IU]/mL — ABNORMAL HIGH (ref 2.6–24.9)

## 2021-02-10 LAB — VITAMIN D 25 HYDROXY (VIT D DEFICIENCY, FRACTURES): Vit D, 25-Hydroxy: 42.3 ng/mL (ref 30.0–100.0)

## 2021-02-12 ENCOUNTER — Other Ambulatory Visit (HOSPITAL_COMMUNITY): Payer: Self-pay

## 2021-02-17 ENCOUNTER — Other Ambulatory Visit (HOSPITAL_COMMUNITY): Payer: Self-pay

## 2021-02-19 ENCOUNTER — Other Ambulatory Visit (HOSPITAL_COMMUNITY): Payer: Self-pay

## 2021-02-20 ENCOUNTER — Other Ambulatory Visit (HOSPITAL_COMMUNITY): Payer: Self-pay

## 2021-02-25 DIAGNOSIS — G4730 Sleep apnea, unspecified: Secondary | ICD-10-CM | POA: Diagnosis not present

## 2021-02-25 DIAGNOSIS — G4733 Obstructive sleep apnea (adult) (pediatric): Secondary | ICD-10-CM | POA: Diagnosis not present

## 2021-03-09 ENCOUNTER — Encounter (INDEPENDENT_AMBULATORY_CARE_PROVIDER_SITE_OTHER): Payer: Self-pay | Admitting: Bariatrics

## 2021-03-09 ENCOUNTER — Ambulatory Visit (INDEPENDENT_AMBULATORY_CARE_PROVIDER_SITE_OTHER): Payer: Medicare HMO | Admitting: Bariatrics

## 2021-03-09 ENCOUNTER — Other Ambulatory Visit (HOSPITAL_COMMUNITY): Payer: Self-pay

## 2021-03-09 ENCOUNTER — Other Ambulatory Visit: Payer: Self-pay

## 2021-03-09 VITALS — BP 139/81 | HR 69 | Temp 97.5°F | Ht 62.0 in | Wt 228.0 lb

## 2021-03-09 DIAGNOSIS — Z6841 Body Mass Index (BMI) 40.0 and over, adult: Secondary | ICD-10-CM | POA: Diagnosis not present

## 2021-03-09 DIAGNOSIS — E1169 Type 2 diabetes mellitus with other specified complication: Secondary | ICD-10-CM

## 2021-03-09 DIAGNOSIS — R632 Polyphagia: Secondary | ICD-10-CM | POA: Diagnosis not present

## 2021-03-09 DIAGNOSIS — E785 Hyperlipidemia, unspecified: Secondary | ICD-10-CM | POA: Diagnosis not present

## 2021-03-09 DIAGNOSIS — I10 Essential (primary) hypertension: Secondary | ICD-10-CM

## 2021-03-10 ENCOUNTER — Encounter (INDEPENDENT_AMBULATORY_CARE_PROVIDER_SITE_OTHER): Payer: Self-pay | Admitting: Bariatrics

## 2021-03-10 NOTE — Progress Notes (Signed)
Chief Complaint:   OBESITY Rhonda Aguilar is here to discuss her progress with her obesity treatment plan along with follow-up of her obesity related diagnoses. [name] is on the Category 2 Plan and states she is following her eating plan approximately 20% of the time. [name] states she is doing 0 minutes 0 times per week.  Today's visit was #: 61 Starting weight: 231 lbs Starting date: 12/08/2017 Today's weight: 228 lbs Today's date: 03/10/2021 Total lbs lost to date: 3 lbs Total lbs lost since last in-office visit: 1 lb  Interim History: Rhonda Aguilar is down 1 additional pound over the holidays. She tried over the holidays to eat and drink better.  Subjective:   1. Essential hypertension Rhonda Aguilar is currently taking HCTZ, Diovan and Verapamil.   2. Hyperlipidemia associated with type 2 diabetes mellitus ([place]) Rhonda Aguilar is taking Zetia and Crestor currently.  3. Type 2 diabetes mellitus with hyperlipidemia ([place]) Rhonda Aguilar had tried Ozempic but taking Jadiance. Her last A1C was 6.7. Her insulin was 23.1. Her fasting blood sugar was in the range 130s.  4. Polyphagia Rhonda Aguilar cannot tolerate Metformin, Saxenda and Ozempic all caused gastrointestinal  issues.  Assessment/Plan:   1. Essential hypertension Rhonda Aguilar will continue her medications. She will have no added salt. She is working on healthy weight loss and exercise to improve blood pressure control. We will watch for signs of hypotension as she continues her lifestyle modifications.  2. Hyperlipidemia associated with type 2 diabetes mellitus ([place]) Cardiovascular risk and specific lipid/LDL goals reviewed.  Rhonda Aguilar will have no trans fats, she will keep saturated fats to a minium except whole dairy and processed meat and dark chocolate. We discussed several lifestyle modifications today and Rhonda Aguilar will continue to work on diet, exercise and weight loss efforts. Orders and follow up as documented in patient record.   Counseling Intensive lifestyle  modifications are the first line treatment for this issue. Dietary changes: Increase soluble fiber. Decrease simple carbohydrates. Exercise changes: Moderate to vigorous-intensity aerobic activity 150 minutes per week if tolerated. Lipid-lowering medications: see documented in medical record.  3. Type 2 diabetes mellitus with hyperlipidemia ([name]) Rhonda Aguilar will continue her medications. Good blood sugar control is important to decrease the likelihood of diabetic complications such as nephropathy, neuropathy, limb loss, blindness, coronary artery disease, and death. Intensive lifestyle modification including diet, exercise and weight loss are the first line of treatment for diabetes.   4. Polyphagia Discussed continuing Intensive lifestyle modifications are the first line treatment for this issue. We discussed several lifestyle modifications today and she will continue to work on diet, exercise and weight loss efforts. Orders and follow up as documented in patient record.  Counseling Polyphagia is excessive hunger. Causes can include: low blood sugars, hypERthyroidism, PMS, lack of sleep, stress, insulin resistance, diabetes, certain medications, and diets that are deficient in protein and fiber.    5. Obesity, current BMI 41.7 Rhonda Aguilar is currently in the action stage of change. As such, her goal is to continue with weight loss efforts. She has agreed to keeping a food journal and adhering to recommended goals of 1200 calories and 80 grams of protein.   Elysia will adhere to the plan 80-90%. She will continue meal planning and she will take her lunch.  Exercise goals: [name] will walk at work counting steps and walking  10 minutes a everyday.   Behavioral modification strategies: increasing lean protein intake, decreasing simple carbohydrates, increasing vegetables, increasing water intake, decreasing eating out, no skipping meals, meal planning  and cooking strategies, keeping healthy foods in the home,  and planning for success.  Rhonda Aguilar has agreed to follow-up with our clinic in 4 weeks. She was informed of the importance of frequent follow-up visits to maximize her success with intensive lifestyle modifications for her multiple health conditions.   Objective:   Blood pressure 139/81, pulse 69, temperature (!) 97.5 F (36.4 C), height 5\' 2"  (1.575 m), weight 228 lb (103.4 kg), SpO2 97 %. Body mass index is 41.7 kg/m.  General: Cooperative, alert, well developed, in no acute distress. HEENT: Conjunctivae and lids unremarkable. Cardiovascular: Regular rhythm.  Lungs: Normal work of breathing. Neurologic: No focal deficits.   Lab Results  Component Value Date   CREATININE 0.81 02/09/2021   BUN 21 02/09/2021   NA 140 02/09/2021   K 4.1 02/09/2021   CL 103 02/09/2021   CO2 22 02/09/2021   Lab Results  Component Value Date   ALT 47 (H) 02/09/2021   AST 37 02/09/2021   ALKPHOS 81 02/09/2021   BILITOT 0.4 02/09/2021   Lab Results  Component Value Date   HGBA1C 6.7 (H) 02/09/2021   HGBA1C 6.2 (H) 04/08/2020   HGBA1C 7.0 (H) 08/22/2019   HGBA1C 6.7 (H) 04/19/2019   Lab Results  Component Value Date   INSULIN 29.1 (H) 02/09/2021   INSULIN 43.2 (H) 04/08/2020   INSULIN 33.6 (H) 08/22/2019   INSULIN 40.0 (H) 04/19/2019   INSULIN 40.9 (H) 04/12/2018   Lab Results  Component Value Date   TSH 2.550 12/08/2017   Lab Results  Component Value Date   CHOL 177 02/09/2021   HDL 49 02/09/2021   LDLCALC 99 02/09/2021   TRIG 168 (H) 02/09/2021   CHOLHDL 3.6 02/09/2021   Lab Results  Component Value Date   VD25OH 42.3 02/09/2021   VD25OH 46.6 04/08/2020   VD25OH 32.1 08/22/2019   Lab Results  Component Value Date   WBC 7.6 09/05/2019   HGB 15.0 09/05/2019   HCT 47.0 (H) 09/05/2019   MCV 85.0 09/05/2019   PLT 228 09/05/2019   No results found for: IRON, TIBC, FERRITIN  Attestation Statements:   Reviewed by clinician on day of visit: allergies, medications, problem  list, medical history, surgical history, family history, social history, and previous encounter notes.  I, Lizbeth Bark, RMA, am acting as Location manager for [employer], DO.  I have reviewed the above documentation for accuracy and completeness, and I agree with the above. Jearld Lesch, DO

## 2021-03-13 ENCOUNTER — Ambulatory Visit
Admission: RE | Admit: 2021-03-13 | Discharge: 2021-03-13 | Disposition: A | Discharge status: Home / Self Care | Payer: Medicare HMO | Source: Ambulatory Visit | Attending: Family Medicine | Admitting: Family Medicine

## 2021-03-13 DIAGNOSIS — Z1231 Encounter for screening mammogram for malignant neoplasm of breast: Secondary | ICD-10-CM

## 2021-03-18 ENCOUNTER — Other Ambulatory Visit (HOSPITAL_COMMUNITY): Payer: Self-pay

## 2021-03-27 ENCOUNTER — Other Ambulatory Visit (HOSPITAL_COMMUNITY): Payer: Self-pay

## 2021-03-31 DIAGNOSIS — G4730 Sleep apnea, unspecified: Secondary | ICD-10-CM | POA: Diagnosis not present

## 2021-03-31 DIAGNOSIS — G4733 Obstructive sleep apnea (adult) (pediatric): Secondary | ICD-10-CM | POA: Diagnosis not present

## 2021-04-07 ENCOUNTER — Encounter (INDEPENDENT_AMBULATORY_CARE_PROVIDER_SITE_OTHER): Payer: Self-pay | Admitting: Bariatrics

## 2021-04-07 ENCOUNTER — Other Ambulatory Visit: Payer: Self-pay

## 2021-04-07 ENCOUNTER — Ambulatory Visit (INDEPENDENT_AMBULATORY_CARE_PROVIDER_SITE_OTHER): Payer: Medicare HMO | Admitting: Bariatrics

## 2021-04-07 ENCOUNTER — Other Ambulatory Visit (HOSPITAL_COMMUNITY): Payer: Self-pay

## 2021-04-07 VITALS — BP 123/84 | HR 66 | Temp 97.9°F | Ht 62.0 in | Wt 230.0 lb

## 2021-04-07 DIAGNOSIS — E559 Vitamin D deficiency, unspecified: Secondary | ICD-10-CM

## 2021-04-07 DIAGNOSIS — E785 Hyperlipidemia, unspecified: Secondary | ICD-10-CM | POA: Diagnosis not present

## 2021-04-07 DIAGNOSIS — E669 Obesity, unspecified: Secondary | ICD-10-CM | POA: Diagnosis not present

## 2021-04-07 DIAGNOSIS — Z6841 Body Mass Index (BMI) 40.0 and over, adult: Secondary | ICD-10-CM

## 2021-04-07 DIAGNOSIS — E1169 Type 2 diabetes mellitus with other specified complication: Secondary | ICD-10-CM

## 2021-04-07 MED ORDER — VITAMIN D (ERGOCALCIFEROL) 1.25 MG (50000 UNIT) PO CAPS
ORAL_CAPSULE | ORAL | 0 refills | Status: DC
  Filled 2021-04-07: qty 12, 84d supply, fill #0

## 2021-04-07 NOTE — Progress Notes (Signed)
Chief Complaint:   OBESITY Rhonda Aguilar is here to discuss her progress with her obesity treatment plan along with follow-up of her obesity related diagnoses. Rhonda Aguilar is on the Category 2 Plan and states she is following her eating plan approximately 20% of the time. Rhonda Aguilar states she is walking more.  Today's visit was #: 28 Starting weight: 231 lbs Starting date: 12/08/2017 Today's weight: 230 lbs Today's date: 04/07/2021 Total lbs lost to date: 0 Total lbs lost since last in-office visit: 0  Interim History: Rhonda Aguilar is up 2 lbs since her last visit and has had some gastrointestinal issues (acute). She has more energy and accomplished more.   Subjective:   1. Vitamin D deficiency Rhonda Aguilar is taking her Vitamin D as directed.   2. Hyperlipidemia associated with type 2 diabetes mellitus (Rhonda Aguilar) Rhonda Aguilar is taking Crestor currently. She denies myalgia.   Assessment/Plan:   1. Vitamin D deficiency Low Vitamin D level contributes to fatigue and are associated with obesity, breast, and colon cancer. We will refill prescription Vitamin D 50,000 IU every week for 1 month with no refills and Rhonda Aguilar will follow-up for routine testing of Vitamin D, at least 2-3 times per year to avoid over-replacement.  - Vitamin D, Ergocalciferol, (DRISDOL) 1.25 MG (50000 UNIT) CAPS capsule; TAKE 1 CAPSULE BY MOUTH ONCE A WEEK.  Dispense: 12 capsule; Refill: 0  2. Hyperlipidemia associated with type 2 diabetes mellitus (Rhonda Aguilar) Cardiovascular risk and specific lipid/LDL goals reviewed.  Rhonda Aguilar will continue taking Crestor. We discussed several lifestyle modifications today and Rhonda Aguilar will continue to work on diet, exercise and weight loss efforts. Orders and follow up as documented in patient record.   Counseling Intensive lifestyle modifications are the first line treatment for this issue. Dietary changes: Increase soluble fiber. Decrease simple carbohydrates. Exercise changes: Moderate to vigorous-intensity aerobic  activity 150 minutes per week if tolerated. Lipid-lowering medications: see documented in medical record.  3. Obesity, current BMI 42.2 Rhonda Aguilar is currently in the action stage of change. As such, her goal is to continue with weight loss efforts. She has agreed to the Category 2 Plan and keeping a food journal and adhering to recommended goals of 1200 calories and 80 grams of protein.   Rhonda Aguilar will continue meal planning and will adhere more closely to the plan. She will increase protein. She will begin journaling.   Exercise goals:  Rhonda Aguilar will add more exercise.  Behavioral modification strategies: increasing lean protein intake, decreasing simple carbohydrates, increasing vegetables, increasing water intake, decreasing eating out, no skipping meals, meal planning and cooking strategies, keeping healthy foods in the home, and planning for success.  Rhonda Aguilar has agreed to follow-up with our clinic in 3-4 weeks. She was informed of the importance of frequent follow-up visits to maximize her success with intensive lifestyle modifications for her multiple health conditions.   Objective:   Blood pressure 123/84, pulse 66, temperature 97.9 F (36.6 C), height 5\' 2"  (1.575 m), weight 230 lb (104.3 kg), SpO2 96 %. Body mass index is 42.07 kg/m.  General: Cooperative, alert, well developed, in no acute distress. HEENT: Conjunctivae and lids unremarkable. Cardiovascular: Regular rhythm.  Lungs: Normal work of breathing. Neurologic: No focal deficits.   Lab Results  Component Value Date   CREATININE 0.81 02/09/2021   BUN 21 02/09/2021   NA 140 02/09/2021   K 4.1 02/09/2021   CL 103 02/09/2021   CO2 22 02/09/2021   Lab Results  Component Value Date   ALT 47 (H)  02/09/2021   AST 37 02/09/2021   ALKPHOS 81 02/09/2021   BILITOT 0.4 02/09/2021   Lab Results  Component Value Date   HGBA1C 6.7 (H) 02/09/2021   HGBA1C 6.2 (H) 04/08/2020   HGBA1C 7.0 (H) 08/22/2019   HGBA1C 6.7 (H) 04/19/2019    Lab Results  Component Value Date   INSULIN 29.1 (H) 02/09/2021   INSULIN 43.2 (H) 04/08/2020   INSULIN 33.6 (H) 08/22/2019   INSULIN 40.0 (H) 04/19/2019   INSULIN 40.9 (H) 04/12/2018   Lab Results  Component Value Date   TSH 2.550 12/08/2017   Lab Results  Component Value Date   CHOL 177 02/09/2021   HDL 49 02/09/2021   LDLCALC 99 02/09/2021   TRIG 168 (H) 02/09/2021   CHOLHDL 3.6 02/09/2021   Lab Results  Component Value Date   VD25OH 42.3 02/09/2021   VD25OH 46.6 04/08/2020   VD25OH 32.1 08/22/2019   Lab Results  Component Value Date   WBC 7.6 09/05/2019   HGB 15.0 09/05/2019   HCT 47.0 (H) 09/05/2019   MCV 85.0 09/05/2019   PLT 228 09/05/2019   No results found for: IRON, TIBC, FERRITIN  Attestation Statements:   Reviewed by clinician on day of visit: allergies, medications, problem list, medical history, surgical history, family history, social history, and previous encounter notes.  I, Lizbeth Bark, RMA, am acting as Location manager for [employer], DO.  I have reviewed the above documentation for accuracy and completeness, and I agree with the above. Jearld Lesch, DO

## 2021-04-08 ENCOUNTER — Encounter (INDEPENDENT_AMBULATORY_CARE_PROVIDER_SITE_OTHER): Payer: Self-pay | Admitting: Bariatrics

## 2021-04-15 ENCOUNTER — Other Ambulatory Visit (HOSPITAL_COMMUNITY): Payer: Self-pay

## 2021-04-16 ENCOUNTER — Other Ambulatory Visit (HOSPITAL_COMMUNITY): Payer: Self-pay

## 2021-04-16 IMAGING — CT CT HEART MORP W/ CTA COR W/ SCORE W/ CA W/CM &/OR W/O CM
4 of 7 series · 8 of 20 positions shown, 9 images · non-contrast
Comparison: None.
COMPARISON: None.

Addendum:
EXAM:
OVER-READ INTERPRETATION  CT CHEST

The following report is an over-read performed by radiologist Dr.
Roohalla Muhabat [REDACTED] on 11/19/2019. This over-read
does not include interpretation of cardiac or coronary anatomy or
pathology. The coronary CTA interpretation by the cardiologist is
attached.
TECHNIQUE: The patient was scanned on a Phillips Force scanner.

[Series 6: best diast 75 % · axial · 0.39mm/px · z∈[+1000,+1039]mm · 2 of 298 slices shown, 3 images]
[im 100/298  vessel]
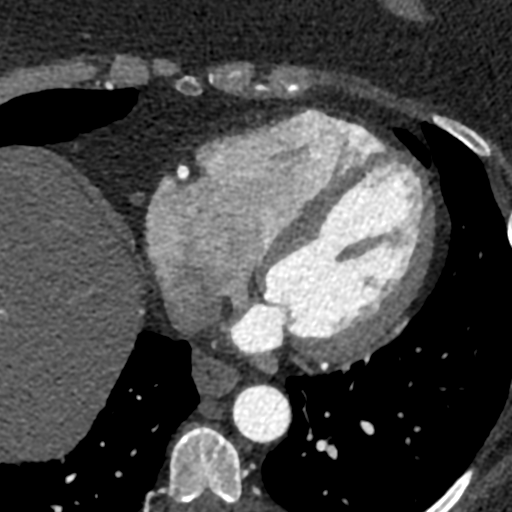
[im 100/298  lung]
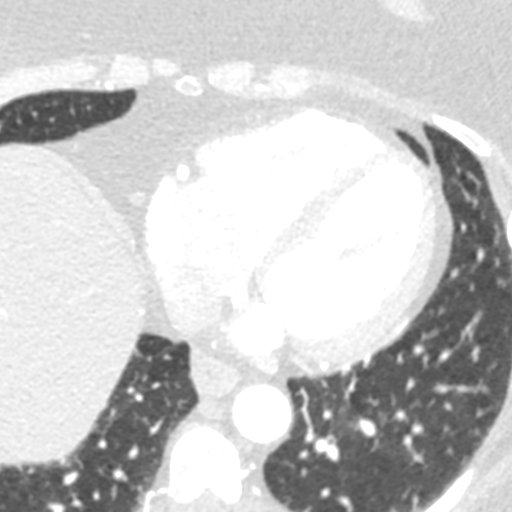
[im 199/298  vessel]
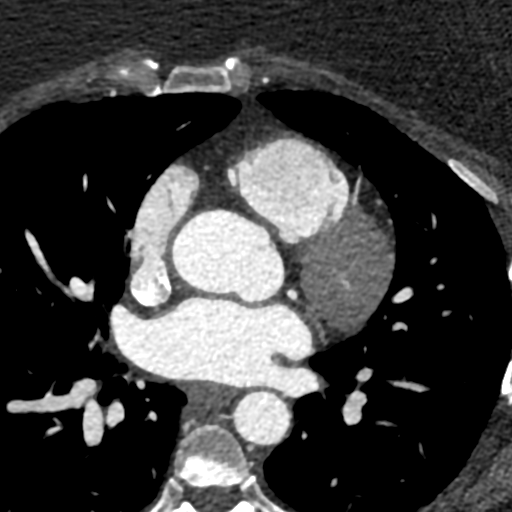

[Series 7: best syst 40 % · axial · 0.39mm/px · z∈[+1000,+1039]mm · 2 of 298 slices shown]
[im 100/298  vessel]
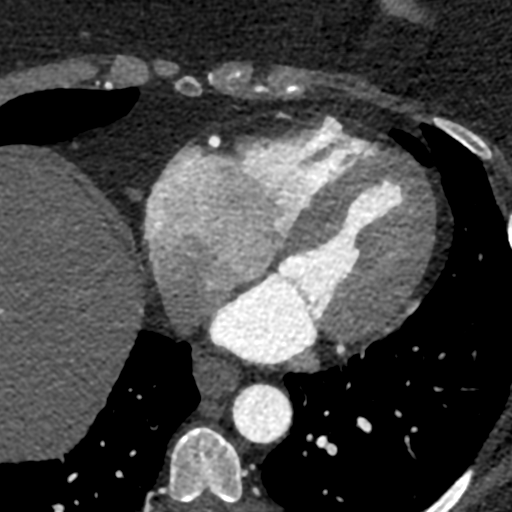
[im 199/298  vessel]
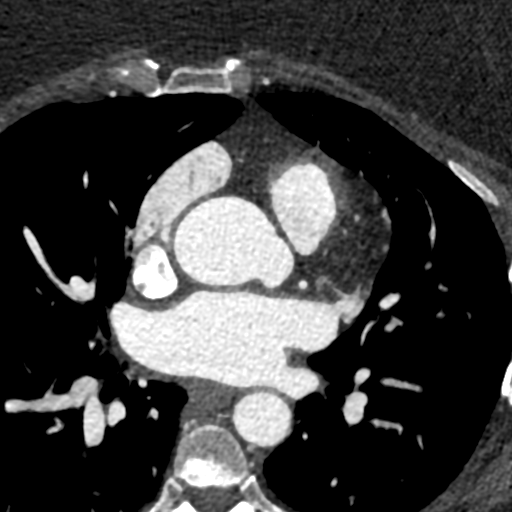

[Series 8: ts diast sharp 75 % · axial · 0.39mm/px · z∈[+1000,+1039]mm · 2 of 298 slices shown]
[im 100/298  lung]
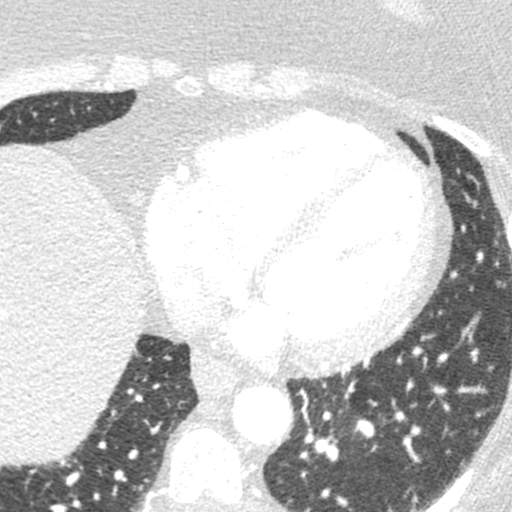
[im 199/298  lung]
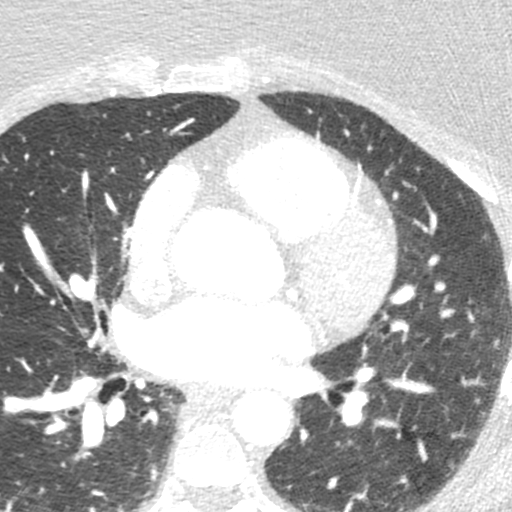

[Series 9: ts syst sharp 40 % · axial · 0.39mm/px · z∈[+1000,+1039]mm · 2 of 298 slices shown]
[im 100/298  lung]
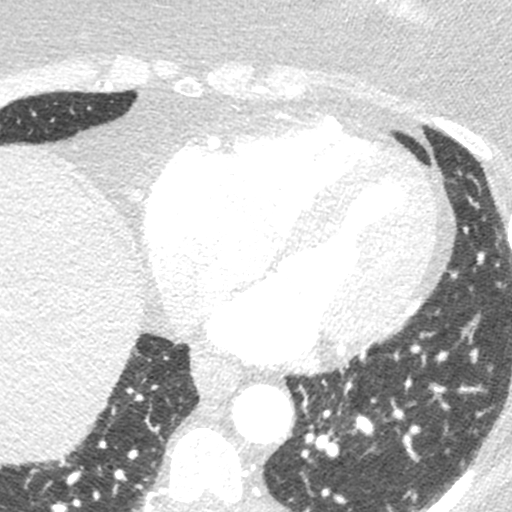
[im 199/298  lung]
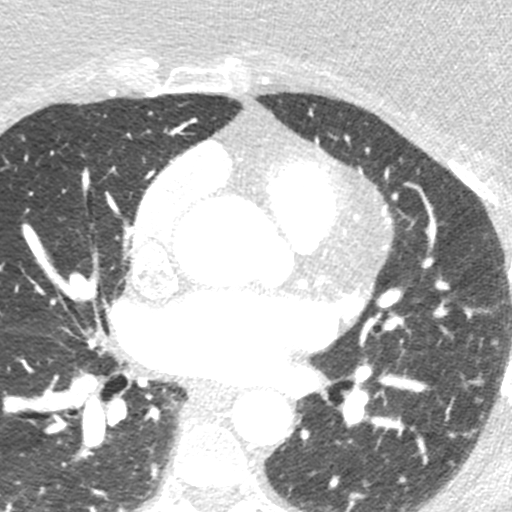

[8 of 20 positions shown; findings below may reference images not displayed]

FINDINGS: Vascular: Heart is normal size.  Aorta normal caliber.

Mediastinum/Nodes: No adenopathy.

Lungs/Pleura: Visualized lungs clear.  No effusions.

Upper Abdomen: Imaging into the upper abdomen demonstrates no acute
findings.

Musculoskeletal: Chest wall soft tissues are unremarkable. No acute
bony abnormality.
IMPRESSION: No acute or significant extracardiac abnormality.

EXAM:
Cardiac/Coronary  CT
FINDINGS: A 120 kV prospective scan was triggered in the descending thoracic
aorta at 111 HU's. Axial non-contrast 3 mm slices were carried out
through the heart. The data set was analyzed on a dedicated work
station and scored using the Agatson method. Gantry rotation speed
was 250 msecs and collimation was .6 mm. No beta blockade and 0.8 mg
of sl NTG was given. The 3D data set was reconstructed in 5%
intervals of the 67-82 % of the R-R cycle. Diastolic phases were
analyzed on a dedicated work station using MPR, MIP and VRT modes.
The patient received 80 cc of contrast.

Aorta:  Normal size.  No calcifications.  No dissection.

Aortic Valve:  Trileaflet.  No calcifications.

Coronary Arteries:  Normal coronary origin.  Right dominance.

RCA is a large dominant artery that gives rise to PDA and PLVB.
There is no plaque.

Left main is a large but short artery that gives rise to LAD and LCX
arteries. There is no plaque.

LAD is a small vessel that gives rise to a moderate sized branching
diagonal. There is no plaque.

LCX is a non-dominant artery that gives rise to one large OM1
branch. There is no plaque.

Other findings:

Normal pulmonary vein drainage into the left atrium.

Normal let atrial appendage without a thrombus.

Normal size of the pulmonary artery.

No evidence of patent foramen ovale.
IMPRESSION: 1. Coronary calcium score of 0. This was 0 percentile for age and
sex matched control.

2. Normal coronary origin with right dominance.

3. No evidence of CAD.  CAD RADS 0.

Hhjgg Seturi

*** End of Addendum ***
EXAM:
OVER-READ INTERPRETATION  CT CHEST

The following report is an over-read performed by radiologist Dr.
Roohalla Muhabat [REDACTED] on 11/19/2019. This over-read
does not include interpretation of cardiac or coronary anatomy or
pathology. The coronary CTA interpretation by the cardiologist is
attached.
FINDINGS: Vascular: Heart is normal size.  Aorta normal caliber.

Mediastinum/Nodes: No adenopathy.

Lungs/Pleura: Visualized lungs clear.  No effusions.

Upper Abdomen: Imaging into the upper abdomen demonstrates no acute
findings.

Musculoskeletal: Chest wall soft tissues are unremarkable. No acute
bony abnormality.
IMPRESSION: No acute or significant extracardiac abnormality.

## 2021-04-16 MED ORDER — CARESTART COVID-19 HOME TEST VI KIT
PACK | 0 refills | Status: DC
  Filled 2021-04-16: qty 4, 4d supply, fill #0
  Filled 2021-09-14: qty 4, 28d supply, fill #0

## 2021-04-28 DIAGNOSIS — G4733 Obstructive sleep apnea (adult) (pediatric): Secondary | ICD-10-CM | POA: Diagnosis not present

## 2021-04-28 DIAGNOSIS — G4730 Sleep apnea, unspecified: Secondary | ICD-10-CM | POA: Diagnosis not present

## 2021-04-30 ENCOUNTER — Ambulatory Visit (INDEPENDENT_AMBULATORY_CARE_PROVIDER_SITE_OTHER): Payer: Medicare HMO | Admitting: Bariatrics

## 2021-05-08 ENCOUNTER — Other Ambulatory Visit (HOSPITAL_COMMUNITY): Payer: Self-pay

## 2021-05-08 DIAGNOSIS — R220 Localized swelling, mass and lump, head: Secondary | ICD-10-CM | POA: Diagnosis not present

## 2021-05-08 DIAGNOSIS — E782 Mixed hyperlipidemia: Secondary | ICD-10-CM | POA: Diagnosis not present

## 2021-05-08 DIAGNOSIS — E1165 Type 2 diabetes mellitus with hyperglycemia: Secondary | ICD-10-CM | POA: Diagnosis not present

## 2021-05-08 DIAGNOSIS — I10 Essential (primary) hypertension: Secondary | ICD-10-CM | POA: Diagnosis not present

## 2021-05-08 MED ORDER — OZEMPIC (0.25 OR 0.5 MG/DOSE) 2 MG/3ML ~~LOC~~ SOPN
PEN_INJECTOR | SUBCUTANEOUS | 0 refills | Status: DC
  Filled 2021-05-08: qty 6, 56d supply, fill #0
  Filled 2021-07-22: qty 3, 28d supply, fill #1
  Filled 2021-08-25: qty 3, 28d supply, fill #2

## 2021-05-21 ENCOUNTER — Encounter (INDEPENDENT_AMBULATORY_CARE_PROVIDER_SITE_OTHER): Payer: Self-pay | Admitting: Bariatrics

## 2021-05-21 ENCOUNTER — Ambulatory Visit (INDEPENDENT_AMBULATORY_CARE_PROVIDER_SITE_OTHER): Payer: Medicare HMO | Admitting: Bariatrics

## 2021-05-21 VITALS — BP 132/84 | HR 79 | Temp 98.0°F | Ht 62.0 in | Wt 232.0 lb

## 2021-05-21 DIAGNOSIS — E785 Hyperlipidemia, unspecified: Secondary | ICD-10-CM | POA: Diagnosis not present

## 2021-05-21 DIAGNOSIS — I10 Essential (primary) hypertension: Secondary | ICD-10-CM

## 2021-05-21 DIAGNOSIS — Z6841 Body Mass Index (BMI) 40.0 and over, adult: Secondary | ICD-10-CM

## 2021-05-21 DIAGNOSIS — E1169 Type 2 diabetes mellitus with other specified complication: Secondary | ICD-10-CM | POA: Diagnosis not present

## 2021-05-21 DIAGNOSIS — E669 Obesity, unspecified: Secondary | ICD-10-CM | POA: Diagnosis not present

## 2021-05-21 DIAGNOSIS — Z7985 Long-term (current) use of injectable non-insulin antidiabetic drugs: Secondary | ICD-10-CM | POA: Diagnosis not present

## 2021-05-22 ENCOUNTER — Other Ambulatory Visit (HOSPITAL_COMMUNITY): Payer: Self-pay

## 2021-05-23 ENCOUNTER — Other Ambulatory Visit (HOSPITAL_COMMUNITY): Payer: Self-pay

## 2021-05-25 ENCOUNTER — Encounter (INDEPENDENT_AMBULATORY_CARE_PROVIDER_SITE_OTHER): Payer: Self-pay | Admitting: Bariatrics

## 2021-05-25 ENCOUNTER — Other Ambulatory Visit (HOSPITAL_COMMUNITY): Payer: Self-pay

## 2021-05-25 MED ORDER — CLOBETASOL PROPIONATE 0.05 % EX OINT
TOPICAL_OINTMENT | CUTANEOUS | 0 refills | Status: DC
  Filled 2021-05-25: qty 15, 7d supply, fill #0

## 2021-05-25 MED ORDER — VERAPAMIL HCL ER 300 MG PO CP24
ORAL_CAPSULE | ORAL | 3 refills | Status: DC
  Filled 2021-05-25: qty 90, 90d supply, fill #0
  Filled 2021-08-25 – 2021-08-26 (×2): qty 90, 90d supply, fill #1

## 2021-05-25 NOTE — Progress Notes (Signed)
? ? ? ?Chief Complaint:  ? ?OBESITY ?Katja is here to discuss her progress with her obesity treatment plan along with follow-up of her obesity related diagnoses. Lucianna is on the Category 2 Plan and states she is following her eating plan approximately 40% of the time. Miki states she is doing 0 minutes 0 times per week. ? ?Today's visit was #: 57 ?Starting weight: 231 lbs ?Starting date: 12/08/2017 ?Today's weight: 232 lbs ?Today's date: 05/21/2021 ?Total lbs lost to date: 0 ?Total lbs lost since last in-office visit: 0 ? ?Interim History: Aiko is up 2 lbs since her last visit and doing well overall.  ? ?Subjective:  ? ?1. Type 2 diabetes mellitus with hyperlipidemia ([place]) ?Willow is currently taking Ozempic. She stopped Jardiance due to side effects. She states Ozempic is helping with appetite.  ? ?2. Essential hypertension ?Oluwanifemi's blood pressure is controlled. Her last blood pressure was 123/84. ? ?Assessment/Plan:  ? ?1. Type 2 diabetes mellitus with hyperlipidemia ([place]) ?Reginna will continue taking Ozempic. She will decrease sugar and increase her water intake. Good blood sugar control is important to decrease the likelihood of diabetic complications such as nephropathy, neuropathy, limb loss, blindness, coronary artery disease, and death. Intensive lifestyle modification including diet, exercise and weight loss are the first line of treatment for diabetes.  ? ?2. Essential hypertension ?Rhylan will continue her medications. She will have no salt added. She is working on healthy weight loss and exercise to improve blood pressure control. We will watch for signs of hypotension as she continues her lifestyle modifications. ? ?3. Obesity, current BMI 42.5 ?Amiliana is currently in the action stage of change. As such, her goal is to continue with weight loss efforts. She has agreed to the Category 2 Plan.  ? ?Timya will continue meal planning and she will continue intentional eating.  ? ?Exercise goals:  [name] will do  some walking.  ? ?Behavioral modification strategies: increasing lean protein intake, decreasing simple carbohydrates, increasing vegetables, increasing water intake, decreasing eating out, no skipping meals, meal planning and cooking strategies, keeping healthy foods in the home, and planning for success. ? ?Kamdyn has agreed to follow-up with our clinic in 4 weeks. She was informed of the importance of frequent follow-up visits to maximize her success with intensive lifestyle modifications for her multiple health conditions.  ? ?Objective:  ? ?Blood pressure 132/84, pulse 79, temperature 98 ?F (36.7 ?C), height '5\' 2"'$  (1.575 m), weight 232 lb (105.2 kg), SpO2 95 %. ?Body mass index is 42.43 kg/m?. ? ?General: Cooperative, alert, well developed, in no acute distress. ?HEENT: Conjunctivae and lids unremarkable. ?Cardiovascular: Regular rhythm.  ?Lungs: Normal work of breathing. ?Neurologic: No focal deficits.  ? ?Lab Results  ?Component Value Date  ? CREATININE 0.81 02/09/2021  ? BUN 21 02/09/2021  ? NA 140 02/09/2021  ? K 4.1 02/09/2021  ? CL 103 02/09/2021  ? CO2 22 02/09/2021  ? ?Lab Results  ?Component Value Date  ? ALT 47 (H) 02/09/2021  ? AST 37 02/09/2021  ? ALKPHOS 81 02/09/2021  ? BILITOT 0.4 02/09/2021  ? ?Lab Results  ?Component Value Date  ? HGBA1C 6.7 (H) 02/09/2021  ? HGBA1C 6.2 (H) 04/08/2020  ? HGBA1C 7.0 (H) 08/22/2019  ? HGBA1C 6.7 (H) 04/19/2019  ? ?Lab Results  ?Component Value Date  ? INSULIN 29.1 (H) 02/09/2021  ? INSULIN 43.2 (H) 04/08/2020  ? INSULIN 33.6 (H) 08/22/2019  ? INSULIN 40.0 (H) 04/19/2019  ? INSULIN 40.9 (H) 04/12/2018  ? ?  Lab Results  ?Component Value Date  ? TSH 2.550 12/08/2017  ? ?Lab Results  ?Component Value Date  ? CHOL 177 02/09/2021  ? HDL 49 02/09/2021  ? [place] 99 02/09/2021  ? TRIG 168 (H) 02/09/2021  ? CHOLHDL 3.6 02/09/2021  ? ?Lab Results  ?Component Value Date  ? VD25OH 42.3 02/09/2021  ? VD25OH 46.6 04/08/2020  ? VD25OH 32.1 08/22/2019  ? ?Lab Results  ?Component  Value Date  ? WBC 7.6 09/05/2019  ? HGB 15.0 09/05/2019  ? HCT 47.0 (H) 09/05/2019  ? MCV 85.0 09/05/2019  ? PLT 228 09/05/2019  ? ?No results found for: IRON, TIBC, FERRITIN ? ?Attestation Statements:  ? ?Reviewed by clinician on day of visit: allergies, medications, problem list, medical history, surgical history, family history, social history, and previous encounter notes. ? ?I, Lizbeth Bark, RMA, am acting as transcriptionist for [employer], DO. ? ?I have reviewed the above documentation for accuracy and completeness, and I agree with the above. Jearld Lesch, DO ? ?

## 2021-05-26 ENCOUNTER — Other Ambulatory Visit (HOSPITAL_COMMUNITY): Payer: Self-pay

## 2021-05-28 ENCOUNTER — Other Ambulatory Visit (HOSPITAL_COMMUNITY): Payer: Self-pay

## 2021-05-29 DIAGNOSIS — G4730 Sleep apnea, unspecified: Secondary | ICD-10-CM | POA: Diagnosis not present

## 2021-05-29 DIAGNOSIS — G4733 Obstructive sleep apnea (adult) (pediatric): Secondary | ICD-10-CM | POA: Diagnosis not present

## 2021-06-01 ENCOUNTER — Other Ambulatory Visit (HOSPITAL_COMMUNITY): Payer: Self-pay

## 2021-06-01 DIAGNOSIS — H8309 Labyrinthitis, unspecified ear: Secondary | ICD-10-CM | POA: Diagnosis not present

## 2021-06-01 MED ORDER — MECLIZINE HCL 25 MG PO TABS
ORAL_TABLET | ORAL | 0 refills | Status: DC
  Filled 2021-06-01: qty 21, 7d supply, fill #0

## 2021-06-12 ENCOUNTER — Other Ambulatory Visit (HOSPITAL_COMMUNITY): Payer: Self-pay

## 2021-06-17 ENCOUNTER — Other Ambulatory Visit (HOSPITAL_COMMUNITY): Payer: Self-pay

## 2021-06-22 ENCOUNTER — Ambulatory Visit (INDEPENDENT_AMBULATORY_CARE_PROVIDER_SITE_OTHER): Payer: Medicare HMO | Admitting: Bariatrics

## 2021-06-22 ENCOUNTER — Other Ambulatory Visit (HOSPITAL_COMMUNITY): Payer: Self-pay

## 2021-06-22 ENCOUNTER — Encounter (INDEPENDENT_AMBULATORY_CARE_PROVIDER_SITE_OTHER): Payer: Self-pay | Admitting: Bariatrics

## 2021-06-22 VITALS — BP 134/85 | HR 77 | Temp 97.9°F | Ht 62.0 in | Wt 236.0 lb

## 2021-06-22 DIAGNOSIS — E669 Obesity, unspecified: Secondary | ICD-10-CM | POA: Diagnosis not present

## 2021-06-22 DIAGNOSIS — E559 Vitamin D deficiency, unspecified: Secondary | ICD-10-CM

## 2021-06-22 DIAGNOSIS — Z7985 Long-term (current) use of injectable non-insulin antidiabetic drugs: Secondary | ICD-10-CM | POA: Diagnosis not present

## 2021-06-22 DIAGNOSIS — E785 Hyperlipidemia, unspecified: Secondary | ICD-10-CM | POA: Diagnosis not present

## 2021-06-22 DIAGNOSIS — Z6841 Body Mass Index (BMI) 40.0 and over, adult: Secondary | ICD-10-CM

## 2021-06-22 DIAGNOSIS — I10 Essential (primary) hypertension: Secondary | ICD-10-CM | POA: Diagnosis not present

## 2021-06-22 DIAGNOSIS — E1169 Type 2 diabetes mellitus with other specified complication: Secondary | ICD-10-CM | POA: Diagnosis not present

## 2021-06-22 MED ORDER — VITAMIN D (ERGOCALCIFEROL) 1.25 MG (50000 UNIT) PO CAPS
ORAL_CAPSULE | ORAL | 0 refills | Status: DC
  Filled 2021-06-22: qty 12, 84d supply, fill #0

## 2021-06-23 ENCOUNTER — Other Ambulatory Visit (HOSPITAL_COMMUNITY): Payer: Self-pay

## 2021-06-25 ENCOUNTER — Other Ambulatory Visit (HOSPITAL_COMMUNITY): Payer: Self-pay

## 2021-06-26 ENCOUNTER — Other Ambulatory Visit (HOSPITAL_COMMUNITY): Payer: Self-pay

## 2021-06-29 ENCOUNTER — Encounter (INDEPENDENT_AMBULATORY_CARE_PROVIDER_SITE_OTHER): Payer: Self-pay | Admitting: Bariatrics

## 2021-06-29 NOTE — Progress Notes (Signed)
? ? ? ?Chief Complaint:  ? ?OBESITY ?Rhonda Aguilar is here to discuss her progress with her obesity treatment plan along with follow-up of her obesity related diagnoses. Rhonda Aguilar is on the Category 2 Plan and states she is following her eating plan approximately 30-40% of the time. Rhonda Aguilar states she is walking for 30 minutes 1 times per week and yoga for 60 minutes 2 times per week. ? ?Today's visit was #: 67 ?Starting weight: 231 lbs ?Starting date: 12/08/2017 ?Today's weight: 236 lbs ?Today's date: 06/22/2021 ?Total lbs lost to date: 0 ?Total lbs lost since last in-office visit: 0 ? ?Interim History: Rhonda Aguilar is up 4 lbs since her last visit. She has had a stressful time.  ? ?Subjective:  ? ?1. Vitamin D deficiency ?Rhonda Aguilar notes minimize sun exposure.  ? ?2. Essential hypertension ?Rhonda Aguilar's blood pressure is reasonably well controlled. Her blood pressure today was 134/85. ? ?3. Hyperlipidemia associated with type 2 diabetes mellitus (Rhonda Aguilar) ?Rhonda Aguilar states that she is back on Ozempic at 0.5 mg.  ? ?Assessment/Plan:  ? ?1. Vitamin D deficiency ?Low Vitamin D level contributes to fatigue and are associated with obesity, breast, and colon cancer. We will refill prescription Vitamin D 50,000 IU every week for 1 month with no refills and Rhonda Aguilar will follow-up for routine testing of Vitamin D, at least 2-3 times per year to avoid over-replacement. ? ?- Vitamin D, Ergocalciferol, (DRISDOL) 1.25 MG (50000 UNIT) CAPS capsule; TAKE 1 CAPSULE BY MOUTH ONCE A WEEK.  Dispense: 12 capsule; Refill: 0 ? ?2. Essential hypertension ?Rhonda Aguilar will continue her medications. She is working on healthy weight loss and exercise to improve blood pressure control. We will watch for signs of hypotension as she continues her lifestyle modifications. ? ?3. Hyperlipidemia associated with type 2 diabetes mellitus (Rhonda Aguilar) ?Cardiovascular risk and specific lipid/LDL goals reviewed.  Briseidy will continue Ozempic and she will increase over time. She will get information for  [facility] surgery to check on possible bariatric surgery. We discussed several lifestyle modifications today and Rhonda Aguilar will continue to work on diet, exercise and weight loss efforts. Orders and follow up as documented in patient record.  ? ?Counseling ?Intensive lifestyle modifications are the first line treatment for this issue. ?Dietary changes: Increase soluble fiber. Decrease simple carbohydrates. ?Exercise changes: Moderate to vigorous-intensity aerobic activity 150 minutes per week if tolerated. ?Lipid-lowering medications: see documented in medical record. ? ?4. Obesity, current BMI 43.3 ?Rhonda Aguilar is currently in the action stage of change. As such, her goal is to continue with weight loss efforts. She has agreed to the Category 2 Plan.  ? ?Rhonda Aguilar will continue meal planning and she will continue intentional eating. She will be more consistent with her routines and meal preparation.  ? ?Exercise goals:  As is.  ? ?Behavioral modification strategies: increasing lean protein intake, decreasing simple carbohydrates, increasing vegetables, increasing water intake, decreasing eating out, no skipping meals, meal planning and cooking strategies, keeping healthy foods in the home, and planning for success. ? ?Rhonda Aguilar has agreed to follow-up with our clinic in 5 weeks. She was informed of the importance of frequent follow-up visits to maximize her success with intensive lifestyle modifications for her multiple health conditions.  ? ?Objective:  ? ?Blood pressure 134/85, pulse 77, temperature 97.9 ?F (36.6 ?C), height '5\' 2"'$  (1.575 m), weight 236 lb (107 kg), SpO2 91 %. ?Body mass index is 43.16 kg/m?. ? ?General: Cooperative, alert, well developed, in no acute distress. ?HEENT: Conjunctivae and lids unremarkable. ?Cardiovascular: Regular rhythm.  ?  Lungs: Normal work of breathing. ?Neurologic: No focal deficits.  ? ?Lab Results  ?Component Value Date  ? CREATININE 0.81 02/09/2021  ? BUN 21 02/09/2021  ? NA 140  02/09/2021  ? K 4.1 02/09/2021  ? CL 103 02/09/2021  ? CO2 22 02/09/2021  ? ?Lab Results  ?Component Value Date  ? ALT 47 (H) 02/09/2021  ? AST 37 02/09/2021  ? ALKPHOS 81 02/09/2021  ? BILITOT 0.4 02/09/2021  ? ?Lab Results  ?Component Value Date  ? HGBA1C 6.7 (H) 02/09/2021  ? HGBA1C 6.2 (H) 04/08/2020  ? HGBA1C 7.0 (H) 08/22/2019  ? HGBA1C 6.7 (H) 04/19/2019  ? ?Lab Results  ?Component Value Date  ? INSULIN 29.1 (H) 02/09/2021  ? INSULIN 43.2 (H) 04/08/2020  ? INSULIN 33.6 (H) 08/22/2019  ? INSULIN 40.0 (H) 04/19/2019  ? INSULIN 40.9 (H) 04/12/2018  ? ?Lab Results  ?Component Value Date  ? TSH 2.550 12/08/2017  ? ?Lab Results  ?Component Value Date  ? CHOL 177 02/09/2021  ? HDL 49 02/09/2021  ? [place] 99 02/09/2021  ? TRIG 168 (H) 02/09/2021  ? CHOLHDL 3.6 02/09/2021  ? ?Lab Results  ?Component Value Date  ? VD25OH 42.3 02/09/2021  ? VD25OH 46.6 04/08/2020  ? VD25OH 32.1 08/22/2019  ? ?Lab Results  ?Component Value Date  ? WBC 7.6 09/05/2019  ? HGB 15.0 09/05/2019  ? HCT 47.0 (H) 09/05/2019  ? MCV 85.0 09/05/2019  ? PLT 228 09/05/2019  ? ?No results found for: IRON, TIBC, FERRITIN ? ?Attestation Statements:  ? ?Reviewed by clinician on day of visit: allergies, medications, problem list, medical history, surgical history, family history, social history, and previous encounter notes. ? ?I, Lizbeth Bark, RMA, am acting as transcriptionist for [employer], DO. ? ?I have reviewed the above documentation for accuracy and completeness, and I agree with the above. Jearld Lesch, DO ? ?

## 2021-07-04 IMAGING — US US BREAST*R* LIMITED INC AXILLA
1 series · 11 of 11 positions shown · non-contrast
Comparison: Previous exam(s).

CLINICAL DATA: Patient recalled from screening for right breast
mass.

EXAM:
ULTRASOUND OF THE RIGHT BREAST

[Series 1: us breast*right* limited inc axilla · 0.08mm/px · 11 of 11 slices shown]
[im 1/11]
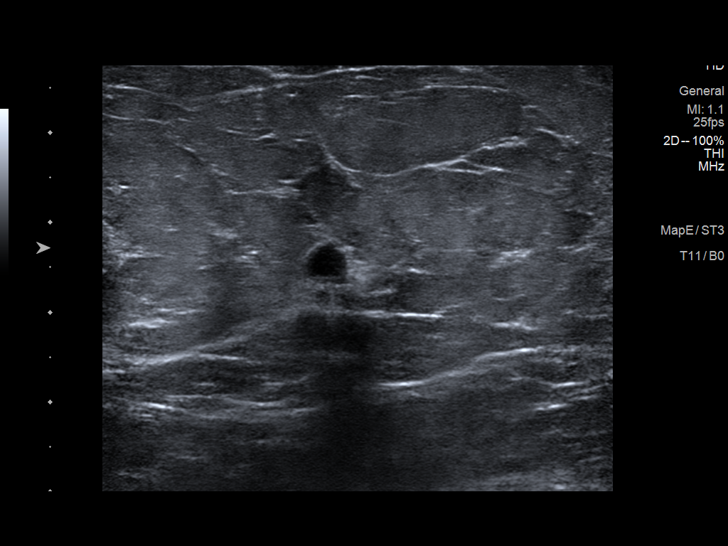
[im 2/11]
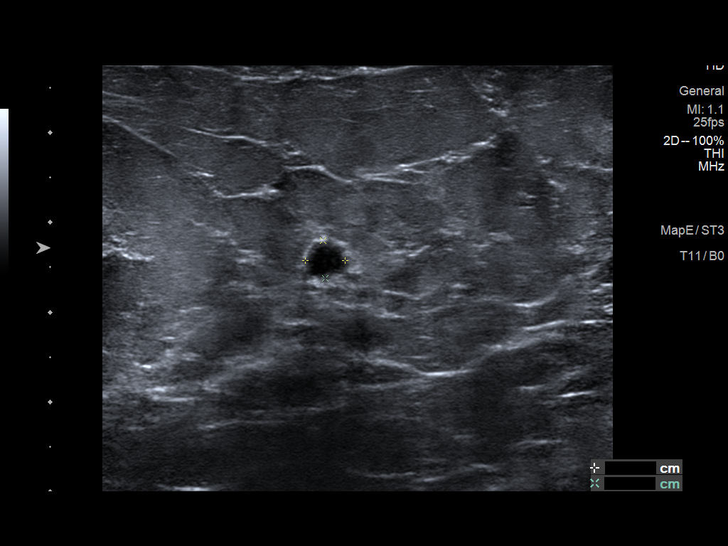
[im 3/11]
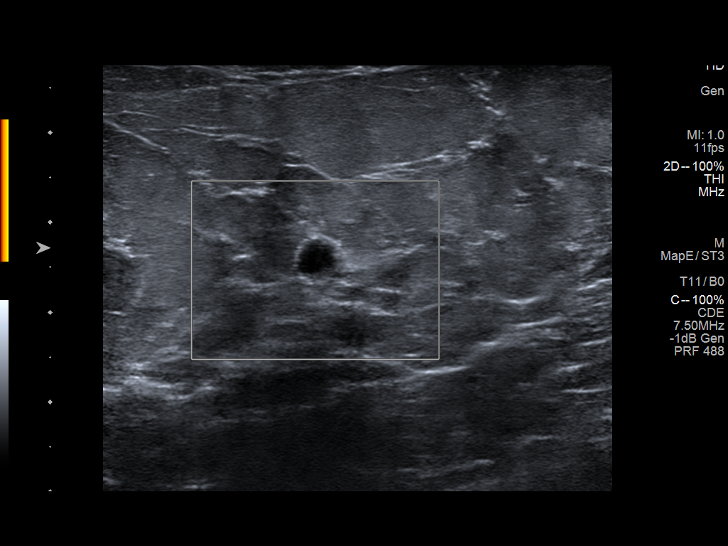
[im 4/11]
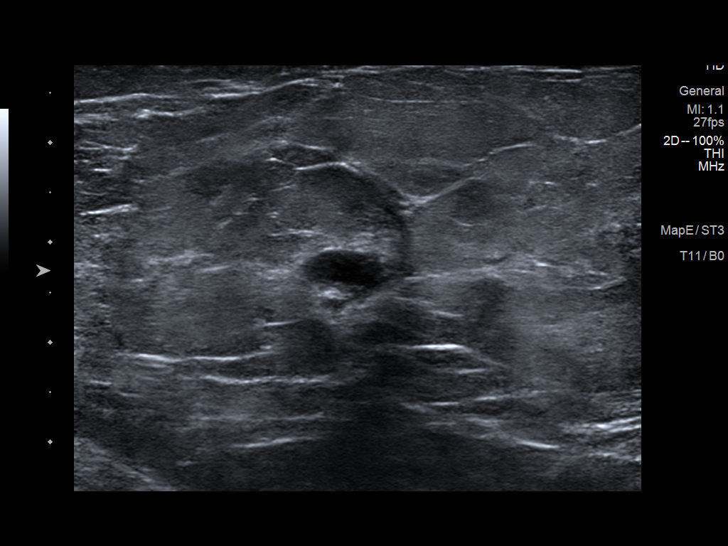
[im 5/11]
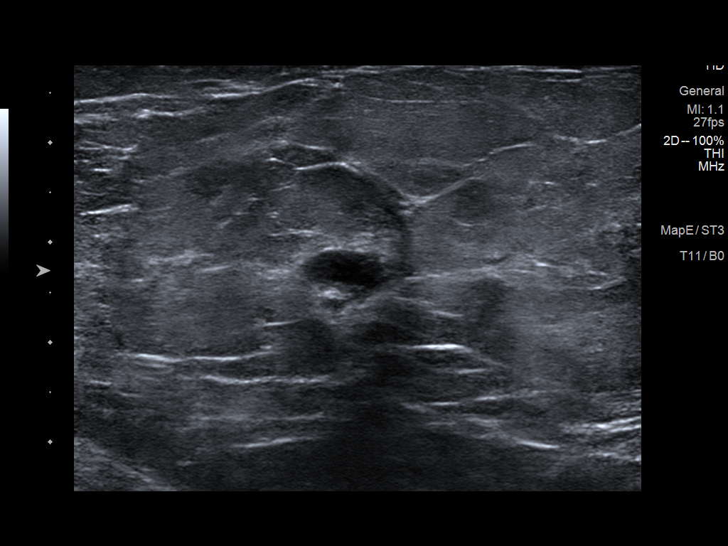
[im 6/11]
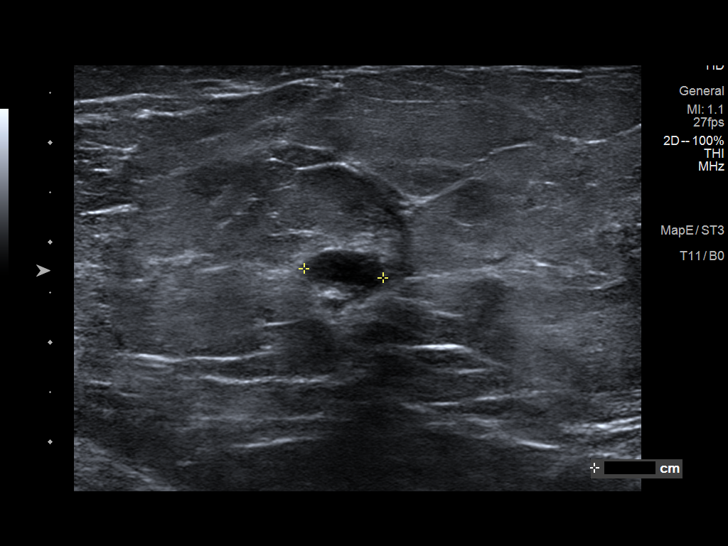
[im 7/11]
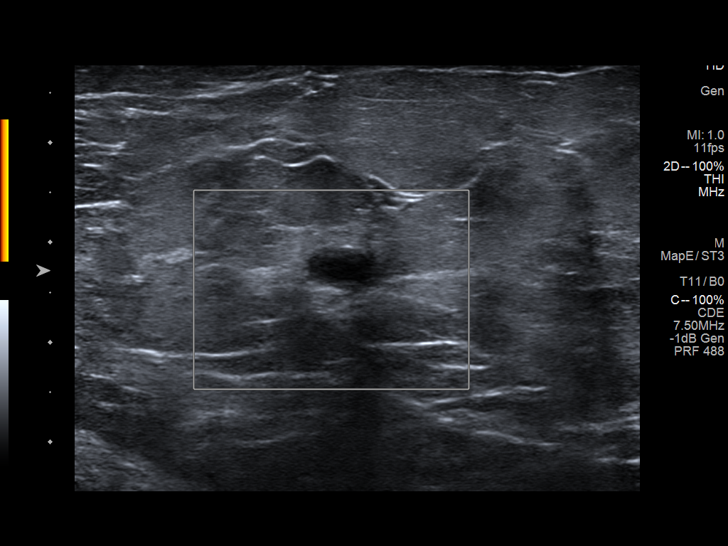
[im 8/11]
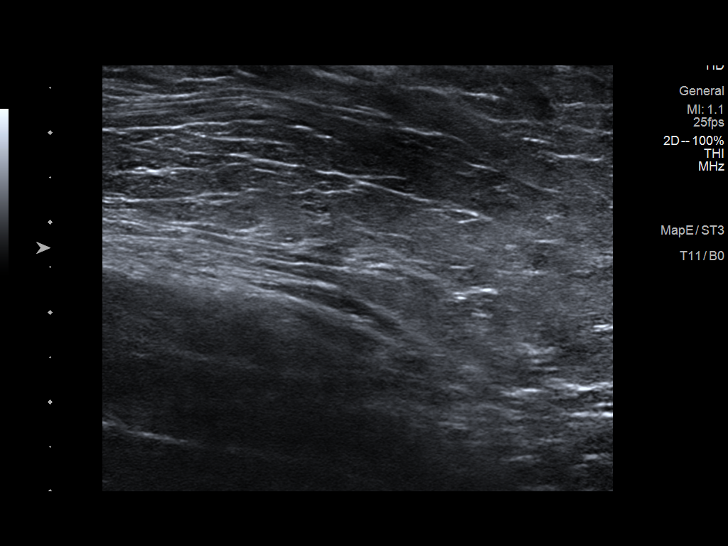
[im 9/11]
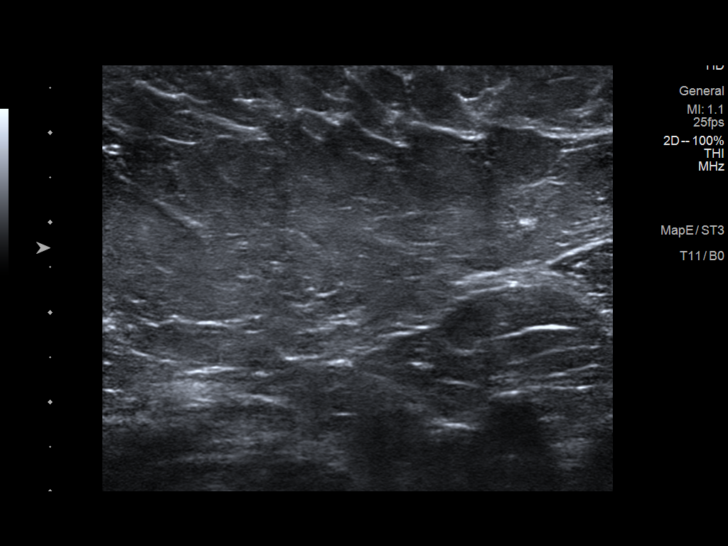
[im 10/11]
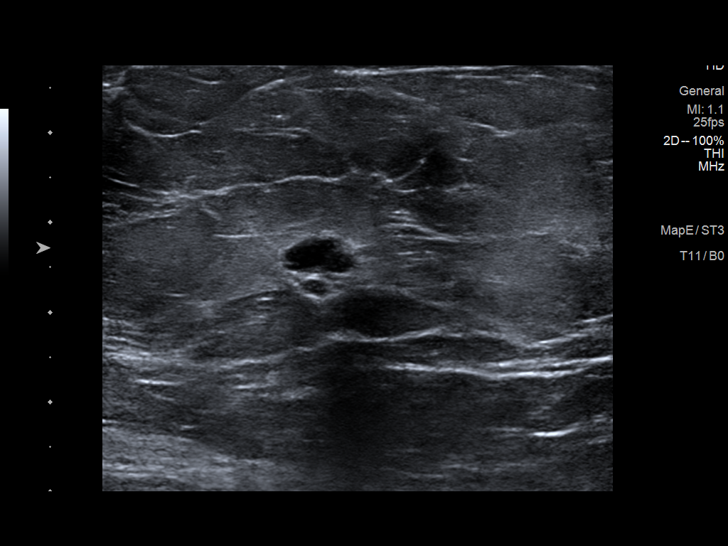
[im 11/11]
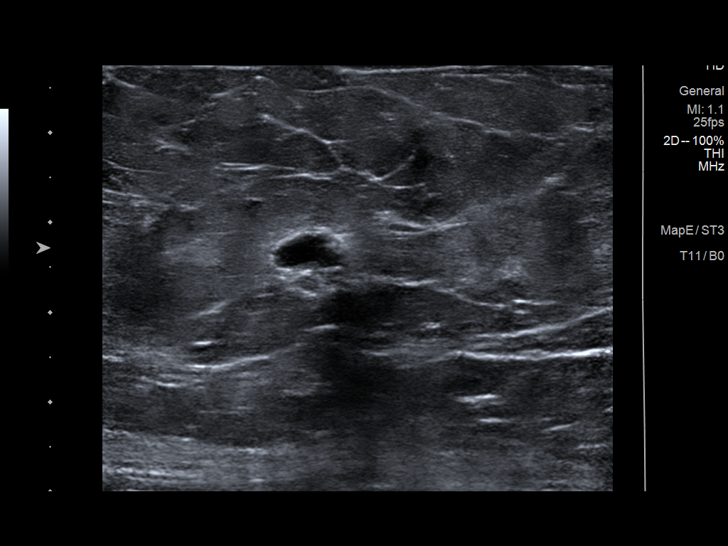

[11 of 11 positions shown; findings below may reference images not displayed]

FINDINGS: Targeted ultrasound is performed, showing a 4 x 4 x 8 mm mildly
complicated cyst right breast 9 o'clock position 7 cm from nipple.
IMPRESSION: Probably benign right breast mass favored to represent a complicated
cyst.

RECOMMENDATION:
Right breast ultrasound in 6 months to reassess probably benign
right breast mass.

I have discussed the findings and recommendations with the patient.
If applicable, a reminder letter will be sent to the patient
regarding the next appointment.

BI-RADS CATEGORY  3: Probably benign.

## 2021-07-13 ENCOUNTER — Other Ambulatory Visit (HOSPITAL_COMMUNITY): Payer: Self-pay

## 2021-07-14 DIAGNOSIS — G4733 Obstructive sleep apnea (adult) (pediatric): Secondary | ICD-10-CM | POA: Diagnosis not present

## 2021-07-14 DIAGNOSIS — G4730 Sleep apnea, unspecified: Secondary | ICD-10-CM | POA: Diagnosis not present

## 2021-07-16 ENCOUNTER — Other Ambulatory Visit (HOSPITAL_COMMUNITY): Payer: Self-pay

## 2021-07-22 ENCOUNTER — Other Ambulatory Visit (HOSPITAL_COMMUNITY): Payer: Self-pay

## 2021-07-28 ENCOUNTER — Encounter (INDEPENDENT_AMBULATORY_CARE_PROVIDER_SITE_OTHER): Payer: Self-pay | Admitting: Bariatrics

## 2021-07-28 ENCOUNTER — Ambulatory Visit (INDEPENDENT_AMBULATORY_CARE_PROVIDER_SITE_OTHER): Payer: Medicare HMO | Admitting: Bariatrics

## 2021-07-28 VITALS — BP 135/85 | HR 77 | Temp 97.8°F | Ht 62.0 in | Wt 235.0 lb

## 2021-07-28 DIAGNOSIS — Z6841 Body Mass Index (BMI) 40.0 and over, adult: Secondary | ICD-10-CM | POA: Diagnosis not present

## 2021-07-28 DIAGNOSIS — E785 Hyperlipidemia, unspecified: Secondary | ICD-10-CM | POA: Diagnosis not present

## 2021-07-28 DIAGNOSIS — K760 Fatty (change of) liver, not elsewhere classified: Secondary | ICD-10-CM

## 2021-07-28 DIAGNOSIS — E669 Obesity, unspecified: Secondary | ICD-10-CM

## 2021-07-28 DIAGNOSIS — E1169 Type 2 diabetes mellitus with other specified complication: Secondary | ICD-10-CM | POA: Diagnosis not present

## 2021-07-29 ENCOUNTER — Telehealth: Payer: Self-pay | Admitting: Adult Health

## 2021-07-29 NOTE — Progress Notes (Signed)
Chief Complaint:   OBESITY Rhonda Aguilar is here to discuss her progress with her obesity treatment plan along with follow-up of her obesity related diagnoses. Rhonda Aguilar is on the Category 2 Plan and states she is following her eating plan approximately 50% of the time. Rhonda Aguilar states she is walking for 20 minutes 2 times per week.  Today's visit was #: 55 Starting weight: 231 lbs Starting date: 12/08/2017 Today's weight: 235 lbs Today's date: 07/28/2021 Total lbs lost to date: 0 Total lbs lost since last in-office visit: 1 lb  Interim History: Rhonda Aguilar is down 1 lb since her last visit. She has been using more frozen foods.   Subjective:   1. Type 2 diabetes mellitus with hyperlipidemia ([name]) Rhonda Aguilar is currently taking Ozempic. Her fasting blood sugar was in the ranges of 120's-130's.  2. Fatty liver Rhonda Aguilar is not on medications currently.   Assessment/Plan:   1. Type 2 diabetes mellitus with hyperlipidemia (Rhonda Aguilar) Rhonda Aguilar will continue Ozempic. Good blood sugar control is important to decrease the likelihood of diabetic complications such as nephropathy, neuropathy, limb loss, blindness, coronary artery disease, and death. Intensive lifestyle modification including diet, exercise and weight loss are the first line of treatment for diabetes.   2. Fatty liver Rhonda Aguilar will continue to work on the plan and exercise. We discussed the likely diagnosis of non-alcoholic fatty liver disease today and how this condition is obesity related. Rhonda Aguilar was educated the importance of weight loss. Rhonda Aguilar agreed to continue with her weight loss efforts with healthier diet and exercise as an essential part of her treatment plan.   3. Obesity, current BMI 43.0 Rhonda Aguilar is currently in the action stage of change. As such, her goal is to continue with weight loss efforts. She has agreed to the Category 2 Plan.   Rhonda Aguilar will continue meal planning. She will adhere to the plan more closely 80-90%. Eating Out Sheet was provided  today.   Exercise goals:  As is. Rhonda Aguilar will walk more, up to 5,000 steps daily.   Behavioral modification strategies: increasing lean protein intake, decreasing simple carbohydrates, increasing vegetables, increasing water intake, decreasing eating out, no skipping meals, meal planning and cooking strategies, keeping healthy foods in the home, and planning for success.  Rhonda Aguilar has agreed to follow-up with our clinic in 4 weeks. She was informed of the importance of frequent follow-up visits to maximize her success with intensive lifestyle modifications for her multiple health conditions.   Objective:   Blood pressure 135/85, pulse 77, temperature 97.8 F (36.6 C), height '5\' 2"'$  (1.575 m), weight 235 lb (106.6 kg), SpO2 95 %. Body mass index is 42.98 kg/m.  General: Cooperative, alert, well developed, in no acute distress. HEENT: Conjunctivae and lids unremarkable. Cardiovascular: Regular rhythm.  Lungs: Normal work of breathing. Neurologic: No focal deficits.   Lab Results  Component Value Date   CREATININE 0.81 02/09/2021   BUN 21 02/09/2021   NA 140 02/09/2021   K 4.1 02/09/2021   CL 103 02/09/2021   CO2 22 02/09/2021   Lab Results  Component Value Date   ALT 47 (H) 02/09/2021   AST 37 02/09/2021   ALKPHOS 81 02/09/2021   BILITOT 0.4 02/09/2021   Lab Results  Component Value Date   HGBA1C 6.7 (H) 02/09/2021   HGBA1C 6.2 (H) 04/08/2020   HGBA1C 7.0 (H) 08/22/2019   HGBA1C 6.7 (H) 04/19/2019   Lab Results  Component Value Date   INSULIN 29.1 (H) 02/09/2021   INSULIN 43.2 (  H) 04/08/2020   INSULIN 33.6 (H) 08/22/2019   INSULIN 40.0 (H) 04/19/2019   INSULIN 40.9 (H) 04/12/2018   Lab Results  Component Value Date   TSH 2.550 12/08/2017   Lab Results  Component Value Date   CHOL 177 02/09/2021   HDL 49 02/09/2021   LDLCALC 99 02/09/2021   TRIG 168 (H) 02/09/2021   CHOLHDL 3.6 02/09/2021   Lab Results  Component Value Date   VD25OH 42.3 02/09/2021   VD25OH  46.6 04/08/2020   VD25OH 32.1 08/22/2019   Lab Results  Component Value Date   WBC 7.6 09/05/2019   HGB 15.0 09/05/2019   HCT 47.0 (H) 09/05/2019   MCV 85.0 09/05/2019   PLT 228 09/05/2019   No results found for: IRON, TIBC, FERRITIN  Attestation Statements:   Reviewed by clinician on day of visit: allergies, medications, problem list, medical history, surgical history, family history, social history, and previous encounter notes.  I, Lizbeth Bark, RMA, am acting as Location manager for [employer], DO.  I have reviewed the above documentation for accuracy and completeness, and I agree with the above. Jearld Lesch, DO

## 2021-07-29 NOTE — Telephone Encounter (Signed)
LVM and sent mychart msg informing pt of r/s needed for 07/27- Megan out.

## 2021-07-31 DIAGNOSIS — E1165 Type 2 diabetes mellitus with hyperglycemia: Secondary | ICD-10-CM | POA: Diagnosis not present

## 2021-08-03 ENCOUNTER — Encounter (INDEPENDENT_AMBULATORY_CARE_PROVIDER_SITE_OTHER): Payer: Self-pay | Admitting: Bariatrics

## 2021-08-14 DIAGNOSIS — G4733 Obstructive sleep apnea (adult) (pediatric): Secondary | ICD-10-CM | POA: Diagnosis not present

## 2021-08-14 DIAGNOSIS — G4730 Sleep apnea, unspecified: Secondary | ICD-10-CM | POA: Diagnosis not present

## 2021-08-18 ENCOUNTER — Other Ambulatory Visit: Payer: Self-pay | Admitting: Family Medicine

## 2021-08-18 DIAGNOSIS — R221 Localized swelling, mass and lump, neck: Secondary | ICD-10-CM

## 2021-08-20 ENCOUNTER — Ambulatory Visit
Admission: RE | Admit: 2021-08-20 | Discharge: 2021-08-20 | Disposition: A | Discharge status: Home / Self Care | Payer: Medicare HMO | Source: Ambulatory Visit | Attending: Family Medicine | Admitting: Family Medicine

## 2021-08-20 DIAGNOSIS — E041 Nontoxic single thyroid nodule: Secondary | ICD-10-CM | POA: Diagnosis not present

## 2021-08-20 DIAGNOSIS — R221 Localized swelling, mass and lump, neck: Secondary | ICD-10-CM

## 2021-08-20 DIAGNOSIS — R590 Localized enlarged lymph nodes: Secondary | ICD-10-CM | POA: Diagnosis not present

## 2021-08-26 ENCOUNTER — Other Ambulatory Visit (HOSPITAL_COMMUNITY): Payer: Self-pay

## 2021-08-27 ENCOUNTER — Other Ambulatory Visit (HOSPITAL_COMMUNITY): Payer: Self-pay

## 2021-08-27 ENCOUNTER — Encounter (INDEPENDENT_AMBULATORY_CARE_PROVIDER_SITE_OTHER): Payer: Self-pay | Admitting: Bariatrics

## 2021-08-27 ENCOUNTER — Ambulatory Visit (INDEPENDENT_AMBULATORY_CARE_PROVIDER_SITE_OTHER): Payer: Medicare HMO | Admitting: Bariatrics

## 2021-08-27 VITALS — BP 139/68 | HR 78 | Temp 98.1°F | Ht 62.0 in | Wt 234.0 lb

## 2021-08-27 DIAGNOSIS — E559 Vitamin D deficiency, unspecified: Secondary | ICD-10-CM | POA: Diagnosis not present

## 2021-08-27 DIAGNOSIS — E669 Obesity, unspecified: Secondary | ICD-10-CM

## 2021-08-27 DIAGNOSIS — E1169 Type 2 diabetes mellitus with other specified complication: Secondary | ICD-10-CM | POA: Diagnosis not present

## 2021-08-27 DIAGNOSIS — Z6841 Body Mass Index (BMI) 40.0 and over, adult: Secondary | ICD-10-CM

## 2021-08-27 DIAGNOSIS — E785 Hyperlipidemia, unspecified: Secondary | ICD-10-CM | POA: Diagnosis not present

## 2021-08-27 DIAGNOSIS — Z7985 Long-term (current) use of injectable non-insulin antidiabetic drugs: Secondary | ICD-10-CM | POA: Diagnosis not present

## 2021-08-30 NOTE — Progress Notes (Unsigned)
Chief Complaint:   OBESITY Rhonda Aguilar is here to discuss her progress with her obesity treatment plan along with follow-up of her obesity related diagnoses. Rhonda Aguilar is on the Category 2 Plan and states she is following her eating plan approximately 50% of the time. Rhonda Aguilar states she is walking 1 mile 1 time per day.    Today's visit was #: 39 Starting weight: 231 lbs Starting date: 12/08/2017 Today's weight: 234 lbs Today's date: 08/27/2021 Total lbs lost to date: 0 Total lbs lost since last in-office visit: 1  Interim History: Rhonda Aguilar is down 1 pound since her last visit.  She thinks that the Ozempic has "kicked in".  Subjective:   1. Vitamin D deficiency Rhonda Aguilar is taking prescription vitamin D as directed.  2. Type 2 diabetes mellitus with hyperlipidemia ([place]) Rhonda Aguilar is taking Ozempic, and she notes some GERD symptoms.  Last A1c was 6.3.  Assessment/Plan:   1. Vitamin D deficiency Rhonda Aguilar will continue prescription Vitamin D 50,000 IU every week and will follow-up for routine testing of Vitamin D, at least 2-3 times per year to avoid over-replacement.  2. Type 2 diabetes mellitus with hyperlipidemia ([place]) Rhonda Aguilar will continue Ozempic, and we will check labs as needed.   3. Obesity, current BMI 42.9 Rhonda Aguilar is currently in the action stage of change. As such, her goal is to continue with weight loss efforts. She has agreed to the Category 2 Plan.   Meal planning was discussed.  Rhonda Aguilar will adhere to the plan 80-90%.  She will keep her water intake high.  Low carbohydrate snack sheet was given.  Exercise goals: As is.   Behavioral modification strategies: increasing lean protein intake, decreasing simple carbohydrates, increasing vegetables, increasing water intake, decreasing eating out, no skipping meals, meal planning and cooking strategies, keeping healthy foods in the home, and planning for success.  Rhonda Aguilar has agreed to follow-up with our clinic in 4 weeks. She was informed of the  importance of frequent follow-up visits to maximize her success with intensive lifestyle modifications for her multiple health conditions.   Objective:   Blood pressure 139/68, pulse 78, temperature 98.1 F (36.7 C), height '5\' 2"'$  (1.575 m), weight 234 lb (106.1 kg), SpO2 95 %. Body mass index is 42.8 kg/m.  General: Cooperative, alert, well developed, in no acute distress. HEENT: Conjunctivae and lids unremarkable. Cardiovascular: Regular rhythm.  Lungs: Normal work of breathing. Neurologic: No focal deficits.   Lab Results  Component Value Date   CREATININE 0.81 02/09/2021   BUN 21 02/09/2021   NA 140 02/09/2021   K 4.1 02/09/2021   CL 103 02/09/2021   CO2 22 02/09/2021   Lab Results  Component Value Date   ALT 47 (H) 02/09/2021   AST 37 02/09/2021   ALKPHOS 81 02/09/2021   BILITOT 0.4 02/09/2021   Lab Results  Component Value Date   HGBA1C 6.7 (H) 02/09/2021   HGBA1C 6.2 (H) 04/08/2020   HGBA1C 7.0 (H) 08/22/2019   HGBA1C 6.7 (H) 04/19/2019   Lab Results  Component Value Date   INSULIN 29.1 (H) 02/09/2021   INSULIN 43.2 (H) 04/08/2020   INSULIN 33.6 (H) 08/22/2019   INSULIN 40.0 (H) 04/19/2019   INSULIN 40.9 (H) 04/12/2018   Lab Results  Component Value Date   TSH 2.550 12/08/2017   Lab Results  Component Value Date   CHOL 177 02/09/2021   HDL 49 02/09/2021   LDLCALC 99 02/09/2021   TRIG 168 (H) 02/09/2021   CHOLHDL 3.6  02/09/2021   Lab Results  Component Value Date   VD25OH 42.3 02/09/2021   VD25OH 46.6 04/08/2020   VD25OH 32.1 08/22/2019   Lab Results  Component Value Date   WBC 7.6 09/05/2019   HGB 15.0 09/05/2019   HCT 47.0 (H) 09/05/2019   MCV 85.0 09/05/2019   PLT 228 09/05/2019   No results found for: "IRON", "TIBC", "FERRITIN"  Attestation Statements:   Reviewed by clinician on day of visit: allergies, medications, problem list, medical history, surgical history, family history, social history, and previous encounter notes.   [name], am acting as Location manager for [employer], DO.  I have reviewed the above documentation for accuracy and completeness, and I agree with the above. Jearld Lesch, DO

## 2021-09-01 ENCOUNTER — Encounter (INDEPENDENT_AMBULATORY_CARE_PROVIDER_SITE_OTHER): Payer: Self-pay | Admitting: Bariatrics

## 2021-09-02 DIAGNOSIS — G4733 Obstructive sleep apnea (adult) (pediatric): Secondary | ICD-10-CM | POA: Diagnosis not present

## 2021-09-02 DIAGNOSIS — G4730 Sleep apnea, unspecified: Secondary | ICD-10-CM | POA: Diagnosis not present

## 2021-09-13 DIAGNOSIS — G4730 Sleep apnea, unspecified: Secondary | ICD-10-CM | POA: Diagnosis not present

## 2021-09-13 DIAGNOSIS — G4733 Obstructive sleep apnea (adult) (pediatric): Secondary | ICD-10-CM | POA: Diagnosis not present

## 2021-09-14 ENCOUNTER — Other Ambulatory Visit (HOSPITAL_COMMUNITY): Payer: Self-pay

## 2021-09-17 ENCOUNTER — Ambulatory Visit: Payer: Medicare HMO | Admitting: Adult Health

## 2021-09-23 ENCOUNTER — Other Ambulatory Visit (HOSPITAL_COMMUNITY): Payer: Self-pay

## 2021-09-24 ENCOUNTER — Other Ambulatory Visit (HOSPITAL_COMMUNITY): Payer: Self-pay

## 2021-09-24 MED ORDER — OZEMPIC (0.25 OR 0.5 MG/DOSE) 2 MG/3ML ~~LOC~~ SOPN
PEN_INJECTOR | SUBCUTANEOUS | 0 refills | Status: DC
  Filled 2021-09-24: qty 3, 28d supply, fill #0

## 2021-09-24 MED ORDER — CLOBETASOL PROPIONATE 0.05 % EX OINT
TOPICAL_OINTMENT | CUTANEOUS | 0 refills | Status: DC
  Filled 2021-09-24: qty 15, 7d supply, fill #0

## 2021-09-29 ENCOUNTER — Ambulatory Visit (INDEPENDENT_AMBULATORY_CARE_PROVIDER_SITE_OTHER): Payer: Medicare HMO | Admitting: Bariatrics

## 2021-09-29 ENCOUNTER — Encounter (INDEPENDENT_AMBULATORY_CARE_PROVIDER_SITE_OTHER): Payer: Self-pay | Admitting: Bariatrics

## 2021-09-29 ENCOUNTER — Other Ambulatory Visit (HOSPITAL_COMMUNITY): Payer: Self-pay

## 2021-09-29 VITALS — BP 119/78 | HR 74 | Temp 98.1°F | Ht 62.0 in | Wt 236.0 lb

## 2021-09-29 DIAGNOSIS — Z7985 Long-term (current) use of injectable non-insulin antidiabetic drugs: Secondary | ICD-10-CM | POA: Diagnosis not present

## 2021-09-29 DIAGNOSIS — E669 Obesity, unspecified: Secondary | ICD-10-CM | POA: Diagnosis not present

## 2021-09-29 DIAGNOSIS — E785 Hyperlipidemia, unspecified: Secondary | ICD-10-CM

## 2021-09-29 DIAGNOSIS — E1169 Type 2 diabetes mellitus with other specified complication: Secondary | ICD-10-CM | POA: Diagnosis not present

## 2021-09-29 DIAGNOSIS — E559 Vitamin D deficiency, unspecified: Secondary | ICD-10-CM

## 2021-09-29 DIAGNOSIS — Z6841 Body Mass Index (BMI) 40.0 and over, adult: Secondary | ICD-10-CM

## 2021-09-29 MED ORDER — VITAMIN D (ERGOCALCIFEROL) 1.25 MG (50000 UNIT) PO CAPS
ORAL_CAPSULE | ORAL | 0 refills | Status: DC
  Filled 2021-09-29: qty 12, 84d supply, fill #0

## 2021-09-30 ENCOUNTER — Encounter (INDEPENDENT_AMBULATORY_CARE_PROVIDER_SITE_OTHER): Payer: Self-pay

## 2021-10-05 ENCOUNTER — Other Ambulatory Visit (HOSPITAL_COMMUNITY): Payer: Self-pay

## 2021-10-06 NOTE — Progress Notes (Unsigned)
Chief Complaint:   OBESITY Rhonda Aguilar is here to discuss her progress with her obesity treatment plan along with follow-up of her obesity related diagnoses. Rhonda Aguilar is on {MWMwtlossportion/plan2:23431} and states she is following her eating plan approximately ***% of the time. Rhonda Aguilar states she is *** *** minutes *** times per week.  Today's visit was #: *** Starting weight: *** Starting date: *** Today's weight: *** Today's date: 09/29/2021 Total lbs lost to date: *** Total lbs lost since last in-office visit: ***  Interim History: ***  Subjective:   1. Vitamin D deficiency ***  2. Type 2 diabetes mellitus with hyperlipidemia ([name]) ***  Assessment/Plan:   1. Vitamin D deficiency *** - Vitamin D, Ergocalciferol, (DRISDOL) 1.25 MG (50000 UNIT) CAPS capsule; TAKE 1 CAPSULE BY MOUTH ONCE A WEEK.  Dispense: 12 capsule; Refill: 0  2. Type 2 diabetes mellitus with hyperlipidemia ([name]) ***  3. Obesity, current BMI 43.3 Rhonda Aguilar {CHL AMB IS/IS NOT:210130109} currently in the action stage of change. As such, her goal is to {MWMwtloss#1:210800005}. She has agreed to {MWMwtlossportion/plan2:23431}.   Exercise goals: {MWM EXERCISE RECS:23473}  Behavioral modification strategies: {MWMwtlossdietstrategies3:23432}.  Rhonda Aguilar has agreed to follow-up with our clinic in {NUMBER 1-10:22536} weeks. She was informed of the importance of frequent follow-up visits to maximize her success with intensive lifestyle modifications for her multiple health conditions.   Objective:   Blood pressure 119/78, pulse 74, temperature 98.1 F (36.7 C), height '5\' 2"'$  (1.575 m), weight 236 lb (107 kg), SpO2 95 %. Body mass index is 43.16 kg/m.  General: Cooperative, alert, well developed, in no acute distress. HEENT: Conjunctivae and lids unremarkable. Cardiovascular: Regular rhythm.  Lungs: Normal work of breathing. Neurologic: No focal deficits.   Lab Results  Component Value Date   CREATININE 0.81  02/09/2021   BUN 21 02/09/2021   NA 140 02/09/2021   K 4.1 02/09/2021   CL 103 02/09/2021   CO2 22 02/09/2021   Lab Results  Component Value Date   ALT 47 (H) 02/09/2021   AST 37 02/09/2021   ALKPHOS 81 02/09/2021   BILITOT 0.4 02/09/2021   Lab Results  Component Value Date   HGBA1C 6.7 (H) 02/09/2021   HGBA1C 6.2 (H) 04/08/2020   HGBA1C 7.0 (H) 08/22/2019   HGBA1C 6.7 (H) 04/19/2019   Lab Results  Component Value Date   INSULIN 29.1 (H) 02/09/2021   INSULIN 43.2 (H) 04/08/2020   INSULIN 33.6 (H) 08/22/2019   INSULIN 40.0 (H) 04/19/2019   INSULIN 40.9 (H) 04/12/2018   Lab Results  Component Value Date   TSH 2.550 12/08/2017   Lab Results  Component Value Date   CHOL 177 02/09/2021   HDL 49 02/09/2021   LDLCALC 99 02/09/2021   TRIG 168 (H) 02/09/2021   CHOLHDL 3.6 02/09/2021   Lab Results  Component Value Date   VD25OH 42.3 02/09/2021   VD25OH 46.6 04/08/2020   VD25OH 32.1 08/22/2019   Lab Results  Component Value Date   WBC 7.6 09/05/2019   HGB 15.0 09/05/2019   HCT 47.0 (H) 09/05/2019   MCV 85.0 09/05/2019   PLT 228 09/05/2019   No results found for: "IRON", "TIBC", "FERRITIN"  Attestation Statements:   Reviewed by clinician on day of visit: allergies, medications, problem list, medical history, surgical history, family history, social history, and previous encounter notes.   [name], am acting as Location manager for [employer], DO.  I have reviewed the above documentation for accuracy and completeness, and I  agree with the above. -  ***

## 2021-10-08 ENCOUNTER — Encounter (INDEPENDENT_AMBULATORY_CARE_PROVIDER_SITE_OTHER): Payer: Self-pay | Admitting: Bariatrics

## 2021-10-08 NOTE — Progress Notes (Signed)
PATIENT: Rhonda Aguilar DOB: [birth year]  REASON FOR VISIT: follow up HISTORY FROM: patient Primary neurologist: Dr. Brett Fairy  Chief Complaint  Patient presents with   Follow-up    Rm 19, alone.  C/o of dry mouth sometimes.  Has used chinstrap but did not really help.  She may try some biotene products/ not usre id meds an issue.      HISTORY OF PRESENT ILLNESS: Today 10/12/21:  Rhonda Aguilar is a 66 year old female with a history if OSA on CPAP.  She returns today for follow-up.  Her CPAP report is below.  She reports that the CPAP works well for her.  She continues to notice the benefit.  Reports that she feels well rested throughout the day.  Does report dry mouth 3-4 times a month.  She has used a chinstrap  but didn't help. Never adjusted humidity.     09/11/20: Rhonda Aguilar is a 66 year old female with a history of obstructive sleep apnea on CPAP.  She reports that the CPAP is working well for her.  She denies any new issues.  She returns today for an evaluation.    06/12/19: Rhonda Aguilar is a 66 year old female with a history of obstructive sleep apnea on CPAP.  Her download indicates that she use her machine nightly for compliance of 100%.  Leak in the 95th percentile is 8.2.  She reports that the CPAP is working well for her.  She returns today for an evaluation.  She is machine greater than 4 hours each night.  On average she uses her machine 7 hours and 51 minutes.  Her residual AHI is 0.3 on 6 to 16 cm of water with EPR of 1.  HISTORY CHAI Rhonda Aguilar is a 66 y.o. female with underlying medical history of HTN, DM, migraines, obesity and OSA who was recently evaluated in this office by Dr. Brett Fairy for OSA evaluation on 01/17/2018.  She had been previously diagnosed with OSA in 2012 by this office and continued to use CPAP for management but was referred back to this office as she was lost to follow-up to ensure settings were still appropriate and OSA adequately managed.  She also had  complaints of ocular aura migraines with hemiparetic component with history of migraines (no prior auras) but previously controlled until recently initiating oral metformin for newly diagnosed diabetes.  It was recommended for her to obtain home sleep study without use of CPAP to ensure adequate settings which was obtained on 02/16/2018 and showed AHI 24.9 and REM AHI 52.3 therefore recommended auto titration device with 6 to 16 cm water pressure with a EPR of 2.  Compliance report obtained from 04/02/2018 -05/01/2018 showing 30 out of 30 usage days and 30 days greater than 4 hours 400% compliance.  Average usage 7 hours and 15 minutes with residual AHI 0.2.  Leaks in the 95th percentile 11.7 L/min and pressure in the 95th percentile 11.3 cm H2O with pressure settings 6 cm H2O to 16 cm H2O with EPR level 1.  She does not have any complaints with current CPAP machine or nasal pillows.  She [facility].  She denies any excessive daytime fatigue or insomnia.  Migraines have resolved since discontinuation of diabetic medication.  She continues to have A1c monitored by PCP with most recent level 6.2 not on any diabetic medication.  No further concerns at this time.  She returns today for reevaluation.  REVIEW OF SYSTEMS: Out of a complete 14 system review of symptoms, the patient complains only of the following symptoms, and all other reviewed systems are negative.  FSS 35 ESS 4   ALLERGIES: Allergies  Allergen Reactions   Glyburide Other (See Comments)    Migraines    Metformin And Related Other (See Comments)    migraines   Saxenda [Liraglutide -Weight Management] Nausea Only   Codeine Itching and Rash   Penicillins Rash   Sulfa Antibiotics Rash    HOME MEDICATIONS: Outpatient Medications Prior to Visit  Medication Sig Dispense Refill   busPIRone (BUSPAR) 5 MG tablet Take 1 tablet (5 mg total) by mouth daily. 90 tablet 3    cetirizine (ZYRTEC) 10 MG tablet Take 10 mg by mouth at bedtime.      clobetasol ointment (TEMOVATE) 0.05 % Apply to affected area twice a day as needed 15 g 0   dexlansoprazole (DEXILANT) 60 MG capsule TAKE 1 CAPSULE BY MOUTH ONCE DAILY  20 MINUTES BEFORE BREAKFAST 90 capsule 4   hydrochlorothiazide (HYDRODIURIL) 25 MG tablet Take 12.5 mg by mouth as needed.      ibuprofen (ADVIL) 200 MG tablet Take 600 mg by mouth every 8 (eight) hours as needed for mild pain or moderate pain.      meclizine (ANTIVERT) 25 MG tablet Take 1 tablet by mouth  as needed three times a day for 7 days. 21 tablet 0   Multiple Vitamin (MULTIVITAMIN) tablet Take 1 tablet by mouth daily. Ultra gastric tabs / supplements     rosuvastatin (CRESTOR) 5 MG tablet Take 1 tablet by mouth once weekly 13 tablet 3   Semaglutide,0.25 or 0.5MG /DOS, (OZEMPIC, 0.25 OR 0.5 MG/DOSE,) 2 MG/3ML SOPN Inject 0.5 mg into the skin once a week 12 mL 0   valsartan (DIOVAN) 320 MG tablet TAKE 1 TABLET BY MOUTH ONCE A DAY 90 tablet 3   Verapamil HCl CR 300 MG CP24 Take 1 capsule (300 mg total) by mouth daily. 90 capsule 2   Vitamin D, Ergocalciferol, (DRISDOL) 1.25 MG (50000 UNIT) CAPS capsule TAKE 1 CAPSULE BY MOUTH ONCE A WEEK. 12 capsule 0   clobetasol ointment (TEMOVATE) 0.05 % Apply Externally Twice a day as needed 15 g 1   COVID-19 At Home Antigen Test (CARESTART COVID-19 HOME TEST) KIT USE AS DIRECTED WITHIN PACKAGE INSTRUCTIONS. 4 each 0   COVID-19 mRNA bivalent vaccine, Pfizer, (PFIZER COVID-19 VAC BIVALENT) injection Inject into the muscle. 0.3 mL 0   ezetimibe (ZETIA) 10 MG tablet Take 1 tablet by mouth once daily 90 tablet 3   Probiotic Product (ALIGN) 4 MG CAPS Take 1 capsule by mouth at bedtime.      Semaglutide,0.25 or 0.5MG /DOS, (OZEMPIC, 0.25 OR 0.5 MG/DOSE,) 2 MG/3ML SOPN Inject 0.25mg  into the skin once weekly for 4 weeks then 0.5mg  weekly once a week 12 mL 0   Verapamil HCl CR 300 MG CP24 TAKE 1 CAPSULE BY MOUTH ONCE A DAY 90  capsule 3   Facility-Administered Medications Prior to Visit  Medication Dose Route Frequency Provider Last Rate Last Admin   sodium chloride flush (NS) 0.9 % injection 10 mL  10 mL Intravenous PRN Sueanne Margarita, MD   20 mL at 11/15/19 6283    PAST MEDICAL HISTORY: Past Medical History:  Diagnosis Date   Anxiety    Arthritis    Benign essential HTN 11/22/2014   Diabetes mellitus, type II ([name])    Dyspnea    On exertion  Fatty liver    GERD (gastroesophageal reflux disease)    Hypertension    IBS (irritable bowel syndrome)    Migraine    Obesity (BMI 30-39.9) 11/22/2014   OSA (obstructive sleep apnea)    Pneumonia     PAST SURGICAL HISTORY: Past Surgical History:  Procedure Laterality Date   BREAST BIOPSY Right 2014   CHOLECYSTECTOMY N/A 09/14/2019   Procedure: LAPAROSCOPIC CHOLECYSTECTOMY;  Surgeon: Berna Bue, MD;  Location: [facility];  Service: General;  Laterality: N/A;   COLONOSCOPY     TONSILLECTOMY      FAMILY HISTORY: Family History  Problem Relation Age of Onset   Diabetes Mother    Heart disease Mother    Hypertension Mother    High Cholesterol Mother    Kidney disease Mother    Depression Mother    Obesity Mother    Coronary artery disease Father    High blood pressure Father    High Cholesterol Father    Depression Father    Hypertension Brother     SOCIAL HISTORY: Social History   Socioeconomic History   Marital status: Divorced    Spouse name: Not on file   Number of children: 0   Years of education: college   Highest education level: Not on file  Occupational History   Occupation: Freight forwarder cardiology  Tobacco Use   Smoking status: Never   Smokeless tobacco: Never  Vaping Use   Vaping Use: Never used  Substance and Sexual Activity   Alcohol use: Yes    Alcohol/week: 0.0 - 1.0 standard drinks of alcohol    Comment: occas.   Drug use: Never   Sexual activity: Not on file  Other Topics Concern   Not on file  Social  History Narrative   Not on file   Social Determinants of Health   Financial Resource Strain: Not on file  Food Insecurity: Not on file  Transportation Needs: Not on file  Physical Activity: Not on file  Stress: Not on file  Social Connections: Not on file  Intimate Partner Violence: Not on file      PHYSICAL EXAM  Vitals:   10/12/21 0956  BP: 120/74  Pulse: 67  Weight: 242 lb 9.6 oz (110 kg)  Height: 5\' 2"  (1.575 m)   Body mass index is 44.37 kg/m.  Generalized: Well developed, in no acute distress  Chest: Lungs clear to auscultation bilaterally  Neurological examination  Mentation: Alert oriented to time, place, history taking. Follows all commands speech and language fluent Cranial nerve II-XII: Extraocular movements were full, visual field were full on confrontational test Head turning and shoulder shrug  were normal and symmetric. Motor: The motor testing reveals 5 over 5 strength of all 4 extremities. Good symmetric motor tone is noted throughout.  Sensory: Sensory testing is intact to soft touch on all 4 extremities. No evidence of extinction is noted.  Gait and station: Gait is normal.    DIAGNOSTIC DATA (LABS, IMAGING, TESTING) - I reviewed patient records, labs, notes, testing and imaging myself where available.  Lab Results  Component Value Date   WBC 7.6 09/05/2019   HGB 15.0 09/05/2019   HCT 47.0 (H) 09/05/2019   MCV 85.0 09/05/2019   PLT 228 09/05/2019      Component Value Date/Time   NA 140 02/09/2021 1417   K 4.1 02/09/2021 1417   CL 103 02/09/2021 1417   CO2 22 02/09/2021 1417   GLUCOSE 123 (H) 02/09/2021 1417  GLUCOSE 192 (H) 09/05/2019 1030   BUN 21 02/09/2021 1417   CREATININE 0.81 02/09/2021 1417   CALCIUM 8.9 02/09/2021 1417   PROT 6.3 02/09/2021 1417   ALBUMIN 4.3 02/09/2021 1417   AST 37 02/09/2021 1417   ALT 47 (H) 02/09/2021 1417   ALKPHOS 81 02/09/2021 1417   BILITOT 0.4 02/09/2021 1417   GFRNONAA 71 04/08/2020 1012    GFRAA 81 04/08/2020 1012   Lab Results  Component Value Date   CHOL 177 02/09/2021   HDL 49 02/09/2021   LDLCALC 99 02/09/2021   TRIG 168 (H) 02/09/2021   CHOLHDL 3.6 02/09/2021   Lab Results  Component Value Date   HGBA1C 6.7 (H) 02/09/2021   No results found for: "VITAMINB12" Lab Results  Component Value Date   TSH 2.550 12/08/2017      ASSESSMENT AND PLAN 66 y.o. year old female  has a past medical history of Anxiety, Arthritis, Benign essential HTN (11/22/2014), Diabetes mellitus, type II ([place]), Dyspnea, Fatty liver, GERD (gastroesophageal reflux disease), Hypertension, IBS (irritable bowel syndrome), Migraine, Obesity (BMI 30-39.9) (11/22/2014), OSA (obstructive sleep apnea), and Pneumonia. here with:  OSA on CPAP  - CPAP compliance excellent - Good treatment of AHI  - Encourage patient to use CPAP nightly and > 4 hours each night -Can try using Biotene mouthwash for dry mouth if not helpful may need to do a mask refitting ( full face) - Adjust humidity  - F/U in 1 year or sooner if needed   Ward Givens, MSN, NP-C 10/12/2021, 10:04 AM [facility] [address], [place], [place] [address] [phone]

## 2021-10-12 ENCOUNTER — Encounter: Payer: Self-pay | Admitting: Adult Health

## 2021-10-12 ENCOUNTER — Ambulatory Visit: Payer: Medicare HMO | Admitting: Adult Health

## 2021-10-12 VITALS — BP 120/74 | HR 67 | Ht 62.0 in | Wt 242.6 lb

## 2021-10-12 DIAGNOSIS — Z9989 Dependence on other enabling machines and devices: Secondary | ICD-10-CM

## 2021-10-12 DIAGNOSIS — G4733 Obstructive sleep apnea (adult) (pediatric): Secondary | ICD-10-CM

## 2021-10-12 NOTE — Patient Instructions (Signed)
-   CPAP compliance excellent - Good treatment of AHI  -Can try using Biotene mouthwash for dry mouth if not helpful may need to do a mask refitting - Adjust humidity

## 2021-10-20 ENCOUNTER — Other Ambulatory Visit (HOSPITAL_COMMUNITY): Payer: Self-pay

## 2021-10-20 MED ORDER — OZEMPIC (1 MG/DOSE) 4 MG/3ML ~~LOC~~ SOPN
PEN_INJECTOR | SUBCUTANEOUS | 0 refills | Status: DC
  Filled 2021-10-20: qty 3, 28d supply, fill #0
  Filled 2021-11-23: qty 3, 28d supply, fill #1
  Filled 2021-12-23: qty 3, 28d supply, fill #2
  Filled 2022-02-16: qty 3, 28d supply, fill #3

## 2021-10-22 ENCOUNTER — Ambulatory Visit: Payer: Medicare HMO | Admitting: Physician Assistant

## 2021-10-27 ENCOUNTER — Ambulatory Visit (INDEPENDENT_AMBULATORY_CARE_PROVIDER_SITE_OTHER): Payer: Medicare HMO | Admitting: Bariatrics

## 2021-10-27 ENCOUNTER — Encounter (INDEPENDENT_AMBULATORY_CARE_PROVIDER_SITE_OTHER): Payer: Self-pay | Admitting: Bariatrics

## 2021-10-27 VITALS — BP 129/83 | HR 81 | Temp 97.9°F | Ht 62.0 in | Wt 235.0 lb

## 2021-10-27 DIAGNOSIS — E1169 Type 2 diabetes mellitus with other specified complication: Secondary | ICD-10-CM | POA: Diagnosis not present

## 2021-10-27 DIAGNOSIS — K760 Fatty (change of) liver, not elsewhere classified: Secondary | ICD-10-CM | POA: Diagnosis not present

## 2021-10-27 DIAGNOSIS — E66813 Obesity, class 3: Secondary | ICD-10-CM

## 2021-10-27 DIAGNOSIS — Z7985 Long-term (current) use of injectable non-insulin antidiabetic drugs: Secondary | ICD-10-CM | POA: Diagnosis not present

## 2021-10-27 DIAGNOSIS — E669 Obesity, unspecified: Secondary | ICD-10-CM | POA: Diagnosis not present

## 2021-10-27 DIAGNOSIS — Z6841 Body Mass Index (BMI) 40.0 and over, adult: Secondary | ICD-10-CM

## 2021-10-27 DIAGNOSIS — E785 Hyperlipidemia, unspecified: Secondary | ICD-10-CM | POA: Diagnosis not present

## 2021-10-28 ENCOUNTER — Ambulatory Visit (INDEPENDENT_AMBULATORY_CARE_PROVIDER_SITE_OTHER): Payer: Medicare HMO | Admitting: Bariatrics

## 2021-11-05 ENCOUNTER — Telehealth: Payer: Self-pay

## 2021-11-05 NOTE — Progress Notes (Unsigned)
Chief Complaint:   OBESITY Rhonda Aguilar is here to discuss her progress with her obesity treatment plan along with follow-up of her obesity related diagnoses. Massiel is on the Category 2 Plan and keeping a food journal and adhering to recommended goals of 1200 calories and 80 grams of protein daily and states she is following her eating plan approximately 50% of the time. Kaylana states she is doing water aerobics for 45 minutes 1-2 times per week.  Today's visit was #: 16 Starting weight: 231 lbs Starting date: 12/08/2017 Today's weight: 235 lbs Today's date: 10/27/2021 Total lbs lost to date: 0 Total lbs lost since last in-office visit: 1  Interim History: Sharry is down 1 additional pound since her last visit.  Subjective:   1. Type 2 diabetes mellitus with hyperlipidemia ([name]) Kamyah is taking Ozempic.  She had been off Ozempic but has now resumed.  2. Fatty liver Kathalene is not on medications currently.  Assessment/Plan:   1. Type 2 diabetes mellitus with hyperlipidemia ([name]) Ezabella will continue Ozempic and glucose tablets.  2. Fatty liver Adabella will continue to work on her meal plan and exercise.  3. Obesity, current BMI 43.0 Flor is currently in the action stage of change. As such, her goal is to continue with weight loss efforts. She has agreed to the Category 2 Plan.   We will adhere to the meal plan 80 to 90%.  Increase protein intake.  Exercise goals: As is.   Behavioral modification strategies: increasing lean protein intake, decreasing simple carbohydrates, increasing vegetables, increasing water intake, decreasing eating out, no skipping meals, meal planning and cooking strategies, keeping healthy foods in the home, and planning for success.  Janey has agreed to follow-up with our clinic in 4 weeks. She was informed of the importance of frequent follow-up visits to maximize her success with intensive lifestyle modifications for her multiple health conditions.    Objective:   Blood pressure 129/83, pulse 81, temperature 97.9 F (36.6 C), height '5\' 2"'$  (1.575 m), weight 235 lb (106.6 kg), SpO2 94 %. Body mass index is 42.98 kg/m.  General: Cooperative, alert, well developed, in no acute distress. HEENT: Conjunctivae and lids unremarkable. Cardiovascular: Regular rhythm.  Lungs: Normal work of breathing. Neurologic: No focal deficits.   Lab Results  Component Value Date   CREATININE 0.81 02/09/2021   BUN 21 02/09/2021   NA 140 02/09/2021   K 4.1 02/09/2021   CL 103 02/09/2021   CO2 22 02/09/2021   Lab Results  Component Value Date   ALT 47 (H) 02/09/2021   AST 37 02/09/2021   ALKPHOS 81 02/09/2021   BILITOT 0.4 02/09/2021   Lab Results  Component Value Date   HGBA1C 6.7 (H) 02/09/2021   HGBA1C 6.2 (H) 04/08/2020   HGBA1C 7.0 (H) 08/22/2019   HGBA1C 6.7 (H) 04/19/2019   Lab Results  Component Value Date   INSULIN 29.1 (H) 02/09/2021   INSULIN 43.2 (H) 04/08/2020   INSULIN 33.6 (H) 08/22/2019   INSULIN 40.0 (H) 04/19/2019   INSULIN 40.9 (H) 04/12/2018   Lab Results  Component Value Date   TSH 2.550 12/08/2017   Lab Results  Component Value Date   CHOL 177 02/09/2021   HDL 49 02/09/2021   LDLCALC 99 02/09/2021   TRIG 168 (H) 02/09/2021   CHOLHDL 3.6 02/09/2021   Lab Results  Component Value Date   VD25OH 42.3 02/09/2021   VD25OH 46.6 04/08/2020   VD25OH 32.1 08/22/2019   Lab Results  Component Value Date   WBC 7.6 09/05/2019   HGB 15.0 09/05/2019   HCT 47.0 (H) 09/05/2019   MCV 85.0 09/05/2019   PLT 228 09/05/2019   No results found for: "IRON", "TIBC", "FERRITIN"  Attestation Statements:   Reviewed by clinician on day of visit: allergies, medications, problem list, medical history, surgical history, family history, social history, and previous encounter notes.   [name], am acting as Location manager for [employer], DO.  I have reviewed the above documentation for accuracy and  completeness, and I agree with the above. Jearld Lesch, DO

## 2021-11-05 NOTE — Patient Outreach (Signed)
  Care Coordination   11/05/2021 Name: Rhonda Aguilar MRN: [id number] DOB: 06/04/1955   Care Coordination Outreach Attempts:  An unsuccessful telephone outreach was attempted today to offer the patient information about available care coordination services as a benefit of their health plan.   Follow Up Plan:  Additional outreach attempts will be made to offer the patient care coordination information and services.   Encounter Outcome:  No Answer  Care Coordination Interventions Activated:  No   Care Coordination Interventions:  No, not indicated      Enzo Montgomery, RN,BSN,CCM [place] [facility] Telephonic Care Management Coordinator Direct Phone: [phone] Toll Free: [phone] Fax: [phone]

## 2021-11-09 ENCOUNTER — Encounter (INDEPENDENT_AMBULATORY_CARE_PROVIDER_SITE_OTHER): Payer: Self-pay | Admitting: Bariatrics

## 2021-11-11 ENCOUNTER — Telehealth: Payer: Self-pay

## 2021-11-11 NOTE — Patient Outreach (Signed)
  Care Coordination   Initial Visit Note   11/11/2021 Name: SOUNDRA LAMPLEY MRN: [id number] DOB: 10-22-1955  ELARIA OSIAS is a 66 y.o. year old female who sees Lindell Noe, Anastasia Pall, MD for primary care. I spoke with  Isaiah Serge by phone today.  What matters to the patients health and wellness today?  Patient denies any acute issues or concerns. She is just returning back from vacation. Patient works-current [employer] employee. She is able to manage her care and denies any RN CM needs or concerns at this time.     Goals Addressed             This Visit's Progress    COMPLETED: Care Coordination-no follow up required       Care Coordination Interventions: Advised patient to schedule Annual Wellness Visit(pt reports appt in Nov), flu vaccine(will get within the next few wks) Provided education to patient re: [facility] services Assessed social determinant of health barriers          SDOH assessments and interventions completed:  Yes  SDOH Interventions Today    Flowsheet Row Most Recent Value  SDOH Interventions   Food Insecurity Interventions Intervention Not Indicated  Transportation Interventions Intervention Not Indicated        Care Coordination Interventions Activated:  Yes  Care Coordination Interventions:  Yes, provided   Follow up plan: No further intervention required.   Encounter Outcome:  Pt. Visit Completed   Enzo Montgomery, RN,BSN,CCM [place] [facility] Telephonic Care Management Coordinator Direct Phone: [phone] Toll Free: [phone] Fax: [phone]

## 2021-11-12 ENCOUNTER — Other Ambulatory Visit (HOSPITAL_COMMUNITY): Payer: Self-pay

## 2021-11-12 ENCOUNTER — Ambulatory Visit: Payer: Medicare HMO | Admitting: Physician Assistant

## 2021-11-12 ENCOUNTER — Encounter: Payer: Self-pay | Admitting: Physician Assistant

## 2021-11-12 DIAGNOSIS — G4733 Obstructive sleep apnea (adult) (pediatric): Secondary | ICD-10-CM

## 2021-11-12 DIAGNOSIS — F411 Generalized anxiety disorder: Secondary | ICD-10-CM | POA: Diagnosis not present

## 2021-11-12 DIAGNOSIS — R69 Illness, unspecified: Secondary | ICD-10-CM | POA: Diagnosis not present

## 2021-11-12 MED ORDER — BUSPIRONE HCL 5 MG PO TABS
5.0000 mg | ORAL_TABLET | Freq: Two times a day (BID) | ORAL | 3 refills | Status: DC
  Filled 2021-11-12 – 2021-11-23 (×2): qty 180, 90d supply, fill #0
  Filled 2022-05-18: qty 180, 90d supply, fill #1
  Filled 2022-10-12: qty 180, 90d supply, fill #2

## 2021-11-12 NOTE — Progress Notes (Signed)
[facility]  Patient ID: Rhonda Aguilar,  MRN: [id number]  PCP: Glenis Smoker, MD  Date of Evaluation: 11/12/2021 Time spent:20 minutes  Chief Complaint:  Chief Complaint   Anxiety; Follow-up    HISTORY/CURRENT STATUS: For f/u of anxiety.  Still working 2 days a week, cardiology NP. Glad to not be working more than that. Still has anxiety, worse at work, scratches her head when gets anxious. But feels like the Buspar helps.   Patient is able to enjoy things.  Energy and motivation are good.  No extreme sadness, tearfulness, or feelings of hopelessness.  Sleeps well most of the time.  Uses CPAP.  ADLs and personal hygiene are normal.   Denies any changes in concentration, making decisions, or remembering things.  Appetite has not changed.  Weight is stable.  Denies suicidal or homicidal thoughts.  Denies dizziness, syncope, seizures, numbness, tingling, tremor, unsteady gait, slurred speech, confusion. Denies muscle or joint pain, stiffness, or dystonia.  Individual Medical History/ Review of Systems: Changes? :No     Past medications for mental health diagnoses include:  Pristiq, Zoloft caused her to be more depressed.  Allergies: Glyburide, Metformin and related, Saxenda [liraglutide -weight management], Codeine, Penicillins, and Sulfa antibiotics  Current Medications:  Current Outpatient Medications:    cetirizine (ZYRTEC) 10 MG tablet, Take 10 mg by mouth at bedtime. , Disp: , Rfl:    clobetasol ointment (TEMOVATE) 0.05 %, Apply to affected area twice a day as needed, Disp: 15 g, Rfl: 0   dexlansoprazole (DEXILANT) 60 MG capsule, TAKE 1 CAPSULE BY MOUTH ONCE DAILY  20 MINUTES BEFORE BREAKFAST, Disp: 90 capsule, Rfl: 4   hydrochlorothiazide (HYDRODIURIL) 25 MG tablet, Take 12.5 mg by mouth as needed. , Disp: , Rfl:    ibuprofen (ADVIL) 200 MG tablet, Take 600 mg by mouth every 8 (eight) hours as needed for mild pain or moderate pain. , Disp: , Rfl:     rosuvastatin (CRESTOR) 5 MG tablet, Take 1 tablet by mouth once weekly, Disp: 13 tablet, Rfl: 3   Semaglutide, 1 MG/DOSE, (OZEMPIC, 1 MG/DOSE,) 4 MG/3ML SOPN, inject '1mg'$  under the skin once weekly, Disp: 12 mL, Rfl: 0   valsartan (DIOVAN) 320 MG tablet, TAKE 1 TABLET BY MOUTH ONCE A DAY, Disp: 90 tablet, Rfl: 3   Verapamil HCl CR 300 MG CP24, Take 1 capsule (300 mg total) by mouth daily., Disp: 90 capsule, Rfl: 2   Vitamin D, Ergocalciferol, (DRISDOL) 1.25 MG (50000 UNIT) CAPS capsule, TAKE 1 CAPSULE BY MOUTH ONCE A WEEK., Disp: 12 capsule, Rfl: 0   busPIRone (BUSPAR) 5 MG tablet, Take 1 tablet (5 mg total) by mouth 2 (two) times daily., Disp: 180 tablet, Rfl: 3   meclizine (ANTIVERT) 25 MG tablet, Take 1 tablet by mouth  as needed three times a day for 7 days. (Patient not taking: Reported on 11/12/2021), Disp: 21 tablet, Rfl: 0   Multiple Vitamin (MULTIVITAMIN) tablet, Take 1 tablet by mouth daily. Ultra gastric tabs / supplements (Patient not taking: Reported on 11/12/2021), Disp: , Rfl:  No current facility-administered medications for this visit.  Facility-Administered Medications Ordered in Other Visits:    sodium chloride flush (NS) 0.9 % injection 10 mL, 10 mL, Intravenous, PRN, Radford Pax, Traci R, MD, 20 mL at 11/15/19 0539 Medication Side Effects: none  Family Medical/ Social History: Changes?  Started water aerobics. Working 2 days.   MENTAL HEALTH EXAM:  There were no vitals taken for this visit.There is no height or  weight on file to calculate BMI.  General Appearance: Casual, Neat, Well Groomed and Obese  Eye Contact:  Good  Speech:  Clear and Coherent, Normal Rate, and Talkative  Volume:  Normal  Mood:  Euthymic  Affect:  Appropriate  Thought Process:  Goal Directed and Descriptions of Associations: Circumstantial  Orientation:  Full (Time, Place, and Person)  Thought Content: Logical   Suicidal Thoughts:  No  Homicidal Thoughts:  No  Memory:  WNL  Judgement:  Good  Insight:   Good  Psychomotor Activity:  Normal  Concentration:  Concentration: Good and Attention Span: Good  Recall:  Good  Fund of Knowledge: Good  Language: Good  Assets:  Desire for Improvement Financial Resources/Insurance Housing Transportation Vocational/Educational  ADL's:  Intact  Cognition: WNL  Prognosis:  Good   Labs 02/09/2021 reviewed.  PCP follows them.  DIAGNOSES:    ICD-10-CM   1. Generalized anxiety disorder  F41.1     2. Obstructive sleep apnea  G47.33      Receiving Psychotherapy: No   RECOMMENDATIONS:  PDMP reviewed.  Last Xanax filled 03/03/2020. I provided 20 minutes of face to face time during this encounter, including time spent before and after the visit in records review, medical decision making, counseling pertinent to today's visit, and charting.   Recommend increasing BuSpar.  She is willing to try it. Continue CPAP use.  Increase BuSpar 5 mg to 1 p.o. twice daily.   Continue Xanax 0.5 mg, 1/2-1 p.o. 3 times daily as needed. Hasn't taken in a long time.  Return in 2 months.  Donnal Moat, PA-C

## 2021-11-16 ENCOUNTER — Telehealth: Payer: Medicare HMO | Admitting: Physician Assistant

## 2021-11-16 ENCOUNTER — Other Ambulatory Visit (HOSPITAL_COMMUNITY): Payer: Self-pay

## 2021-11-16 DIAGNOSIS — J019 Acute sinusitis, unspecified: Secondary | ICD-10-CM

## 2021-11-16 DIAGNOSIS — B9689 Other specified bacterial agents as the cause of diseases classified elsewhere: Secondary | ICD-10-CM | POA: Diagnosis not present

## 2021-11-16 MED ORDER — DOXYCYCLINE HYCLATE 100 MG PO TABS
100.0000 mg | ORAL_TABLET | Freq: Two times a day (BID) | ORAL | 0 refills | Status: DC
  Filled 2021-11-16: qty 20, 10d supply, fill #0

## 2021-11-16 NOTE — Progress Notes (Signed)

## 2021-11-23 ENCOUNTER — Other Ambulatory Visit (HOSPITAL_COMMUNITY): Payer: Self-pay

## 2021-11-24 ENCOUNTER — Other Ambulatory Visit (HOSPITAL_COMMUNITY): Payer: Self-pay

## 2021-11-24 MED ORDER — ROSUVASTATIN CALCIUM 5 MG PO TABS
5.0000 mg | ORAL_TABLET | ORAL | 0 refills | Status: DC
Start: 2021-11-24 — End: 2022-05-18
  Filled 2021-11-24: qty 13, 91d supply, fill #0

## 2021-11-25 ENCOUNTER — Other Ambulatory Visit (HOSPITAL_COMMUNITY): Payer: Self-pay

## 2021-11-25 MED ORDER — VERAPAMIL HCL ER 300 MG PO CP24
ORAL_CAPSULE | ORAL | 0 refills | Status: DC
  Filled 2021-11-25: qty 90, 90d supply, fill #0

## 2021-11-26 ENCOUNTER — Other Ambulatory Visit (HOSPITAL_COMMUNITY): Payer: Self-pay

## 2021-11-26 ENCOUNTER — Ambulatory Visit (INDEPENDENT_AMBULATORY_CARE_PROVIDER_SITE_OTHER): Payer: Medicare HMO | Admitting: Adult Health

## 2021-11-26 ENCOUNTER — Encounter (INDEPENDENT_AMBULATORY_CARE_PROVIDER_SITE_OTHER): Payer: Self-pay | Admitting: Adult Health

## 2021-11-26 VITALS — BP 110/74 | HR 64 | Temp 97.6°F | Ht 62.0 in | Wt 234.0 lb

## 2021-11-26 DIAGNOSIS — Z6841 Body Mass Index (BMI) 40.0 and over, adult: Secondary | ICD-10-CM | POA: Diagnosis not present

## 2021-11-26 DIAGNOSIS — E1159 Type 2 diabetes mellitus with other circulatory complications: Secondary | ICD-10-CM

## 2021-11-26 DIAGNOSIS — Z7985 Long-term (current) use of injectable non-insulin antidiabetic drugs: Secondary | ICD-10-CM

## 2021-11-26 DIAGNOSIS — E669 Obesity, unspecified: Secondary | ICD-10-CM | POA: Diagnosis not present

## 2021-11-26 DIAGNOSIS — E1169 Type 2 diabetes mellitus with other specified complication: Secondary | ICD-10-CM | POA: Diagnosis not present

## 2021-11-26 DIAGNOSIS — E559 Vitamin D deficiency, unspecified: Secondary | ICD-10-CM | POA: Diagnosis not present

## 2021-11-26 DIAGNOSIS — E785 Hyperlipidemia, unspecified: Secondary | ICD-10-CM | POA: Diagnosis not present

## 2021-11-26 DIAGNOSIS — I152 Hypertension secondary to endocrine disorders: Secondary | ICD-10-CM

## 2021-11-26 MED ORDER — VITAMIN D (ERGOCALCIFEROL) 1.25 MG (50000 UNIT) PO CAPS
ORAL_CAPSULE | ORAL | 0 refills | Status: DC
  Filled 2021-11-26: qty 12, 84d supply, fill #0

## 2021-11-27 LAB — COMPREHENSIVE METABOLIC PANEL
ALT: 88 IU/L — ABNORMAL HIGH (ref 0–32)
AST: 62 IU/L — ABNORMAL HIGH (ref 0–40)
Albumin/Globulin Ratio: 2.4 — ABNORMAL HIGH (ref 1.2–2.2)
Albumin: 4.6 g/dL (ref 3.9–4.9)
Alkaline Phosphatase: 79 IU/L (ref 44–121)
BUN/Creatinine Ratio: 19 (ref 12–28)
BUN: 14 mg/dL (ref 8–27)
Bilirubin Total: 0.4 mg/dL (ref 0.0–1.2)
CO2: 24 mmol/L (ref 20–29)
Calcium: 9.4 mg/dL (ref 8.7–10.3)
Chloride: 103 mmol/L (ref 96–106)
Creatinine, Ser: 0.75 mg/dL (ref 0.57–1.00)
Globulin, Total: 1.9 g/dL (ref 1.5–4.5)
Glucose: 118 mg/dL — ABNORMAL HIGH (ref 70–99)
Potassium: 4 mmol/L (ref 3.5–5.2)
Sodium: 143 mmol/L (ref 134–144)
Total Protein: 6.5 g/dL (ref 6.0–8.5)
eGFR: 87 mL/min/{1.73_m2} (ref >59)

## 2021-11-27 LAB — LIPID PANEL
Chol/HDL Ratio: 4.4 ratio (ref 0.0–4.4)
Cholesterol, Total: 195 mg/dL (ref 100–199)
HDL: 44 mg/dL (ref >39)
LDL Chol Calc (NIH): 111 mg/dL — ABNORMAL HIGH (ref 0–99)
Triglycerides: 228 mg/dL — ABNORMAL HIGH (ref 0–149)
VLDL Cholesterol Cal: 40 mg/dL (ref 5–40)

## 2021-11-27 LAB — INSULIN, RANDOM: INSULIN: 37.9 u[IU]/mL — ABNORMAL HIGH (ref 2.6–24.9)

## 2021-11-27 LAB — HEMOGLOBIN A1C
Est. average glucose Bld gHb Est-mCnc: 137 mg/dL
Hgb A1c MFr Bld: 6.4 % — ABNORMAL HIGH (ref 4.8–5.6)

## 2021-11-27 LAB — VITAMIN D 25 HYDROXY (VIT D DEFICIENCY, FRACTURES): Vit D, 25-Hydroxy: 34.5 ng/mL (ref 30.0–100.0)

## 2021-12-02 DIAGNOSIS — G4733 Obstructive sleep apnea (adult) (pediatric): Secondary | ICD-10-CM | POA: Diagnosis not present

## 2021-12-02 DIAGNOSIS — G4730 Sleep apnea, unspecified: Secondary | ICD-10-CM | POA: Diagnosis not present

## 2021-12-07 DIAGNOSIS — I152 Hypertension secondary to endocrine disorders: Secondary | ICD-10-CM | POA: Insufficient documentation

## 2021-12-07 DIAGNOSIS — E119 Type 2 diabetes mellitus without complications: Secondary | ICD-10-CM | POA: Insufficient documentation

## 2021-12-07 DIAGNOSIS — E1169 Type 2 diabetes mellitus with other specified complication: Secondary | ICD-10-CM | POA: Insufficient documentation

## 2021-12-07 NOTE — Progress Notes (Signed)
Chief Complaint:   OBESITY Rhonda Aguilar is here to discuss her progress with her obesity treatment plan along with follow-up of her obesity related diagnoses. [name] is on the Category 2 Plan and states she is following her eating plan approximately 30% of the time. [name] states she is not exercising.   Today's visit was #: 72 Starting weight: 231 lbs Starting date: 12/08/2017 Today's weight: 234 lbs Today's date: 11/26/2021 Total lbs lost to date: 0 Total lbs lost since last in-office visit: 0  Interim History:  She estimate to have been following prescribed Cat 2 meal approximately 30% since last OV.  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   Anticipated retirement is October 22, 2022. 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   Subjective:   1. Hypertension associated with type 2 diabetes mellitus ([place]) Blood pressure is at goal at office visit.   She is on HCTZ 25 mg, 1/2 tablet prn for edema. She estimates to use diuretic once per month.  She takes Valsartan 320 mg and  Verapamil 300 mg daily.   2. Type 2 diabetes mellitus with other specified complication, without long-term current use of insulin ([name]) Ozempic 1 mg once weekly for the last 2 months.   She denies mass in neck, dysphagia, dyspepsia, persistent hoarseness, abdominal pain, or N/V/Constipation. She reports fasting blood glucose levels 130-150.   Lab Results  Component Value Date   HGBA1C 6.4 (H) 11/26/2021   HGBA1C 6.7 (H) 02/09/2021   HGBA1C 6.2 (H) 04/08/2020    3. Hyperlipidemia associated with type 2 diabetes mellitus ([place]) She is taking Crestor 5 mg weekly.  She denies acute cardiac sx's.  4. Vitamin D deficiency 02/09/21 Vit D Level-42.3- sub therapeutic level. She is on once weekly Ergocalciferol. She denies N/VMuscle Weakness.  Assessment/Plan:   1. Hypertension associated with type 2 diabetes mellitus ([place]) Check labs  -  Comprehensive metabolic panel  2. Type 2 diabetes mellitus with other specified complication, without long-term current use of insulin ([name]) Continue weekly Ozempic 1 mg.  Check labs today.   - Hemoglobin A1c - Insulin, random  3. Hyperlipidemia associated with type 2 diabetes mellitus ([place]) Check labs today.   - Lipid panel  4. Vitamin D deficiency Check labs today.   - VITAMIN D 25 Hydroxy (Vit-D Deficiency, Fractures)  Refill - Vitamin D, Ergocalciferol, (DRISDOL) 1.25 MG (50000 UNIT) CAPS capsule; TAKE 1 CAPSULE BY MOUTH ONCE A WEEK.  Dispense: 12 capsule; Refill: 0  5. Obesity, current BMI 42.8 Rhonda Aguilar is currently in the action stage of change. As such, her goal is to continue with weight loss efforts. She has agreed to the Category 2 Plan.   Exercise goals:  Increase daily walking.  Behavioral modification strategies: increasing lean protein intake, decreasing simple carbohydrates, increasing water intake, and planning for success.  [name] has agreed to follow-up with our clinic in 3-4 weeks. She was informed of the importance of frequent follow-up visits to maximize her success with intensive lifestyle modifications for her multiple health conditions.   [name] was informed we would discuss her lab results at her next visit unless there is a critical issue that needs to be addressed sooner. [name] agreed to keep her next visit at the agreed upon time to discuss these results.  Objective:   Blood pressure 110/74, pulse 64, temperature 97.6 F (36.4 C), height '5\' 2"'$  (1.575 m), weight 234 lb (106.1 kg), SpO2 97 %.  Body mass index is 42.8 kg/m.  General: Cooperative, alert, well developed, in no acute distress. HEENT: Conjunctivae and lids unremarkable. Cardiovascular: Regular rhythm.  Lungs: Normal work of breathing. Neurologic: No focal deficits.   Lab Results  Component Value Date   CREATININE 0.75 11/26/2021   BUN 14 11/26/2021   NA 143 11/26/2021   K 4.0  11/26/2021   CL 103 11/26/2021   CO2 24 11/26/2021   Lab Results  Component Value Date   ALT 88 (H) 11/26/2021   AST 62 (H) 11/26/2021   ALKPHOS 79 11/26/2021   BILITOT 0.4 11/26/2021   Lab Results  Component Value Date   HGBA1C 6.4 (H) 11/26/2021   HGBA1C 6.7 (H) 02/09/2021   HGBA1C 6.2 (H) 04/08/2020   HGBA1C 7.0 (H) 08/22/2019   HGBA1C 6.7 (H) 04/19/2019   Lab Results  Component Value Date   INSULIN 37.9 (H) 11/26/2021   INSULIN 29.1 (H) 02/09/2021   INSULIN 43.2 (H) 04/08/2020   INSULIN 33.6 (H) 08/22/2019   INSULIN 40.0 (H) 04/19/2019   Lab Results  Component Value Date   TSH 2.550 12/08/2017   Lab Results  Component Value Date   CHOL 195 11/26/2021   HDL 44 11/26/2021   LDLCALC 111 (H) 11/26/2021   TRIG 228 (H) 11/26/2021   CHOLHDL 4.4 11/26/2021   Lab Results  Component Value Date   VD25OH 34.5 11/26/2021   VD25OH 42.3 02/09/2021   VD25OH 46.6 04/08/2020   Lab Results  Component Value Date   WBC 7.6 09/05/2019   HGB 15.0 09/05/2019   HCT 47.0 (H) 09/05/2019   MCV 85.0 09/05/2019   PLT 228 09/05/2019   No results found for: "IRON", "TIBC", "FERRITIN"  Attestation Statements:   Reviewed by clinician on day of visit: allergies, medications, problem list, medical history, surgical history, family history, social history, and previous encounter notes.  I, Davy Pique, RMA, am acting as Location manager for Mina Marble, NP.  I have reviewed the above documentation for accuracy and completeness, and I agree with the above. -  Shatiqua Heroux d. Kindrick Lankford, NP-C

## 2021-12-16 DIAGNOSIS — M779 Enthesopathy, unspecified: Secondary | ICD-10-CM | POA: Diagnosis not present

## 2021-12-23 ENCOUNTER — Ambulatory Visit (INDEPENDENT_AMBULATORY_CARE_PROVIDER_SITE_OTHER): Payer: Medicare HMO | Admitting: Family Medicine

## 2021-12-23 ENCOUNTER — Other Ambulatory Visit (HOSPITAL_COMMUNITY): Payer: Self-pay

## 2021-12-23 DIAGNOSIS — E1165 Type 2 diabetes mellitus with hyperglycemia: Secondary | ICD-10-CM | POA: Diagnosis not present

## 2021-12-23 DIAGNOSIS — R69 Illness, unspecified: Secondary | ICD-10-CM | POA: Diagnosis not present

## 2021-12-23 DIAGNOSIS — I10 Essential (primary) hypertension: Secondary | ICD-10-CM | POA: Diagnosis not present

## 2021-12-23 DIAGNOSIS — G43109 Migraine with aura, not intractable, without status migrainosus: Secondary | ICD-10-CM | POA: Diagnosis not present

## 2021-12-23 DIAGNOSIS — Z0000 Encounter for general adult medical examination without abnormal findings: Secondary | ICD-10-CM | POA: Diagnosis not present

## 2021-12-23 DIAGNOSIS — Z23 Encounter for immunization: Secondary | ICD-10-CM | POA: Diagnosis not present

## 2021-12-23 DIAGNOSIS — E782 Mixed hyperlipidemia: Secondary | ICD-10-CM | POA: Diagnosis not present

## 2021-12-23 MED ORDER — VERAPAMIL HCL ER 300 MG PO CP24
300.0000 mg | ORAL_CAPSULE | Freq: Every day | ORAL | 3 refills | Status: DC
  Filled 2021-12-23 – 2022-02-25 (×2): qty 90, 90d supply, fill #0
  Filled 2022-05-25: qty 90, 90d supply, fill #1

## 2021-12-23 MED ORDER — VALSARTAN 320 MG PO TABS
320.0000 mg | ORAL_TABLET | Freq: Every day | ORAL | 3 refills | Status: DC
  Filled 2021-12-23: qty 90, 90d supply, fill #0
  Filled 2022-04-01: qty 90, 90d supply, fill #1
  Filled 2022-06-28: qty 90, 90d supply, fill #2

## 2021-12-24 ENCOUNTER — Other Ambulatory Visit (HOSPITAL_COMMUNITY): Payer: Self-pay

## 2021-12-25 ENCOUNTER — Other Ambulatory Visit (HOSPITAL_COMMUNITY): Payer: Self-pay

## 2021-12-25 MED ORDER — CLOBETASOL PROPIONATE 0.05 % EX OINT
TOPICAL_OINTMENT | CUTANEOUS | 0 refills | Status: DC
  Filled 2021-12-25: qty 15, 7d supply, fill #0

## 2021-12-28 ENCOUNTER — Other Ambulatory Visit (HOSPITAL_COMMUNITY): Payer: Self-pay

## 2021-12-30 ENCOUNTER — Other Ambulatory Visit (HOSPITAL_COMMUNITY): Payer: Self-pay

## 2021-12-31 ENCOUNTER — Other Ambulatory Visit (HOSPITAL_COMMUNITY): Payer: Self-pay

## 2021-12-31 DIAGNOSIS — K760 Fatty (change of) liver, not elsewhere classified: Secondary | ICD-10-CM | POA: Diagnosis not present

## 2021-12-31 DIAGNOSIS — K219 Gastro-esophageal reflux disease without esophagitis: Secondary | ICD-10-CM | POA: Diagnosis not present

## 2021-12-31 DIAGNOSIS — Z86010 Personal history of colonic polyps: Secondary | ICD-10-CM | POA: Diagnosis not present

## 2021-12-31 MED ORDER — DEXLANSOPRAZOLE 60 MG PO CPDR
60.0000 mg | DELAYED_RELEASE_CAPSULE | Freq: Every day | ORAL | 4 refills | Status: DC
  Filled 2021-12-31: qty 30, 30d supply, fill #0
  Filled 2022-02-25: qty 30, 30d supply, fill #1
  Filled 2022-05-18: qty 30, 30d supply, fill #2
  Filled 2022-08-25: qty 30, 30d supply, fill #3
  Filled 2022-11-24: qty 30, 30d supply, fill #4

## 2022-01-01 ENCOUNTER — Other Ambulatory Visit (HOSPITAL_COMMUNITY): Payer: Self-pay

## 2022-01-02 ENCOUNTER — Other Ambulatory Visit (HOSPITAL_COMMUNITY): Payer: Self-pay

## 2022-01-02 DIAGNOSIS — G4733 Obstructive sleep apnea (adult) (pediatric): Secondary | ICD-10-CM | POA: Diagnosis not present

## 2022-01-02 DIAGNOSIS — G4730 Sleep apnea, unspecified: Secondary | ICD-10-CM | POA: Diagnosis not present

## 2022-01-06 ENCOUNTER — Other Ambulatory Visit (HOSPITAL_COMMUNITY): Payer: Self-pay

## 2022-01-07 ENCOUNTER — Ambulatory Visit: Payer: Medicare HMO | Admitting: Physician Assistant

## 2022-01-07 ENCOUNTER — Encounter: Payer: Self-pay | Admitting: Physician Assistant

## 2022-01-07 DIAGNOSIS — G4733 Obstructive sleep apnea (adult) (pediatric): Secondary | ICD-10-CM | POA: Diagnosis not present

## 2022-01-07 DIAGNOSIS — F411 Generalized anxiety disorder: Secondary | ICD-10-CM

## 2022-01-07 DIAGNOSIS — R69 Illness, unspecified: Secondary | ICD-10-CM | POA: Diagnosis not present

## 2022-01-07 NOTE — Progress Notes (Signed)
[facility]  Patient ID: Rhonda Aguilar,  MRN: [id number]  PCP: Glenis Smoker, MD  Date of Evaluation: 01/07/2022 Time spent:20 minutes  Chief Complaint:  Chief Complaint   Anxiety; Follow-up    HISTORY/CURRENT STATUS: For f/u of anxiety.  Didn't take the higher dose of Buspar but for 2.5 weeks b/c she had nausea. Also had a sinus infection at the time. Not sure if the Doxycycline, the infection, or the Buspar caused it.  When she went back to 5 mg daily, she had no more nausea. And felt better with anxiety so she stayed at 5 mg daily.   Patient is able to enjoy things.  Energy and motivation are good.  Work is going ok, still works 2 days per week.  No extreme sadness, tearfulness, or feelings of hopelessness.  Sleeps well most of the time. ADLs and personal hygiene are normal.   Denies any changes in concentration, making decisions, or remembering things.  Appetite has not changed.  Weight is stable.  Denies suicidal or homicidal thoughts.  Denies dizziness, syncope, seizures, numbness, tingling, tremor, unsteady gait, slurred speech, confusion. Denies muscle or joint pain, stiffness, or dystonia.  Individual Medical History/ Review of Systems: Changes? :No     Past medications for mental health diagnoses include:  Pristiq, Zoloft caused her to be more depressed.  Allergies: Glyburide, Metformin and related, Saxenda [liraglutide -weight management], Codeine, Penicillins, and Sulfa antibiotics  Current Medications:  Current Outpatient Medications:    busPIRone (BUSPAR) 5 MG tablet, Take 1 tablet (5 mg total) by mouth 2 (two) times daily. (Patient taking differently: Take 5 mg by mouth daily.), Disp: 180 tablet, Rfl: 3   cetirizine (ZYRTEC) 10 MG tablet, Take 10 mg by mouth at bedtime. , Disp: , Rfl:    clobetasol ointment (TEMOVATE) 0.05 %, Apply to affected area twice a day as needed, Disp: 15 g, Rfl: 0   dexlansoprazole (DEXILANT) 60 MG capsule, TAKE 1  CAPSULE BY MOUTH ONCE DAILY  20 MINUTES BEFORE BREAKFAST, Disp: 90 capsule, Rfl: 4   dexlansoprazole (DEXILANT) 60 MG capsule, Take 1 capsule (60 mg total) by mouth daily 20 minutes before breakfast, Disp: 90 capsule, Rfl: 4   hydrochlorothiazide (HYDRODIURIL) 25 MG tablet, Take 12.5 mg by mouth as needed. , Disp: , Rfl:    ibuprofen (ADVIL) 200 MG tablet, Take 600 mg by mouth every 8 (eight) hours as needed for mild pain or moderate pain. , Disp: , Rfl:    meclizine (ANTIVERT) 25 MG tablet, Take 1 tablet by mouth  as needed three times a day for 7 days., Disp: 21 tablet, Rfl: 0   Multiple Vitamin (MULTIVITAMIN) tablet, Take 1 tablet by mouth daily. Ultra gastric tabs / supplements, Disp: , Rfl:    rosuvastatin (CRESTOR) 5 MG tablet, Take 1 tablet (5 mg total) by mouth once a week., Disp: 13 tablet, Rfl: 0   Semaglutide, 1 MG/DOSE, (OZEMPIC, 1 MG/DOSE,) 4 MG/3ML SOPN, inject '1mg'$  under the skin once weekly, Disp: 12 mL, Rfl: 0   valsartan (DIOVAN) 320 MG tablet, TAKE 1 TABLET BY MOUTH ONCE A DAY, Disp: 90 tablet, Rfl: 3   valsartan (DIOVAN) 320 MG tablet, Take 1 tablet (320 mg total) by mouth daily., Disp: 90 tablet, Rfl: 3   Verapamil HCl CR 300 MG CP24, Take 1 capsule (300 mg total) by mouth daily., Disp: 90 capsule, Rfl: 3   doxycycline (VIBRA-TABS) 100 MG tablet, Take 1 tablet (100 mg total) by mouth 2 (two) times  daily. (Patient not taking: Reported on 01/07/2022), Disp: 20 tablet, Rfl: 0   Vitamin D, Ergocalciferol, (DRISDOL) 1.25 MG (50000 UNIT) CAPS capsule, TAKE 1 CAPSULE BY MOUTH ONCE A WEEK., Disp: 12 capsule, Rfl: 0 No current facility-administered medications for this visit.  Facility-Administered Medications Ordered in Other Visits:    sodium chloride flush (NS) 0.9 % injection 10 mL, 10 mL, Intravenous, PRN, Radford Pax, Traci R, MD, 20 mL at 11/15/19 9977 Medication Side Effects: none  Family Medical/ Social History: Changes?  no   MENTAL HEALTH EXAM:  There were no vitals taken for  this visit.There is no height or weight on file to calculate BMI.  General Appearance: Casual, Neat, Well Groomed and Obese  Eye Contact:  Good  Speech:  Clear and Coherent, Normal Rate, and Talkative  Volume:  Normal  Mood:  Euthymic  Affect:  Appropriate  Thought Process:  Goal Directed and Descriptions of Associations: Circumstantial  Orientation:  Full (Time, Place, and Person)  Thought Content: Logical   Suicidal Thoughts:  No  Homicidal Thoughts:  No  Memory:  WNL  Judgement:  Good  Insight:  Good  Psychomotor Activity:  Normal  Concentration:  Concentration: Good and Attention Span: Good  Recall:  Good  Fund of Knowledge: Good  Language: Good  Assets:  Desire for Improvement  ADL's:  Intact  Cognition: WNL  Prognosis:  Good    DIAGNOSES:    ICD-10-CM   1. Generalized anxiety disorder  F41.1     2. Obstructive sleep apnea  G47.33       Receiving Psychotherapy: No   RECOMMENDATIONS:  PDMP reviewed.  Last Xanax filled 03/03/2020. I provided 20 minutes of face to face time during this encounter, including time spent before and after the visit in records review, medical decision making, counseling pertinent to today's visit, and charting.   She is doing better despite not having increased the BuSpar for very long.  So we will stay at 5 mg daily.  She could try taking 1/2 to 1 pill daily in addition, as needed, although I personally do not think as needed dosing helps very much, some people do though so it is worth a try when she is more anxious and does not want to take the Xanax.  Continue BuSpar 5 mg daily. Continue Xanax 0.5 mg, 1/2-1 p.o. 3 times daily as needed. Hasn't taken in a long time.  Return in 9 months.  Donnal Moat, PA-C

## 2022-01-12 ENCOUNTER — Other Ambulatory Visit (HOSPITAL_COMMUNITY): Payer: Self-pay

## 2022-01-12 DIAGNOSIS — L209 Atopic dermatitis, unspecified: Secondary | ICD-10-CM | POA: Diagnosis not present

## 2022-01-12 DIAGNOSIS — B354 Tinea corporis: Secondary | ICD-10-CM | POA: Diagnosis not present

## 2022-01-12 MED ORDER — FLUCONAZOLE 150 MG PO TABS
150.0000 mg | ORAL_TABLET | ORAL | 0 refills | Status: AC
  Filled 2022-01-12: qty 4, 28d supply, fill #0

## 2022-01-19 DIAGNOSIS — E119 Type 2 diabetes mellitus without complications: Secondary | ICD-10-CM | POA: Diagnosis not present

## 2022-01-25 ENCOUNTER — Encounter (INDEPENDENT_AMBULATORY_CARE_PROVIDER_SITE_OTHER): Payer: Self-pay | Admitting: Family Medicine

## 2022-01-25 ENCOUNTER — Other Ambulatory Visit (HOSPITAL_COMMUNITY): Payer: Self-pay

## 2022-01-25 ENCOUNTER — Ambulatory Visit (INDEPENDENT_AMBULATORY_CARE_PROVIDER_SITE_OTHER): Payer: Medicare HMO | Admitting: Family Medicine

## 2022-01-25 VITALS — BP 143/83 | HR 76 | Temp 97.9°F | Ht 62.0 in | Wt 227.0 lb

## 2022-01-25 DIAGNOSIS — Z7985 Long-term (current) use of injectable non-insulin antidiabetic drugs: Secondary | ICD-10-CM

## 2022-01-25 DIAGNOSIS — E669 Obesity, unspecified: Secondary | ICD-10-CM

## 2022-01-25 DIAGNOSIS — I10 Essential (primary) hypertension: Secondary | ICD-10-CM

## 2022-01-25 DIAGNOSIS — Z6841 Body Mass Index (BMI) 40.0 and over, adult: Secondary | ICD-10-CM

## 2022-01-25 DIAGNOSIS — E1169 Type 2 diabetes mellitus with other specified complication: Secondary | ICD-10-CM

## 2022-01-25 DIAGNOSIS — E559 Vitamin D deficiency, unspecified: Secondary | ICD-10-CM

## 2022-01-25 MED ORDER — VITAMIN D (ERGOCALCIFEROL) 1.25 MG (50000 UNIT) PO CAPS
50000.0000 [IU] | ORAL_CAPSULE | ORAL | 0 refills | Status: DC
  Filled 2022-01-25: qty 12, 84d supply, fill #0

## 2022-02-01 DIAGNOSIS — G4730 Sleep apnea, unspecified: Secondary | ICD-10-CM | POA: Diagnosis not present

## 2022-02-01 DIAGNOSIS — G4733 Obstructive sleep apnea (adult) (pediatric): Secondary | ICD-10-CM | POA: Diagnosis not present

## 2022-02-03 NOTE — Progress Notes (Unsigned)
Chief Complaint:   OBESITY Rhonda Aguilar is here to discuss her progress with her obesity treatment plan along with follow-up of her obesity related diagnoses. Rhonda Aguilar is on the Category 2 Plan and states she is following her eating plan approximately 50% of the time. Rhonda Aguilar states she is doing 0 minutes 0 times per week.  Today's visit was #: 48 Starting weight: 231 lbs Starting date: 12/08/2017 Today's weight: 227 lbs Today's date: 01/25/2022 Total lbs lost to date: 4 Total lbs lost since last in-office visit: 7  Interim History: Rhonda Aguilar has been doing well with her weight loss even over Halloween and Thanksgiving. She is mostly doing portion control and making smarter choices, and she is trying  to increase her protein with using [employer] protein drinks.   Subjective:   1. Vitamin D deficiency Rhonda Aguilar is stable on Vitamin D prescription, and she denies nausea, vomiting, or muscle weakness.   2. Type 2 diabetes mellitus with other specified complication, without long-term current use of insulin ([name]) Rhonda Aguilar is on Ozempic and she is working on her diet and weight loss.   3. Essential hypertension Rhonda Aguilar's blood pressure is mildly elevated. She feels this is related to pain. Her blood pressures at home are within normal limits.   Assessment/Plan:   1. Vitamin D deficiency We will refill prescription Vitamin D for 90 days. Abigaelle will follow-up for routine testing of Vitamin D, at least 2-3 times per year to avoid over-replacement.  - Vitamin D, Ergocalciferol, (DRISDOL) 1.25 MG (50000 UNIT) CAPS capsule; Take 1 capsule (50,000 Units total) by mouth once a week.  Dispense: 12 capsule; Refill: 0  2. Type 2 diabetes mellitus with other specified complication, without long-term current use of insulin ([name]) Rhonda Aguilar will continue Ozempic, and she will continue with her diet, exercise, and weight loss to control her glucose levels.   3. Essential hypertension Rhonda Aguilar will continue with her diet and  exercise, and she will continue to check her blood pressure at home.   4. Obesity, Current BMI 41.6 Rhonda Aguilar is currently in the action stage of change. As such, her goal is to continue with weight loss efforts. She has agreed to keeping a food journal and adhering to recommended goals of 1200 calories and 90+ grams of protein.   Behavioral modification strategies: increasing lean protein intake and holiday eating strategies .  Rhonda Aguilar has agreed to follow-up with our clinic in 4 weeks. She was informed of the importance of frequent follow-up visits to maximize her success with intensive lifestyle modifications for her multiple health conditions.   Objective:   Blood pressure (!) 143/83, pulse 76, temperature 97.9 F (36.6 C), height '5\' 2"'$  (1.575 m), weight 227 lb (103 kg), SpO2 97 %. Body mass index is 41.52 kg/m.  General: Cooperative, alert, well developed, in no acute distress. HEENT: Conjunctivae and lids unremarkable. Cardiovascular: Regular rhythm.  Lungs: Normal work of breathing. Neurologic: No focal deficits.   Lab Results  Component Value Date   CREATININE 0.75 11/26/2021   BUN 14 11/26/2021   NA 143 11/26/2021   K 4.0 11/26/2021   CL 103 11/26/2021   CO2 24 11/26/2021   Lab Results  Component Value Date   ALT 88 (H) 11/26/2021   AST 62 (H) 11/26/2021   ALKPHOS 79 11/26/2021   BILITOT 0.4 11/26/2021   Lab Results  Component Value Date   HGBA1C 6.4 (H) 11/26/2021   HGBA1C 6.7 (H) 02/09/2021   HGBA1C 6.2 (H) 04/08/2020  HGBA1C 7.0 (H) 08/22/2019   HGBA1C 6.7 (H) 04/19/2019   Lab Results  Component Value Date   INSULIN 37.9 (H) 11/26/2021   INSULIN 29.1 (H) 02/09/2021   INSULIN 43.2 (H) 04/08/2020   INSULIN 33.6 (H) 08/22/2019   INSULIN 40.0 (H) 04/19/2019   Lab Results  Component Value Date   TSH 2.550 12/08/2017   Lab Results  Component Value Date   CHOL 195 11/26/2021   HDL 44 11/26/2021   LDLCALC 111 (H) 11/26/2021   TRIG 228 (H) 11/26/2021    CHOLHDL 4.4 11/26/2021   Lab Results  Component Value Date   VD25OH 34.5 11/26/2021   VD25OH 42.3 02/09/2021   VD25OH 46.6 04/08/2020   Lab Results  Component Value Date   WBC 7.6 09/05/2019   HGB 15.0 09/05/2019   HCT 47.0 (H) 09/05/2019   MCV 85.0 09/05/2019   PLT 228 09/05/2019   No results found for: "IRON", "TIBC", "FERRITIN"  Attestation Statements:   Reviewed by clinician on day of visit: allergies, medications, problem list, medical history, surgical history, family history, social history, and previous encounter notes.   I, Trixie Dredge, am acting as transcriptionist for Dennard Nip, MD.  I have reviewed the above documentation for accuracy and completeness, and I agree with the above. -  Dennard Nip, MD

## 2022-02-05 ENCOUNTER — Other Ambulatory Visit (HOSPITAL_BASED_OUTPATIENT_CLINIC_OR_DEPARTMENT_OTHER): Payer: Self-pay

## 2022-02-05 MED ORDER — COMIRNATY 30 MCG/0.3ML IM SUSY
PREFILLED_SYRINGE | INTRAMUSCULAR | 0 refills | Status: DC
  Filled 2022-02-05: qty 0.3, 1d supply, fill #0

## 2022-02-16 ENCOUNTER — Other Ambulatory Visit: Payer: Self-pay

## 2022-02-17 ENCOUNTER — Other Ambulatory Visit (HOSPITAL_COMMUNITY): Payer: Self-pay

## 2022-02-18 DIAGNOSIS — Z0100 Encounter for examination of eyes and vision without abnormal findings: Secondary | ICD-10-CM | POA: Diagnosis not present

## 2022-02-23 ENCOUNTER — Encounter (INDEPENDENT_AMBULATORY_CARE_PROVIDER_SITE_OTHER): Payer: Self-pay | Admitting: Family Medicine

## 2022-02-23 ENCOUNTER — Ambulatory Visit (INDEPENDENT_AMBULATORY_CARE_PROVIDER_SITE_OTHER): Payer: Medicare HMO | Admitting: Family Medicine

## 2022-02-23 ENCOUNTER — Other Ambulatory Visit (HOSPITAL_COMMUNITY): Payer: Self-pay

## 2022-02-23 VITALS — BP 180/95 | HR 94 | Temp 97.9°F | Ht 62.0 in | Wt 231.0 lb

## 2022-02-23 DIAGNOSIS — E669 Obesity, unspecified: Secondary | ICD-10-CM | POA: Diagnosis not present

## 2022-02-23 DIAGNOSIS — Z6841 Body Mass Index (BMI) 40.0 and over, adult: Secondary | ICD-10-CM | POA: Diagnosis not present

## 2022-02-23 DIAGNOSIS — K5900 Constipation, unspecified: Secondary | ICD-10-CM

## 2022-02-23 DIAGNOSIS — I10 Essential (primary) hypertension: Secondary | ICD-10-CM | POA: Diagnosis not present

## 2022-02-23 DIAGNOSIS — E559 Vitamin D deficiency, unspecified: Secondary | ICD-10-CM

## 2022-02-23 MED ORDER — POLYETHYLENE GLYCOL 3350 17 GM/SCOOP PO POWD
17.0000 g | Freq: Every day | ORAL | 0 refills | Status: DC | PRN
  Filled 2022-02-23: qty 476, 28d supply, fill #0

## 2022-02-23 MED ORDER — VITAMIN D (ERGOCALCIFEROL) 1.25 MG (50000 UNIT) PO CAPS
50000.0000 [IU] | ORAL_CAPSULE | ORAL | 0 refills | Status: DC
  Filled 2022-02-23: qty 12, 84d supply, fill #0

## 2022-02-24 ENCOUNTER — Other Ambulatory Visit (HOSPITAL_COMMUNITY): Payer: Self-pay

## 2022-02-25 ENCOUNTER — Other Ambulatory Visit (HOSPITAL_COMMUNITY): Payer: Self-pay

## 2022-02-26 ENCOUNTER — Encounter (HOSPITAL_COMMUNITY): Payer: Self-pay | Admitting: Pharmacist

## 2022-02-26 ENCOUNTER — Other Ambulatory Visit (HOSPITAL_COMMUNITY): Payer: Self-pay

## 2022-03-08 NOTE — Progress Notes (Unsigned)
Chief Complaint:   OBESITY Rhonda Aguilar is here to discuss her progress with her obesity treatment plan along with follow-up of her obesity related diagnoses. Rhonda Aguilar is on keeping a food journal and adhering to recommended goals of 1200 calories and 90+ grams of protein and states she is following her eating plan approximately 50% of the time. Rhonda Aguilar states she is walking for 15 minutes 2 times per week.  Today's visit was #: 45 Starting weight: 231 lbs Starting date: 12/08/2017 Today's weight: 231 lbs Today's date: 02/23/2022 Total lbs lost to date: 0 Total lbs lost since last in-office visit: 0  Interim History: Rhonda Aguilar did some celebration eating over the holidays.  She worked on portion control, but she is getting back on track with her eating plan.  Subjective:   1. Constipation, unspecified constipation type Rhonda Aguilar notes increased constipation recently, likely related to her GLP-1.  She has had limited benefit from stool softeners.  2. Vitamin D deficiency Rhonda Aguilar is on vitamin D, and she denies nausea, vomiting, or muscle weakness.  3. Essential hypertension Rhonda Aguilar's blood pressure is elevated today and it is not controlled.  She took her blood pressure medications late today.  Assessment/Plan:   1. Constipation, unspecified constipation type Rhonda Aguilar agreed to start MiraLAX 17 g daily with no refills, and she will increase her water intake.  - polyethylene glycol powder (GLYCOLAX/MIRALAX) 17 GM/SCOOP powder; Take 17 g by mouth daily as needed.  Dispense: 3350 g; Refill: 0  2. Vitamin D deficiency Rhonda Aguilar will continue prescription vitamin D, and we will refill for 90 days.  - Vitamin D, Ergocalciferol, (DRISDOL) 1.25 MG (50000 UNIT) CAPS capsule; Take 1 capsule (50,000 Units total) by mouth once a week.  Dispense: 12 capsule; Refill: 0  3. Essential hypertension Rhonda Aguilar will continue with her diet and weight loss, and we will recheck her blood pressure at her next visit.  4. Obesity,  Current BMI 42.2 Rhonda Aguilar is currently in the action stage of change. As such, her goal is to continue with weight loss efforts. She has agreed to the Category 2 Plan + a protein drink in the morning.   Exercise goals: As is.   Behavioral modification strategies: increasing lean protein intake.  Rhonda Aguilar has agreed to follow-up with our clinic in 4 weeks. She was informed of the importance of frequent follow-up visits to maximize her success with intensive lifestyle modifications for her multiple health conditions.   Objective:   Blood pressure (!) 180/95, pulse 94, temperature 97.9 F (36.6 C), height '5\' 2"'$  (1.575 m), weight 231 lb (104.8 kg), SpO2 95 %. Body mass index is 42.25 kg/m.  General: Cooperative, alert, well developed, in no acute distress. HEENT: Conjunctivae and lids unremarkable. Cardiovascular: Regular rhythm.  Lungs: Normal work of breathing. Neurologic: No focal deficits.   Lab Results  Component Value Date   CREATININE 0.75 11/26/2021   BUN 14 11/26/2021   NA 143 11/26/2021   K 4.0 11/26/2021   CL 103 11/26/2021   CO2 24 11/26/2021   Lab Results  Component Value Date   ALT 88 (H) 11/26/2021   AST 62 (H) 11/26/2021   ALKPHOS 79 11/26/2021   BILITOT 0.4 11/26/2021   Lab Results  Component Value Date   HGBA1C 6.4 (H) 11/26/2021   HGBA1C 6.7 (H) 02/09/2021   HGBA1C 6.2 (H) 04/08/2020   HGBA1C 7.0 (H) 08/22/2019   HGBA1C 6.7 (H) 04/19/2019   Lab Results  Component Value Date   INSULIN 37.9 (H)  11/26/2021   INSULIN 29.1 (H) 02/09/2021   INSULIN 43.2 (H) 04/08/2020   INSULIN 33.6 (H) 08/22/2019   INSULIN 40.0 (H) 04/19/2019   Lab Results  Component Value Date   TSH 2.550 12/08/2017   Lab Results  Component Value Date   CHOL 195 11/26/2021   HDL 44 11/26/2021   LDLCALC 111 (H) 11/26/2021   TRIG 228 (H) 11/26/2021   CHOLHDL 4.4 11/26/2021   Lab Results  Component Value Date   VD25OH 34.5 11/26/2021   VD25OH 42.3 02/09/2021   VD25OH 46.6  04/08/2020   Lab Results  Component Value Date   WBC 7.6 09/05/2019   HGB 15.0 09/05/2019   HCT 47.0 (H) 09/05/2019   MCV 85.0 09/05/2019   PLT 228 09/05/2019   No results found for: "IRON", "TIBC", "FERRITIN"  Attestation Statements:   Reviewed by clinician on day of visit: allergies, medications, problem list, medical history, surgical history, family history, social history, and previous encounter notes.   I, Trixie Dredge, am acting as transcriptionist for Dennard Nip, MD.  I have reviewed the above documentation for accuracy and completeness, and I agree with the above. -  Dennard Nip, MD

## 2022-03-10 ENCOUNTER — Other Ambulatory Visit (HOSPITAL_COMMUNITY): Payer: Self-pay

## 2022-03-10 MED ORDER — CLOBETASOL PROPIONATE 0.05 % EX OINT
TOPICAL_OINTMENT | CUTANEOUS | 0 refills | Status: DC
  Filled 2022-03-10: qty 15, 10d supply, fill #0

## 2022-03-18 ENCOUNTER — Other Ambulatory Visit (HOSPITAL_COMMUNITY): Payer: Self-pay

## 2022-03-18 DIAGNOSIS — G4733 Obstructive sleep apnea (adult) (pediatric): Secondary | ICD-10-CM | POA: Diagnosis not present

## 2022-03-18 DIAGNOSIS — G4730 Sleep apnea, unspecified: Secondary | ICD-10-CM | POA: Diagnosis not present

## 2022-03-20 ENCOUNTER — Other Ambulatory Visit (HOSPITAL_COMMUNITY): Payer: Self-pay

## 2022-03-22 ENCOUNTER — Other Ambulatory Visit (HOSPITAL_COMMUNITY): Payer: Self-pay

## 2022-03-22 MED ORDER — OZEMPIC (1 MG/DOSE) 4 MG/3ML ~~LOC~~ SOPN
1.0000 mg | PEN_INJECTOR | SUBCUTANEOUS | 3 refills | Status: DC
  Filled 2022-03-22: qty 3, 28d supply, fill #0
  Filled 2022-04-19: qty 3, 28d supply, fill #1
  Filled 2022-05-18: qty 3, 28d supply, fill #2
  Filled 2022-06-16: qty 3, 28d supply, fill #3
  Filled 2022-07-14 (×2): qty 3, 28d supply, fill #4
  Filled 2022-08-12: qty 3, 28d supply, fill #5
  Filled 2022-09-07: qty 3, 28d supply, fill #6
  Filled 2022-10-04: qty 3, 28d supply, fill #7
  Filled 2022-11-10: qty 3, 28d supply, fill #8

## 2022-03-24 ENCOUNTER — Encounter (INDEPENDENT_AMBULATORY_CARE_PROVIDER_SITE_OTHER): Payer: Self-pay | Admitting: Family Medicine

## 2022-03-24 ENCOUNTER — Ambulatory Visit (INDEPENDENT_AMBULATORY_CARE_PROVIDER_SITE_OTHER): Payer: Medicare HMO | Admitting: Family Medicine

## 2022-03-24 VITALS — BP 138/83 | HR 80 | Temp 97.7°F | Ht 62.0 in | Wt 229.0 lb

## 2022-03-24 DIAGNOSIS — Z7985 Long-term (current) use of injectable non-insulin antidiabetic drugs: Secondary | ICD-10-CM

## 2022-03-24 DIAGNOSIS — E1169 Type 2 diabetes mellitus with other specified complication: Secondary | ICD-10-CM | POA: Diagnosis not present

## 2022-03-24 DIAGNOSIS — R69 Illness, unspecified: Secondary | ICD-10-CM | POA: Diagnosis not present

## 2022-03-24 DIAGNOSIS — E669 Obesity, unspecified: Secondary | ICD-10-CM | POA: Diagnosis not present

## 2022-03-24 DIAGNOSIS — Z6841 Body Mass Index (BMI) 40.0 and over, adult: Secondary | ICD-10-CM

## 2022-03-24 DIAGNOSIS — F439 Reaction to severe stress, unspecified: Secondary | ICD-10-CM | POA: Diagnosis not present

## 2022-04-02 ENCOUNTER — Other Ambulatory Visit: Payer: Self-pay | Admitting: Family Medicine

## 2022-04-02 DIAGNOSIS — Z1231 Encounter for screening mammogram for malignant neoplasm of breast: Secondary | ICD-10-CM

## 2022-04-06 NOTE — Progress Notes (Signed)
Chief Complaint:   OBESITY Rhonda Aguilar is here to discuss her progress with her obesity treatment plan along with follow-up of her obesity related diagnoses. Rhonda Aguilar is on the Category 2 Plan with a protein drink in the morning and states she is following her eating plan approximately 50% of the time. Rhonda Aguilar states she is walking 2 times per week.    Today's visit was #: 18 Starting weight: 231 lbs Starting date: 12/08/2017 Today's weight: 229 lbs Today's date: 03/24/2022 Total lbs lost to date: 2 Total lbs lost since last in-office visit: 2  Interim History: Rhonda Aguilar notes struggling to follow a structured plan. She would like to change to a journaling plan. She has been eating out more recently. She is working on increasing  exercise and looking at other options.   Subjective:   1. Type 2 diabetes mellitus with other specified complication, without long-term current use of insulin ([name]) Rhonda Aguilar notes some constipation, diarrhea, and nausea on her Ozempic 1 mg. Her glucose is averaging between 120-140. She is working on following a Chief Financial Officer.   2. Stress Rhonda Aguilar is dealing with stress of moving and returning soon. She is trying to pay attention to stress and comfort eating.   Assessment/Plan:   1. Type 2 diabetes mellitus with other specified complication, without long-term current use of insulin ([name]) Rexene will continue Ozempic at 1 mg. She is ok to decrease to 0.75 mg when needed.   2. Stress Rhonda Aguilar is making strategies to help her be mindful.   3. BMI 40.0-44.9, adult ([place])  4. Obesity, Beginning BMI Rhonda Aguilar is currently in the action stage of change. As such, her goal is to continue with weight loss efforts. She has agreed to keeping a food journal and adhering to recommended goals of 1200-1300 calories and 90+ grams of protein daily.   Exercise goals: As is.   Behavioral modification strategies: increasing lean protein intake and emotional eating strategies.  Collins has  agreed to follow-up with our clinic in 2 weeks. She was informed of the importance of frequent follow-up visits to maximize her success with intensive lifestyle modifications for her multiple health conditions.   Objective:   Blood pressure 138/83, pulse 80, temperature 97.7 F (36.5 C), height 5' 2"$  (1.575 m), weight 229 lb (103.9 kg), SpO2 95 %. Body mass index is 41.88 kg/m.  General: Cooperative, alert, well developed, in no acute distress. HEENT: Conjunctivae and lids unremarkable. Cardiovascular: Regular rhythm.  Lungs: Normal work of breathing. Neurologic: No focal deficits.   Lab Results  Component Value Date   CREATININE 0.75 11/26/2021   BUN 14 11/26/2021   NA 143 11/26/2021   K 4.0 11/26/2021   CL 103 11/26/2021   CO2 24 11/26/2021   Lab Results  Component Value Date   ALT 88 (H) 11/26/2021   AST 62 (H) 11/26/2021   ALKPHOS 79 11/26/2021   BILITOT 0.4 11/26/2021   Lab Results  Component Value Date   HGBA1C 6.4 (H) 11/26/2021   HGBA1C 6.7 (H) 02/09/2021   HGBA1C 6.2 (H) 04/08/2020   HGBA1C 7.0 (H) 08/22/2019   HGBA1C 6.7 (H) 04/19/2019   Lab Results  Component Value Date   INSULIN 37.9 (H) 11/26/2021   INSULIN 29.1 (H) 02/09/2021   INSULIN 43.2 (H) 04/08/2020   INSULIN 33.6 (H) 08/22/2019   INSULIN 40.0 (H) 04/19/2019   Lab Results  Component Value Date   TSH 2.550 12/08/2017   Lab Results  Component Value Date  CHOL 195 11/26/2021   HDL 44 11/26/2021   LDLCALC 111 (H) 11/26/2021   TRIG 228 (H) 11/26/2021   CHOLHDL 4.4 11/26/2021   Lab Results  Component Value Date   VD25OH 34.5 11/26/2021   VD25OH 42.3 02/09/2021   VD25OH 46.6 04/08/2020   Lab Results  Component Value Date   WBC 7.6 09/05/2019   HGB 15.0 09/05/2019   HCT 47.0 (H) 09/05/2019   MCV 85.0 09/05/2019   PLT 228 09/05/2019   No results found for: "IRON", "TIBC", "FERRITIN"  Attestation Statements:   Reviewed by clinician on day of visit: allergies, medications, problem  list, medical history, surgical history, family history, social history, and previous encounter notes.  Time spent on visit including pre-visit chart review and post-visit care and charting was 35 minutes.   I, Trixie Dredge, am acting as transcriptionist for Dennard Nip, MD.  I have reviewed the above documentation for accuracy and completeness, and I agree with the above. -  Dennard Nip, MD

## 2022-04-18 DIAGNOSIS — G4730 Sleep apnea, unspecified: Secondary | ICD-10-CM | POA: Diagnosis not present

## 2022-04-18 DIAGNOSIS — G4733 Obstructive sleep apnea (adult) (pediatric): Secondary | ICD-10-CM | POA: Diagnosis not present

## 2022-04-19 ENCOUNTER — Other Ambulatory Visit (HOSPITAL_COMMUNITY): Payer: Self-pay

## 2022-04-21 ENCOUNTER — Encounter (INDEPENDENT_AMBULATORY_CARE_PROVIDER_SITE_OTHER): Payer: Self-pay | Admitting: Family Medicine

## 2022-04-21 ENCOUNTER — Ambulatory Visit (INDEPENDENT_AMBULATORY_CARE_PROVIDER_SITE_OTHER): Payer: Medicare HMO | Admitting: Family Medicine

## 2022-04-21 ENCOUNTER — Other Ambulatory Visit (HOSPITAL_COMMUNITY): Payer: Self-pay

## 2022-04-21 VITALS — BP 126/85 | HR 85 | Temp 97.9°F | Ht 62.0 in | Wt 228.0 lb

## 2022-04-21 DIAGNOSIS — E559 Vitamin D deficiency, unspecified: Secondary | ICD-10-CM

## 2022-04-21 DIAGNOSIS — Z6841 Body Mass Index (BMI) 40.0 and over, adult: Secondary | ICD-10-CM | POA: Insufficient documentation

## 2022-04-21 DIAGNOSIS — E669 Obesity, unspecified: Secondary | ICD-10-CM | POA: Diagnosis not present

## 2022-04-21 DIAGNOSIS — F439 Reaction to severe stress, unspecified: Secondary | ICD-10-CM | POA: Diagnosis not present

## 2022-04-21 DIAGNOSIS — E1169 Type 2 diabetes mellitus with other specified complication: Secondary | ICD-10-CM | POA: Diagnosis not present

## 2022-04-21 DIAGNOSIS — Z7985 Long-term (current) use of injectable non-insulin antidiabetic drugs: Secondary | ICD-10-CM

## 2022-04-21 DIAGNOSIS — R69 Illness, unspecified: Secondary | ICD-10-CM | POA: Diagnosis not present

## 2022-04-21 MED ORDER — VITAMIN D (ERGOCALCIFEROL) 1.25 MG (50000 UNIT) PO CAPS
50000.0000 [IU] | ORAL_CAPSULE | ORAL | 0 refills | Status: DC
  Filled 2022-04-21 – 2022-04-22 (×2): qty 12, 84d supply, fill #0

## 2022-04-22 ENCOUNTER — Other Ambulatory Visit: Payer: Self-pay

## 2022-05-10 NOTE — Progress Notes (Signed)
Chief Complaint:   OBESITY Rhonda Aguilar is here to discuss her progress with her obesity treatment plan along with follow-up of her obesity related diagnoses. Rhonda Aguilar is on keeping a food journal and adhering to recommended goals of 1200-1300 calories and 90+ grams of protein and states she is following her eating plan approximately 50% of the time. Rhonda Aguilar states she is doing 0 minutes 0 times per week.  Today's visit was #: 67 Starting weight: 231 lbs Starting date: 12/08/2017 Today's weight: 228 lbs Today's date: 04/21/2022 Total lbs lost to date: 3 Total lbs lost since last in-office visit: 1  Interim History: Rhonda Aguilar continues to work on her weight loss.  She is not doing formal exercise and she is trying to portion control and make smarter choices.  She is getting ready to go to water aerobics.  Subjective:   1. Stress [name] is dealing with extra stressors now.  She is staying at a hotel and she is eating out more.  She is trying to be mindful of her food choices and avoid emotional eating behavior.  2. Type 2 diabetes mellitus with other specified complication, without long-term current use of insulin ([name]) Phi is on Ozempic and her last A1c was controlled at 6.4.  She is having nausea at times which tends to improve with small amounts of food.  3. Vitamin D deficiency Rhonda Aguilar is on vitamin D, and her last vitamin D level was below goal.  Assessment/Plan:   1. Stress Rhonda Aguilar was offered support and encouragement, and we will follow-up at her next visit.  2. Type 2 diabetes mellitus with other specified complication, without long-term current use of insulin ([name]) Rhonda Aguilar was encouraged to eat small amounts of food more often to help decrease nausea.  3. Vitamin D deficiency Rhonda Aguilar will continue prescription vitamin D, and we will refill for 90 days.  - Vitamin D, Ergocalciferol, (DRISDOL) 1.25 MG (50000 UNIT) CAPS capsule; Take 1 capsule (50,000 Units total) by mouth once a week.   Dispense: 12 capsule; Refill: 0  4. BMI 40.0-44.9, adult ([name])  5. Obesity, Beginning BMI 42.25 Ariani is currently in the action stage of change. As such, her goal is to continue with weight loss efforts. She has agreed to keeping a food journal and adhering to recommended goals of 1200-1300 calories and 90+ grams of protein daily.   Behavioral modification strategies: increasing lean protein intake.  Rhonda Aguilar has agreed to follow-up with our clinic in 4 weeks. She was informed of the importance of frequent follow-up visits to maximize her success with intensive lifestyle modifications for her multiple health conditions.   Objective:   Blood pressure 126/85, pulse 85, temperature 97.9 F (36.6 C), height 5\' 2"  (1.575 m), weight 228 lb (103.4 kg), SpO2 95 %. Body mass index is 41.7 kg/m.  Lab Results  Component Value Date   CREATININE 0.75 11/26/2021   BUN 14 11/26/2021   NA 143 11/26/2021   K 4.0 11/26/2021   CL 103 11/26/2021   CO2 24 11/26/2021   Lab Results  Component Value Date   ALT 88 (H) 11/26/2021   AST 62 (H) 11/26/2021   ALKPHOS 79 11/26/2021   BILITOT 0.4 11/26/2021   Lab Results  Component Value Date   HGBA1C 6.4 (H) 11/26/2021   HGBA1C 6.7 (H) 02/09/2021   HGBA1C 6.2 (H) 04/08/2020   HGBA1C 7.0 (H) 08/22/2019   HGBA1C 6.7 (H) 04/19/2019   Lab Results  Component Value Date   INSULIN 37.9 (  H) 11/26/2021   INSULIN 29.1 (H) 02/09/2021   INSULIN 43.2 (H) 04/08/2020   INSULIN 33.6 (H) 08/22/2019   INSULIN 40.0 (H) 04/19/2019   Lab Results  Component Value Date   TSH 2.550 12/08/2017   Lab Results  Component Value Date   CHOL 195 11/26/2021   HDL 44 11/26/2021   LDLCALC 111 (H) 11/26/2021   TRIG 228 (H) 11/26/2021   CHOLHDL 4.4 11/26/2021   Lab Results  Component Value Date   VD25OH 34.5 11/26/2021   VD25OH 42.3 02/09/2021   VD25OH 46.6 04/08/2020   Lab Results  Component Value Date   WBC 7.6 09/05/2019   HGB 15.0 09/05/2019   HCT 47.0 (H)  09/05/2019   MCV 85.0 09/05/2019   PLT 228 09/05/2019   No results found for: "IRON", "TIBC", "FERRITIN"  Attestation Statements:   Reviewed by clinician on day of visit: allergies, medications, problem list, medical history, surgical history, family history, social history, and previous encounter notes.   I, Burt Knack, am acting as transcriptionist for Quillian Quince, MD.  I have reviewed the above documentation for accuracy and completeness, and I agree with the above. -  Quillian Quince, MD

## 2022-05-13 DIAGNOSIS — Z6841 Body Mass Index (BMI) 40.0 and over, adult: Secondary | ICD-10-CM | POA: Diagnosis not present

## 2022-05-13 DIAGNOSIS — E1165 Type 2 diabetes mellitus with hyperglycemia: Secondary | ICD-10-CM | POA: Diagnosis not present

## 2022-05-13 DIAGNOSIS — R590 Localized enlarged lymph nodes: Secondary | ICD-10-CM | POA: Diagnosis not present

## 2022-05-17 DIAGNOSIS — G4733 Obstructive sleep apnea (adult) (pediatric): Secondary | ICD-10-CM | POA: Diagnosis not present

## 2022-05-17 DIAGNOSIS — G4730 Sleep apnea, unspecified: Secondary | ICD-10-CM | POA: Diagnosis not present

## 2022-05-18 ENCOUNTER — Other Ambulatory Visit (HOSPITAL_COMMUNITY): Payer: Self-pay

## 2022-05-18 ENCOUNTER — Ambulatory Visit
Admission: RE | Admit: 2022-05-18 | Discharge: 2022-05-18 | Disposition: A | Discharge status: Home / Self Care | Payer: Medicare HMO | Source: Ambulatory Visit | Attending: Family Medicine | Admitting: Family Medicine

## 2022-05-18 ENCOUNTER — Other Ambulatory Visit: Payer: Self-pay

## 2022-05-18 DIAGNOSIS — Z1231 Encounter for screening mammogram for malignant neoplasm of breast: Secondary | ICD-10-CM

## 2022-05-18 MED ORDER — ROSUVASTATIN CALCIUM 5 MG PO TABS
5.0000 mg | ORAL_TABLET | ORAL | 0 refills | Status: DC
  Filled 2022-05-18: qty 12, 84d supply, fill #0
  Filled 2023-02-08: qty 1, 7d supply, fill #1

## 2022-05-19 ENCOUNTER — Encounter (INDEPENDENT_AMBULATORY_CARE_PROVIDER_SITE_OTHER): Payer: Self-pay | Admitting: Family Medicine

## 2022-05-19 ENCOUNTER — Other Ambulatory Visit (HOSPITAL_COMMUNITY): Payer: Self-pay

## 2022-05-19 ENCOUNTER — Ambulatory Visit (INDEPENDENT_AMBULATORY_CARE_PROVIDER_SITE_OTHER): Payer: Medicare HMO | Admitting: Family Medicine

## 2022-05-19 VITALS — BP 145/77 | HR 87 | Temp 98.0°F | Ht 62.0 in | Wt 231.0 lb

## 2022-05-19 DIAGNOSIS — Z7985 Long-term (current) use of injectable non-insulin antidiabetic drugs: Secondary | ICD-10-CM | POA: Diagnosis not present

## 2022-05-19 DIAGNOSIS — I10 Essential (primary) hypertension: Secondary | ICD-10-CM

## 2022-05-19 DIAGNOSIS — E1169 Type 2 diabetes mellitus with other specified complication: Secondary | ICD-10-CM

## 2022-05-19 DIAGNOSIS — E669 Obesity, unspecified: Secondary | ICD-10-CM | POA: Diagnosis not present

## 2022-05-19 DIAGNOSIS — Z6841 Body Mass Index (BMI) 40.0 and over, adult: Secondary | ICD-10-CM | POA: Diagnosis not present

## 2022-05-24 NOTE — Progress Notes (Unsigned)
Chief Complaint:   OBESITY Rhonda Aguilar is here to discuss her progress with her obesity treatment plan along with follow-up of her obesity related diagnoses. Rhonda Aguilar is on keeping a food journal and adhering to recommended goals of 1200-1300 calories and 90+ grams of protein and states she is following her eating plan approximately 30% of the time. Rhonda Aguilar states she is doing 0 minutes 0 times per week.  Today's visit was #: 62 Starting weight: 231 lbs Starting date: 12/08/2017 Today's weight: 231 lbs Today's date: 05/19/2022 Total lbs lost to date: 0 Total lbs lost since last in-office visit: 0  Interim History: Rhonda Aguilar was on vacation and she did some celebration eating.  She has been sick as well and had increased carbohydrates to feel better.  She is working on getting back on track.  Subjective:   1. Essential hypertension Rhonda Aguilar is working on decreasing sodium in her diet.  She is on Diovan and HCTZ as needed.  2. Type 2 diabetes mellitus with other specified complication, without long-term current use of insulin ([name]) Rhonda Aguilar is on Ozempic, and she is working on her diet.  She struggles to decrease simple carbohydrates.  Assessment/Plan:   1. Essential hypertension Rhonda Aguilar will try to increase her water intake and decrease her sodium intake, and she is to take her HCTZ for the next couple of days.  2. Type 2 diabetes mellitus with other specified complication, without long-term current use of insulin ([name]) Rhonda Aguilar is to continue Ozempic and continue working on her diet.  We will continue to monitor and follow closely.  3. BMI 40.0-44.9, adult ([place])  4. Obesity, Beginning BMI 42.25 Rhonda Aguilar is currently in the action stage of change. As such, her goal is to continue with weight loss efforts. She has agreed to keeping a food journal and adhering to recommended goals of 1200-1300 calories and 90+ grams of protein daily.   Behavioral modification strategies: travel eating strategies and  celebration eating strategies.  Rhonda Aguilar has agreed to follow-up with our clinic in 4 weeks. She was informed of the importance of frequent follow-up visits to maximize her success with intensive lifestyle modifications for her multiple health conditions.   Objective:   Blood pressure (!) 145/77, pulse 87, temperature 98 F (36.7 C), height 5\' 2"  (1.575 m), weight 231 lb (104.8 kg), SpO2 94 %. Body mass index is 42.25 kg/m.  Lab Results  Component Value Date   CREATININE 0.75 11/26/2021   BUN 14 11/26/2021   NA 143 11/26/2021   K 4.0 11/26/2021   CL 103 11/26/2021   CO2 24 11/26/2021   Lab Results  Component Value Date   ALT 88 (H) 11/26/2021   AST 62 (H) 11/26/2021   ALKPHOS 79 11/26/2021   BILITOT 0.4 11/26/2021   Lab Results  Component Value Date   HGBA1C 6.4 (H) 11/26/2021   HGBA1C 6.7 (H) 02/09/2021   HGBA1C 6.2 (H) 04/08/2020   HGBA1C 7.0 (H) 08/22/2019   HGBA1C 6.7 (H) 04/19/2019   Lab Results  Component Value Date   INSULIN 37.9 (H) 11/26/2021   INSULIN 29.1 (H) 02/09/2021   INSULIN 43.2 (H) 04/08/2020   INSULIN 33.6 (H) 08/22/2019   INSULIN 40.0 (H) 04/19/2019   Lab Results  Component Value Date   TSH 2.550 12/08/2017   Lab Results  Component Value Date   CHOL 195 11/26/2021   HDL 44 11/26/2021   LDLCALC 111 (H) 11/26/2021   TRIG 228 (H) 11/26/2021   CHOLHDL 4.4 11/26/2021  Lab Results  Component Value Date   VD25OH 34.5 11/26/2021   VD25OH 42.3 02/09/2021   VD25OH 46.6 04/08/2020   Lab Results  Component Value Date   WBC 7.6 09/05/2019   HGB 15.0 09/05/2019   HCT 47.0 (H) 09/05/2019   MCV 85.0 09/05/2019   PLT 228 09/05/2019   No results found for: "IRON", "TIBC", "FERRITIN"  Attestation Statements:   Reviewed by clinician on day of visit: allergies, medications, problem list, medical history, surgical history, family history, social history, and previous encounter notes.  Time spent on visit including pre-visit chart review and  post-visit care and charting was 40 minutes.   I, Trixie Dredge, am acting as transcriptionist for Dennard Nip, MD.  I have reviewed the above documentation for accuracy and completeness, and I agree with the above. -  Dennard Nip, MD

## 2022-05-26 ENCOUNTER — Other Ambulatory Visit (HOSPITAL_COMMUNITY): Payer: Self-pay

## 2022-06-01 ENCOUNTER — Encounter: Payer: Self-pay | Admitting: Infectious Disease

## 2022-06-01 ENCOUNTER — Other Ambulatory Visit: Payer: Self-pay

## 2022-06-01 ENCOUNTER — Ambulatory Visit: Payer: Medicare HMO | Admitting: Infectious Disease

## 2022-06-01 VITALS — BP 148/78 | HR 97 | Temp 97.1°F | Resp 96 | Ht 62.0 in | Wt 235.0 lb

## 2022-06-01 DIAGNOSIS — I152 Hypertension secondary to endocrine disorders: Secondary | ICD-10-CM | POA: Diagnosis not present

## 2022-06-01 DIAGNOSIS — E1159 Type 2 diabetes mellitus with other circulatory complications: Secondary | ICD-10-CM

## 2022-06-01 DIAGNOSIS — R590 Localized enlarged lymph nodes: Secondary | ICD-10-CM | POA: Insufficient documentation

## 2022-06-01 DIAGNOSIS — Z7985 Long-term (current) use of injectable non-insulin antidiabetic drugs: Secondary | ICD-10-CM

## 2022-06-01 DIAGNOSIS — K760 Fatty (change of) liver, not elsewhere classified: Secondary | ICD-10-CM | POA: Diagnosis not present

## 2022-06-01 HISTORY — DX: Localized enlarged lymph nodes: R59.0

## 2022-06-01 NOTE — Progress Notes (Signed)
Reason for infectious disease consult recurrent cervical lymphadenopathy on the right side  Requesting physician: Garth Bigness, MD  Subjective:    Patient ID: Rhonda Aguilar, female    DOB: [birth year], 67 y.o.   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  Much of the cervical lymphadenopathy seems to emerge in the context of her having what was at one time thought to be Soraya attic lesions that she is scratched at but now sounds to be more largely driven by anxiety and neurogenic in nature if I understand her correctly.  She did recently also have an upper respiratory viral infection but also because lymphadenopathy.  This seems resolved at present.  She was referred to our clinic by her primary care physician to workup her waxing and waning lymphadenopathy.  Labs done with her primary care revealed negative Bartonella serologies) she has many cats at home.  CBC and CMP were unremarkable.     Past Medical History:  Diagnosis Date   Anxiety    Arthritis    Benign essential HTN 11/22/2014   Cervical lymphadenopathy 06/01/2022   Diabetes mellitus, type II    Dyspnea    On exertion   Fatty liver    GERD (gastroesophageal reflux disease)    Hypertension    IBS (irritable bowel syndrome)    Migraine    Obesity (BMI 30-39.9) 11/22/2014   OSA (obstructive sleep apnea)    Pneumonia     Past Surgical History:  Procedure Laterality Date   BREAST BIOPSY Right 2014   CHOLECYSTECTOMY N/A 09/14/2019   Procedure: LAPAROSCOPIC CHOLECYSTECTOMY;  Surgeon: Berna Bue, MD;  Location: [facility];  Service: General;  Laterality: N/A;   COLONOSCOPY     TONSILLECTOMY      Family History  Problem Relation Age of Onset   Diabetes Mother    Heart disease Mother    Hypertension Mother    High Cholesterol Mother    Kidney disease Mother    Depression Mother    Obesity Mother     Coronary artery disease Father    High blood pressure Father    High Cholesterol Father    Depression Father    Hypertension Brother       Social History   Socioeconomic History   Marital status: Divorced    Spouse name: Not on file   Number of children: 0   Years of education: college   Highest education level: Not on file  Occupational History   Occupation: Freight forwarder cardiology  Tobacco Use   Smoking status: Never   Smokeless tobacco: Never  Vaping Use   Vaping Use: Never used  Substance and Sexual Activity   Alcohol use: Yes    Alcohol/week: 0.0 - 1.0 standard drinks of alcohol    Comment: occas.   Drug use: Never   Sexual activity: Not on file  Other Topics Concern   Not on file  Social History Narrative   Not on file   Social Determinants of Health   Financial Resource Strain: Not on file  Food Insecurity: No Food Insecurity (11/11/2021)   Hunger Vital Sign    Worried About Running Out of Food in the Last Year: Never true    Ran Out of Food in the Last Year: Never true  Transportation Needs: No Transportation Needs (11/11/2021)   PRAPARE - Transportation    Lack of  Transportation (Medical): No    Lack of Transportation (Non-Medical): No  Physical Activity: Not on file  Stress: Not on file  Social Connections: Not on file    Allergies  Allergen Reactions   Glyburide Other (See Comments)    Migraines    Metformin And Related Other (See Comments)    migraines   Saxenda [Liraglutide -Weight Management] Nausea Only   Codeine Itching and Rash   Penicillins Rash   Sulfa Antibiotics Rash     Current Outpatient Medications:    busPIRone (BUSPAR) 5 MG tablet, Take 1 tablet (5 mg total) by mouth 2 (two) times daily. (Patient taking differently: Take 5 mg by mouth daily.), Disp: 180 tablet, Rfl: 3   cetirizine (ZYRTEC) 10 MG tablet, Take 10 mg by mouth at bedtime. , Disp: , Rfl:    clobetasol ointment (TEMOVATE) 0.05 %, Apply to affected area twice a  day as needed, Disp: 15 g, Rfl: 0   dexlansoprazole (DEXILANT) 60 MG capsule, TAKE 1 CAPSULE BY MOUTH ONCE DAILY  20 MINUTES BEFORE BREAKFAST, Disp: 90 capsule, Rfl: 4   dexlansoprazole (DEXILANT) 60 MG capsule, Take 1 capsule (60 mg total) by mouth daily 20 minutes before breakfast, Disp: 90 capsule, Rfl: 4   hydrochlorothiazide (HYDRODIURIL) 25 MG tablet, Take 12.5 mg by mouth as needed. , Disp: , Rfl:    ibuprofen (ADVIL) 200 MG tablet, Take 600 mg by mouth every 8 (eight) hours as needed for mild pain or moderate pain. , Disp: , Rfl:    meclizine (ANTIVERT) 25 MG tablet, Take 1 tablet by mouth  as needed three times a day for 7 days., Disp: 21 tablet, Rfl: 0   polyethylene glycol powder (GLYCOLAX/MIRALAX) 17 GM/SCOOP powder, Take 17 g by mouth daily as needed., Disp: 3350 g, Rfl: 0   rosuvastatin (CRESTOR) 5 MG tablet, Take 1 tablet (5 mg total) by mouth once a week., Disp: 13 tablet, Rfl: 0   Semaglutide, 1 MG/DOSE, (OZEMPIC, 1 MG/DOSE,) 4 MG/3ML SOPN, Inject 1 mg into the skin once a week., Disp: 12 mL, Rfl: 3   valsartan (DIOVAN) 320 MG tablet, TAKE 1 TABLET BY MOUTH ONCE A DAY, Disp: 90 tablet, Rfl: 3   valsartan (DIOVAN) 320 MG tablet, Take 1 tablet (320 mg total) by mouth daily., Disp: 90 tablet, Rfl: 3   Verapamil HCl CR 300 MG CP24, Take 1 capsule (300 mg total) by mouth daily., Disp: 90 capsule, Rfl: 3   Vitamin D, Ergocalciferol, (DRISDOL) 1.25 MG (50000 UNIT) CAPS capsule, Take 1 capsule (50,000 Units total) by mouth once a week., Disp: 12 capsule, Rfl: 0   COVID-19 mRNA vaccine 2023-2024 (COMIRNATY) syringe, Inject into the muscle. (Patient not taking: Reported on 06/01/2022), Disp: 0.3 mL, Rfl: 0   Multiple Vitamin (MULTIVITAMIN) tablet, Take 1 tablet by mouth daily. Ultra gastric tabs / supplements (Patient not taking: Reported on 06/01/2022), Disp: , Rfl:  No current facility-administered medications for this visit.  Facility-Administered Medications Ordered in Other Visits:     sodium chloride flush (NS) 0.9 % injection 10 mL, 10 mL, Intravenous, PRN, Mayford Knifeurner, Traci R, MD, 20 mL at 11/15/19 [id number]   Review of Systems  Constitutional:  Negative for activity change, appetite change, chills, diaphoresis, fatigue, fever and unexpected weight change.  HENT:  Negative for congestion, rhinorrhea, sinus pressure, sneezing, sore throat and trouble swallowing.   Eyes:  Negative for photophobia and visual disturbance.  Respiratory:  Negative for cough, chest tightness, shortness of breath, wheezing and stridor.  Cardiovascular:  Negative for chest pain, palpitations and leg swelling.  Gastrointestinal:  Negative for abdominal distention, abdominal pain, anal bleeding, blood in stool, constipation, diarrhea, nausea and vomiting.  Genitourinary:  Negative for difficulty urinating, dysuria, flank pain and hematuria.  Musculoskeletal:  Negative for arthralgias, back pain, gait problem, joint swelling and myalgias.  Skin:  Negative for color change, pallor, rash and wound.  Neurological:  Negative for dizziness, tremors, weakness and light-headedness.  Hematological:  Negative for adenopathy. Does not bruise/bleed easily.  Psychiatric/Behavioral:  Negative for agitation, behavioral problems, confusion, decreased concentration, dysphoric mood and sleep disturbance.        Objective:   Physical Exam Constitutional:      General: She is not in acute distress.    Appearance: Normal appearance. She is well-developed. She is not ill-appearing or diaphoretic.  HENT:     Head: Normocephalic and atraumatic.     Right Ear: Hearing and external ear normal.     Left Ear: Hearing and external ear normal.     Nose: No nasal deformity or rhinorrhea.  Eyes:     General: No scleral icterus.    Conjunctiva/sclera: Conjunctivae normal.     Right eye: Right conjunctiva is not injected.     Left eye: Left conjunctiva is not injected.     Pupils: Pupils are equal, round, and reactive to light.   Neck:     Vascular: No JVD.  Cardiovascular:     Rate and Rhythm: Normal rate and regular rhythm.     Heart sounds: S1 normal and S2 normal.  Pulmonary:     Effort: Pulmonary effort is normal. No respiratory distress.     Breath sounds: No wheezing.  Abdominal:     General: There is no distension.     Palpations: Abdomen is soft.  Musculoskeletal:        General: Normal range of motion.     Right shoulder: Normal.     Left shoulder: Normal.     Cervical back: Normal range of motion and neck supple.     Right hip: Normal.     Left hip: Normal.     Right knee: Normal.     Left knee: Normal.  Lymphadenopathy:     Head:     Right side of head: No submandibular, preauricular or posterior auricular adenopathy.     Left side of head: No submandibular, preauricular or posterior auricular adenopathy.     Cervical: No cervical adenopathy.     Right cervical: No superficial or deep cervical adenopathy.    Left cervical: No superficial or deep cervical adenopathy.     Upper Body:     Right upper body: No supraclavicular adenopathy.     Left upper body: No supraclavicular adenopathy.  Skin:    General: Skin is warm and dry.     Coloration: Skin is not pale.     Findings: No abrasion, bruising, ecchymosis, erythema, lesion or rash.     Nails: There is no clubbing.  Neurological:     General: No focal deficit present.     Mental Status: She is alert and oriented to person, place, and time.     Sensory: No sensory deficit.     Coordination: Coordination normal.     Gait: Gait normal.  Psychiatric:        Attention and Perception: She is attentive.        Mood and Affect: Mood normal.        Speech:  Speech normal.        Behavior: Behavior normal. Behavior is cooperative.        Thought Content: Thought content normal.        Judgment: Judgment normal.           Assessment & Plan:   Waxing and waning lymphadenopathy:  The waxing and waning of her cervical lymphadenopathy  would argue against a pathological process such as malignancy or untreated infection.  For thoroughness we will recheck an HIV antibody) she has had a history of false positive Theora Gianotti has in the past so we will need to interpret this cautiously and presume that her test is likely to be negative when it gets to the qualitative RNA part of the fourth-generation assay.  Will recheck EBV serologies that she has known prior mono and I doubt that has much to do with her symptoms.  We also check CMV serologies, sed rate CRP.  Screening for viral hepatitides she is screened negative for hepatitis C I will check a hepatitis A antibody.  She will return to clinic as needed.  I have personally spent 62 minutes involved in face-to-face and non-face-to-face activities for this patient on the day of the visit. Professional time spent includes the following activities: Preparing to see the patient (review of tests), Obtaining and/or reviewing separately obtained history (admission/discharge record), Performing a medically appropriate examination and/or evaluation , Ordering medications/tests/procedures, referring and communicating with other health care professionals, Documenting clinical information in the EMR, Independently interpreting results (not separately reported), Communicating results to the patient/family/caregiver, Counseling and educating the patient/family/caregiver and Care coordination (not separately reported). \]

## 2022-06-02 LAB — ANGIOTENSIN CONVERTING ENZYME: Angiotensin-Converting Enzyme: 28 U/L (ref 9–67)

## 2022-06-02 LAB — HEPATITIS A ANTIBODY, TOTAL: Hepatitis A AB,Total: NONREACTIVE

## 2022-06-03 LAB — CMV ABS, IGG+IGM (CYTOMEGALOVIRUS)
CMV IgM: <30 AU/mL
Cytomegalovirus Ab-IgG: 4.5 U/mL — ABNORMAL HIGH

## 2022-06-03 LAB — HIV ANTIBODY (ROUTINE TESTING W REFLEX): HIV 1&2 Ab, 4th Generation: NONREACTIVE

## 2022-06-03 LAB — EPSTEIN-BARR VIRUS VCA ANTIBODY PANEL
EBV NA IgG: 87 U/mL — ABNORMAL HIGH
EBV VCA IgG: 118 U/mL — ABNORMAL HIGH
EBV VCA IgM: <36 U/mL

## 2022-06-03 LAB — C-REACTIVE PROTEIN: CRP: 7.3 mg/L (ref <8.0)

## 2022-06-08 ENCOUNTER — Encounter: Payer: Self-pay | Admitting: Infectious Disease

## 2022-06-16 ENCOUNTER — Encounter (INDEPENDENT_AMBULATORY_CARE_PROVIDER_SITE_OTHER): Payer: Self-pay | Admitting: Family Medicine

## 2022-06-16 ENCOUNTER — Telehealth (INDEPENDENT_AMBULATORY_CARE_PROVIDER_SITE_OTHER): Payer: Medicare HMO | Admitting: Family Medicine

## 2022-06-16 ENCOUNTER — Ambulatory Visit (INDEPENDENT_AMBULATORY_CARE_PROVIDER_SITE_OTHER): Payer: Medicare HMO | Admitting: Family Medicine

## 2022-06-16 DIAGNOSIS — E1169 Type 2 diabetes mellitus with other specified complication: Secondary | ICD-10-CM

## 2022-06-16 DIAGNOSIS — Z7985 Long-term (current) use of injectable non-insulin antidiabetic drugs: Secondary | ICD-10-CM | POA: Diagnosis not present

## 2022-06-16 DIAGNOSIS — Z6841 Body Mass Index (BMI) 40.0 and over, adult: Secondary | ICD-10-CM

## 2022-06-16 DIAGNOSIS — R590 Localized enlarged lymph nodes: Secondary | ICD-10-CM | POA: Diagnosis not present

## 2022-06-16 NOTE — Progress Notes (Signed)
TeleHealth Visit:  This visit was completed with telemedicine (audio/video) technology. Rhonda Aguilar has verbally consented to this TeleHealth visit. The patient is located at home, the provider is located at home. The participants in this visit include the listed provider and patient. The visit was conducted today via MyChart video.  OBESITY Rhonda Aguilar is here to discuss her progress with her obesity treatment plan along with follow-up of her obesity related diagnoses.   Today's visit was # 69 Starting weight: 231 lbs Starting date: 12/08/2017 Weight at last in office visit: 231 lbs on 05/19/22 Total weight loss: 0 lbs at last in office visit on 05/19/22. Today's reported weight (06/16/22): none reported  Nutrition Plan: keeping a food journal with goal of 1200-1300 calories and 90 grams of protein daily - 0% adherence.  Current exercise:  none  Interim History:  She has not journaled recently due to stress from some home repairs from a leak.  She has also not felt well and had issues with low-grade fever, fatigue, and cervical adenopathy. She has been doing pretty well with making good food choices. She has been eating more chips recently. She has not been journaling but plans on starting.  She has an exercise bike at home and has been thinking about riding it.  She would also like to begin doing water exercise.  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  She is planning to retire in August. Assessment/Plan:  1. Type 2 Diabetes Mellitus with other specified complication, without long-term current use of insulin HgbA1c is at goal. Last A1c was 6.4 on 11/26/21. Will have labs done at PCP in May. Medication(s): Ozempic 1 mg SQ weekly Ozempic is not helping much with appetite control.  She knows she needs a higher dose but has not tolerated a higher dose well in the past.  Lab Results  Component Value Date   HGBA1C 6.4 (H) 11/26/2021   HGBA1C 6.7 (H)  02/09/2021   HGBA1C 6.2 (H) 04/08/2020   Lab Results  Component Value Date   LDLCALC 111 (H) 11/26/2021   CREATININE 0.75 11/26/2021   No results found for: "GFR"  Plan: I suggested that she see if she can get coverage for Mounjaro. Until then she will continue Ozempic.  If unable to get [facility] she may try to increase her dose of the Ozempic. Instructions sent via MyChart for titrating the 2 mg pen to lower dosages.   2.  Cervical lymphadenopathy Has had low-grade fever, cervical lymphadenopathy, fatigue recently.  Referred to infectious disease by PCP.  Testing done for Epstein-Barr, HIV, cytomegalovirus.  She had some abnormal values but has not heard back about the interpretation. However she is starting to feel better and has more energy.  Plan: Follow-up with infectious disease as recommended.  3. Morbid Obesity: Current BMI 42  Rhonda Aguilar is currently in the action stage of change. As such, her goal is to continue with weight loss efforts.  She has agreed to keeping a food journal with goal of 1200-1300 calories and 90 grams of protein daily.  1.  Begin journaling consistently. 2.  Start riding exercise bike 3 days/week for 10 to 15 minutes.  Behavioral modification strategies: increasing lean protein intake, decreasing simple carbohydrates , meal planning , planning for success, keep a strict food journal, and increase frequency of journaling.  Rhonda Aguilar has agreed to follow-up with our clinic in 4 weeks.   No orders of the defined types were placed in this encounter.  There are no discontinued medications.   No orders of the defined types were placed in this encounter.     Objective:   VITALS: Per patient if applicable, see vitals. GENERAL: Alert and in no acute distress. CARDIOPULMONARY: No increased WOB. Speaking in clear sentences.  PSYCH: Pleasant and cooperative. Speech normal rate and rhythm. Affect is appropriate. Insight and judgement are appropriate.  Attention is focused, linear, and appropriate.  NEURO: Oriented as arrived to appointment on time with no prompting.   Attestation Statements:   Reviewed by clinician on day of visit: allergies, medications, problem list, medical history, surgical history, family history, social history, and previous encounter notes.   Time spent on visit including the items listed below was 30 minutes.  -preparing to see the patient (e.g., review of tests, history, previous notes) -obtaining and/or reviewing separately obtained history -counseling and educating the patient/family/caregiver -documenting clinical information in the electronic or other health record -ordering medications, tests, or procedures -independently interpreting results and communicating results to the patient/ family/caregiver -referring and communicating with other health care professionals  -care coordination   This was prepared with the assistance of Engineer, civil (consulting).  Occasional wrong-word or sound-a-like substitutions may have occurred due to the inherent limitations of voice recognition software.

## 2022-06-17 ENCOUNTER — Encounter (HOSPITAL_COMMUNITY): Payer: Self-pay

## 2022-06-17 ENCOUNTER — Other Ambulatory Visit (HOSPITAL_COMMUNITY): Payer: Self-pay

## 2022-06-17 DIAGNOSIS — G4733 Obstructive sleep apnea (adult) (pediatric): Secondary | ICD-10-CM | POA: Diagnosis not present

## 2022-06-17 DIAGNOSIS — G4730 Sleep apnea, unspecified: Secondary | ICD-10-CM | POA: Diagnosis not present

## 2022-06-20 ENCOUNTER — Encounter: Payer: Self-pay | Admitting: Infectious Disease

## 2022-06-23 DIAGNOSIS — E1136 Type 2 diabetes mellitus with diabetic cataract: Secondary | ICD-10-CM | POA: Diagnosis not present

## 2022-06-23 DIAGNOSIS — Z6841 Body Mass Index (BMI) 40.0 and over, adult: Secondary | ICD-10-CM | POA: Diagnosis not present

## 2022-06-23 DIAGNOSIS — I10 Essential (primary) hypertension: Secondary | ICD-10-CM | POA: Diagnosis not present

## 2022-06-23 DIAGNOSIS — E782 Mixed hyperlipidemia: Secondary | ICD-10-CM | POA: Diagnosis not present

## 2022-07-14 ENCOUNTER — Other Ambulatory Visit (HOSPITAL_COMMUNITY): Payer: Self-pay

## 2022-07-14 ENCOUNTER — Other Ambulatory Visit: Payer: Self-pay

## 2022-07-14 ENCOUNTER — Encounter (INDEPENDENT_AMBULATORY_CARE_PROVIDER_SITE_OTHER): Payer: Self-pay | Admitting: Family Medicine

## 2022-07-14 ENCOUNTER — Ambulatory Visit (INDEPENDENT_AMBULATORY_CARE_PROVIDER_SITE_OTHER): Payer: Medicare HMO | Admitting: Family Medicine

## 2022-07-14 VITALS — BP 139/78 | HR 80 | Temp 98.3°F | Ht 62.0 in | Wt 230.0 lb

## 2022-07-14 DIAGNOSIS — E669 Obesity, unspecified: Secondary | ICD-10-CM | POA: Diagnosis not present

## 2022-07-14 DIAGNOSIS — E559 Vitamin D deficiency, unspecified: Secondary | ICD-10-CM

## 2022-07-14 DIAGNOSIS — I10 Essential (primary) hypertension: Secondary | ICD-10-CM | POA: Diagnosis not present

## 2022-07-14 DIAGNOSIS — Z6841 Body Mass Index (BMI) 40.0 and over, adult: Secondary | ICD-10-CM

## 2022-07-14 DIAGNOSIS — E1169 Type 2 diabetes mellitus with other specified complication: Secondary | ICD-10-CM | POA: Diagnosis not present

## 2022-07-14 MED ORDER — VITAMIN D (ERGOCALCIFEROL) 1.25 MG (50000 UNIT) PO CAPS
50000.0000 [IU] | ORAL_CAPSULE | ORAL | 0 refills | Status: DC
Start: 2022-07-14 — End: 2022-10-21
  Filled 2022-07-14: qty 12, 84d supply, fill #0

## 2022-07-14 NOTE — Progress Notes (Signed)
Chief Complaint:   OBESITY Rhonda Aguilar is here to discuss her progress with her obesity treatment plan along with follow-up of her obesity related diagnoses. Rhonda Aguilar is on keeping a food journal and adhering to recommended goals of 1200-1300 calories and 90+ grams of protein and states she is following her eating plan approximately 50% of the time. Rhonda Aguilar states she walked a lot while on vacation 5,000 steps 2 times.    Today's visit was #: 70 Starting weight: 231 lbs Starting date: 12/08/2017 Today's weight: 230 lbs Today's date: 07/14/2022 Total lbs lost to date: 1 Total lbs lost since last in-office visit: 1  Interim History: Rhonda Aguilar has been on vacation and she was more active. She did some celebration eating, but she was still able to lose some weight. She worked on increasing seafood and she worked on portion control. She is considering having weight loss surgery, but her current insurance will not cover.   Subjective:   1. Essential hypertension Zania's blood pressure was elevated but improved with the second blood pressure check. She is on valsartan, Verapamil, and hydrochlorothiazide.   2. Type 2 diabetes mellitus with other specified complication, without long-term current use of insulin ([name]) Rhonda Aguilar had a recent A1c of 6.4. She notes her fasting glucose are running in the 160's.   3. Vitamin D deficiency Rhonda Aguilar's Vitamin D level was not yet at goal. She is doing well with her diet.   Assessment/Plan:   1. Essential hypertension Rhonda Aguilar is to continue to work on her diet and exercise. She will continue her medications and will monitor for signs of hypotension with continued weight loss.   2. Type 2 diabetes mellitus with other specified complication, without long-term current use of insulin ([name]) Rhonda Aguilar is to check her evening glucose and continue with her diet and increase her exercise.   3. Vitamin D deficiency Rhonda Aguilar will continue prescription Vitamin D, and we will refill for  90 days.   - Vitamin D, Ergocalciferol, (DRISDOL) 1.25 MG (50000 UNIT) CAPS capsule; Take 1 capsule (50,000 Units total) by mouth once a week.  Dispense: 12 capsule; Refill: 0  4. BMI 40.0-44.9, adult ([name])  5. Obesity, Beginning BMI 42.25 Rhonda Aguilar is currently in the action stage of change. As such, her goal is to continue with weight loss efforts. She has agreed to keeping a food journal and adhering to recommended goals of 1200-1300 calories and 90+ grams of protein daily.   Behavioral modification strategies: increasing lean protein intake and keeping a strict food journal.  Rhonda Aguilar has agreed to follow-up with our clinic in 4 weeks. She was informed of the importance of frequent follow-up visits to maximize her success with intensive lifestyle modifications for her multiple health conditions.   Objective:   Blood pressure 139/78, pulse 80, temperature 98.3 F (36.8 C), height 5\' 2"  (1.575 m), weight 230 lb (104.3 kg), SpO2 95 %. Body mass index is 42.07 kg/m.  Lab Results  Component Value Date   CREATININE 0.75 11/26/2021   BUN 14 11/26/2021   NA 143 11/26/2021   K 4.0 11/26/2021   CL 103 11/26/2021   CO2 24 11/26/2021   Lab Results  Component Value Date   ALT 88 (H) 11/26/2021   AST 62 (H) 11/26/2021   ALKPHOS 79 11/26/2021   BILITOT 0.4 11/26/2021   Lab Results  Component Value Date   HGBA1C 6.4 (H) 11/26/2021   HGBA1C 6.7 (H) 02/09/2021   HGBA1C 6.2 (H) 04/08/2020   HGBA1C  7.0 (H) 08/22/2019   HGBA1C 6.7 (H) 04/19/2019   Lab Results  Component Value Date   INSULIN 37.9 (H) 11/26/2021   INSULIN 29.1 (H) 02/09/2021   INSULIN 43.2 (H) 04/08/2020   INSULIN 33.6 (H) 08/22/2019   INSULIN 40.0 (H) 04/19/2019   Lab Results  Component Value Date   TSH 2.550 12/08/2017   Lab Results  Component Value Date   CHOL 195 11/26/2021   HDL 44 11/26/2021   LDLCALC 111 (H) 11/26/2021   TRIG 228 (H) 11/26/2021   CHOLHDL 4.4 11/26/2021   Lab Results  Component Value Date    VD25OH 34.5 11/26/2021   VD25OH 42.3 02/09/2021   VD25OH 46.6 04/08/2020   Lab Results  Component Value Date   WBC 7.6 09/05/2019   HGB 15.0 09/05/2019   HCT 47.0 (H) 09/05/2019   MCV 85.0 09/05/2019   PLT 228 09/05/2019   No results found for: "IRON", "TIBC", "FERRITIN"  Attestation Statements:   Reviewed by clinician on day of visit: allergies, medications, problem list, medical history, surgical history, family history, social history, and previous encounter notes.   I, Burt Knack, am acting as transcriptionist for Quillian Quince, MD.  I have reviewed the above documentation for accuracy and completeness, and I agree with the above. -  Quillian Quince, MD

## 2022-07-17 DIAGNOSIS — G4730 Sleep apnea, unspecified: Secondary | ICD-10-CM | POA: Diagnosis not present

## 2022-07-17 DIAGNOSIS — G4733 Obstructive sleep apnea (adult) (pediatric): Secondary | ICD-10-CM | POA: Diagnosis not present

## 2022-08-11 ENCOUNTER — Encounter (INDEPENDENT_AMBULATORY_CARE_PROVIDER_SITE_OTHER): Payer: Self-pay | Admitting: Family Medicine

## 2022-08-11 ENCOUNTER — Ambulatory Visit (INDEPENDENT_AMBULATORY_CARE_PROVIDER_SITE_OTHER): Payer: Medicare HMO | Admitting: Family Medicine

## 2022-08-11 VITALS — BP 134/85 | HR 85 | Temp 98.1°F | Ht 62.0 in | Wt 230.0 lb

## 2022-08-11 DIAGNOSIS — E1169 Type 2 diabetes mellitus with other specified complication: Secondary | ICD-10-CM | POA: Diagnosis not present

## 2022-08-11 DIAGNOSIS — Z6841 Body Mass Index (BMI) 40.0 and over, adult: Secondary | ICD-10-CM

## 2022-08-11 DIAGNOSIS — Z7985 Long-term (current) use of injectable non-insulin antidiabetic drugs: Secondary | ICD-10-CM

## 2022-08-11 DIAGNOSIS — E669 Obesity, unspecified: Secondary | ICD-10-CM | POA: Diagnosis not present

## 2022-08-11 NOTE — Progress Notes (Unsigned)
Chief Complaint:   OBESITY Rhonda Aguilar is here to discuss her progress with her obesity treatment plan along with follow-up of her obesity related diagnoses. Rhonda Aguilar is on keeping a food journal and adhering to recommended goals of 1200-1300 calories and 90+ grams of protein and states she is following her eating plan approximately 50% of the time. Rhonda Aguilar states she is walking for 30 minutes 2 times per week.  Today's visit was #: 71 Starting weight: 231 lbs Starting date: 12/08/2017 Today's weight: 230 lbs Today's date: 08/11/2022 Total lbs lost to date: 1 Total lbs lost since last in-office visit: 0  Interim History: Patient has been traveling and she notes increased family stress. Her exercise has been limited by social anxiety.   Subjective:   1. Type 2 diabetes mellitus with other specified complication, without long-term current use of insulin ([name]) Patient has been on Ozempic 1 mg for 1 year. She notes diarrhea with fatty foods.   Assessment/Plan:   1. Type 2 diabetes mellitus with other specified complication, without long-term current use of insulin ([name]) Patient will continue Ozempic 1 mg once weekly.   2. BMI 40.0-44.9, adult ([name])  3. Obesity, Beginning BMI 42.25 Rhonda Aguilar is currently in the action stage of change. As such, her goal is to continue with weight loss efforts. She has agreed to keeping a food journal and adhering to recommended goals of 1200-1300 calories and 90+ grams of protein daily.   Exercise goals: As is.   Behavioral modification strategies: increasing lean protein intake.  Rhonda Aguilar has agreed to follow-up with our clinic in 4 weeks. She was informed of the importance of frequent follow-up visits to maximize her success with intensive lifestyle modifications for her multiple health conditions.   Objective:   Blood pressure 134/85, pulse 85, temperature 98.1 F (36.7 C), height 5\' 2"  (1.575 m), weight 230 lb (104.3 kg), SpO2 96 %. Body mass index is  42.07 kg/m.  Lab Results  Component Value Date   CREATININE 0.75 11/26/2021   BUN 14 11/26/2021   NA 143 11/26/2021   K 4.0 11/26/2021   CL 103 11/26/2021   CO2 24 11/26/2021   Lab Results  Component Value Date   ALT 88 (H) 11/26/2021   AST 62 (H) 11/26/2021   ALKPHOS 79 11/26/2021   BILITOT 0.4 11/26/2021   Lab Results  Component Value Date   HGBA1C 6.4 (H) 11/26/2021   HGBA1C 6.7 (H) 02/09/2021   HGBA1C 6.2 (H) 04/08/2020   HGBA1C 7.0 (H) 08/22/2019   HGBA1C 6.7 (H) 04/19/2019   Lab Results  Component Value Date   INSULIN 37.9 (H) 11/26/2021   INSULIN 29.1 (H) 02/09/2021   INSULIN 43.2 (H) 04/08/2020   INSULIN 33.6 (H) 08/22/2019   INSULIN 40.0 (H) 04/19/2019   Lab Results  Component Value Date   TSH 2.550 12/08/2017   Lab Results  Component Value Date   CHOL 195 11/26/2021   HDL 44 11/26/2021   LDLCALC 111 (H) 11/26/2021   TRIG 228 (H) 11/26/2021   CHOLHDL 4.4 11/26/2021   Lab Results  Component Value Date   VD25OH 34.5 11/26/2021   VD25OH 42.3 02/09/2021   VD25OH 46.6 04/08/2020   Lab Results  Component Value Date   WBC 7.6 09/05/2019   HGB 15.0 09/05/2019   HCT 47.0 (H) 09/05/2019   MCV 85.0 09/05/2019   PLT 228 09/05/2019   No results found for: "IRON", "TIBC", "FERRITIN"  Attestation Statements:   Reviewed by clinician on  day of visit: allergies, medications, problem list, medical history, surgical history, family history, social history, and previous encounter notes.  I have personally spent 40 minutes total time today in preparation, patient care, and documentation for this visit, including the following: review of clinical lab tests; review of medical tests/procedures/services.   I, Burt Knack, am acting as transcriptionist for Quillian Quince, MD.  I have reviewed the above documentation for accuracy and completeness, and I agree with the above. -  Quillian Quince, MD

## 2022-08-12 ENCOUNTER — Other Ambulatory Visit: Payer: Self-pay

## 2022-08-17 DIAGNOSIS — G4730 Sleep apnea, unspecified: Secondary | ICD-10-CM | POA: Diagnosis not present

## 2022-08-17 DIAGNOSIS — G4733 Obstructive sleep apnea (adult) (pediatric): Secondary | ICD-10-CM | POA: Diagnosis not present

## 2022-08-18 ENCOUNTER — Other Ambulatory Visit (HOSPITAL_COMMUNITY): Payer: Self-pay

## 2022-08-18 ENCOUNTER — Other Ambulatory Visit (HOSPITAL_BASED_OUTPATIENT_CLINIC_OR_DEPARTMENT_OTHER): Payer: Self-pay

## 2022-08-18 DIAGNOSIS — L989 Disorder of the skin and subcutaneous tissue, unspecified: Secondary | ICD-10-CM | POA: Diagnosis not present

## 2022-08-18 DIAGNOSIS — Z6841 Body Mass Index (BMI) 40.0 and over, adult: Secondary | ICD-10-CM | POA: Diagnosis not present

## 2022-08-18 DIAGNOSIS — M25562 Pain in left knee: Secondary | ICD-10-CM | POA: Diagnosis not present

## 2022-08-18 DIAGNOSIS — I10 Essential (primary) hypertension: Secondary | ICD-10-CM | POA: Diagnosis not present

## 2022-08-18 DIAGNOSIS — H04123 Dry eye syndrome of bilateral lacrimal glands: Secondary | ICD-10-CM | POA: Diagnosis not present

## 2022-08-18 MED ORDER — MUPIROCIN 2 % EX OINT
TOPICAL_OINTMENT | CUTANEOUS | 0 refills | Status: AC
  Filled 2022-08-18: qty 22, 22d supply, fill #0

## 2022-08-20 DIAGNOSIS — H04123 Dry eye syndrome of bilateral lacrimal glands: Secondary | ICD-10-CM | POA: Diagnosis not present

## 2022-08-27 ENCOUNTER — Other Ambulatory Visit (HOSPITAL_COMMUNITY): Payer: Self-pay

## 2022-09-08 ENCOUNTER — Ambulatory Visit (INDEPENDENT_AMBULATORY_CARE_PROVIDER_SITE_OTHER): Payer: Medicare HMO | Admitting: Family Medicine

## 2022-09-08 ENCOUNTER — Encounter (INDEPENDENT_AMBULATORY_CARE_PROVIDER_SITE_OTHER): Payer: Self-pay | Admitting: Family Medicine

## 2022-09-08 ENCOUNTER — Other Ambulatory Visit: Payer: Self-pay

## 2022-09-08 VITALS — BP 179/85 | HR 80 | Temp 97.9°F | Ht 62.0 in | Wt 229.0 lb

## 2022-09-08 DIAGNOSIS — E669 Obesity, unspecified: Secondary | ICD-10-CM | POA: Diagnosis not present

## 2022-09-08 DIAGNOSIS — Z7985 Long-term (current) use of injectable non-insulin antidiabetic drugs: Secondary | ICD-10-CM | POA: Diagnosis not present

## 2022-09-08 DIAGNOSIS — E1169 Type 2 diabetes mellitus with other specified complication: Secondary | ICD-10-CM | POA: Diagnosis not present

## 2022-09-08 DIAGNOSIS — Z6841 Body Mass Index (BMI) 40.0 and over, adult: Secondary | ICD-10-CM | POA: Diagnosis not present

## 2022-09-08 NOTE — Progress Notes (Addendum)
.[facility]  Office: [phone]  /  Fax: [phone]  WEIGHT SUMMARY AND BIOMETRICS  Anthropometric Measurements Height: 5\' 2"  (1.575 m) Weight: 229 lb (103.9 kg) BMI (Calculated): 41.87 Weight at Last Visit: 230 lb Weight Lost Since Last Visit: 1 lb Weight Gained Since Last Visit: 0 Starting Weight: 231 lb   Body Composition  Body Fat %: 50.6 % Fat Mass (lbs): 115.8 lbs Muscle Mass (lbs): 107.4 lbs Total Body Water (lbs): 82.6 lbs Visceral Fat Rating : 18   Other Clinical Data Fasting: No Labs: No Today's Visit #: 72 Starting Date: 08/11/22    Chief Complaint: OBESITY    History of Present Illness   The patient is a 67 year old individual who presents with concerns related to obesity and dietary management. Over the past month, the patient has lost one pound and has been maintaining a food journal with a goal of 1200-1300 calories and 90 grams of protein daily. The patient reports achieving these goals approximately 50% of the time. The patient's exercise regimen includes walking for 30 minutes twice a week.  The patient has been experiencing decreased appetite, which has led to a shift in dietary habits. The patient reports difficulty consuming protein-rich foods due to their heaviness, particularly in the warm weather. The patient has been consuming cod and tilapia as they are lighter and easier to digest. The patient also reports occasional consumption of sushi with vegetables and shrimp for the same reason.  The patient has been experiencing gastrointestinal issues, with alternating periods of diarrhea and constipation. The patient reports that fatty foods, particularly mayonnaise, seem to exacerbate these symptoms. The patient is currently on Ozempic, which they believe may be contributing to these gastrointestinal issues and decreased appetite.  The patient also reports knee pain that has not improved with yoga. The patient is considering attending a yoga class to  address this issue. The patient also mentions a recent cold that resulted in a day off work.  The patient has been struggling with meal planning and cooking, often finding it difficult to prepare meals in advance. The patient has been trying to incorporate more vegetables into their diet, such as cucumbers and tomatoes from their garden. The patient also mentions a desire to find low-calorie wrap options for meals.  The patient's blood glucose levels have been running in the 150s-160s, with one instance of a reading of 180 during a night of feeling particularly nauseated. The patient is not currently on metformin or any other medication for blood sugar management due to past adverse reactions.          PHYSICAL EXAM:  Blood pressure (!) 179/85, pulse 80, temperature 97.9 F (36.6 C), height 5\' 2"  (1.575 m), weight 229 lb (103.9 kg), SpO2 96%. Body mass index is 41.88 kg/m.  DIAGNOSTIC DATA REVIEWED:  BMET    Component Value Date/Time   NA 143 11/26/2021 0919   K 4.0 11/26/2021 0919   CL 103 11/26/2021 0919   CO2 24 11/26/2021 0919   GLUCOSE 118 (H) 11/26/2021 0919   GLUCOSE 192 (H) 09/05/2019 1030   BUN 14 11/26/2021 0919   CREATININE 0.75 11/26/2021 0919   CALCIUM 9.4 11/26/2021 0919   GFRNONAA 71 04/08/2020 1012   GFRAA 81 04/08/2020 1012   Lab Results  Component Value Date   HGBA1C 6.4 (H) 11/26/2021   HGBA1C 6.7 (H) 04/19/2019   Lab Results  Component Value Date   INSULIN 37.9 (H) 11/26/2021   INSULIN 38.2 (H) 12/08/2017   Lab Results  Component Value Date   TSH 2.550 12/08/2017   CBC    Component Value Date/Time   WBC 7.6 09/05/2019 1030   RBC 5.53 (H) 09/05/2019 1030   HGB 15.0 09/05/2019 1030   HGB 12.9 12/08/2017 1306   HCT 47.0 (H) 09/05/2019 1030   HCT 38.1 12/08/2017 1306   PLT 228 09/05/2019 1030   MCV 85.0 09/05/2019 1030   MCV 82 12/08/2017 1306   MCH 27.1 09/05/2019 1030   MCHC 31.9 09/05/2019 1030   RDW 15.4 09/05/2019 1030   RDW 14.0  12/08/2017 1306   Iron Studies No results found for: "IRON", "TIBC", "FERRITIN", "IRONPCTSAT" Lipid Panel     Component Value Date/Time   CHOL 195 11/26/2021 0919   TRIG 228 (H) 11/26/2021 0919   HDL 44 11/26/2021 0919   CHOLHDL 4.4 11/26/2021 0919   LDLCALC 111 (H) 11/26/2021 0919   Hepatic Function Panel     Component Value Date/Time   PROT 6.5 11/26/2021 0919   ALBUMIN 4.6 11/26/2021 0919   AST 62 (H) 11/26/2021 0919   ALT 88 (H) 11/26/2021 0919   ALKPHOS 79 11/26/2021 0919   BILITOT 0.4 11/26/2021 0919      Component Value Date/Time   TSH 2.550 12/08/2017 1306   Nutritional Lab Results  Component Value Date   VD25OH 34.5 11/26/2021   VD25OH 42.3 02/09/2021   VD25OH 46.6 04/08/2020     Assessment and Plan    Obesity: Modest weight loss achieved with dietary changes and increased physical activity. Struggling with consistency in meeting dietary goals and experiencing decreased appetite. -Continue with current dietary plan aiming for 1200-1300 calories per day with at least 90 grams of protein. -Increase physical activity as tolerated, aiming for more than 60 minutes per week. -Consider meal planning strategies for easier adherence to dietary goals.  Type 2 Diabetes: Elevated fasting blood glucose levels despite Ozempic therapy. Reports of nausea and altered bowel habits possibly related to Ozempic. -Continue Ozempic at current dose. -Check fasting blood glucose regularly and report values to healthcare provider. -Consider dietary modifications to manage gastrointestinal side effects.  General Health Maintenance: -Stay hydrated, especially during periods of high heat. -Consider attending yoga classes for additional physical activity and potential stress relief. -Schedule follow-up in one month to reassess weight loss progress, blood glucose control, and overall health.         I have personally spent 35 minutes total time today in preparation, patient care,  and documentation for this visit, including the following: review of clinical lab tests; review of medical tests/procedures/services.    She was informed of the importance of frequent follow up visits to maximize her success with intensive lifestyle modifications for her multiple health conditions.    Quillian Quince, MD

## 2022-09-22 DIAGNOSIS — H04123 Dry eye syndrome of bilateral lacrimal glands: Secondary | ICD-10-CM | POA: Diagnosis not present

## 2022-09-22 DIAGNOSIS — H02889 Meibomian gland dysfunction of unspecified eye, unspecified eyelid: Secondary | ICD-10-CM | POA: Diagnosis not present

## 2022-09-27 DIAGNOSIS — L820 Inflamed seborrheic keratosis: Secondary | ICD-10-CM | POA: Diagnosis not present

## 2022-09-27 DIAGNOSIS — Z1283 Encounter for screening for malignant neoplasm of skin: Secondary | ICD-10-CM | POA: Diagnosis not present

## 2022-09-27 DIAGNOSIS — Z85828 Personal history of other malignant neoplasm of skin: Secondary | ICD-10-CM | POA: Diagnosis not present

## 2022-09-27 DIAGNOSIS — Z820 Family history of epilepsy and other diseases of the nervous system: Secondary | ICD-10-CM | POA: Diagnosis not present

## 2022-09-27 DIAGNOSIS — D225 Melanocytic nevi of trunk: Secondary | ICD-10-CM | POA: Diagnosis not present

## 2022-09-27 DIAGNOSIS — Z08 Encounter for follow-up examination after completed treatment for malignant neoplasm: Secondary | ICD-10-CM | POA: Diagnosis not present

## 2022-09-29 DIAGNOSIS — G4733 Obstructive sleep apnea (adult) (pediatric): Secondary | ICD-10-CM | POA: Diagnosis not present

## 2022-09-29 DIAGNOSIS — G4730 Sleep apnea, unspecified: Secondary | ICD-10-CM | POA: Diagnosis not present

## 2022-10-05 ENCOUNTER — Other Ambulatory Visit: Payer: Self-pay

## 2022-10-06 ENCOUNTER — Ambulatory Visit (INDEPENDENT_AMBULATORY_CARE_PROVIDER_SITE_OTHER): Payer: Medicare HMO | Admitting: Family Medicine

## 2022-10-08 ENCOUNTER — Encounter: Payer: Self-pay | Admitting: Physician Assistant

## 2022-10-08 ENCOUNTER — Ambulatory Visit: Payer: Medicare HMO | Admitting: Physician Assistant

## 2022-10-08 DIAGNOSIS — G4733 Obstructive sleep apnea (adult) (pediatric): Secondary | ICD-10-CM

## 2022-10-08 DIAGNOSIS — F411 Generalized anxiety disorder: Secondary | ICD-10-CM | POA: Diagnosis not present

## 2022-10-08 NOTE — Progress Notes (Signed)
[facility]  Patient ID: Rhonda Aguilar,  MRN: [id number]  PCP: Shon Hale, MD  Date of Evaluation: 10/08/2022 Time spent:20 minutes  Chief Complaint:  Chief Complaint   Anxiety; Follow-up    HISTORY/CURRENT STATUS: For f/u of anxiety.  Retiring in 2 weeks. Has been more anxious for the past few days, more irritable but she thinks it's all related to the big life change.   Patient is able to enjoy things.  Energy and motivation are fairly good.  No extreme sadness, tearfulness, or feelings of hopelessness.  Sleeps well most of the time. ADLs and personal hygiene are normal.   Denies any changes in concentration, making decisions, or remembering things.  Appetite has not changed.  Weight is stable.  Denies suicidal or homicidal thoughts.   Denies dizziness, syncope, seizures, numbness, tingling, tremor, unsteady gait, slurred speech, confusion. Denies muscle or joint pain, stiffness, or dystonia.  Individual Medical History/ Review of Systems: Changes? :No     Past medications for mental health diagnoses include:  Pristiq, Zoloft caused her to be more depressed.  Allergies: Glyburide, Metformin and related, Saxenda [liraglutide -weight management], Codeine, Penicillins, and Sulfa antibiotics  Current Medications:  Current Outpatient Medications:    busPIRone (BUSPAR) 5 MG tablet, Take 1 tablet (5 mg total) by mouth 2 (two) times daily. (Patient taking differently: Take 5 mg by mouth daily.), Disp: 180 tablet, Rfl: 3   cetirizine (ZYRTEC) 10 MG tablet, Take 10 mg by mouth at bedtime. , Disp: , Rfl:    clobetasol ointment (TEMOVATE) 0.05 %, Apply to affected area twice a day as needed, Disp: 15 g, Rfl: 0   dexlansoprazole (DEXILANT) 60 MG capsule, TAKE 1 CAPSULE BY MOUTH ONCE DAILY  20 MINUTES BEFORE BREAKFAST, Disp: 90 capsule, Rfl: 4   dexlansoprazole (DEXILANT) 60 MG capsule, Take 1 capsule (60 mg total) by mouth daily 20 minutes before breakfast, Disp: 90  capsule, Rfl: 4   hydrochlorothiazide (HYDRODIURIL) 25 MG tablet, Take 12.5 mg by mouth as needed. , Disp: , Rfl:    ibuprofen (ADVIL) 200 MG tablet, Take 600 mg by mouth every 8 (eight) hours as needed for mild pain or moderate pain. , Disp: , Rfl:    meclizine (ANTIVERT) 25 MG tablet, Take 1 tablet by mouth  as needed three times a day for 7 days., Disp: 21 tablet, Rfl: 0   Multiple Vitamin (MULTIVITAMIN) tablet, Take 1 tablet by mouth daily. Ultra gastric tabs / supplements, Disp: , Rfl:    mupirocin ointment (BACTROBAN) 2 %, Apply to the affected area 2 times a day for 5 days, Disp: 60 g, Rfl: 0   polyethylene glycol powder (GLYCOLAX/MIRALAX) 17 GM/SCOOP powder, Take 17 g by mouth daily as needed., Disp: 3350 g, Rfl: 0   rosuvastatin (CRESTOR) 5 MG tablet, Take 1 tablet (5 mg total) by mouth once a week., Disp: 13 tablet, Rfl: 0   Semaglutide, 1 MG/DOSE, (OZEMPIC, 1 MG/DOSE,) 4 MG/3ML SOPN, Inject 1 mg into the skin once a week., Disp: 12 mL, Rfl: 3   valsartan (DIOVAN) 320 MG tablet, TAKE 1 TABLET BY MOUTH ONCE A DAY, Disp: 90 tablet, Rfl: 3   valsartan (DIOVAN) 320 MG tablet, Take 1 tablet (320 mg total) by mouth daily., Disp: 90 tablet, Rfl: 3   Verapamil HCl CR 300 MG CP24, Take 1 capsule (300 mg total) by mouth daily., Disp: 90 capsule, Rfl: 3   Vitamin D, Ergocalciferol, (DRISDOL) 1.25 MG (50000 UNIT) CAPS capsule, Take 1  capsule (50,000 Units total) by mouth once a week., Disp: 12 capsule, Rfl: 0 No current facility-administered medications for this visit.  Facility-Administered Medications Ordered in Other Visits:    sodium chloride flush (NS) 0.9 % injection 10 mL, 10 mL, Intravenous, PRN, Mayford Knife, Traci R, MD, 20 mL at 11/15/19 8469 Medication Side Effects: none  Family Medical/ Social History: Changes?  no   MENTAL HEALTH EXAM:  There were no vitals taken for this visit.There is no height or weight on file to calculate BMI.  General Appearance: Casual, Neat, Well Groomed and  Obese  Eye Contact:  Good  Speech:  Clear and Coherent, Normal Rate, and Talkative  Volume:  Normal  Mood:  Euthymic  Affect:  Appropriate  Thought Process:  Goal Directed and Descriptions of Associations: Circumstantial  Orientation:  Full (Time, Place, and Person)  Thought Content: Logical   Suicidal Thoughts:  No  Homicidal Thoughts:  No  Memory:  WNL  Judgement:  Good  Insight:  Good  Psychomotor Activity:  Normal  Concentration:  Concentration: Good and Attention Span: Good  Recall:  Good  Fund of Knowledge: Good  Language: Good  Assets:  Desire for Improvement Financial Resources/Insurance Housing Transportation Vocational/Educational  ADL's:  Intact  Cognition: WNL  Prognosis:  Good   DIAGNOSES:    ICD-10-CM   1. Generalized anxiety disorder  F41.1     2. Obstructive sleep apnea  G47.33      Receiving Psychotherapy: No   RECOMMENDATIONS:  PDMP reviewed. No controlled substances. I provided 20 minutes of face to face time during this encounter, including time spent before and after the visit in records review, medical decision making, counseling pertinent to today's visit, and charting.   Congrats on her retirement!  Reminded her she can increase the Buspar to bid or tid. She's on a very low dose anyway.   Continue BuSpar 5 mg, 1 po at bedtime.  Continue Xanax 0.5 mg, 1/2-1 p.o. 3 times daily as needed.  Return in 9 months.  Melony Overly, PA-C

## 2022-10-13 ENCOUNTER — Ambulatory Visit: Payer: Medicare HMO | Admitting: Adult Health

## 2022-10-13 ENCOUNTER — Other Ambulatory Visit (HOSPITAL_COMMUNITY): Payer: Self-pay

## 2022-10-13 ENCOUNTER — Encounter: Payer: Self-pay | Admitting: Adult Health

## 2022-10-13 VITALS — BP 151/96 | HR 85 | Ht 62.0 in | Wt 233.0 lb

## 2022-10-13 DIAGNOSIS — G4733 Obstructive sleep apnea (adult) (pediatric): Secondary | ICD-10-CM

## 2022-10-13 NOTE — Progress Notes (Signed)
PATIENT: Rhonda Aguilar DOB: [birth year]  REASON FOR VISIT: follow up HISTORY FROM: patient Primary neurologist: Dr. Vickey Huger  Chief Complaint  Patient presents with   Follow-up    Pt in 19 Pt here for CPAP f/u Pt states doing well with cpap machine Pt states can't sleep without cpap machine      HISTORY OF PRESENT ILLNESS: Today 10/13/22:  Rhonda Aguilar is a 67 y.o. female with a history of OSA on CPAP. Returns today for follow-up. Reports that CPAP is working well. Doesn't like to sleep without it. DL is below.      10/12/2021: Rhonda Aguilar is a 67 year old female with a history if OSA on CPAP.  She returns today for follow-up.  Her CPAP report is below.  She reports that the CPAP works well for her.  She continues to notice the benefit.  Reports that she feels well rested throughout the day.  Does report dry mouth 3-4 times a month.  She has used a chinstrap  but didn't help. Never adjusted humidity.     09/11/20: Rhonda Aguilar is a 67 year old female with a history of obstructive sleep apnea on CPAP.  She reports that the CPAP is working well for her.  She denies any new issues.  She returns today for an evaluation.    06/12/19: Rhonda Aguilar is a 67 year old female with a history of obstructive sleep apnea on CPAP.  Her download indicates that she use her machine nightly for compliance of 100%.  Leak in the 95th percentile is 8.2.  She reports that the CPAP is working well for her.  She returns today for an evaluation.  She is machine greater than 4 hours each night.  On average she uses her machine 7 hours and 51 minutes.  Her residual AHI is 0.3 on 6 to 16 cm of water with EPR of 1.  HISTORY ZELLIE STENSON is a 67 y.o. female with underlying medical history of HTN, DM, migraines, obesity and OSA who was recently evaluated in this office by Dr. Vickey Huger for OSA evaluation on 01/17/2018.  She had been previously diagnosed with OSA in 2012 by this office and continued to use CPAP for  management but was referred back to this office as she was lost to follow-up to ensure settings were still appropriate and OSA adequately managed.  She also had complaints of ocular aura migraines with hemiparetic component with history of migraines (no prior auras) but previously controlled until recently initiating oral metformin for newly diagnosed diabetes.  It was recommended for her to obtain home sleep study without use of CPAP to ensure adequate settings which was obtained on 02/16/2018 and showed AHI 24.9 and REM AHI 52.3 therefore recommended auto titration device with 6 to 16 cm water pressure with a EPR of 2.  Compliance report obtained from 04/02/2018 -05/01/2018 showing 30 out of 30 usage days and 30 days greater than 4 hours 400% compliance.  Average usage 7 hours and 15 minutes with residual AHI 0.2.  Leaks in the 95th percentile 11.7 L/min and pressure in the 95th percentile 11.3 cm H2O with pressure settings 6 cm H2O to 16 cm H2O with EPR level 1.  She does not have any complaints with current CPAP machine or nasal pillows.  [facility].  She denies any excessive daytime fatigue or insomnia.  Migraines have resolved since discontinuation of diabetic medication.  She continues to have A1c monitored by PCP with most recent level 6.2 not on any diabetic medication.  No further concerns at this time.  She returns today for reevaluation.    REVIEW OF SYSTEMS: Out of a complete 14 system review of symptoms, the patient complains only of the following symptoms, and all other reviewed systems are negative.   ESS 3   ALLERGIES: Allergies  Allergen Reactions   Glyburide Other (See Comments)    Migraines    Metformin And Related Other (See Comments)    migraines   Saxenda [Liraglutide -Weight Management] Nausea Only   Codeine Itching and Rash   Penicillins Rash   Sulfa Antibiotics Rash    HOME MEDICATIONS: Outpatient  Medications Prior to Visit  Medication Sig Dispense Refill   busPIRone (BUSPAR) 5 MG tablet Take 1 tablet (5 mg total) by mouth 2 (two) times daily. (Patient taking differently: Take 5 mg by mouth daily.) 180 tablet 3   cetirizine (ZYRTEC) 10 MG tablet Take 10 mg by mouth at bedtime.      clobetasol ointment (TEMOVATE) 0.05 % Apply to affected area twice a day as needed 15 g 0   dexlansoprazole (DEXILANT) 60 MG capsule TAKE 1 CAPSULE BY MOUTH ONCE DAILY  20 MINUTES BEFORE BREAKFAST 90 capsule 4   hydrochlorothiazide (HYDRODIURIL) 25 MG tablet Take 12.5 mg by mouth as needed.      ibuprofen (ADVIL) 200 MG tablet Take 600 mg by mouth every 8 (eight) hours as needed for mild pain or moderate pain.      meclizine (ANTIVERT) 25 MG tablet Take 1 tablet by mouth  as needed three times a day for 7 days. 21 tablet 0   Multiple Vitamin (MULTIVITAMIN) tablet Take 1 tablet by mouth daily. Ultra gastric tabs / supplements     mupirocin ointment (BACTROBAN) 2 % Apply to the affected area 2 times a day for 5 days 60 g 0   polyethylene glycol powder (GLYCOLAX/MIRALAX) 17 GM/SCOOP powder Take 17 g by mouth daily as needed. 3350 g 0   rosuvastatin (CRESTOR) 5 MG tablet Take 1 tablet (5 mg total) by mouth once a week. 13 tablet 0   Semaglutide, 1 MG/DOSE, (OZEMPIC, 1 MG/DOSE,) 4 MG/3ML SOPN Inject 1 mg into the skin once a week. 12 mL 3   valsartan (DIOVAN) 320 MG tablet Take 1 tablet (320 mg total) by mouth daily. 90 tablet 3   Verapamil HCl CR 300 MG CP24 Take 1 capsule (300 mg total) by mouth daily. 90 capsule 3   Vitamin D, Ergocalciferol, (DRISDOL) 1.25 MG (50000 UNIT) CAPS capsule Take 1 capsule (50,000 Units total) by mouth once a week. 12 capsule 0   dexlansoprazole (DEXILANT) 60 MG capsule Take 1 capsule (60 mg total) by mouth daily 20 minutes before breakfast (Patient not taking: Reported on 10/13/2022) 90 capsule 4   valsartan (DIOVAN) 320 MG tablet TAKE 1 TABLET BY MOUTH ONCE A DAY (Patient not taking:  Reported on 10/13/2022) 90 tablet 3   Facility-Administered Medications Prior to Visit  Medication Dose Route Frequency Provider Last Rate Last Admin   sodium chloride flush (NS) 0.9 % injection 10 mL  10 mL Intravenous PRN Quintella Reichert, MD   20 mL at 11/15/19 0925    PAST MEDICAL HISTORY: Past Medical History:  Diagnosis Date   Anxiety    Arthritis    Benign essential HTN 11/22/2014   Cervical lymphadenopathy 06/01/2022   Diabetes mellitus, type II ([name])  Dyspnea    On exertion   Fatty liver    GERD (gastroesophageal reflux disease)    Hypertension    IBS (irritable bowel syndrome)    Migraine    Obesity (BMI 30-39.9) 11/22/2014   OSA (obstructive sleep apnea)    Pneumonia     PAST SURGICAL HISTORY: Past Surgical History:  Procedure Laterality Date   BREAST BIOPSY Right 2014   CHOLECYSTECTOMY N/A 09/14/2019   Procedure: LAPAROSCOPIC CHOLECYSTECTOMY;  Surgeon: Berna Bue, MD;  Location: [facility];  Service: General;  Laterality: N/A;   COLONOSCOPY     TONSILLECTOMY      FAMILY HISTORY: Family History  Problem Relation Age of Onset   Diabetes Mother    Heart disease Mother    Hypertension Mother    High Cholesterol Mother    Kidney disease Mother    Depression Mother    Obesity Mother    Coronary artery disease Father    High blood pressure Father    High Cholesterol Father    Depression Father    Hypertension Brother    Sleep apnea Brother     SOCIAL HISTORY: Social History   Socioeconomic History   Marital status: Divorced    Spouse name: Not on file   Number of children: 0   Years of education: college   Highest education level: Not on file  Occupational History   Occupation: Freight forwarder cardiology  Tobacco Use   Smoking status: Never   Smokeless tobacco: Never  Vaping Use   Vaping status: Never Used  Substance and Sexual Activity   Alcohol use: Yes    Alcohol/week: 0.0 - 1.0 standard drinks of alcohol    Comment: occas.   Drug  use: Never   Sexual activity: Not on file  Other Topics Concern   Not on file  Social History Narrative   Not on file   Social Determinants of Health   Financial Resource Strain: Not on file  Food Insecurity: No Food Insecurity (11/11/2021)   Hunger Vital Sign    Worried About Running Out of Food in the Last Year: Never true    Ran Out of Food in the Last Year: Never true  Transportation Needs: No Transportation Needs (11/11/2021)   PRAPARE - Administrator, Civil Service (Medical): No    Lack of Transportation (Non-Medical): No  Physical Activity: Not on file  Stress: Not on file  Social Connections: Not on file  Intimate Partner Violence: Not on file      PHYSICAL EXAM  Vitals:   10/13/22 0958  BP: (!) 151/96  Pulse: 85  Weight: 233 lb (105.7 kg)  Height: 5\' 2"  (1.575 m)   Body mass index is 42.62 kg/m.  Generalized: Well developed, in no acute distress  Chest: Lungs clear to auscultation bilaterally  Neurological examination  Mentation: Alert oriented to time, place, history taking. Follows all commands speech and language fluent Cranial nerve II-XII: Facial symmetry noted   DIAGNOSTIC DATA (LABS, IMAGING, TESTING) - I reviewed patient records, labs, notes, testing and imaging myself where available.  Lab Results  Component Value Date   WBC 7.6 09/05/2019   HGB 15.0 09/05/2019   HCT 47.0 (H) 09/05/2019   MCV 85.0 09/05/2019   PLT 228 09/05/2019      Component Value Date/Time   NA 143 11/26/2021 0919   K 4.0 11/26/2021 0919   CL 103 11/26/2021 0919   CO2 24 11/26/2021 0919   GLUCOSE 118 (H)  11/26/2021 0919   GLUCOSE 192 (H) 09/05/2019 1030   BUN 14 11/26/2021 0919   CREATININE 0.75 11/26/2021 0919   CALCIUM 9.4 11/26/2021 0919   PROT 6.5 11/26/2021 0919   ALBUMIN 4.6 11/26/2021 0919   AST 62 (H) 11/26/2021 0919   ALT 88 (H) 11/26/2021 0919   ALKPHOS 79 11/26/2021 0919   BILITOT 0.4 11/26/2021 0919   GFRNONAA 71 04/08/2020 1012    GFRAA 81 04/08/2020 1012   Lab Results  Component Value Date   CHOL 195 11/26/2021   HDL 44 11/26/2021   LDLCALC 111 (H) 11/26/2021   TRIG 228 (H) 11/26/2021   CHOLHDL 4.4 11/26/2021   Lab Results  Component Value Date   HGBA1C 6.4 (H) 11/26/2021   No results found for: "VITAMINB12" Lab Results  Component Value Date   TSH 2.550 12/08/2017      ASSESSMENT AND PLAN 67 y.o. year old female  has a past medical history of Anxiety, Arthritis, Benign essential HTN (11/22/2014), Cervical lymphadenopathy (06/01/2022), Diabetes mellitus, type II ([name]), Dyspnea, Fatty liver, GERD (gastroesophageal reflux disease), Hypertension, IBS (irritable bowel syndrome), Migraine, Obesity (BMI 30-39.9) (11/22/2014), OSA (obstructive sleep apnea), and Pneumonia. here with:  OSA on CPAP  - CPAP compliance excellent - Good treatment of AHI  - Encourage patient to use CPAP nightly and > 4 hours each night - FU in 1 year or sooner if needed   Butch Penny, MSN, NP-C 10/13/2022, 10:13 AM [facility] [address] [place], [place] [address] [phone]

## 2022-10-21 ENCOUNTER — Other Ambulatory Visit (HOSPITAL_COMMUNITY): Payer: Self-pay

## 2022-10-21 ENCOUNTER — Encounter (INDEPENDENT_AMBULATORY_CARE_PROVIDER_SITE_OTHER): Payer: Self-pay | Admitting: Family Medicine

## 2022-10-21 ENCOUNTER — Ambulatory Visit (INDEPENDENT_AMBULATORY_CARE_PROVIDER_SITE_OTHER): Payer: Medicare HMO | Admitting: Family Medicine

## 2022-10-21 VITALS — BP 142/85 | HR 83 | Temp 98.5°F | Ht 62.0 in | Wt 227.0 lb

## 2022-10-21 DIAGNOSIS — E669 Obesity, unspecified: Secondary | ICD-10-CM | POA: Diagnosis not present

## 2022-10-21 DIAGNOSIS — E559 Vitamin D deficiency, unspecified: Secondary | ICD-10-CM

## 2022-10-21 DIAGNOSIS — I10 Essential (primary) hypertension: Secondary | ICD-10-CM

## 2022-10-21 DIAGNOSIS — Z6841 Body Mass Index (BMI) 40.0 and over, adult: Secondary | ICD-10-CM

## 2022-10-21 MED ORDER — VITAMIN D (ERGOCALCIFEROL) 1.25 MG (50000 UNIT) PO CAPS
50000.0000 [IU] | ORAL_CAPSULE | ORAL | 0 refills | Status: AC
Start: 2022-10-21 — End: ?
  Filled 2022-10-21: qty 12, 84d supply, fill #0

## 2022-10-22 ENCOUNTER — Other Ambulatory Visit (HOSPITAL_COMMUNITY): Payer: Self-pay

## 2022-10-26 NOTE — Progress Notes (Signed)
Chief Complaint:   OBESITY Rhonda Aguilar is here to discuss her progress with her obesity treatment plan along with follow-up of her obesity related diagnoses. Rhonda Aguilar is on keeping a food journal and adhering to recommended goals of 1200-1300 calories and 90+ grams of protein and states she is following her eating plan approximately 75% of the time. [name] states she is walking 2 miles 2 times per week.    Today's visit was #: 73 Starting weight: 231 lbs Starting date: 12/08/2017 Today's weight: 227 lbs Today's date: 10/21/2022 Total lbs lost to date: 4 Total lbs lost since last in-office visit: 2  Interim History: Patient continues to do well with her weight loss.  She has had some extra stress with retiring and a very sick friend.  She is struggling to meet her protein goals, but she is working on this.  She will be traveling soon to [place] to help her friend.  Subjective:   1. Essential hypertension Patient's blood pressure is mildly elevated today.  She had some recent stressful news.  She is doing well with her diet overall.  2. Vitamin D deficiency Patient's last vitamin D level was below goal.  She is doing well on vitamin D prescription, and she requests a refill.  She is overdue to have her labs done to monitor her progress.  Assessment/Plan:   1. Essential hypertension Patient will continue with her diet and exercise, and we will recheck her blood pressure at her next visit in 4 weeks.  2. Vitamin D deficiency Patient will continue prescription vitamin D 50,000 IU once weekly, and we will refill for 90 days.  - Vitamin D, Ergocalciferol, (DRISDOL) 1.25 MG (50000 UNIT) CAPS capsule; Take 1 capsule (50,000 Units total) by mouth once a week.  Dispense: 12 capsule; Refill: 0  3. BMI 40.0-44.9, adult ([name])  4. Obesity, Beginning BMI 42.25 Rhonda Aguilar is currently in the action stage of change. As such, her goal is to continue with weight loss efforts. She has agreed to keeping a food  journal and adhering to recommended goals of 1200-1300 calories and 90+ grams of protein daily.   Exercise goals: Increase water aerobics.   Behavioral modification strategies: increasing lean protein intake and travel eating strategies.  Rhonda Aguilar has agreed to follow-up with our clinic in 4 weeks. She was informed of the importance of frequent follow-up visits to maximize her success with intensive lifestyle modifications for her multiple health conditions.   Objective:   Blood pressure (!) 142/85, pulse 83, temperature 98.5 F (36.9 C), height 5\' 2"  (1.575 m), weight 227 lb (103 kg), SpO2 92%. Body mass index is 41.52 kg/m.  Lab Results  Component Value Date   CREATININE 0.75 11/26/2021   BUN 14 11/26/2021   NA 143 11/26/2021   K 4.0 11/26/2021   CL 103 11/26/2021   CO2 24 11/26/2021   Lab Results  Component Value Date   ALT 88 (H) 11/26/2021   AST 62 (H) 11/26/2021   ALKPHOS 79 11/26/2021   BILITOT 0.4 11/26/2021   Lab Results  Component Value Date   HGBA1C 6.4 (H) 11/26/2021   HGBA1C 6.7 (H) 02/09/2021   HGBA1C 6.2 (H) 04/08/2020   HGBA1C 7.0 (H) 08/22/2019   HGBA1C 6.7 (H) 04/19/2019   Lab Results  Component Value Date   INSULIN 37.9 (H) 11/26/2021   INSULIN 29.1 (H) 02/09/2021   INSULIN 43.2 (H) 04/08/2020   INSULIN 33.6 (H) 08/22/2019   INSULIN 40.0 (H) 04/19/2019  Lab Results  Component Value Date   TSH 2.550 12/08/2017   Lab Results  Component Value Date   CHOL 195 11/26/2021   HDL 44 11/26/2021   LDLCALC 111 (H) 11/26/2021   TRIG 228 (H) 11/26/2021   CHOLHDL 4.4 11/26/2021   Lab Results  Component Value Date   VD25OH 34.5 11/26/2021   VD25OH 42.3 02/09/2021   VD25OH 46.6 04/08/2020   Lab Results  Component Value Date   WBC 7.6 09/05/2019   HGB 15.0 09/05/2019   HCT 47.0 (H) 09/05/2019   MCV 85.0 09/05/2019   PLT 228 09/05/2019   No results found for: "IRON", "TIBC", "FERRITIN"  Attestation Statements:   Reviewed by clinician on day  of visit: allergies, medications, problem list, medical history, surgical history, family history, social history, and previous encounter notes.   I, Burt Knack, am acting as transcriptionist for Quillian Quince, MD.  I have reviewed the above documentation for accuracy and completeness, and I agree with the above. -  Quillian Quince, MD

## 2022-10-30 DIAGNOSIS — G4730 Sleep apnea, unspecified: Secondary | ICD-10-CM | POA: Diagnosis not present

## 2022-10-30 DIAGNOSIS — G4733 Obstructive sleep apnea (adult) (pediatric): Secondary | ICD-10-CM | POA: Diagnosis not present

## 2022-11-10 ENCOUNTER — Other Ambulatory Visit (HOSPITAL_COMMUNITY): Payer: Self-pay

## 2022-11-10 ENCOUNTER — Other Ambulatory Visit: Payer: Self-pay

## 2022-11-16 ENCOUNTER — Other Ambulatory Visit (HOSPITAL_COMMUNITY): Payer: Self-pay

## 2022-11-16 DIAGNOSIS — I10 Essential (primary) hypertension: Secondary | ICD-10-CM | POA: Diagnosis not present

## 2022-11-16 DIAGNOSIS — N898 Other specified noninflammatory disorders of vagina: Secondary | ICD-10-CM | POA: Diagnosis not present

## 2022-11-16 DIAGNOSIS — Z6841 Body Mass Index (BMI) 40.0 and over, adult: Secondary | ICD-10-CM | POA: Diagnosis not present

## 2022-11-16 MED ORDER — FLUCONAZOLE 150 MG PO TABS
ORAL_TABLET | ORAL | 0 refills | Status: DC
  Filled 2022-11-16: qty 2, 3d supply, fill #0

## 2022-11-24 ENCOUNTER — Other Ambulatory Visit (HOSPITAL_COMMUNITY): Payer: Self-pay

## 2022-11-25 ENCOUNTER — Other Ambulatory Visit: Payer: Self-pay

## 2022-11-25 ENCOUNTER — Ambulatory Visit (INDEPENDENT_AMBULATORY_CARE_PROVIDER_SITE_OTHER): Payer: Medicare HMO | Admitting: Family Medicine

## 2022-11-25 ENCOUNTER — Other Ambulatory Visit (HOSPITAL_COMMUNITY): Payer: Self-pay

## 2022-11-25 ENCOUNTER — Encounter (INDEPENDENT_AMBULATORY_CARE_PROVIDER_SITE_OTHER): Payer: Self-pay | Admitting: Family Medicine

## 2022-11-25 VITALS — BP 111/71 | HR 85 | Temp 98.0°F | Ht 62.0 in | Wt 225.0 lb

## 2022-11-25 DIAGNOSIS — E669 Obesity, unspecified: Secondary | ICD-10-CM

## 2022-11-25 DIAGNOSIS — K5909 Other constipation: Secondary | ICD-10-CM | POA: Diagnosis not present

## 2022-11-25 DIAGNOSIS — E559 Vitamin D deficiency, unspecified: Secondary | ICD-10-CM | POA: Diagnosis not present

## 2022-11-25 DIAGNOSIS — Z6841 Body Mass Index (BMI) 40.0 and over, adult: Secondary | ICD-10-CM | POA: Diagnosis not present

## 2022-11-25 DIAGNOSIS — F439 Reaction to severe stress, unspecified: Secondary | ICD-10-CM | POA: Diagnosis not present

## 2022-11-25 MED ORDER — CLOBETASOL PROPIONATE 0.05 % EX OINT
TOPICAL_OINTMENT | CUTANEOUS | 0 refills | Status: DC
  Filled 2022-11-25: qty 15, 8d supply, fill #0

## 2022-11-25 MED ORDER — POLYETHYLENE GLYCOL 3350 17 GM/SCOOP PO POWD
17.0000 g | Freq: Every day | ORAL | 0 refills | Status: DC | PRN
  Filled 2022-11-25: qty 1428, 84d supply, fill #0

## 2022-11-25 MED ORDER — VITAMIN D (ERGOCALCIFEROL) 1.25 MG (50000 UNIT) PO CAPS
50000.0000 [IU] | ORAL_CAPSULE | ORAL | 0 refills | Status: DC
Start: 2022-11-25 — End: 2022-12-22
  Filled 2022-11-25: qty 12, 84d supply, fill #0

## 2022-11-25 NOTE — Progress Notes (Signed)
Chief Complaint:   OBESITY Rhonda Aguilar is here to discuss her progress with her obesity treatment plan along with follow-up of her obesity related diagnoses. Rhonda Aguilar is on keeping a food journal and adhering to recommended goals of 1200-1300 calories and 90+ grams of protein and states she is following her eating plan approximately 30% of the time. Rhonda Aguilar states she is doing 0 minutes 0 times per week.  Today's visit was #: 74 Starting weight: 231 lbs Starting date: 12/08/2017 Today's weight: 225 lbs Today's date: 11/25/2022 Total lbs lost to date: 6 Total lbs lost since last in-office visit: 2  Interim History: Patient has done well with her weight loss despite traveling and extra challenges including recovering from a cold.  She is working on increasing her protein in her diet.  Subjective:   1. Stress Patient has had a lot of stressors and she feels burnt out at times.  She is mindful of her increase in emotional eating behavior.  2. Vitamin D deficiency Patient is on vitamin D with no side effects noted.  3. Other constipation Patient is stable on MiraLAX, and she requests a refill.  Assessment/Plan:   1. Stress Emotional eating behavior strategies were discussed with the patient today, and we will continue to monitor.  2. Vitamin D deficiency Patient will continue prescription vitamin D 50,000 IU once weekly, and we will refill for 90 days.  - Vitamin D, Ergocalciferol, (DRISDOL) 1.25 MG (50000 UNIT) CAPS capsule; Take 1 capsule (50,000 Units total) by mouth once a week.  Dispense: 12 capsule; Refill: 0  3. Other constipation Patient will continue MiraLAX 17 g daily as needed, and we will refill for 90 days.  - polyethylene glycol powder (GLYCOLAX/MIRALAX) 17 GM/SCOOP powder; Take 17 g by mouth daily as needed.  Dispense: 3350 g; Refill: 0  4. BMI 40.0-44.9, adult ([name])  5. Obesity, Beginning BMI 42.25 Rhonda Aguilar is currently in the action stage of change. As such, her goal  is to continue with weight loss efforts. She has agreed to keeping a food journal and adhering to recommended goals of 1200-1300 calories and 90 grams of protein daily.   Behavioral modification strategies: increasing lean protein intake and emotional eating strategies.  Rhonda Aguilar has agreed to follow-up with our clinic in 4 weeks. She was informed of the importance of frequent follow-up visits to maximize her success with intensive lifestyle modifications for her multiple health conditions.   Objective:   Blood pressure 111/71, pulse 85, temperature 98 F (36.7 C), height 5\' 2"  (1.575 m), weight 225 lb (102.1 kg), SpO2 94%. Body mass index is 41.15 kg/m.  Lab Results  Component Value Date   CREATININE 0.75 11/26/2021   BUN 14 11/26/2021   NA 143 11/26/2021   K 4.0 11/26/2021   CL 103 11/26/2021   CO2 24 11/26/2021   Lab Results  Component Value Date   ALT 88 (H) 11/26/2021   AST 62 (H) 11/26/2021   ALKPHOS 79 11/26/2021   BILITOT 0.4 11/26/2021   Lab Results  Component Value Date   HGBA1C 6.4 (H) 11/26/2021   HGBA1C 6.7 (H) 02/09/2021   HGBA1C 6.2 (H) 04/08/2020   HGBA1C 7.0 (H) 08/22/2019   HGBA1C 6.7 (H) 04/19/2019   Lab Results  Component Value Date   INSULIN 37.9 (H) 11/26/2021   INSULIN 29.1 (H) 02/09/2021   INSULIN 43.2 (H) 04/08/2020   INSULIN 33.6 (H) 08/22/2019   INSULIN 40.0 (H) 04/19/2019   Lab Results  Component Value  Date   TSH 2.550 12/08/2017   Lab Results  Component Value Date   CHOL 195 11/26/2021   HDL 44 11/26/2021   LDLCALC 111 (H) 11/26/2021   TRIG 228 (H) 11/26/2021   CHOLHDL 4.4 11/26/2021   Lab Results  Component Value Date   VD25OH 34.5 11/26/2021   VD25OH 42.3 02/09/2021   VD25OH 46.6 04/08/2020   Lab Results  Component Value Date   WBC 7.6 09/05/2019   HGB 15.0 09/05/2019   HCT 47.0 (H) 09/05/2019   MCV 85.0 09/05/2019   PLT 228 09/05/2019   No results found for: "IRON", "TIBC", "FERRITIN"  Attestation Statements:    Reviewed by clinician on day of visit: allergies, medications, problem list, medical history, surgical history, family history, social history, and previous encounter notes.   I, Burt Knack, am acting as transcriptionist for Quillian Quince, MD.  I have reviewed the above documentation for accuracy and completeness, and I agree with the above. -  Quillian Quince, MD

## 2022-11-29 DIAGNOSIS — G4733 Obstructive sleep apnea (adult) (pediatric): Secondary | ICD-10-CM | POA: Diagnosis not present

## 2022-11-29 DIAGNOSIS — G4730 Sleep apnea, unspecified: Secondary | ICD-10-CM | POA: Diagnosis not present

## 2022-12-01 ENCOUNTER — Other Ambulatory Visit (HOSPITAL_BASED_OUTPATIENT_CLINIC_OR_DEPARTMENT_OTHER): Payer: Self-pay

## 2022-12-01 MED ORDER — FLUAD 0.5 ML IM SUSY
PREFILLED_SYRINGE | INTRAMUSCULAR | 0 refills | Status: AC
  Filled 2022-12-01: qty 0.5, 1d supply, fill #0

## 2022-12-01 MED ORDER — COMIRNATY 30 MCG/0.3ML IM SUSY
PREFILLED_SYRINGE | INTRAMUSCULAR | 0 refills | Status: DC
  Filled 2022-12-01: qty 0.3, 1d supply, fill #0

## 2022-12-06 ENCOUNTER — Other Ambulatory Visit (HOSPITAL_COMMUNITY): Payer: Self-pay

## 2022-12-06 MED ORDER — OZEMPIC (2 MG/DOSE) 8 MG/3ML ~~LOC~~ SOPN
2.0000 mg | PEN_INJECTOR | SUBCUTANEOUS | 3 refills | Status: DC
Start: 2022-12-06 — End: 2023-04-12
  Filled 2022-12-06: qty 3, 28d supply, fill #0
  Filled 2023-01-06: qty 3, 28d supply, fill #1
  Filled 2023-02-08: qty 3, 28d supply, fill #2
  Filled 2023-03-07 – 2023-03-11 (×2): qty 3, 28d supply, fill #3

## 2022-12-07 ENCOUNTER — Other Ambulatory Visit (HOSPITAL_COMMUNITY): Payer: Self-pay

## 2022-12-22 ENCOUNTER — Ambulatory Visit (INDEPENDENT_AMBULATORY_CARE_PROVIDER_SITE_OTHER): Payer: Medicare HMO | Admitting: Family Medicine

## 2022-12-22 ENCOUNTER — Other Ambulatory Visit (HOSPITAL_COMMUNITY): Payer: Self-pay

## 2022-12-22 VITALS — BP 142/81 | HR 85 | Temp 98.7°F | Ht 62.0 in | Wt 225.0 lb

## 2022-12-22 DIAGNOSIS — E669 Obesity, unspecified: Secondary | ICD-10-CM | POA: Diagnosis not present

## 2022-12-22 DIAGNOSIS — E559 Vitamin D deficiency, unspecified: Secondary | ICD-10-CM | POA: Diagnosis not present

## 2022-12-22 DIAGNOSIS — Z7985 Long-term (current) use of injectable non-insulin antidiabetic drugs: Secondary | ICD-10-CM

## 2022-12-22 DIAGNOSIS — I10 Essential (primary) hypertension: Secondary | ICD-10-CM

## 2022-12-22 DIAGNOSIS — Z6841 Body Mass Index (BMI) 40.0 and over, adult: Secondary | ICD-10-CM | POA: Diagnosis not present

## 2022-12-22 DIAGNOSIS — E1169 Type 2 diabetes mellitus with other specified complication: Secondary | ICD-10-CM

## 2022-12-22 MED ORDER — VITAMIN D (ERGOCALCIFEROL) 1.25 MG (50000 UNIT) PO CAPS
50000.0000 [IU] | ORAL_CAPSULE | ORAL | 0 refills | Status: DC
Start: 2022-12-22 — End: 2023-03-30
  Filled 2022-12-22 – 2023-01-06 (×2): qty 12, 84d supply, fill #0

## 2022-12-22 NOTE — Progress Notes (Signed)
Chief Complaint:   OBESITY Rhonda Aguilar is here to discuss her progress with her obesity treatment plan along with follow-up of her obesity related diagnoses. Verbal is on keeping a food journal and adhering to recommended goals of 1200-1300 calories and 90 grams of protein and states she is following her eating plan approximately 45% of the time. Lorin states she is doing 0 minutes 0 times per week.  Today's visit was #: 75 Starting weight: 231 lbs Starting date: 12/08/2017 Today's weight: 225 lbs Today's date: 12/22/2022 Total lbs lost to date: 6 Total lbs lost since last in-office visit: 0  Interim History: Patient has done well with maintaining her weight.  She is struggling with meal planning.  She is working on getting back on track.  She will be traveling soon.  Subjective:   1. Vitamin D deficiency Patient's recent vitamin D level has improved but is not yet at goal.  2. Type 2 diabetes mellitus with other specified complication, without long-term current use of insulin ([name]) Patient recently increased Ozempic to 2 mg.  She notes her fasting blood sugars range between 140-160s.  She feels she did better on Mounjaro in the past, but her insurance will not cover Mounjaro.  3. Essential hypertension Patient's blood pressure is borderline elevated.  She feels this may be related to some increase in sodium.  Assessment/Plan:   1. Vitamin D deficiency We will refill once weekly prescription vitamin D 50,000 IU for 90 days.  - Vitamin D, Ergocalciferol, (DRISDOL) 1.25 MG (50000 UNIT) CAPS capsule; Take 1 capsule (50,000 Units total) by mouth once a week.  Dispense: 12 capsule; Refill: 0  2. Type 2 diabetes mellitus with other specified complication, without long-term current use of insulin ([name]) Patient will continue Ozempic at 2 mg and will try to change to [facility] after the first of the year.  She will continue with her diet and exercise.  3. Essential hypertension Patient  will continue to work on decreasing sodium and taking her medications as usual.  4. BMI 40.0-44.9, adult ([name])  5. Obesity, Beginning BMI 42.25 Rhonda Aguilar is currently in the action stage of change. As such, her goal is to continue with weight loss efforts. She has agreed to keeping a food journal and adhering to recommended goals of 1200-1300 calories and 90+ grams of protein daily.   Behavioral modification strategies: travel eating strategies and keeping a strict food journal.  Madysyn has agreed to follow-up with our clinic in 4 weeks. She was informed of the importance of frequent follow-up visits to maximize her success with intensive lifestyle modifications for her multiple health conditions.   Objective:   Blood pressure (!) 142/81, pulse 85, temperature 98.7 F (37.1 C), height 5\' 2"  (1.575 m), weight 225 lb (102.1 kg), SpO2 96%. Body mass index is 41.15 kg/m.  Lab Results  Component Value Date   CREATININE 0.75 11/26/2021   BUN 14 11/26/2021   NA 143 11/26/2021   K 4.0 11/26/2021   CL 103 11/26/2021   CO2 24 11/26/2021   Lab Results  Component Value Date   ALT 88 (H) 11/26/2021   AST 62 (H) 11/26/2021   ALKPHOS 79 11/26/2021   BILITOT 0.4 11/26/2021   Lab Results  Component Value Date   HGBA1C 6.4 (H) 11/26/2021   HGBA1C 6.7 (H) 02/09/2021   HGBA1C 6.2 (H) 04/08/2020   HGBA1C 7.0 (H) 08/22/2019   HGBA1C 6.7 (H) 04/19/2019   Lab Results  Component Value Date  INSULIN 37.9 (H) 11/26/2021   INSULIN 29.1 (H) 02/09/2021   INSULIN 43.2 (H) 04/08/2020   INSULIN 33.6 (H) 08/22/2019   INSULIN 40.0 (H) 04/19/2019   Lab Results  Component Value Date   TSH 2.550 12/08/2017   Lab Results  Component Value Date   CHOL 195 11/26/2021   HDL 44 11/26/2021   LDLCALC 111 (H) 11/26/2021   TRIG 228 (H) 11/26/2021   CHOLHDL 4.4 11/26/2021   Lab Results  Component Value Date   VD25OH 34.5 11/26/2021   VD25OH 42.3 02/09/2021   VD25OH 46.6 04/08/2020   Lab Results   Component Value Date   WBC 7.6 09/05/2019   HGB 15.0 09/05/2019   HCT 47.0 (H) 09/05/2019   MCV 85.0 09/05/2019   PLT 228 09/05/2019   No results found for: "IRON", "TIBC", "FERRITIN"  Attestation Statements:   Reviewed by clinician on day of visit: allergies, medications, problem list, medical history, surgical history, family history, social history, and previous encounter notes.  Time spent on visit including pre-visit chart review and post-visit care and charting was 40 minutes.   I, Burt Knack, am acting as transcriptionist for Quillian Quince, MD.  I have reviewed the above documentation for accuracy and completeness, and I agree with the above. -  Quillian Quince, MD

## 2022-12-23 ENCOUNTER — Other Ambulatory Visit (HOSPITAL_COMMUNITY): Payer: Self-pay

## 2023-01-05 ENCOUNTER — Other Ambulatory Visit (HOSPITAL_COMMUNITY): Payer: Self-pay

## 2023-01-05 MED ORDER — DEXLANSOPRAZOLE 60 MG PO CPDR
60.0000 mg | DELAYED_RELEASE_CAPSULE | Freq: Every day | ORAL | 0 refills | Status: DC
  Filled 2023-01-05: qty 90, 90d supply, fill #0

## 2023-01-06 ENCOUNTER — Other Ambulatory Visit (HOSPITAL_COMMUNITY): Payer: Self-pay

## 2023-01-06 ENCOUNTER — Other Ambulatory Visit: Payer: Self-pay

## 2023-01-06 LAB — BASIC METABOLIC PANEL WITH GFR
BUN: 19 (ref 4–21)
CO2: 27 — AB (ref 13–22)
Chloride: 104 (ref 99–108)
Creatinine: 0.8 (ref 0.5–1.1)
Glucose: 155
Potassium: 4 meq/L (ref 3.5–5.1)
Sodium: 139 (ref 137–147)

## 2023-01-06 LAB — CBC AND DIFFERENTIAL
HCT: 43 (ref 36–46)
Hemoglobin: 14.2 (ref 12.0–16.0)
Platelets: 242 K/uL (ref 150–400)
WBC: 7.8

## 2023-01-06 LAB — PROTEIN / CREATININE RATIO, URINE: Creatinine, Urine: 191

## 2023-01-06 LAB — LIPID PANEL
Cholesterol: 217 — AB (ref 0–200)
HDL: 47 (ref 35–70)
Triglycerides: 268 — AB (ref 40–160)

## 2023-01-06 LAB — COMPREHENSIVE METABOLIC PANEL WITH GFR
Albumin: 4.4 (ref 3.5–5.0)
Calcium: 9.5 (ref 8.7–10.7)
eGFR: 76

## 2023-01-06 LAB — CBC: RBC: 5.12 — AB (ref 3.87–5.11)

## 2023-01-06 LAB — MICROALBUMIN, URINE: Microalb, Ur: 2.92

## 2023-01-06 LAB — TSH: TSH: 2.15 (ref 0.41–5.90)

## 2023-01-06 LAB — HEPATIC FUNCTION PANEL
ALT: 77 U/L — AB (ref 7–35)
AST: 45 — AB (ref 13–35)
Alkaline Phosphatase: 80 (ref 25–125)
Bilirubin, Total: 0.5

## 2023-01-06 LAB — VITAMIN D 25 HYDROXY (VIT D DEFICIENCY, FRACTURES): Vit D, 25-Hydroxy: 39.6

## 2023-01-06 LAB — HEMOGLOBIN A1C: Hemoglobin A1C: 6.6

## 2023-01-06 MED ORDER — CHLORTHALIDONE 25 MG PO TABS
25.0000 mg | ORAL_TABLET | Freq: Every morning | ORAL | 3 refills | Status: AC
  Filled 2023-01-06: qty 90, 90d supply, fill #0
  Filled 2023-09-27: qty 90, 90d supply, fill #1

## 2023-01-06 MED ORDER — VERAPAMIL HCL ER 300 MG PO CP24
300.0000 mg | ORAL_CAPSULE | Freq: Every day | ORAL | 3 refills | Status: DC
  Filled 2023-01-06 – 2023-05-20 (×2): qty 90, 90d supply, fill #0
  Filled 2023-08-16: qty 90, 90d supply, fill #1
  Filled 2023-11-14: qty 90, 90d supply, fill #2

## 2023-01-06 MED ORDER — VALSARTAN 320 MG PO TABS
320.0000 mg | ORAL_TABLET | Freq: Every day | ORAL | 3 refills | Status: DC
  Filled 2023-01-06 – 2023-03-24 (×2): qty 90, 90d supply, fill #0
  Filled 2023-06-28: qty 90, 90d supply, fill #1
  Filled 2023-09-27: qty 90, 90d supply, fill #2
  Filled 2024-01-02: qty 90, 90d supply, fill #3

## 2023-01-06 MED ORDER — DEXLANSOPRAZOLE 60 MG PO CPDR
60.0000 mg | DELAYED_RELEASE_CAPSULE | Freq: Every day | ORAL | 0 refills | Status: DC
  Filled 2023-01-06: qty 90, 90d supply, fill #0

## 2023-01-07 ENCOUNTER — Other Ambulatory Visit (HOSPITAL_COMMUNITY): Payer: Self-pay

## 2023-01-07 DIAGNOSIS — G4730 Sleep apnea, unspecified: Secondary | ICD-10-CM | POA: Diagnosis not present

## 2023-01-07 DIAGNOSIS — G4733 Obstructive sleep apnea (adult) (pediatric): Secondary | ICD-10-CM | POA: Diagnosis not present

## 2023-01-10 ENCOUNTER — Other Ambulatory Visit: Payer: Self-pay | Admitting: Family Medicine

## 2023-01-10 DIAGNOSIS — E2839 Other primary ovarian failure: Secondary | ICD-10-CM

## 2023-01-13 ENCOUNTER — Ambulatory Visit (INDEPENDENT_AMBULATORY_CARE_PROVIDER_SITE_OTHER): Payer: Medicare HMO | Admitting: Internal Medicine

## 2023-01-13 ENCOUNTER — Encounter (INDEPENDENT_AMBULATORY_CARE_PROVIDER_SITE_OTHER): Payer: Self-pay | Admitting: Internal Medicine

## 2023-01-13 VITALS — BP 134/81 | HR 86 | Temp 97.5°F | Ht 62.0 in | Wt 224.0 lb

## 2023-01-13 DIAGNOSIS — K760 Fatty (change of) liver, not elsewhere classified: Secondary | ICD-10-CM | POA: Diagnosis not present

## 2023-01-13 DIAGNOSIS — E1169 Type 2 diabetes mellitus with other specified complication: Secondary | ICD-10-CM | POA: Diagnosis not present

## 2023-01-13 DIAGNOSIS — Z7985 Long-term (current) use of injectable non-insulin antidiabetic drugs: Secondary | ICD-10-CM

## 2023-01-13 DIAGNOSIS — E66813 Obesity, class 3: Secondary | ICD-10-CM | POA: Diagnosis not present

## 2023-01-13 DIAGNOSIS — Z6841 Body Mass Index (BMI) 40.0 and over, adult: Secondary | ICD-10-CM | POA: Diagnosis not present

## 2023-01-13 NOTE — Assessment & Plan Note (Signed)
HgbA1c is at goal for age and comorbid conditions. Denies symptoms of hypoglycemia or hyperglycemia. On semaglutide 2.0 mg once a week with good adherence and reports some nausea and bloating.  Counseled on goals of care, monitoring for complications and importance of staying updated on immunizations and diabetes preventive measures. Continue with reduced calorie meal plan low on processed crabs and simple sugars. Ongoing weight loss will improve insulin resistance and glycemic control  Lab Results  Component Value Date   HGBA1C 6.4 (H) 11/26/2021   HGBA1C 6.7 (H) 02/09/2021   HGBA1C 6.2 (H) 04/08/2020   Lab Results  Component Value Date   LDLCALC 111 (H) 11/26/2021   CREATININE 0.75 11/26/2021   We discussed limitations of GLP-1 therapy.  I do not think switching her to a different GLP-1 would be of benefit.  I will like for her to focus more on shifting macros, reducing simple and added sugars and increasing physical activity levels.

## 2023-01-13 NOTE — Progress Notes (Deleted)
Office: [phone]  /  Fax: [phone]  Weight Summary And Biometrics  No data recorded No data recorded No data recorded  No data recorded No data recorded No data recorded  Subjective   Chief Complaint: Obesity  Rhonda Aguilar is here to discuss her progress with her obesity treatment plan. She is on the {MWMwtlossportion/plan2:23431} and states she is following her eating plan approximately *** % of the time. She states she is exercising *** minutes *** times per week.  Interval History:   Since last office visit she has {emweight change:30888}. She reports {EMADHERENCE:28838::"good adherence to reduced calorie nutritional plan."} She has been working on [employer] labels","not skipping meals","increasing protein intake at every meal","drinking more water","making healthier choices","reducing portion sizes","incorporating more whole foods"}  Orexigenic Control:  {ACTIONS;DENIES/REPORTS:21021675::"Denies"} problems with appetite and hunger signals.  {ACTIONS;DENIES/REPORTS:21021675::"Denies"} problems with satiety and satiation.  {ACTIONS;DENIES/REPORTS:21021675::"Denies"} problems with eating patterns and portion control.  {ACTIONS;DENIES/REPORTS:21021675::"Denies"} abnormal cravings. {ACTIONS;DENIES/REPORTS:21021675::"Denies"} feeling deprived or restricted.   Barriers identified: {EMOBESITYBARRIERS:28841::"none"}.   Pharmacotherapy for weight loss: She is currently taking {EMPharmaco:28845}.   Assessment and Plan   Treatment Plan For Obesity:  Recommended Dietary Goals  Rhonda Aguilar is currently in the action stage of change. As such, her goal is to continue weight management plan. She has agreed to: {EMWTLOSSPLAN:29297::"continue current plan"}  Behavioral Intervention  We discussed the following Behavioral Modification Strategies today: {EMWMwtlossstrategies:28914::"continue to work on maintaining a reduced calorie state, getting the recommended  amount of protein, incorporating whole foods, making healthy choices, staying well hydrated and practicing mindfulness when eating."}.  Additional resources provided today: {EMadditionalresources:29169::"None"}  Recommended Physical Activity Goals  Rhonda Aguilar has been advised to work up to 150 minutes of moderate intensity aerobic activity a week and strengthening exercises 2-3 times per week for cardiovascular health, weight loss maintenance and preservation of muscle mass.   She has agreed to :  {EMEXERCISE:28847::"Think about enjoyable ways to increase daily physical activity and overcoming barriers to exercise","Increase physical activity in their day and reduce sedentary time (increase NEAT)."}  Pharmacotherapy  We discussed various medication options to help Rhonda Aguilar with her weight loss efforts and we both agreed to : {EMagreedrx:29170::"continue with nutritional and behavioral strategies"}  Associated Conditions Addressed Today  There are no diagnoses linked to this encounter.  Objective   Physical Exam:  There were no vitals taken for this visit. There is no height or weight on file to calculate BMI.  General: She is overweight, cooperative, alert, well developed, and in no acute distress. PSYCH: Has normal mood, affect and thought process.   HEENT: EOMI, sclerae are anicteric. Lungs: Normal breathing effort, no conversational dyspnea. Extremities: No edema.  Neurologic: No gross sensory or motor deficits. No tremors or fasciculations noted.    Diagnostic Data Reviewed:  BMET    Component Value Date/Time   NA 143 11/26/2021 0919   K 4.0 11/26/2021 0919   CL 103 11/26/2021 0919   CO2 24 11/26/2021 0919   GLUCOSE 118 (H) 11/26/2021 0919   GLUCOSE 192 (H) 09/05/2019 1030   BUN 14 11/26/2021 0919   CREATININE 0.75 11/26/2021 0919   CALCIUM 9.4 11/26/2021 0919   GFRNONAA 71 04/08/2020 1012   GFRAA 81 04/08/2020 1012   Lab Results  Component Value Date   HGBA1C 6.4 (H)  11/26/2021   HGBA1C 6.7 (H) 04/19/2019   Lab Results  Component Value Date   INSULIN 37.9 (H) 11/26/2021   INSULIN 38.2 (H) 12/08/2017   Lab Results  Component Value Date   TSH 2.550  12/08/2017   CBC    Component Value Date/Time   WBC 7.6 09/05/2019 1030   RBC 5.53 (H) 09/05/2019 1030   HGB 15.0 09/05/2019 1030   HGB 12.9 12/08/2017 1306   HCT 47.0 (H) 09/05/2019 1030   HCT 38.1 12/08/2017 1306   PLT 228 09/05/2019 1030   MCV 85.0 09/05/2019 1030   MCV 82 12/08/2017 1306   MCH 27.1 09/05/2019 1030   MCHC 31.9 09/05/2019 1030   RDW 15.4 09/05/2019 1030   RDW 14.0 12/08/2017 1306   Iron Studies No results found for: "IRON", "TIBC", "FERRITIN", "IRONPCTSAT" Lipid Panel     Component Value Date/Time   CHOL 195 11/26/2021 0919   TRIG 228 (H) 11/26/2021 0919   HDL 44 11/26/2021 0919   CHOLHDL 4.4 11/26/2021 0919   LDLCALC 111 (H) 11/26/2021 0919   Hepatic Function Panel     Component Value Date/Time   PROT 6.5 11/26/2021 0919   ALBUMIN 4.6 11/26/2021 0919   AST 62 (H) 11/26/2021 0919   ALT 88 (H) 11/26/2021 0919   ALKPHOS 79 11/26/2021 0919   BILITOT 0.4 11/26/2021 0919      Component Value Date/Time   TSH 2.550 12/08/2017 1306   Nutritional Lab Results  Component Value Date   VD25OH 34.5 11/26/2021   VD25OH 42.3 02/09/2021   VD25OH 46.6 04/08/2020    Follow-Up   No follow-ups on file.[name] She was informed of the importance of frequent follow up visits to maximize her success with intensive lifestyle modifications for her multiple health conditions.  Attestation Statement   Reviewed by clinician on day of visit: allergies, medications, problem list, medical history, surgical history, family history, social history, and previous encounter notes.     Worthy Rancher, MD

## 2023-01-13 NOTE — Assessment & Plan Note (Signed)
That is my first encounter with patient she has been in our program since 2019 but has struggled to lose weight.  She did share with me that she did well on an Atkins diet which is a low-carb diet.  I do think that she is sensitive to the carb insulin model as she has hyperinsulinemia and type 2 diabetes.  We therefore recommend increasing protein intake reducing simple and added sugars in her diet and increasing fruits and vegetables using the balanced plate.  She was provided with education on complex carbs glycemic index and load.  She will work on microdosing vegetables as she has an intolerance to these due to the infrequency of intake.  She is also seems to look forward to starting an aqua aerobics program she did acknowledge disliking exercise.

## 2023-01-13 NOTE — Assessment & Plan Note (Signed)
I reviewed most recent labs and previous imaging studies she does have hepatic steatosis and elevated liver enzymes consistent with NASH.  I recommend further evaluation if not completed in the past.  Reducing saturated fats, simple and added sugars avoiding alcohol and losing 15% of body weight may improve condition.  Treatment with semaglutide is also beneficial.

## 2023-01-13 NOTE — Progress Notes (Signed)
Office: [phone]  /  Fax: [phone]  Weight Summary And Biometrics  Vitals Temp: (!) 97.5 F (36.4 C) BP: 134/81 Pulse Rate: 86 SpO2: 96 %   Anthropometric Measurements Height: 5\' 2"  (1.575 m) Weight: 224 lb (101.6 kg) BMI (Calculated): 40.96 Weight at Last Visit: 225lb Weight Lost Since Last Visit: 1 lb Weight Gained Since Last Visit: 0 Starting Weight: 231 lb Total Weight Loss (lbs): 7 lb (3.175 kg) Peak Weight: 235 lb   Body Composition  Body Fat %: 48.5 % Fat Mass (lbs): 108.8 lbs Muscle Mass (lbs): 109.8 lbs Total Body Water (lbs): 77.4 lbs Visceral Fat Rating : 17    No data recorded Today's Visit #: 76  No data recorded  Subjective   Chief Complaint: Obesity  Rhonda Aguilar is here to discuss her progress with her obesity treatment plan. She is on the the Category 2 Plan and states she is following her eating plan approximately 20 % of the time. She states she is exercising was walking on her trip 2,000-5,000 steps daily, water aerobics one day, walking 3 times per week.  Interval History:   Discussed the use of AI scribe software for clinical note transcription with the patient, who gave verbal consent to proceed.  History of Present Illness   This is my first encounter with Rhonda Aguilar.  She is a pleasant 67 year old female who is affected by obesity the patient, a known case of type 2 diabetes and hypertension, has been under our care since 2019. The patient reports a weight loss of 6 pounds since joining our program, with a 1-pound loss since the last visit. However, the patient acknowledges a plateau in weight loss, attributing it to personal difficulties in adhering to the prescribed diet and exercise regimen.  The patient has been on a 1200 calorie diet with 90 grams of protein, along with Ozempic for diabetes management. Despite this, the patient reports minimal weight loss and struggles with side effects, including nausea. The patient's fasting  glucose levels have been elevated, leading to an increase in Ozempic dosage . The patient's recent HbA1c was 6.6, a slight increase from 6.4 in May.  The patient also has a history of sleep apnea and uses a continuous positive airway pressure (CPAP) machine regularly. The patient reports a dislike for exercise but acknowledges the need for it. The patient recently retired and plans to start water aerobics, which she has found beneficial in the past.  The patient's weight loss journey has been marked by fluctuations, with a notable loss of 40 pounds over six months on an Atkins diet however, the weight was regained after discontinuing the diet. The patient expresses frustration with the lack of progress despite medication and dietary changes. The patient also reports difficulty in tolerating high-protein foods due to the side effects of Ozempic.  The patient has a history of gallbladder removal three years ago, which she reports has affected her digestion. The patient has been trying to incorporate more fruits and vegetables into her diet but struggles with gastrointestinal discomfort. The patient is considering changes to her diet, aiming for a Mediterranean style diet with a balance of complex carbohydrates, proteins, and healthy fats. The patient acknowledges the need for gradual changes and is hopeful about the potential for weight loss and improved health.      Orexigenic Control:  Denies problems with appetite and hunger signals.  Denies problems with satiety and satiation.  Reports problems with eating patterns and portion control.  Reports abnormal  cravings. Reports feeling deprived or restricted.   Barriers identified: strong hunger signals and impaired satiety / inhibitory control, having difficulty focusing on healthy eating, low volume of physical activity at present , medical comorbidities, difficulty maintaining a reduced calorie state, sleep apnea, and menopause.   Pharmacotherapy  for weight loss: She is currently taking Ozempic with diabetes as the primary indication with adequate clinical response  and without side effects..   Assessment and Plan   Treatment Plan For Obesity:  Recommended Dietary Goals  Rhonda Aguilar is currently in the action stage of change. As such, her goal is to continue weight management plan. She has agreed to: continue current plan and patient was educated on the carb insulin model.  She is sensitive to simple and processed carbs as she has a degree of hyperinsulinemia this is likely why she was successful when she was doing Atkins.  I recommend that she begin a low-carb high-protein plan shooting for 30% of calories from protein 40% from complex carbs and 30% from healthy fats.  We reviewed a balanced plate with patient.  We also discussed microdosing vegetables as I do not think she has the microbiome to tolerate increasing vegetables from baseline.  This is likely the reason why she has intolerance to fruits and vegetables.  Behavioral Intervention  We discussed the following Behavioral Modification Strategies today: continue to work on maintaining a reduced calorie state, getting the recommended amount of protein, incorporating whole foods, making healthy choices, staying well hydrated and practicing mindfulness when eating..  Additional resources provided today: Handout on healthy eating and balanced plate  Recommended Physical Activity Goals  Rhonda Aguilar has been advised to work up to 150 minutes of moderate intensity aerobic activity a week and strengthening exercises 2-3 times per week for cardiovascular health, weight loss maintenance and preservation of muscle mass.   She has agreed to :  Think about enjoyable ways to increase daily physical activity and overcoming barriers to exercise and Increase physical activity in their day and reduce sedentary time (increase NEAT). She is thinking of starting aqua aerobics which I highly  recommend. Pharmacotherapy  We discussed various medication options to help Rhonda Aguilar with her weight loss efforts and we both agreed to : continue current anti-obesity medication regimen and I do not think this patient will respond favorably to changes in GLP-1.  She benefits more from adjusting her macros and reducing simple and added sugars in her diet.  Also increasing physical activity levels.  Associated Conditions Addressed Today   Metabolic dysfunction-associated steatotic liver disease (MASLD) Assessment & Plan: I reviewed most recent labs and previous imaging studies she does have hepatic steatosis and elevated liver enzymes consistent with NASH.  I recommend further evaluation if not completed in the past.  Reducing saturated fats, simple and added sugars avoiding alcohol and losing 15% of body weight may improve condition.  Treatment with semaglutide is also beneficial.     Type 2 diabetes mellitus with other specified complication, without long-term current use of insulin ([name]) Assessment & Plan: HgbA1c is at goal for age and comorbid conditions. Denies symptoms of hypoglycemia or hyperglycemia. On semaglutide 2.0 mg once a week with good adherence and reports some nausea and bloating.  Counseled on goals of care, monitoring for complications and importance of staying updated on immunizations and diabetes preventive measures. Continue with reduced calorie meal plan low on processed crabs and simple sugars. Ongoing weight loss will improve insulin resistance and glycemic control  Lab Results  Component Value  Date   HGBA1C 6.4 (H) 11/26/2021   HGBA1C 6.7 (H) 02/09/2021   HGBA1C 6.2 (H) 04/08/2020   Lab Results  Component Value Date   LDLCALC 111 (H) 11/26/2021   CREATININE 0.75 11/26/2021   We discussed limitations of GLP-1 therapy.  I do not think switching her to a different GLP-1 would be of benefit.  I will like for her to focus more on shifting macros, reducing simple and  added sugars and increasing physical activity levels.    Class 3 severe obesity with serious comorbidity and body mass index (BMI) of 40.0 to 44.9 in adult, unspecified obesity type Rhonda Aguilar) Assessment & Plan: That is my first encounter with patient she has been in our program since 2019 but has struggled to lose weight.  She did share with me that she did well on an Atkins diet which is a low-carb diet.  I do think that she is sensitive to the carb insulin model as she has hyperinsulinemia and type 2 diabetes.  We therefore recommend increasing protein intake reducing simple and added sugars in her diet and increasing fruits and vegetables using the balanced plate.  She was provided with education on complex carbs glycemic index and load.  She will work on microdosing vegetables as she has an intolerance to these due to the infrequency of intake.  She is also seems to look forward to starting an aqua aerobics program she did acknowledge disliking exercise.            Objective   Physical Exam:  Blood pressure 134/81, pulse 86, temperature (!) 97.5 F (36.4 C), height 5\' 2"  (1.575 m), weight 224 lb (101.6 kg), SpO2 96%. Body mass index is 40.97 kg/m.  General: She is overweight, cooperative, alert, well developed, and in no acute distress. PSYCH: Has normal mood, affect and thought process.   HEENT: EOMI, sclerae are anicteric. Lungs: Normal breathing effort, no conversational dyspnea. Extremities: No edema.  Neurologic: No gross sensory or motor deficits. No tremors or fasciculations noted.    Diagnostic Data Reviewed:  BMET    Component Value Date/Time   NA 143 11/26/2021 0919   K 4.0 11/26/2021 0919   CL 103 11/26/2021 0919   CO2 24 11/26/2021 0919   GLUCOSE 118 (H) 11/26/2021 0919   GLUCOSE 192 (H) 09/05/2019 1030   BUN 14 11/26/2021 0919   CREATININE 0.75 11/26/2021 0919   CALCIUM 9.4 11/26/2021 0919   GFRNONAA 71 04/08/2020 1012   GFRAA 81 04/08/2020 1012   Lab  Results  Component Value Date   HGBA1C 6.4 (H) 11/26/2021   HGBA1C 6.7 (H) 04/19/2019   Lab Results  Component Value Date   INSULIN 37.9 (H) 11/26/2021   INSULIN 38.2 (H) 12/08/2017   Lab Results  Component Value Date   TSH 2.550 12/08/2017   CBC    Component Value Date/Time   WBC 7.6 09/05/2019 1030   RBC 5.53 (H) 09/05/2019 1030   HGB 15.0 09/05/2019 1030   HGB 12.9 12/08/2017 1306   HCT 47.0 (H) 09/05/2019 1030   HCT 38.1 12/08/2017 1306   PLT 228 09/05/2019 1030   MCV 85.0 09/05/2019 1030   MCV 82 12/08/2017 1306   MCH 27.1 09/05/2019 1030   MCHC 31.9 09/05/2019 1030   RDW 15.4 09/05/2019 1030   RDW 14.0 12/08/2017 1306   Iron Studies No results found for: "IRON", "TIBC", "FERRITIN", "IRONPCTSAT" Lipid Panel     Component Value Date/Time   CHOL 195 11/26/2021 0919  TRIG 228 (H) 11/26/2021 0919   HDL 44 11/26/2021 0919   CHOLHDL 4.4 11/26/2021 0919   LDLCALC 111 (H) 11/26/2021 0919   Hepatic Function Panel     Component Value Date/Time   PROT 6.5 11/26/2021 0919   ALBUMIN 4.6 11/26/2021 0919   AST 62 (H) 11/26/2021 0919   ALT 88 (H) 11/26/2021 0919   ALKPHOS 79 11/26/2021 0919   BILITOT 0.4 11/26/2021 0919      Component Value Date/Time   TSH 2.550 12/08/2017 1306   Nutritional Lab Results  Component Value Date   VD25OH 34.5 11/26/2021   VD25OH 42.3 02/09/2021   VD25OH 46.6 04/08/2020    Follow-Up   Return in about 4 weeks (around 02/10/2023) for Dr. Dalbert Garnet.[name] She was informed of the importance of frequent follow up visits to maximize her success with intensive lifestyle modifications for her multiple health conditions.  Attestation Statement   Reviewed by clinician on day of visit: allergies, medications, problem list, medical history, surgical history, family history, social history, and previous encounter notes.     Worthy Rancher, MD

## 2023-01-31 ENCOUNTER — Other Ambulatory Visit (HOSPITAL_COMMUNITY): Payer: Self-pay

## 2023-02-01 ENCOUNTER — Other Ambulatory Visit (HOSPITAL_COMMUNITY): Payer: Self-pay

## 2023-02-01 MED ORDER — CLOBETASOL PROPIONATE 0.05 % EX OINT
TOPICAL_OINTMENT | Freq: Two times a day (BID) | CUTANEOUS | 0 refills | Status: DC | PRN
  Filled 2023-02-01: qty 15, 15d supply, fill #0

## 2023-02-06 DIAGNOSIS — G4730 Sleep apnea, unspecified: Secondary | ICD-10-CM | POA: Diagnosis not present

## 2023-02-06 DIAGNOSIS — G4733 Obstructive sleep apnea (adult) (pediatric): Secondary | ICD-10-CM | POA: Diagnosis not present

## 2023-02-08 ENCOUNTER — Other Ambulatory Visit: Payer: Self-pay

## 2023-02-08 ENCOUNTER — Other Ambulatory Visit (HOSPITAL_COMMUNITY): Payer: Self-pay

## 2023-02-09 ENCOUNTER — Other Ambulatory Visit (HOSPITAL_COMMUNITY): Payer: Self-pay

## 2023-02-21 ENCOUNTER — Encounter (INDEPENDENT_AMBULATORY_CARE_PROVIDER_SITE_OTHER): Payer: Self-pay | Admitting: Family Medicine

## 2023-02-21 ENCOUNTER — Ambulatory Visit (INDEPENDENT_AMBULATORY_CARE_PROVIDER_SITE_OTHER): Payer: Medicare HMO | Admitting: Family Medicine

## 2023-02-21 VITALS — BP 137/81 | HR 77 | Temp 98.1°F | Ht 62.0 in | Wt 227.0 lb

## 2023-02-21 DIAGNOSIS — F419 Anxiety disorder, unspecified: Secondary | ICD-10-CM | POA: Insufficient documentation

## 2023-02-21 DIAGNOSIS — F338 Other recurrent depressive disorders: Secondary | ICD-10-CM | POA: Diagnosis not present

## 2023-02-21 DIAGNOSIS — E119 Type 2 diabetes mellitus without complications: Secondary | ICD-10-CM

## 2023-02-21 DIAGNOSIS — Z7985 Long-term (current) use of injectable non-insulin antidiabetic drugs: Secondary | ICD-10-CM | POA: Diagnosis not present

## 2023-02-21 DIAGNOSIS — Z6841 Body Mass Index (BMI) 40.0 and over, adult: Secondary | ICD-10-CM | POA: Diagnosis not present

## 2023-02-21 DIAGNOSIS — E669 Obesity, unspecified: Secondary | ICD-10-CM | POA: Diagnosis not present

## 2023-02-21 DIAGNOSIS — E1169 Type 2 diabetes mellitus with other specified complication: Secondary | ICD-10-CM

## 2023-02-21 DIAGNOSIS — I10 Essential (primary) hypertension: Secondary | ICD-10-CM

## 2023-02-21 NOTE — Progress Notes (Signed)
.smr  Office: [phone]  /  Fax: [phone]  WEIGHT SUMMARY AND BIOMETRICS  Anthropometric Measurements Height: 5\' 2"  (1.575 m) Weight: 227 lb (103 kg) BMI (Calculated): 41.51 Weight at Last Visit: 224 lb Weight Lost Since Last Visit: 0 Weight Gained Since Last Visit: 3 lb Starting Weight: 231 lb Total Weight Loss (lbs): 4 lb (1.814 kg) Peak Weight: 235 lb   Body Composition  Body Fat %: 50.8 % Fat Mass (lbs): 115.6 lbs Muscle Mass (lbs): 106.4 lbs Total Body Water (lbs): 81.6 lbs Visceral Fat Rating : 18   Other Clinical Data Fasting: No Labs: No Today's Visit #: 8 Starting Date: 12/08/17    Chief Complaint: OBESITY   History of Present Illness   The patient, with a history of hypertension, type two diabetes, and obesity, presents for a routine follow-up. She reports a weight gain of three pounds over the past six weeks, which includes the holiday season. Despite efforts to maintain a diet with a calorie goal of 1200 and a protein goal of 90 or more, she admits to achieving these goals only about 30% of the time. She is currently not engaging in any exercise. Her hypertension is well-controlled on chlorthalidone, valsartan, and verapamil, with a blood pressure of 137/81 at this visit. She is also on Ozempic for diabetes management.  The patient has been experiencing sleep disturbances, often going to bed late and waking up late. She has reduced her dose of Buspar to 2.5mg , suspecting that her reduced stress levels since retiring might be contributing to her sleep issues. She reports some improvement in energy levels since the dose reduction. However, she also reports bouts of nausea and dizziness, which she attributes to the Ozempic.  The patient also has a history of eczema, which has been flaring up recently. She has been dealing with some household issues, including a wiring problem causing a lack of lighting in her bedroom. She expresses feelings of being  overwhelmed by the state of her house, which she describes as being in disarray for the past year.  The patient has been experiencing some emotional distress, including symptoms of seasonal affective disorder and recent grief over the loss of a pet. She also expresses feelings of empathy-induced stress when dealing with the problems of others. She has been considering getting a housekeeper to help manage her household chores.  The patient's diet has been inconsistent, with periods of not feeling hungry and eating whatever is available in the house. She has been trying to focus on protein intake, often resorting to protein shakes when she feels her intake is low. She reports some nausea after eating certain foods, such as deviled eggs. She is considering switching to [facility] for her diabetes management, hoping it might help with weight loss.  The patient acknowledges the need for more structure in her life, including regular sleep patterns and exercise. She expresses a desire to feel better and improve her health. She is considering rejoining a water exercise class and possibly adding yoga to her routine in the future. She also expresses a desire to get her house in order so she can focus on other things.          PHYSICAL EXAM:  Blood pressure 137/81, pulse 77, temperature 98.1 F (36.7 C), height 5\' 2"  (1.575 m), weight 227 lb (103 kg), SpO2 96%. Body mass index is 41.52 kg/m.  DIAGNOSTIC DATA REVIEWED:  BMET    Component Value Date/Time   NA 143 11/26/2021 0919  K 4.0 11/26/2021 0919   CL 103 11/26/2021 0919   CO2 24 11/26/2021 0919   GLUCOSE 118 (H) 11/26/2021 0919   GLUCOSE 192 (H) 09/05/2019 1030   BUN 14 11/26/2021 0919   CREATININE 0.75 11/26/2021 0919   CALCIUM 9.4 11/26/2021 0919   GFRNONAA 71 04/08/2020 1012   GFRAA 81 04/08/2020 1012   Lab Results  Component Value Date   HGBA1C 6.4 (H) 11/26/2021   HGBA1C 6.7 (H) 04/19/2019   Lab Results  Component Value Date    INSULIN 37.9 (H) 11/26/2021   INSULIN 38.2 (H) 12/08/2017   Lab Results  Component Value Date   TSH 2.550 12/08/2017   CBC    Component Value Date/Time   WBC 7.6 09/05/2019 1030   RBC 5.53 (H) 09/05/2019 1030   HGB 15.0 09/05/2019 1030   HGB 12.9 12/08/2017 1306   HCT 47.0 (H) 09/05/2019 1030   HCT 38.1 12/08/2017 1306   PLT 228 09/05/2019 1030   MCV 85.0 09/05/2019 1030   MCV 82 12/08/2017 1306   MCH 27.1 09/05/2019 1030   MCHC 31.9 09/05/2019 1030   RDW 15.4 09/05/2019 1030   RDW 14.0 12/08/2017 1306   Iron Studies No results found for: "IRON", "TIBC", "FERRITIN", "IRONPCTSAT" Lipid Panel     Component Value Date/Time   CHOL 195 11/26/2021 0919   TRIG 228 (H) 11/26/2021 0919   HDL 44 11/26/2021 0919   CHOLHDL 4.4 11/26/2021 0919   LDLCALC 111 (H) 11/26/2021 0919   Hepatic Function Panel     Component Value Date/Time   PROT 6.5 11/26/2021 0919   ALBUMIN 4.6 11/26/2021 0919   AST 62 (H) 11/26/2021 0919   ALT 88 (H) 11/26/2021 0919   ALKPHOS 79 11/26/2021 0919   BILITOT 0.4 11/26/2021 0919      Component Value Date/Time   TSH 2.550 12/08/2017 1306   Nutritional Lab Results  Component Value Date   VD25OH 34.5 11/26/2021   VD25OH 42.3 02/09/2021   VD25OH 46.6 04/08/2020     Assessment and Plan    Type 2 Diabetes Mellitus Managed with Ozempic. Reports occasional nausea. Discussed potential switch to [facility] for better weight loss outcomes if covered by insurance. Greggory Keen has shown a 15-20% weight loss in clinical trials, which may improve glycemic control and reduce cardiovascular risk. - Continue Ozempic - Monitor blood glucose levels regularly - Encourage dietary modifications with a focus on protein intake - Consider switching to [facility] if covered by insurance  Obesity Gained three pounds since last visit six weeks ago. Meeting dietary goals only 30% of the time. Not currently exercising. Discussed benefits of regular exercise and dietary  modifications. Explained that even modest weight loss of 5-10% can significantly improve metabolic health. - Encourage regular exercise - Continue journaling dietary intake with a goal of 1200 calories and 90 grams of protein - Consider referral to a dietitian for additional support - Discuss potential benefits of Mounjaro for weight loss if covered by insurance  Hypertension Well controlled with a blood pressure reading of 137/81 mmHg. On chlorthalidone, valsartan, and verapamil. Discussed the importance of regular exercise and dietary modifications. Well-controlled hypertension reduces the risk of cardiovascular events by approximately 20-25%. - Continue chlorthalidone, valsartan, and verapamil - Encourage regular exercise - Continue dietary modifications  Seasonal Affective Disorder and Anxiety Reports lack of activity and motivation. Adjusted Buspar dosage to 2.5 mg, which has helped with energy levels but not with sleep issues. Discussed benefits of light therapy and regular social interactions.  Light therapy can improve symptoms in 60-70% of patients. - Continue Buspar at 2.5 mg - Encourage regular sleep schedule - Consider light therapy - Encourage regular (not excessive) social interactions   General Health Maintenance Working on improving overall health through diet, exercise, and managing chronic conditions. Considering hiring a housekeeper to reduce stress and improve mental health. - Encourage regular exercise - Continue dietary modifications - Consider hiring a housekeeper to reduce stress - Encourage regular social interactions  Follow-up - Follow up with healthcare provider as scheduled - Monitor blood pressure and blood glucose levels regularly - Discuss potential switch to [facility] with healthcare provider.        She was informed of the importance of frequent follow up visits to maximize her success with intensive lifestyle modifications for her multiple health  conditions.    Quillian Quince, MD

## 2023-03-07 ENCOUNTER — Other Ambulatory Visit (HOSPITAL_COMMUNITY): Payer: Self-pay

## 2023-03-11 ENCOUNTER — Other Ambulatory Visit (HOSPITAL_COMMUNITY): Payer: Self-pay

## 2023-03-12 ENCOUNTER — Other Ambulatory Visit (HOSPITAL_COMMUNITY): Payer: Self-pay

## 2023-03-14 ENCOUNTER — Other Ambulatory Visit (HOSPITAL_COMMUNITY): Payer: Self-pay

## 2023-03-17 ENCOUNTER — Other Ambulatory Visit (HOSPITAL_COMMUNITY): Payer: Self-pay

## 2023-03-17 DIAGNOSIS — Z860100 Personal history of colon polyps, unspecified: Secondary | ICD-10-CM | POA: Diagnosis not present

## 2023-03-17 DIAGNOSIS — K219 Gastro-esophageal reflux disease without esophagitis: Secondary | ICD-10-CM | POA: Diagnosis not present

## 2023-03-17 DIAGNOSIS — K5901 Slow transit constipation: Secondary | ICD-10-CM | POA: Diagnosis not present

## 2023-03-17 MED ORDER — DEXLANSOPRAZOLE 60 MG PO CPDR
60.0000 mg | DELAYED_RELEASE_CAPSULE | Freq: Every day | ORAL | 4 refills | Status: DC
  Filled 2023-03-17 – 2023-08-16 (×2): qty 30, 30d supply, fill #0
  Filled 2023-09-13: qty 30, 30d supply, fill #1
  Filled 2023-10-28: qty 30, 30d supply, fill #2
  Filled 2023-12-05: qty 30, 30d supply, fill #3
  Filled 2024-02-10: qty 30, 30d supply, fill #4

## 2023-03-24 ENCOUNTER — Other Ambulatory Visit: Payer: Self-pay

## 2023-03-28 ENCOUNTER — Other Ambulatory Visit (HOSPITAL_COMMUNITY): Payer: Self-pay

## 2023-03-30 ENCOUNTER — Ambulatory Visit (INDEPENDENT_AMBULATORY_CARE_PROVIDER_SITE_OTHER): Payer: Medicare Other | Admitting: Family Medicine

## 2023-03-30 ENCOUNTER — Encounter (INDEPENDENT_AMBULATORY_CARE_PROVIDER_SITE_OTHER): Payer: Self-pay | Admitting: Family Medicine

## 2023-03-30 ENCOUNTER — Other Ambulatory Visit (HOSPITAL_COMMUNITY): Payer: Self-pay

## 2023-03-30 VITALS — BP 127/71 | HR 76 | Temp 97.7°F | Ht 62.0 in | Wt 224.0 lb

## 2023-03-30 DIAGNOSIS — I10 Essential (primary) hypertension: Secondary | ICD-10-CM | POA: Diagnosis not present

## 2023-03-30 DIAGNOSIS — G43109 Migraine with aura, not intractable, without status migrainosus: Secondary | ICD-10-CM

## 2023-03-30 DIAGNOSIS — G43909 Migraine, unspecified, not intractable, without status migrainosus: Secondary | ICD-10-CM

## 2023-03-30 DIAGNOSIS — E669 Obesity, unspecified: Secondary | ICD-10-CM

## 2023-03-30 DIAGNOSIS — Z6841 Body Mass Index (BMI) 40.0 and over, adult: Secondary | ICD-10-CM

## 2023-03-30 DIAGNOSIS — F338 Other recurrent depressive disorders: Secondary | ICD-10-CM | POA: Diagnosis not present

## 2023-03-30 DIAGNOSIS — E559 Vitamin D deficiency, unspecified: Secondary | ICD-10-CM

## 2023-03-30 MED ORDER — VITAMIN D (ERGOCALCIFEROL) 1.25 MG (50000 UNIT) PO CAPS
50000.0000 [IU] | ORAL_CAPSULE | ORAL | 0 refills | Status: DC
  Filled 2023-03-30: qty 12, 84d supply, fill #0

## 2023-03-30 NOTE — Progress Notes (Signed)
 .smr  Office: [phone]  /  Fax: [phone]  WEIGHT SUMMARY AND BIOMETRICS  Anthropometric Measurements Height: 5' 2 (1.575 m) Weight: 224 lb (101.6 kg) BMI (Calculated): 40.96 Weight at Last Visit: 227 lb Weight Lost Since Last Visit: 3 lb Weight Gained Since Last Visit: 0 Starting Weight: 231 lb Total Weight Loss (lbs): 7 lb (3.175 kg) Peak Weight: 235 lb   Body Composition  Body Fat %: 48.9 % Fat Mass (lbs): 109.8 lbs Muscle Mass (lbs): 109 lbs Total Body Water (lbs): 78.2 lbs Visceral Fat Rating : 17   Other Clinical Data Fasting: No Labs: No Today's Visit #: 64 Starting Date: 12/08/17    Chief Complaint: OBESITY   History of Present Illness   Rhonda Aguilar is a 68 year old female who presents for follow-up on obesity and vitamin D  deficiency.  She is currently on prescription vitamin D  at a dose of 50,000 international units weekly and requests a refill as her most recent vitamin D  level has not yet reached the target. She has lost three pounds over the past two months since her last visit. She has been intermittently journaling her diet, achieving this about 20% of the time. Her dietary goals include a calorie intake of 1,200 calories and a protein intake of 90 or more grams per day. She participates in water aerobics for 45 to 60 minutes once a week.  She experiences symptoms of seasonal affective disorder, which affects her motivation, particularly during the winter months. She describes difficulty sleeping, often staying awake until 1 AM and waking up at 4 AM, with difficulty returning to sleep. She enjoys her mornings without doing much and is working on establishing a routine.  She experienced a headache over the weekend, initially thought to be related to sinusitis due to aching teeth. She took Mucinex and rested, which improved her symptoms. She also considered that the headache might be related to her Ozempic  injection, which she took on Friday night.  She sometimes experiences episodic nausea with Ozempic  but nothing major. Her blood pressure was elevated on Friday or Saturday, for which she took hydrochlorothiazide (HCTZ) 12.5 mg, and later another 12.5 mg when the pressure did not decrease. Her blood pressure normalized by Sunday, and she experienced post-migraine fatigue on Monday.  She has been craving bread, particularly [employer]ago bagels, and has been consuming more salads, including sweet tomatoes, cucumbers, pickled okra, and pickles. She finds it challenging to consume enough protein, although she has been eating salmon and shrimp salads. She has difficulty eating large portions of protein and often cannot finish meals. She has been drinking protein drinks but sometimes forgets to refrigerate them.  She has started water aerobics, which she finds beneficial as it does not cause shortness of breath and provides her with more energy. She aims to attend twice a week but currently manages once a week. She feels more comfortable in the water and has noticed improvement in her energy levels compared to when she was working two days a week. She experiences knee pain, which sometimes wakes her up at night, and finds that moving her knee helps alleviate the pain.          PHYSICAL EXAM:  Blood pressure 127/71, pulse 76, temperature 97.7 F (36.5 C), height 5' 2 (1.575 m), weight 224 lb (101.6 kg), SpO2 95%. Body mass index is 40.97 kg/m.  DIAGNOSTIC DATA REVIEWED:  BMET    Component Value Date/Time   NA 143 11/26/2021 0919   K  4.0 11/26/2021 0919   CL 103 11/26/2021 0919   CO2 24 11/26/2021 0919   GLUCOSE 118 (H) 11/26/2021 0919   GLUCOSE 192 (H) 09/05/2019 1030   BUN 14 11/26/2021 0919   CREATININE 0.75 11/26/2021 0919   CALCIUM  9.4 11/26/2021 0919   GFRNONAA 71 04/08/2020 1012   GFRAA 81 04/08/2020 1012   Lab Results  Component Value Date   HGBA1C 6.4 (H) 11/26/2021   HGBA1C 6.7 (H) 04/19/2019   Lab Results  Component Value  Date   INSULIN  37.9 (H) 11/26/2021   INSULIN  38.2 (H) 12/08/2017   Lab Results  Component Value Date   TSH 2.550 12/08/2017   CBC    Component Value Date/Time   WBC 7.6 09/05/2019 1030   RBC 5.53 (H) 09/05/2019 1030   HGB 15.0 09/05/2019 1030   HGB 12.9 12/08/2017 1306   HCT 47.0 (H) 09/05/2019 1030   HCT 38.1 12/08/2017 1306   PLT 228 09/05/2019 1030   MCV 85.0 09/05/2019 1030   MCV 82 12/08/2017 1306   MCH 27.1 09/05/2019 1030   MCHC 31.9 09/05/2019 1030   RDW 15.4 09/05/2019 1030   RDW 14.0 12/08/2017 1306   Iron Studies No results found for: IRON, TIBC, FERRITIN, IRONPCTSAT Lipid Panel     Component Value Date/Time   CHOL 195 11/26/2021 0919   TRIG 228 (H) 11/26/2021 0919   HDL 44 11/26/2021 0919   CHOLHDL 4.4 11/26/2021 0919   LDLCALC 111 (H) 11/26/2021 0919   Hepatic Function Panel     Component Value Date/Time   PROT 6.5 11/26/2021 0919   ALBUMIN 4.6 11/26/2021 0919   AST 62 (H) 11/26/2021 0919   ALT 88 (H) 11/26/2021 0919   ALKPHOS 79 11/26/2021 0919   BILITOT 0.4 11/26/2021 0919      Component Value Date/Time   TSH 2.550 12/08/2017 1306   Nutritional Lab Results  Component Value Date   VD25OH 34.5 11/26/2021   VD25OH 42.3 02/09/2021   VD25OH 46.6 04/08/2020     Assessment and Plan    Obesity Obesity with recent weight loss of three pounds over the past two months. She is working on dietary changes and physical activity, including water aerobics once a week. Struggles with emotional eating and maintaining a consistent routine. Discussed the importance of hydration, especially after taking Ozempic , to prevent dehydration and potential headaches. - Continue current dietary plan with a calorie goal of 1200 and protein goal of 90 grams per day - Increase water intake - Continue water aerobics, aim for twice a week - Encourage journaling to track food intake and emotional triggers  Hypertension Hypertension with occasional elevated blood  pressure readings. She takes HCTZ 12.5 mg as needed, with occasional dizziness at higher doses. - Continue HCTZ 12.5 mg as needed - Monitor blood pressure regularly - Increase water intake to prevent dehydration  Migraine Intermittent migraines possibly triggered by dehydration and medication (Ozempic ). Recent episode associated with post-migraine fatigue. - Increase water intake, especially after taking Ozempic  - Monitor for migraine triggers and symptoms - Consider adjusting Ozempic  timing if migraines persist  Vitamin D  Deficiency Persistent vitamin D  deficiency despite current supplementation of 50,000 IU weekly. Recent levels not yet at goal. - Refill prescription for vitamin D  50,000 IU weekly - Recheck vitamin D  levels at next visit  Seasonal Affective Disorder Seasonal affective disorder contributing to low energy and motivation, particularly during winter months. She reports difficulty waking up and maintaining a routine. - Encourage exposure to natural light -  Monitor mood and energy levels  General Health Maintenance She is working on improving overall health through diet, exercise, and routine management. - Encourage continued physical activity - Promote balanced diet with adequate protein and fiber - Support efforts to establish a consistent daily routine  Follow-up - Schedule follow-up appointment in four weeks.         I have personally spent 40 minutes total time today in preparation, patient care, and documentation for this visit, including the following: review of clinical lab tests; review of medical tests/procedures/services.    She was informed of the importance of frequent follow up visits to maximize her success with intensive lifestyle modifications for her multiple health conditions.    Louann Penton, MD

## 2023-04-12 ENCOUNTER — Other Ambulatory Visit (HOSPITAL_COMMUNITY): Payer: Self-pay

## 2023-04-12 ENCOUNTER — Other Ambulatory Visit: Payer: Self-pay

## 2023-04-12 MED ORDER — CLOBETASOL PROPIONATE 0.05 % EX OINT
TOPICAL_OINTMENT | Freq: Two times a day (BID) | CUTANEOUS | 0 refills | Status: DC | PRN
  Filled 2023-04-12: qty 15, 7d supply, fill #0

## 2023-04-12 MED ORDER — OZEMPIC (2 MG/DOSE) 8 MG/3ML ~~LOC~~ SOPN
2.0000 mg | PEN_INJECTOR | SUBCUTANEOUS | 3 refills | Status: DC
  Filled 2023-04-12: qty 3, 28d supply, fill #0
  Filled 2023-05-13 – 2023-05-14 (×2): qty 3, 28d supply, fill #1
  Filled 2023-06-06: qty 3, 28d supply, fill #2

## 2023-04-13 DIAGNOSIS — R070 Pain in throat: Secondary | ICD-10-CM | POA: Diagnosis not present

## 2023-04-13 DIAGNOSIS — I10 Essential (primary) hypertension: Secondary | ICD-10-CM | POA: Diagnosis not present

## 2023-04-14 DIAGNOSIS — K219 Gastro-esophageal reflux disease without esophagitis: Secondary | ICD-10-CM | POA: Diagnosis not present

## 2023-04-14 DIAGNOSIS — H9201 Otalgia, right ear: Secondary | ICD-10-CM | POA: Diagnosis not present

## 2023-04-14 DIAGNOSIS — R1310 Dysphagia, unspecified: Secondary | ICD-10-CM | POA: Diagnosis not present

## 2023-05-02 ENCOUNTER — Encounter (INDEPENDENT_AMBULATORY_CARE_PROVIDER_SITE_OTHER): Payer: Self-pay | Admitting: Family Medicine

## 2023-05-02 ENCOUNTER — Ambulatory Visit (INDEPENDENT_AMBULATORY_CARE_PROVIDER_SITE_OTHER): Payer: Medicare Other | Admitting: Family Medicine

## 2023-05-02 VITALS — BP 130/80 | HR 84 | Ht 62.0 in | Wt 227.0 lb

## 2023-05-02 DIAGNOSIS — M25569 Pain in unspecified knee: Secondary | ICD-10-CM

## 2023-05-02 DIAGNOSIS — Z7985 Long-term (current) use of injectable non-insulin antidiabetic drugs: Secondary | ICD-10-CM

## 2023-05-02 DIAGNOSIS — R5383 Other fatigue: Secondary | ICD-10-CM | POA: Diagnosis not present

## 2023-05-02 DIAGNOSIS — K219 Gastro-esophageal reflux disease without esophagitis: Secondary | ICD-10-CM

## 2023-05-02 DIAGNOSIS — E119 Type 2 diabetes mellitus without complications: Secondary | ICD-10-CM

## 2023-05-02 DIAGNOSIS — E559 Vitamin D deficiency, unspecified: Secondary | ICD-10-CM

## 2023-05-02 DIAGNOSIS — G8929 Other chronic pain: Secondary | ICD-10-CM

## 2023-05-02 DIAGNOSIS — Z6841 Body Mass Index (BMI) 40.0 and over, adult: Secondary | ICD-10-CM

## 2023-05-02 DIAGNOSIS — E669 Obesity, unspecified: Secondary | ICD-10-CM

## 2023-05-02 DIAGNOSIS — E1169 Type 2 diabetes mellitus with other specified complication: Secondary | ICD-10-CM

## 2023-05-02 NOTE — Progress Notes (Signed)
 Office: [phone]  /  Fax: [phone]  WEIGHT SUMMARY AND BIOMETRICS  Anthropometric Measurements Height: 5\' 2"  (1.575 m) Weight: 227 lb (103 kg) BMI (Calculated): 41.51 Weight at Last Visit: 224 lb Weight Lost Since Last Visit: 0 lb Weight Gained Since Last Visit: 3 lb Starting Weight: 231 lb Total Weight Loss (lbs): 4 lb (1.814 kg) Peak Weight: 235 lb   Body Composition  Body Fat %: 49.3 % Fat Mass (lbs): 114 lbs Muscle Mass (lbs): 109.2 lbs Total Body Water (lbs): 79.8 lbs Visceral Fat Rating : 17   Other Clinical Data Fasting: No Labs: No Today's Visit #: 30 Starting Date: 12/08/17    Chief Complaint: OBESITY    History of Present Illness   The patient, with obesity and type 2 diabetes, presents for obesity treatment and progress monitoring.  She is on a 1200 calorie and 90 or more gram protein journaling plan, which she adheres to about 50% of the time. She engages in cardio exercise for 45 minutes twice a week and water aerobics twice a week, which she enjoys and finds beneficial. Despite these efforts, she has gained 3 pounds in the last month. She sometimes experiences fatigue during these sessions but notes improvement in her knee pain and overall energy levels.  She has a history of type 2 diabetes and is currently being treated with Ozempic. She experiences occasional nausea and diarrhea, which she attributes to Ozempic. She has difficulty tolerating eggs and certain high-fat foods, which can exacerbate her symptoms. She is trying to manage her diet by incorporating more protein and reducing high-fat foods.  She reports that Ozempic has exacerbated her reflux symptoms, although not as severely as initially. She takes Dexilant every two to three days to manage her reflux. She reports episodes of food feeling stuck in her throat, particularly with certain foods like [place] fries, which can cause spasms. She has seen an ENT who performed a scope and found  nothing abnormal. She has a history of reflux and takes Dexilant for management.  She experiences intermittent fatigue, describing days where she feels capable of significant activity and others where she feels exhausted. She has a history of PVCs but has not experienced them recently. She underwent a cardiac CTA in 2021, which was normal, and a regular stress test, which was also normal.          PHYSICAL EXAM:  Blood pressure 130/80, pulse 84, height 5\' 2"  (1.575 m), weight 227 lb (103 kg), SpO2 95%. Body mass index is 41.52 kg/m.  DIAGNOSTIC DATA REVIEWED:  BMET    Component Value Date/Time   NA 143 11/26/2021 0919   K 4.0 11/26/2021 0919   CL 103 11/26/2021 0919   CO2 24 11/26/2021 0919   GLUCOSE 118 (H) 11/26/2021 0919   GLUCOSE 192 (H) 09/05/2019 1030   BUN 14 11/26/2021 0919   CREATININE 0.75 11/26/2021 0919   CALCIUM 9.4 11/26/2021 0919   GFRNONAA 71 04/08/2020 1012   GFRAA 81 04/08/2020 1012   Lab Results  Component Value Date   HGBA1C 6.4 (H) 11/26/2021   HGBA1C 6.7 (H) 04/19/2019   Lab Results  Component Value Date   INSULIN 37.9 (H) 11/26/2021   INSULIN 38.2 (H) 12/08/2017   Lab Results  Component Value Date   TSH 2.550 12/08/2017   CBC    Component Value Date/Time   WBC 7.6 09/05/2019 1030   RBC 5.53 (H) 09/05/2019 1030   HGB 15.0 09/05/2019 1030   HGB 12.9  12/08/2017 1306   HCT 47.0 (H) 09/05/2019 1030   HCT 38.1 12/08/2017 1306   PLT 228 09/05/2019 1030   MCV 85.0 09/05/2019 1030   MCV 82 12/08/2017 1306   MCH 27.1 09/05/2019 1030   MCHC 31.9 09/05/2019 1030   RDW 15.4 09/05/2019 1030   RDW 14.0 12/08/2017 1306   Iron Studies No results found for: "IRON", "TIBC", "FERRITIN", "IRONPCTSAT" Lipid Panel     Component Value Date/Time   CHOL 195 11/26/2021 0919   TRIG 228 (H) 11/26/2021 0919   HDL 44 11/26/2021 0919   CHOLHDL 4.4 11/26/2021 0919   LDLCALC 111 (H) 11/26/2021 0919   Hepatic Function Panel     Component Value Date/Time    PROT 6.5 11/26/2021 0919   ALBUMIN 4.6 11/26/2021 0919   AST 62 (H) 11/26/2021 0919   ALT 88 (H) 11/26/2021 0919   ALKPHOS 79 11/26/2021 0919   BILITOT 0.4 11/26/2021 0919      Component Value Date/Time   TSH 2.550 12/08/2017 1306   Nutritional Lab Results  Component Value Date   VD25OH 34.5 11/26/2021   VD25OH 42.3 02/09/2021   VD25OH 46.6 04/08/2020     Assessment and Plan    Type 2 Diabetes Mellitus She is treated with Ozempic and experiences occasional nausea and diarrhea, potentially related to dietary intake and the medication. Ozempic may cause symptoms similar to dumping syndrome if caloric intake is excessive. - Continue Ozempic - Adjust dietary intake to prevent symptoms  Obesity She follows a 1200 calorie and 90+ gram protein journaling plan, achieving it about 50% of the time. She performs cardio exercises twice a week for 45 minutes and participates in water aerobics twice a week, but has gained 3 pounds in the last month. Emphasis is on consistent journaling and incremental improvements. - Encourage consistent journaling of diet and exercise - Continue current exercise regimen including water aerobics - Focus on incremental improvements  Gastroesophageal Reflux Disease (GERD) She takes Dexilant every 2-3 days and experiences occasional dysphagia and esophageal spasms, particularly with certain foods like [place] fries. Ozempic may exacerbate reflux symptoms. She avoids smoked and high-fat foods that trigger symptoms. - Continue Dexilant as needed - Avoid trigger foods -Continue working on weight loss  Knee Pain She experiences knee pain, particularly at night, with improvement from water aerobics. Surgery is not advised; non-surgical management is encouraged. - Continue water aerobics - Consider physical therapy if needed -Continue weight loss to take pressure off the joint  Fatigue She reports fluctuating fatigue, with some days more exhausting than  others. She has been evaluated by cardiology and pulmonary with no clear answer. She notes water aerobics is beneficial for energy management. - Symptoms are likely exacerbated by her weight, but unclear if they are caused by obesity. - Continue water aerobics for energy management and weight loss and will continue to monitor.        I have personally spent 35 minutes total time today in preparation, patient care, and documentation for this visit, including the following: review of clinical lab tests; review of medical tests/procedures/services.    She was informed of the importance of frequent follow up visits to maximize her success with intensive lifestyle modifications for her multiple health conditions.    Quillian Quince, MD

## 2023-05-03 ENCOUNTER — Other Ambulatory Visit: Payer: Self-pay | Admitting: Physician Assistant

## 2023-05-03 ENCOUNTER — Other Ambulatory Visit (HOSPITAL_BASED_OUTPATIENT_CLINIC_OR_DEPARTMENT_OTHER): Payer: Self-pay

## 2023-05-03 MED ORDER — ROSUVASTATIN CALCIUM 5 MG PO TABS
5.0000 mg | ORAL_TABLET | ORAL | 0 refills | Status: DC
  Filled 2023-05-03: qty 13, 91d supply, fill #0

## 2023-05-04 ENCOUNTER — Other Ambulatory Visit (HOSPITAL_BASED_OUTPATIENT_CLINIC_OR_DEPARTMENT_OTHER): Payer: Self-pay

## 2023-05-04 NOTE — Telephone Encounter (Signed)
Has she been taking

## 2023-05-05 ENCOUNTER — Encounter (HOSPITAL_COMMUNITY): Payer: Self-pay

## 2023-05-05 ENCOUNTER — Other Ambulatory Visit (HOSPITAL_COMMUNITY): Payer: Self-pay

## 2023-05-05 ENCOUNTER — Other Ambulatory Visit: Payer: Self-pay

## 2023-05-05 ENCOUNTER — Other Ambulatory Visit (HOSPITAL_BASED_OUTPATIENT_CLINIC_OR_DEPARTMENT_OTHER): Payer: Self-pay

## 2023-05-05 ENCOUNTER — Other Ambulatory Visit: Payer: Self-pay | Admitting: Family Medicine

## 2023-05-05 DIAGNOSIS — Z0000 Encounter for general adult medical examination without abnormal findings: Secondary | ICD-10-CM

## 2023-05-05 MED ORDER — BUSPIRONE HCL 5 MG PO TABS
5.0000 mg | ORAL_TABLET | Freq: Two times a day (BID) | ORAL | 1 refills | Status: AC
  Filled 2023-05-05: qty 180, 90d supply, fill #0
  Filled 2023-10-28: qty 180, 90d supply, fill #1

## 2023-05-05 NOTE — Telephone Encounter (Signed)
 LVM to [facility]

## 2023-05-14 ENCOUNTER — Other Ambulatory Visit (HOSPITAL_COMMUNITY): Payer: Self-pay

## 2023-05-19 ENCOUNTER — Ambulatory Visit
Admission: RE | Admit: 2023-05-19 | Discharge: 2023-05-19 | Disposition: A | Discharge status: Home / Self Care | Payer: Self-pay | Source: Ambulatory Visit | Attending: Family Medicine

## 2023-05-19 DIAGNOSIS — Z1231 Encounter for screening mammogram for malignant neoplasm of breast: Secondary | ICD-10-CM | POA: Diagnosis not present

## 2023-05-19 DIAGNOSIS — Z0000 Encounter for general adult medical examination without abnormal findings: Secondary | ICD-10-CM

## 2023-05-20 ENCOUNTER — Other Ambulatory Visit (HOSPITAL_BASED_OUTPATIENT_CLINIC_OR_DEPARTMENT_OTHER): Payer: Self-pay

## 2023-05-20 ENCOUNTER — Other Ambulatory Visit: Payer: Self-pay

## 2023-05-23 ENCOUNTER — Other Ambulatory Visit (HOSPITAL_BASED_OUTPATIENT_CLINIC_OR_DEPARTMENT_OTHER): Payer: Self-pay

## 2023-06-01 ENCOUNTER — Ambulatory Visit (INDEPENDENT_AMBULATORY_CARE_PROVIDER_SITE_OTHER): Admitting: Family Medicine

## 2023-06-01 ENCOUNTER — Other Ambulatory Visit (HOSPITAL_BASED_OUTPATIENT_CLINIC_OR_DEPARTMENT_OTHER): Payer: Self-pay

## 2023-06-01 ENCOUNTER — Encounter (INDEPENDENT_AMBULATORY_CARE_PROVIDER_SITE_OTHER): Payer: Self-pay | Admitting: Family Medicine

## 2023-06-01 VITALS — BP 127/85 | HR 81 | Temp 98.1°F | Ht 62.0 in | Wt 227.0 lb

## 2023-06-01 DIAGNOSIS — F4321 Adjustment disorder with depressed mood: Secondary | ICD-10-CM

## 2023-06-01 DIAGNOSIS — Z6841 Body Mass Index (BMI) 40.0 and over, adult: Secondary | ICD-10-CM

## 2023-06-01 DIAGNOSIS — I10 Essential (primary) hypertension: Secondary | ICD-10-CM

## 2023-06-01 DIAGNOSIS — E559 Vitamin D deficiency, unspecified: Secondary | ICD-10-CM | POA: Diagnosis not present

## 2023-06-01 DIAGNOSIS — E669 Obesity, unspecified: Secondary | ICD-10-CM

## 2023-06-01 MED ORDER — VITAMIN D (ERGOCALCIFEROL) 1.25 MG (50000 UNIT) PO CAPS
50000.0000 [IU] | ORAL_CAPSULE | ORAL | 0 refills | Status: DC
  Filled 2023-06-01: qty 12, 84d supply, fill #0

## 2023-06-01 NOTE — Progress Notes (Signed)
 Office: [phone]  /  Fax: [phone]  WEIGHT SUMMARY AND BIOMETRICS  Anthropometric Measurements Height: 5\' 2"  (1.575 m) Weight: 227 lb (103 kg) BMI (Calculated): 41.51 Weight at Last Visit: 227 lb Weight Lost Since Last Visit: 0 Weight Gained Since Last Visit: 0 Starting Weight: 231 lb Total Weight Loss (lbs): 4 lb (1.814 kg) Peak Weight: 235 lb   Body Composition  Body Fat %: 49.4 % Fat Mass (lbs): 112.2 lbs Muscle Mass (lbs): 109.2 lbs Total Body Water (lbs): 79.8 lbs Visceral Fat Rating : 17   Other Clinical Data Fasting: no Labs: no Today's Visit #: 80 Starting Date: 12/08/17    Chief Complaint: OBESITY   History of Present Illness Rhonda Aguilar is a 68 year old female who presents for obesity treatment and progress assessment.  She is on a 1200 calorie diet with a goal of 90 grams of protein daily but adheres to the plan about 20% of the time. She is not currently exercising, and her weight has remained stable over the past month.  She is taking Ozempic 2 mg at night and has experienced two episodes of severe nausea and abdominal cramps, one in March and another recently, both associated with meals. During these episodes, she experienced cold sweats and felt like she was going to pass out. Vomiting provided relief, and she was able to resume normal activities shortly after. She also noted a change in appetite, with decreased tolerance for protein and increased cravings for carbohydrates.  She has a history of hypertension, currently managed with chlorthalidone, valsartan, and verapamil. She mentioned that her blood pressure tends to rise when consuming too many frozen dinners or [place] food, and she adjusts her chlorthalidone dose accordingly.  She has a history of vitamin D deficiency and is being treated with prescription vitamin D. Her last vitamin D level was not at goal, and she requested a refill today.  She mentioned feeling tired all the time,  which she attributes partly to recent emotional stress due to her cat's illness and passing. She also noted that her bedroom is cluttered, which she believes affects her sleep quality.      PHYSICAL EXAM:  Blood pressure 127/85, pulse 81, temperature 98.1 F (36.7 C), height 5\' 2"  (1.575 m), weight 227 lb (103 kg), SpO2 94%. Body mass index is 41.52 kg/m.  DIAGNOSTIC DATA REVIEWED:  BMET    Component Value Date/Time   NA 143 11/26/2021 0919   K 4.0 11/26/2021 0919   CL 103 11/26/2021 0919   CO2 24 11/26/2021 0919   GLUCOSE 118 (H) 11/26/2021 0919   GLUCOSE 192 (H) 09/05/2019 1030   BUN 14 11/26/2021 0919   CREATININE 0.75 11/26/2021 0919   CALCIUM 9.4 11/26/2021 0919   GFRNONAA 71 04/08/2020 1012   GFRAA 81 04/08/2020 1012   Lab Results  Component Value Date   HGBA1C 6.4 (H) 11/26/2021   HGBA1C 6.7 (H) 04/19/2019   Lab Results  Component Value Date   INSULIN 37.9 (H) 11/26/2021   INSULIN 38.2 (H) 12/08/2017   Lab Results  Component Value Date   TSH 2.550 12/08/2017   CBC    Component Value Date/Time   WBC 7.6 09/05/2019 1030   RBC 5.53 (H) 09/05/2019 1030   HGB 15.0 09/05/2019 1030   HGB 12.9 12/08/2017 1306   HCT 47.0 (H) 09/05/2019 1030   HCT 38.1 12/08/2017 1306   PLT 228 09/05/2019 1030   MCV 85.0 09/05/2019 1030   MCV 82 12/08/2017  1306   MCH 27.1 09/05/2019 1030   MCHC 31.9 09/05/2019 1030   RDW 15.4 09/05/2019 1030   RDW 14.0 12/08/2017 1306   Iron Studies No results found for: "IRON", "TIBC", "FERRITIN", "IRONPCTSAT" Lipid Panel     Component Value Date/Time   CHOL 195 11/26/2021 0919   TRIG 228 (H) 11/26/2021 0919   HDL 44 11/26/2021 0919   CHOLHDL 4.4 11/26/2021 0919   LDLCALC 111 (H) 11/26/2021 0919   Hepatic Function Panel     Component Value Date/Time   PROT 6.5 11/26/2021 0919   ALBUMIN 4.6 11/26/2021 0919   AST 62 (H) 11/26/2021 0919   ALT 88 (H) 11/26/2021 0919   ALKPHOS 79 11/26/2021 0919   BILITOT 0.4 11/26/2021 0919       Component Value Date/Time   TSH 2.550 12/08/2017 1306   Nutritional Lab Results  Component Value Date   VD25OH 34.5 11/26/2021   VD25OH 42.3 02/09/2021   VD25OH 46.6 04/08/2020     Assessment and Plan Assessment & Plan Obesity She is on a 1200 calorie diet with a goal of 90 grams of protein daily but adheres only 20% of the time. She is not exercising, and her weight is unchanged over the last month. She is on Ozempic 2 mg for weight management but experiences nausea and abdominal cramps, resolving after vomiting. She is considering switching to [facility] due to these side effects. While some patients experience more GI side effects with Mounjaro, others tolerate it better. Inadequate protein intake may lead to blood sugar fluctuations and reduced metabolism, impacting weight loss. - Encourage adherence to 1200 calorie diet with 90 grams of protein daily - Consider switching from Ozempic to [facility] after discussing with primary care provider - Journal all food intake to monitor diet - Focus on nutrition before adding exercise  Hypertension She is on chlorthalidone, valsartan, and verapamil. Her blood pressure is 127/85 mmHg. She experiences fluctuations with high sodium intake from frozen dinners or [place] food and takes 12.5 mg of chlorthalidone as needed to manage these fluctuations. - Continue current antihypertensive regimen - Monitor blood pressure regularly - Avoid high sodium foods to prevent blood pressure spikes  Vitamin D deficiency She is being treated with prescription vitamin D. Her last vitamin D level was not at goal, and she requests a refill of her prescription. - Refill vitamin D prescription  Grief She is dealing with recent loss and is working on coming back around to prioritizing self care.  -Will continue to monitor her mood -Support and encouragement offered today   She was informed of the importance of frequent follow up visits to maximize her success  with intensive lifestyle modifications for her multiple health conditions.    Quillian Quince, MD

## 2023-06-29 ENCOUNTER — Other Ambulatory Visit (HOSPITAL_COMMUNITY): Payer: Self-pay

## 2023-06-29 ENCOUNTER — Encounter (INDEPENDENT_AMBULATORY_CARE_PROVIDER_SITE_OTHER): Payer: Self-pay | Admitting: Family Medicine

## 2023-06-29 ENCOUNTER — Ambulatory Visit (INDEPENDENT_AMBULATORY_CARE_PROVIDER_SITE_OTHER): Admitting: Family Medicine

## 2023-06-29 VITALS — BP 148/98 | HR 78 | Temp 97.6°F | Ht 62.0 in | Wt 226.0 lb

## 2023-06-29 DIAGNOSIS — I10 Essential (primary) hypertension: Secondary | ICD-10-CM

## 2023-06-29 DIAGNOSIS — E669 Obesity, unspecified: Secondary | ICD-10-CM | POA: Diagnosis not present

## 2023-06-29 DIAGNOSIS — Z6841 Body Mass Index (BMI) 40.0 and over, adult: Secondary | ICD-10-CM | POA: Diagnosis not present

## 2023-06-29 NOTE — Progress Notes (Signed)
 Office: [phone]  /  Fax: [phone]  WEIGHT SUMMARY AND BIOMETRICS  Anthropometric Measurements Height: 5\' 2"  (1.575 m) Weight: 226 lb (102.5 kg) BMI (Calculated): 41.33 Weight at Last Visit: 227 lb Weight Lost Since Last Visit: 1 lb Weight Gained Since Last Visit: 0 Starting Weight: 231 lb Total Weight Loss (lbs): 5 lb (2.268 kg) Peak Weight: 235 lb   Body Composition  Body Fat %: 48 % Fat Mass (lbs): 108.6 lbs Muscle Mass (lbs): 111.6 lbs Total Body Water (lbs): 78.6 lbs Visceral Fat Rating : 17   Other Clinical Data Fasting: No Labs: No Today's Visit #: 33 Starting Date: 12/08/17    Chief Complaint: OBESITY   History of Present Illness Rhonda Aguilar is a 68 year old female with obesity and hypertension who presents for obesity treatment and progress assessment.  She is adhering to a prescribed journaling plan with a daily intake of 1200 calories and 80 or more grams of protein, yet has only lost one pound in the last month. She is increasing her physical activity by walking more and engaging in gardening, which she finds physically demanding but enjoyable. She feels better being out in the sun and is trying to be more methodical in her daily activities.  She has a history of hypertension and is currently on chlorthalidone  25 mg, valsartan  320 mg, and verapamil  300 mg daily. Her blood pressure readings today were elevated at 139/92 mmHg and 148/92 mmHg, which is unusual for her as her blood pressure is normally controlled. She mentions dietary intake that may have contributed to this elevation, including crab cakes and a [place] dinner, and notes that her blood pressure was elevated at home after these meals. She took hydrochlorothalidone at home in response to the elevated readings.  She feels better overall, attributing this to increased water intake, as her mouth is less dry at night. She drinks at least two 16-ounce bottles of water daily. Her sleep pattern  includes waking up early to take out the trash and having difficulty returning to sleep. She feels more focused and in control of her diet, as she is not worried about gastrointestinal issues. She is experimenting with different foods, including soups and fresh produce, and is enjoying cooking more. She is also consuming more protein-rich foods like cottage cheese and mango, and is incorporating oatmeal with pecans and fruit into her breakfast routine.  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 She values methodical organization in her activities. She experiences mild nausea at times, which she manages by eating soup. No strong desire for bread and no current gastrointestinal issues.      PHYSICAL EXAM:  Blood pressure (!) 148/98, pulse 78, temperature 97.6 F (36.4 C), height 5\' 2"  (1.575 m), weight 226 lb (102.5 kg), SpO2 96%. Body mass index is 41.34 kg/m.  DIAGNOSTIC DATA REVIEWED:  BMET    Component Value Date/Time   NA 143 11/26/2021 0919   K 4.0 11/26/2021 0919   CL 103 11/26/2021 0919   CO2 24 11/26/2021 0919   GLUCOSE 118 (H) 11/26/2021 0919   GLUCOSE 192 (H) 09/05/2019 1030   BUN 14 11/26/2021 0919   CREATININE 0.75 11/26/2021 0919   CALCIUM  9.4 11/26/2021 0919   GFRNONAA 71 04/08/2020 1012   GFRAA 81 04/08/2020 1012   Lab Results  Component Value Date   HGBA1C 6.4 (H) 11/26/2021   HGBA1C 6.7 (H) 04/19/2019   Lab Results  Component Value Date   INSULIN   37.9 (H) 11/26/2021   INSULIN  38.2 (H) 12/08/2017   Lab Results  Component Value Date   TSH 2.550 12/08/2017   CBC    Component Value Date/Time   WBC 7.6 09/05/2019 1030   RBC 5.53 (H) 09/05/2019 1030   HGB 15.0 09/05/2019 1030   HGB 12.9 12/08/2017 1306   HCT 47.0 (H) 09/05/2019 1030   HCT 38.1 12/08/2017 1306   PLT 228 09/05/2019 1030   MCV 85.0 09/05/2019 1030   MCV 82 12/08/2017 1306   MCH 27.1 09/05/2019 1030   MCHC 31.9 09/05/2019 1030   RDW 15.4 09/05/2019 1030   RDW 14.0  12/08/2017 1306   Iron Studies No results found for: "IRON", "TIBC", "FERRITIN", "IRONPCTSAT" Lipid Panel     Component Value Date/Time   CHOL 195 11/26/2021 0919   TRIG 228 (H) 11/26/2021 0919   HDL 44 11/26/2021 0919   CHOLHDL 4.4 11/26/2021 0919   LDLCALC 111 (H) 11/26/2021 0919   Hepatic Function Panel     Component Value Date/Time   PROT 6.5 11/26/2021 0919   ALBUMIN 4.6 11/26/2021 0919   AST 62 (H) 11/26/2021 0919   ALT 88 (H) 11/26/2021 0919   ALKPHOS 79 11/26/2021 0919   BILITOT 0.4 11/26/2021 0919      Component Value Date/Time   TSH 2.550 12/08/2017 1306   Nutritional Lab Results  Component Value Date   VD25OH 34.5 11/26/2021   VD25OH 42.3 02/09/2021   VD25OH 46.6 04/08/2020     Assessment and Plan Assessment & Plan Hypertension Blood pressure is elevated at 139/92 mmHg and 148/92 mmHg, which is atypical as it is usually well-controlled. She is on chlorthalidone  25 mg, valsartan  320 mg, and verapamil  300 mg daily. Recent high-sodium dietary intake may have contributed to the elevation. She took an additional dose of hydrochlorothalidone at home upon noticing the elevation. - Continue chlorthalidone  25 mg, valsartan  320 mg, and verapamil  300 mg daily. - Monitor blood pressure at home and report persistent elevations. - Advise on reducing dietary sodium intake.  Obesity Adhering to a 1200-calorie diet with 80 or more grams of protein daily, resulting in a one-pound weight loss over the last month. Engaging in increased physical activity through gardening and walking, though not in a structured regimen. Experiencing mild nausea, managed with dietary choices. She feels more in control of her diet and enjoys cooking more. - Continue 1200-calorie diet with 80 or more grams of protein daily. - Encourage structured physical activity. - Provide a copy of the dietary plan for reference. - Consider additional protein sources to manage nausea.     I have personally  spent 32 minutes total time today in preparation, patient care, and documentation for this visit, including the following: nutritional counseling and discussing behavioral modification techniques.   She was informed of the importance of frequent follow up visits to maximize her success with intensive lifestyle modifications for her multiple health conditions.    Jasmine Mesi, MD

## 2023-06-30 ENCOUNTER — Other Ambulatory Visit (HOSPITAL_COMMUNITY): Payer: Self-pay

## 2023-06-30 MED ORDER — CLOBETASOL PROPIONATE 0.05 % EX OINT
1.0000 | TOPICAL_OINTMENT | Freq: Two times a day (BID) | CUTANEOUS | 0 refills | Status: DC
Start: 2023-06-30 — End: 2023-09-27
  Filled 2023-06-30: qty 15, 8d supply, fill #0

## 2023-07-07 ENCOUNTER — Other Ambulatory Visit (HOSPITAL_BASED_OUTPATIENT_CLINIC_OR_DEPARTMENT_OTHER): Payer: Self-pay

## 2023-07-07 DIAGNOSIS — M21611 Bunion of right foot: Secondary | ICD-10-CM | POA: Diagnosis not present

## 2023-07-07 DIAGNOSIS — I10 Essential (primary) hypertension: Secondary | ICD-10-CM | POA: Diagnosis not present

## 2023-07-07 DIAGNOSIS — M21612 Bunion of left foot: Secondary | ICD-10-CM | POA: Diagnosis not present

## 2023-07-07 DIAGNOSIS — E1165 Type 2 diabetes mellitus with hyperglycemia: Secondary | ICD-10-CM | POA: Diagnosis not present

## 2023-07-07 LAB — BASIC METABOLIC PANEL WITH GFR
BUN: 17 (ref 4–21)
CO2: 28 — AB (ref 13–22)
Chloride: 104 (ref 99–108)
Creatinine: 0.9 (ref 0.5–1.1)
Glucose: 160
Potassium: 3.8 meq/L (ref 3.5–5.1)
Sodium: 139 (ref 137–147)

## 2023-07-07 LAB — HEPATIC FUNCTION PANEL
ALT: 77 U/L — AB (ref 7–35)
AST: 44 — AB (ref 13–35)
Alkaline Phosphatase: 71 (ref 25–125)
Bilirubin, Total: 0.7

## 2023-07-07 LAB — COMPREHENSIVE METABOLIC PANEL WITH GFR
Albumin: 4.4 (ref 3.5–5.0)
Calcium: 9.3 (ref 8.7–10.7)
eGFR: 67

## 2023-07-07 LAB — HEMOGLOBIN A1C: Hemoglobin A1C: 6.9

## 2023-07-07 MED ORDER — MOUNJARO 5 MG/0.5ML ~~LOC~~ SOAJ
5.0000 mg | SUBCUTANEOUS | 0 refills | Status: DC
Start: 2023-07-07 — End: 2023-09-13
  Filled 2023-07-07: qty 2, 28d supply, fill #0

## 2023-07-08 ENCOUNTER — Ambulatory Visit (INDEPENDENT_AMBULATORY_CARE_PROVIDER_SITE_OTHER): Payer: Medicare HMO | Admitting: Physician Assistant

## 2023-07-08 ENCOUNTER — Encounter: Payer: Self-pay | Admitting: Physician Assistant

## 2023-07-08 ENCOUNTER — Other Ambulatory Visit (HOSPITAL_BASED_OUTPATIENT_CLINIC_OR_DEPARTMENT_OTHER): Payer: Self-pay

## 2023-07-08 DIAGNOSIS — F411 Generalized anxiety disorder: Secondary | ICD-10-CM | POA: Diagnosis not present

## 2023-07-08 DIAGNOSIS — G4733 Obstructive sleep apnea (adult) (pediatric): Secondary | ICD-10-CM

## 2023-07-08 MED ORDER — ALPRAZOLAM 0.5 MG PO TABS
0.2500 mg | ORAL_TABLET | Freq: Three times a day (TID) | ORAL | 0 refills | Status: AC | PRN
  Filled 2023-07-08: qty 30, 10d supply, fill #0

## 2023-07-08 NOTE — Progress Notes (Signed)
 [facility]  Patient ID: Rhonda Aguilar,  MRN: [id number]  PCP: Ransom Byers, MD  Date of Evaluation: 07/08/2023 Time spent:20 minutes  Chief Complaint:  Chief Complaint   Anxiety; Follow-up    HISTORY/CURRENT STATUS: For f/u of anxiety.  Completely retired now.  It's good and bad. She now has time to do things she wants, but is spending a lot of money on the house and yard. Feels like it's money money money all the time. Likes working in her yard but it's hard work. Also has a small garden and likes that. Since she doesn't have work to keep her busy, she's grieving her Mom and brother more, which is hard. It's hard seeing some of her friends 'getting older' now. "They probably feel the same way about me."   Energy and motivation are mostly good, she gets tired easily.   No extreme sadness, tearfulness, or feelings of hopelessness.  Sleeps well most of the time. ADLs and personal hygiene are normal.   Denies any changes in concentration, making decisions, or remembering things.  Appetite has not changed.  Weight is stable.  Still taking the Buspar  only once a day.  Rarely she takes Xanax  that she's had for a long time. Denies suicidal or homicidal thoughts.  Denies dizziness, syncope, seizures, numbness, tingling, tremor, unsteady gait, slurred speech, confusion. Denies muscle or joint pain, stiffness, or dystonia.  Individual Medical History/ Review of Systems: Changes? :No     Past medications for mental health diagnoses include:  Pristiq, Zoloft caused her to be more depressed.  Allergies: Glyburide, Metformin  and related, Saxenda [liraglutide -weight management], Codeine, Penicillins, and Sulfa antibiotics  Current Medications:  Current Outpatient Medications:    busPIRone  (BUSPAR ) 5 MG tablet, Take 1 tablet (5 mg total) by mouth 2 (two) times daily., Disp: 180 tablet, Rfl: 1   cetirizine (ZYRTEC) 10 MG tablet, Take 10 mg by mouth at bedtime. , Disp: , Rfl:     chlorthalidone  (HYGROTON ) 25 MG tablet, Take 1 tablet (25 mg total) by mouth in the morning with food as needed., Disp: 90 tablet, Rfl: 3   clobetasol  ointment (TEMOVATE ) 0.05 %, Apply topically 2 (two) times daily as needed., Disp: 15 g, Rfl: 0   dexlansoprazole  (DEXILANT ) 60 MG capsule, Take 1 capsule (60 mg total) by mouth daily 20 minutes before breakfast, Disp: 90 capsule, Rfl: 0   dexlansoprazole  (DEXILANT ) 60 MG capsule, Take 1 capsule (60 mg total) by mouth daily before breakfast., Disp: 90 capsule, Rfl: 4   ibuprofen (ADVIL) 200 MG tablet, Take 600 mg by mouth every 8 (eight) hours as needed for mild pain or moderate pain. , Disp: , Rfl:    mupirocin  ointment (BACTROBAN ) 2 %, Apply to the affected area 2 times a day for 5 days, Disp: 60 g, Rfl: 0   rosuvastatin  (CRESTOR ) 5 MG tablet, Take 1 tablet (5 mg total) by mouth once a week., Disp: 13 tablet, Rfl: 0   tirzepatide (MOUNJARO) 5 MG/0.5ML Pen, Inject 5 mg into the skin every 7 (seven) days., Disp: 2 mL, Rfl: 0   UNABLE TO FIND, For use when checking blood sugars twice a day for 90 days, Disp: , Rfl:    valsartan  (DIOVAN ) 320 MG tablet, Take 1 tablet (320 mg total) by mouth daily., Disp: 90 tablet, Rfl: 3   Verapamil  HCl CR 300 MG CP24, Take 1 capsule (300 mg total) by mouth daily., Disp: 90 capsule, Rfl: 3   Vitamin D , Ergocalciferol , (DRISDOL )  1.25 MG (50000 UNIT) CAPS capsule, Take 1 capsule (50,000 Units total) by mouth once a week., Disp: 12 capsule, Rfl: 0   ALPRAZolam  (XANAX ) 0.5 MG tablet, Take 0.5-2 tablets (0.25-1 mg total) by mouth every 8 (eight) hours as needed for anxiety., Disp: 30 tablet, Rfl: 0   COVID-19 mRNA vaccine, Pfizer, (COMIRNATY ) syringe, Inject into the muscle. (Patient not taking: Reported on 07/08/2023), Disp: 0.3 mL, Rfl: 0   dexlansoprazole  (DEXILANT ) 60 MG capsule, Take 1 capsule by mouth 20 minutes before breakfast, Disp: 90 capsule, Rfl: 0   influenza vaccine adjuvanted (FLUAD) 0.5 ML injection, Inject  into the muscle., Disp: 0.5 mL, Rfl: 0   Semaglutide , 2 MG/DOSE, (OZEMPIC , 2 MG/DOSE,) 8 MG/3ML SOPN, Inject 2 mg into the skin once a week. (Patient not taking: Reported on 07/08/2023), Disp: 3 mL, Rfl: 3 No current facility-administered medications for this visit.  Facility-Administered Medications Ordered in Other Visits:    sodium chloride  flush (NS) 0.9 % injection 10 mL, 10 mL, Intravenous, PRN, Micael Adas, Traci R, MD, 20 mL at 11/15/19 1610 Medication Side Effects: none  Family Medical/ Social History: Changes?  no   MENTAL HEALTH EXAM:  There were no vitals taken for this visit.There is no height or weight on file to calculate BMI.  General Appearance: Casual, Neat, Well Groomed and Obese  Eye Contact:  Good  Speech:  Clear and Coherent, Normal Rate, and Talkative  Volume:  Normal  Mood:  Euthymic  Affect:  Appropriate  Thought Process:  Goal Directed and Descriptions of Associations: Circumstantial  Orientation:  Full (Time, Place, and Person)  Thought Content: Logical   Suicidal Thoughts:  No  Homicidal Thoughts:  No  Memory:  WNL  Judgement:  Good  Insight:  Good  Psychomotor Activity:  Normal  Concentration:  Concentration: Good and Attention Span: Good  Recall:  Good  Fund of Knowledge: Good  Language: Good  Assets:  Communication Skills Desire for Improvement Financial Resources/Insurance Housing Transportation Vocational/Educational  ADL's:  Intact  Cognition: WNL  Prognosis:  Good   DIAGNOSES:    ICD-10-CM   1. Generalized anxiety disorder  F41.1     2. Obstructive sleep apnea  G47.33      Receiving Psychotherapy: No   RECOMMENDATIONS:  PDMP reviewed. No controlled substances. I provided 20 minutes of face to face time during this encounter, including time spent before and after the visit in records review, medical decision making, counseling pertinent to today's visit, and charting.   For the most part, she's doing well so no changes will be made.    Continue Xanax  0.5 mg, 1/2-1 p.o. 3 times daily as needed.  Continue BuSpar  5 mg, 1 po at bedtime.  Return in 9 months.  Marvia Slocumb, PA-C

## 2023-07-09 ENCOUNTER — Other Ambulatory Visit (HOSPITAL_BASED_OUTPATIENT_CLINIC_OR_DEPARTMENT_OTHER): Payer: Self-pay

## 2023-07-14 ENCOUNTER — Other Ambulatory Visit: Payer: Self-pay | Admitting: Gastroenterology

## 2023-07-14 DIAGNOSIS — R945 Abnormal results of liver function studies: Secondary | ICD-10-CM

## 2023-07-14 DIAGNOSIS — K5730 Diverticulosis of large intestine without perforation or abscess without bleeding: Secondary | ICD-10-CM | POA: Diagnosis not present

## 2023-07-14 DIAGNOSIS — R748 Abnormal levels of other serum enzymes: Secondary | ICD-10-CM | POA: Diagnosis not present

## 2023-07-14 DIAGNOSIS — K760 Fatty (change of) liver, not elsewhere classified: Secondary | ICD-10-CM | POA: Diagnosis not present

## 2023-07-14 DIAGNOSIS — K5901 Slow transit constipation: Secondary | ICD-10-CM | POA: Diagnosis not present

## 2023-07-19 DIAGNOSIS — L900 Lichen sclerosus et atrophicus: Secondary | ICD-10-CM | POA: Diagnosis not present

## 2023-07-20 ENCOUNTER — Ambulatory Visit
Admission: RE | Admit: 2023-07-20 | Discharge: 2023-07-20 | Discharge status: Home / Self Care | Source: Ambulatory Visit | Attending: Gastroenterology | Admitting: Gastroenterology

## 2023-07-20 DIAGNOSIS — R945 Abnormal results of liver function studies: Secondary | ICD-10-CM

## 2023-07-21 ENCOUNTER — Other Ambulatory Visit: Payer: Self-pay | Admitting: Gastroenterology

## 2023-07-21 DIAGNOSIS — R945 Abnormal results of liver function studies: Secondary | ICD-10-CM

## 2023-07-27 ENCOUNTER — Ambulatory Visit (INDEPENDENT_AMBULATORY_CARE_PROVIDER_SITE_OTHER): Admitting: Family Medicine

## 2023-07-27 ENCOUNTER — Encounter (INDEPENDENT_AMBULATORY_CARE_PROVIDER_SITE_OTHER): Payer: Self-pay | Admitting: Family Medicine

## 2023-07-27 ENCOUNTER — Ambulatory Visit
Admission: RE | Admit: 2023-07-27 | Discharge: 2023-07-27 | Disposition: A | Discharge status: Home / Self Care | Source: Ambulatory Visit | Attending: Gastroenterology | Admitting: Gastroenterology

## 2023-07-27 ENCOUNTER — Other Ambulatory Visit (HOSPITAL_BASED_OUTPATIENT_CLINIC_OR_DEPARTMENT_OTHER): Payer: Self-pay

## 2023-07-27 ENCOUNTER — Other Ambulatory Visit

## 2023-07-27 VITALS — BP 127/83 | HR 70 | Temp 97.6°F | Ht 62.0 in | Wt 226.0 lb

## 2023-07-27 DIAGNOSIS — Z7985 Long-term (current) use of injectable non-insulin antidiabetic drugs: Secondary | ICD-10-CM | POA: Diagnosis not present

## 2023-07-27 DIAGNOSIS — E119 Type 2 diabetes mellitus without complications: Secondary | ICD-10-CM

## 2023-07-27 DIAGNOSIS — E66813 Obesity, class 3: Secondary | ICD-10-CM

## 2023-07-27 DIAGNOSIS — E669 Obesity, unspecified: Secondary | ICD-10-CM | POA: Diagnosis not present

## 2023-07-27 DIAGNOSIS — E1169 Type 2 diabetes mellitus with other specified complication: Secondary | ICD-10-CM

## 2023-07-27 DIAGNOSIS — K760 Fatty (change of) liver, not elsewhere classified: Secondary | ICD-10-CM

## 2023-07-27 DIAGNOSIS — R945 Abnormal results of liver function studies: Secondary | ICD-10-CM

## 2023-07-27 DIAGNOSIS — E559 Vitamin D deficiency, unspecified: Secondary | ICD-10-CM

## 2023-07-27 DIAGNOSIS — Z6841 Body Mass Index (BMI) 40.0 and over, adult: Secondary | ICD-10-CM

## 2023-07-27 MED ORDER — VITAMIN D (ERGOCALCIFEROL) 1.25 MG (50000 UNIT) PO CAPS
50000.0000 [IU] | ORAL_CAPSULE | ORAL | 0 refills | Status: DC
  Filled 2023-07-27: qty 12, 84d supply, fill #0

## 2023-07-27 NOTE — Progress Notes (Signed)
 Office: [phone]  /  Fax: [phone]  WEIGHT SUMMARY AND BIOMETRICS  Anthropometric Measurements Height: 5\' 2"  (1.575 m) Weight: 226 lb (102.5 kg) BMI (Calculated): 41.33 Weight at Last Visit: 227 lb Weight Lost Since Last Visit: 0 Weight Gained Since Last Visit: 0 Starting Weight: 231 lb Total Weight Loss (lbs): 5 lb (2.268 kg) Peak Weight: 235 lb   Body Composition  Body Fat %: 49.2 % Fat Mass (lbs): 111.2 lbs Muscle Mass (lbs): 109.2 lbs Total Body Water (lbs): 79.4 lbs Visceral Fat Rating : 17   Other Clinical Data Fasting: no Labs: no Today's Visit #: 82 Starting Date: 12/08/17    Chief Complaint: OBESITY   History of Present Illness Rhonda Aguilar is a 68 year old female with obesity and type 2 diabetes who presents for obesity treatment and progress assessment.  She is adhering to a journaling plan with a calorie goal of 1200 and a protein goal of 80 grams, achieving this 95% of the time. Despite these efforts, she has experienced weight gain since her last visit. Her physical activity includes walking for 15 minutes twice a week.  Her type 2 diabetes is managed with diet, exercise, and Mounjaro  5 mg. Her fasting glucose levels range from 148 to 150 mg/dL. She previously used Ozempic  but finds Mounjaro  more effective. Recent diagnostic studies revealed an ALT level of 77, and imaging showed two cysts and fatty liver. She is awaiting elastography test results.  She experiences heaviness in the right upper quadrant and occasional sharp shooting pains that resolve within 24 hours. She tolerates salads and incorporates vegetables like cabbage, leeks, and onions into her diet without gastrointestinal distress.  Her energy levels have improved since discontinuing Ozempic , though she continues to have difficulty sleeping, waking at 4:30 AM and unable to return to sleep. She has reduced her Advil intake from 600 mg once or twice a day to 400 mg once a day or every  other day. She takes turmeric and ginger juice daily and tries different protein sources, including protein drinks and collagen supplements.  She enjoys gardening and has been more active around the house. She has cats and prefers staying home rather than traveling.      PHYSICAL EXAM:  Blood pressure 127/83, pulse 70, temperature 97.6 F (36.4 C), height 5\' 2"  (1.575 m), weight 226 lb (102.5 kg), SpO2 98%. Body mass index is 41.34 kg/m.  DIAGNOSTIC DATA REVIEWED:  BMET    Component Value Date/Time   NA 143 11/26/2021 0919   K 4.0 11/26/2021 0919   CL 103 11/26/2021 0919   CO2 24 11/26/2021 0919   GLUCOSE 118 (H) 11/26/2021 0919   GLUCOSE 192 (H) 09/05/2019 1030   BUN 14 11/26/2021 0919   CREATININE 0.75 11/26/2021 0919   CALCIUM  9.4 11/26/2021 0919   GFRNONAA 71 04/08/2020 1012   GFRAA 81 04/08/2020 1012   Lab Results  Component Value Date   HGBA1C 6.4 (H) 11/26/2021   HGBA1C 6.7 (H) 04/19/2019   Lab Results  Component Value Date   INSULIN  37.9 (H) 11/26/2021   INSULIN  38.2 (H) 12/08/2017   Lab Results  Component Value Date   TSH 2.550 12/08/2017   CBC    Component Value Date/Time   WBC 7.6 09/05/2019 1030   RBC 5.53 (H) 09/05/2019 1030   HGB 15.0 09/05/2019 1030   HGB 12.9 12/08/2017 1306   HCT 47.0 (H) 09/05/2019 1030   HCT 38.1 12/08/2017 1306   PLT 228 09/05/2019 1030  MCV 85.0 09/05/2019 1030   MCV 82 12/08/2017 1306   MCH 27.1 09/05/2019 1030   MCHC 31.9 09/05/2019 1030   RDW 15.4 09/05/2019 1030   RDW 14.0 12/08/2017 1306   Iron Studies No results found for: "IRON", "TIBC", "FERRITIN", "IRONPCTSAT" Lipid Panel     Component Value Date/Time   CHOL 195 11/26/2021 0919   TRIG 228 (H) 11/26/2021 0919   HDL 44 11/26/2021 0919   CHOLHDL 4.4 11/26/2021 0919   LDLCALC 111 (H) 11/26/2021 0919   Hepatic Function Panel     Component Value Date/Time   PROT 6.5 11/26/2021 0919   ALBUMIN 4.6 11/26/2021 0919   AST 62 (H) 11/26/2021 0919   ALT 88  (H) 11/26/2021 0919   ALKPHOS 79 11/26/2021 0919   BILITOT 0.4 11/26/2021 0919      Component Value Date/Time   TSH 2.550 12/08/2017 1306   Nutritional Lab Results  Component Value Date   VD25OH 34.5 11/26/2021   VD25OH 42.3 02/09/2021   VD25OH 46.6 04/08/2020     Assessment and Plan Assessment & Plan Type 2 Diabetes Mellitus She is on Mounjaro  5 mg for diabetes management, reporting better tolerance compared to Ozempic . Fasting glucose levels remain between 148 to 150 mg/dL. Recent A1c was 6.8, slightly higher than previous 6.6 but below 7. Improved energy levels off Ozempic , incorporating more protein into her diet. - Consider increasing Mounjaro  to 7.5 mg after consulting with primary care provider - Monitor glucose levels and A1c  Metabolic Associated Steatotic Liver Disease (MASLD) Recent liver ultrasound showed two cysts and fatty liver. Elastography results pending. ALT elevated at 77. Occasional right upper quadrant heaviness and sharp pains, possibly liver-related. Reducing Advil intake, incorporating turmeric and ginger into diet. - Await elastography results - Monitor liver function tests - Reduce Advil intake - Incorporate turmeric and ginger into diet  Obesity Engaging in weight management through journaling with a calorie goal of 1200 and protein goal of 80 grams, meeting goals 95% of the time. Walking 15 minutes twice a week. Despite efforts, weight gain since last visit. Incorporating dietary changes such as more salads and protein drinks as creamer alternatives. - Continue journaling with calorie goal of 1200 and protein goal of 80 grams - Increase exercise, especially outdoors - Incorporate more dietary changes such as using protein drinks as creamer alternatives  Vitamin D  Deficiency Currently taking vitamin D  supplements with sufficient supply at home. - Continue vitamin D  supplementation  General Health Maintenance Using sun protection due to history of  skin cancer. - Continue using zinc oxide sunscreen for sun protection  Follow-up Follow-up appointment scheduled after returning from [place] in July. - Follow up in six weeks - Schedule August appointment   She was informed of the importance of frequent follow up visits to maximize her success with intensive lifestyle modifications for her multiple health conditions.    Jasmine Mesi, MD

## 2023-08-01 ENCOUNTER — Other Ambulatory Visit (HOSPITAL_BASED_OUTPATIENT_CLINIC_OR_DEPARTMENT_OTHER): Payer: Self-pay

## 2023-08-01 MED ORDER — MOUNJARO 7.5 MG/0.5ML ~~LOC~~ SOAJ
7.5000 mg | SUBCUTANEOUS | 0 refills | Status: DC
  Filled 2023-08-01: qty 2, 28d supply, fill #0

## 2023-08-15 DIAGNOSIS — E119 Type 2 diabetes mellitus without complications: Secondary | ICD-10-CM | POA: Diagnosis not present

## 2023-08-16 ENCOUNTER — Other Ambulatory Visit (HOSPITAL_BASED_OUTPATIENT_CLINIC_OR_DEPARTMENT_OTHER): Payer: Self-pay

## 2023-08-16 ENCOUNTER — Other Ambulatory Visit: Payer: Self-pay

## 2023-08-23 ENCOUNTER — Telehealth: Payer: Self-pay | Admitting: Adult Health

## 2023-08-23 NOTE — Telephone Encounter (Signed)
 Pt has been informed that her new DME [facility](ph [phone]) needs an order for her CPAP supplies and office notes from last office visit

## 2023-08-23 NOTE — Telephone Encounter (Signed)
 Spoke to [employer] rep gave them our fax # to fax order for cpap supplies

## 2023-08-30 ENCOUNTER — Other Ambulatory Visit (HOSPITAL_BASED_OUTPATIENT_CLINIC_OR_DEPARTMENT_OTHER): Payer: Self-pay

## 2023-08-30 MED ORDER — MOUNJARO 10 MG/0.5ML ~~LOC~~ SOAJ
10.0000 mg | SUBCUTANEOUS | 0 refills | Status: DC
  Filled 2023-08-30: qty 2, 28d supply, fill #0
  Filled 2023-08-30: qty 2, 30d supply, fill #0

## 2023-09-13 ENCOUNTER — Encounter (INDEPENDENT_AMBULATORY_CARE_PROVIDER_SITE_OTHER): Payer: Self-pay | Admitting: Family Medicine

## 2023-09-13 ENCOUNTER — Ambulatory Visit (INDEPENDENT_AMBULATORY_CARE_PROVIDER_SITE_OTHER): Admitting: Family Medicine

## 2023-09-13 ENCOUNTER — Other Ambulatory Visit (HOSPITAL_BASED_OUTPATIENT_CLINIC_OR_DEPARTMENT_OTHER): Payer: Self-pay

## 2023-09-13 VITALS — BP 120/79 | HR 77 | Temp 97.7°F | Ht 62.0 in | Wt 226.0 lb

## 2023-09-13 DIAGNOSIS — E559 Vitamin D deficiency, unspecified: Secondary | ICD-10-CM

## 2023-09-13 DIAGNOSIS — Z6841 Body Mass Index (BMI) 40.0 and over, adult: Secondary | ICD-10-CM

## 2023-09-13 DIAGNOSIS — E1169 Type 2 diabetes mellitus with other specified complication: Secondary | ICD-10-CM

## 2023-09-13 DIAGNOSIS — E669 Obesity, unspecified: Secondary | ICD-10-CM

## 2023-09-13 DIAGNOSIS — G8929 Other chronic pain: Secondary | ICD-10-CM

## 2023-09-13 DIAGNOSIS — E119 Type 2 diabetes mellitus without complications: Secondary | ICD-10-CM | POA: Diagnosis not present

## 2023-09-13 DIAGNOSIS — M25569 Pain in unspecified knee: Secondary | ICD-10-CM

## 2023-09-13 DIAGNOSIS — Z7985 Long-term (current) use of injectable non-insulin antidiabetic drugs: Secondary | ICD-10-CM

## 2023-09-13 MED ORDER — VITAMIN D (ERGOCALCIFEROL) 1.25 MG (50000 UNIT) PO CAPS
50000.0000 [IU] | ORAL_CAPSULE | ORAL | 0 refills | Status: DC
  Filled 2023-09-13: qty 12, 84d supply, fill #0

## 2023-09-13 NOTE — Progress Notes (Signed)
 Office: [phone]  /  Fax: [phone]  WEIGHT SUMMARY AND BIOMETRICS  Anthropometric Measurements Height: 5' 2 (1.575 m) Weight: 226 lb (102.5 kg) BMI (Calculated): 41.33 Weight at Last Visit: 226 lb Weight Lost Since Last Visit: 0 Weight Gained Since Last Visit: 0 Starting Weight: 231 lb Total Weight Loss (lbs): 5 lb (2.268 kg) Peak Weight: 235 lb   Body Composition  Body Fat %: 49.5 % Fat Mass (lbs): 112.2 lbs Muscle Mass (lbs): 108.6 lbs Total Body Water (lbs): 80.2 lbs Visceral Fat Rating : 17   Other Clinical Data Fasting: no Labs: no Today's Visit #: 17 Starting Date: 12/08/17    Chief Complaint: OBESITY   Discussed the use of AI scribe software for clinical note transcription with the patient, who gave verbal consent to proceed.  History of Present Illness Rhonda Aguilar is a 68 year old female with obesity and type 2 diabetes who presents for a follow-up on her obesity treatment plan.  She is following a treatment plan for obesity, which includes a journaling plan with a daily intake of 1200 calories and at least 80 grams of protein, adhering to this plan about 50% of the time. She is not currently engaging in regular exercise but has maintained her weight over the past six weeks. She recently spent ten days in [place] dog sitting, which involved increased activity levels, including shopping and socializing. Despite some celebration eating and drinking, she managed to maintain her weight. She notes a weight gain prior to the trip, reaching 229 pounds, but has since lost the gained weight.  She started a higher dose of Mounjaro  last Monday, which has decreased her appetite. She previously stopped Ozempic , which had given her energy, but she notes a difference in energy levels with Mounjaro , though not as severe as with Ozempic . She experiences fatigue and nausea after taking Mounjaro , particularly the day after administration. She also reports an upset  stomach and nausea last Thursday, which affected her ability to eat. Her go-to foods during these times include cottage cheese, soup, and a salad mix with protein additions like chicken or salmon.  Her glucose levels have been fluctuating, with recent readings in the 130s to 140s. She notes a reading of 145 today, which is higher than usual. She attributes some fluctuations to dietary choices and activity levels.  She has a history of elevated liver enzymes for about 15 years, with recent liver function tests showing no scarring. She associates these fluctuations with sugar intake, comparing it to alcohol's effect on the liver.  She is considering a trip to [place] and [place] next year, which involves a lot of walking. She expresses concern about her knee, which she suspects has arthritis. She recently retired in August and has been more active since then.      PHYSICAL EXAM:  Blood pressure 120/79, pulse 77, temperature 97.7 F (36.5 C), height 5' 2 (1.575 m), weight 226 lb (102.5 kg), SpO2 95%. Body mass index is 41.34 kg/m.  DIAGNOSTIC DATA REVIEWED:  BMET    Component Value Date/Time   NA 143 11/26/2021 0919   K 4.0 11/26/2021 0919   CL 103 11/26/2021 0919   CO2 24 11/26/2021 0919   GLUCOSE 118 (H) 11/26/2021 0919   GLUCOSE 192 (H) 09/05/2019 1030   BUN 14 11/26/2021 0919   CREATININE 0.75 11/26/2021 0919   CALCIUM  9.4 11/26/2021 0919   GFRNONAA 71 04/08/2020 1012   GFRAA 81 04/08/2020 1012   Lab Results  Component  Value Date   HGBA1C 6.4 (H) 11/26/2021   HGBA1C 6.7 (H) 04/19/2019   Lab Results  Component Value Date   INSULIN  37.9 (H) 11/26/2021   INSULIN  38.2 (H) 12/08/2017   Lab Results  Component Value Date   TSH 2.550 12/08/2017   CBC    Component Value Date/Time   WBC 7.6 09/05/2019 1030   RBC 5.53 (H) 09/05/2019 1030   HGB 15.0 09/05/2019 1030   HGB 12.9 12/08/2017 1306   HCT 47.0 (H) 09/05/2019 1030   HCT 38.1 12/08/2017 1306   PLT 228 09/05/2019  1030   MCV 85.0 09/05/2019 1030   MCV 82 12/08/2017 1306   MCH 27.1 09/05/2019 1030   MCHC 31.9 09/05/2019 1030   RDW 15.4 09/05/2019 1030   RDW 14.0 12/08/2017 1306   Iron Studies No results found for: IRON, TIBC, FERRITIN, IRONPCTSAT Lipid Panel     Component Value Date/Time   CHOL 195 11/26/2021 0919   TRIG 228 (H) 11/26/2021 0919   HDL 44 11/26/2021 0919   CHOLHDL 4.4 11/26/2021 0919   LDLCALC 111 (H) 11/26/2021 0919   Hepatic Function Panel     Component Value Date/Time   PROT 6.5 11/26/2021 0919   ALBUMIN 4.6 11/26/2021 0919   AST 62 (H) 11/26/2021 0919   ALT 88 (H) 11/26/2021 0919   ALKPHOS 79 11/26/2021 0919   BILITOT 0.4 11/26/2021 0919      Component Value Date/Time   TSH 2.550 12/08/2017 1306   Nutritional Lab Results  Component Value Date   VD25OH 34.5 11/26/2021   VD25OH 42.3 02/09/2021   VD25OH 46.6 04/08/2020     Assessment and Plan Assessment & Plan Obesity She is adhering to a weight management plan, including a diet of 1200 calories and 80 grams of protein, about 50% of the time. She has maintained her weight over the last six weeks and is not currently exercising. A higher dose of Mounjaro  has decreased her appetite. Plans to resume water aerobics to aid in weight loss. Emphasized the importance of maintaining protein intake to prevent metabolic slowdown. Discussed the potential for increased activity and medication to contribute to weight loss. - Continue journaling 1200 calories and 80 grams of protein daily. - Encourage resumption of water aerobics. - Monitor weight and dietary adherence. - Monitor for Mounjaro  intolerance as the dose increases.  Type 2 Diabetes Mellitus Her glucose levels fluctuate, with recent readings in the 130s to 145. The increased Mounjaro  dosage is expected to improve glucose control, but full effects may take up to three more doses. She is monitoring her diet to manage blood sugar levels. Discussed the  importance of dietary adherence to prevent complications. - Monitor blood glucose levels. - Continue Mounjaro  and monitor for side effects. - Encourage dietary adherence to manage blood sugar levels.   Knee Pain She experiences knee pain, likely due to arthritis, affecting her mobility and exercise routine. Plans to request a referral to physical therapy and possibly an x-ray. Discussed the potential benefits of weight loss and physical therapy in alleviating knee pain. -Follow up with PCP as advised -Will work on weight loss to help with OA pain.  Vitamin D  Deficiency Her vitamin D  deficiency is well-managed, but she is running low on her current supply of supplements. - Refill vitamin D  prescription. - Continue monitoring vitamin D  levels.      She was informed of the importance of frequent follow up visits to maximize her success with intensive lifestyle modifications for her  multiple health conditions.    Louann Penton, MD

## 2023-09-14 ENCOUNTER — Other Ambulatory Visit: Payer: Medicare HMO

## 2023-09-15 ENCOUNTER — Ambulatory Visit (HOSPITAL_BASED_OUTPATIENT_CLINIC_OR_DEPARTMENT_OTHER)
Admission: RE | Admit: 2023-09-15 | Discharge: 2023-09-15 | Disposition: A | Discharge status: Home / Self Care | Source: Ambulatory Visit | Attending: Family Medicine | Admitting: Family Medicine

## 2023-09-15 DIAGNOSIS — E2839 Other primary ovarian failure: Secondary | ICD-10-CM | POA: Diagnosis present

## 2023-09-15 DIAGNOSIS — M85852 Other specified disorders of bone density and structure, left thigh: Secondary | ICD-10-CM | POA: Diagnosis not present

## 2023-09-15 DIAGNOSIS — Z780 Asymptomatic menopausal state: Secondary | ICD-10-CM | POA: Diagnosis not present

## 2023-09-27 ENCOUNTER — Other Ambulatory Visit: Payer: Self-pay

## 2023-09-27 ENCOUNTER — Other Ambulatory Visit (HOSPITAL_BASED_OUTPATIENT_CLINIC_OR_DEPARTMENT_OTHER): Payer: Self-pay

## 2023-09-27 MED ORDER — CLOBETASOL PROPIONATE 0.05 % EX OINT
TOPICAL_OINTMENT | CUTANEOUS | 0 refills | Status: DC
  Filled 2023-09-27: qty 15, 30d supply, fill #0

## 2023-09-27 MED ORDER — MOUNJARO 12.5 MG/0.5ML ~~LOC~~ SOAJ
12.5000 mg | SUBCUTANEOUS | 0 refills | Status: DC
  Filled 2023-09-27: qty 2, 28d supply, fill #0

## 2023-10-12 ENCOUNTER — Ambulatory Visit (INDEPENDENT_AMBULATORY_CARE_PROVIDER_SITE_OTHER): Admitting: Family Medicine

## 2023-10-12 ENCOUNTER — Encounter (INDEPENDENT_AMBULATORY_CARE_PROVIDER_SITE_OTHER): Payer: Self-pay | Admitting: Family Medicine

## 2023-10-12 VITALS — BP 120/72 | HR 93 | Temp 97.7°F | Ht 62.0 in | Wt 223.0 lb

## 2023-10-12 DIAGNOSIS — Z7985 Long-term (current) use of injectable non-insulin antidiabetic drugs: Secondary | ICD-10-CM

## 2023-10-12 DIAGNOSIS — R110 Nausea: Secondary | ICD-10-CM | POA: Diagnosis not present

## 2023-10-12 DIAGNOSIS — E1169 Type 2 diabetes mellitus with other specified complication: Secondary | ICD-10-CM

## 2023-10-12 DIAGNOSIS — R109 Unspecified abdominal pain: Secondary | ICD-10-CM | POA: Diagnosis not present

## 2023-10-12 DIAGNOSIS — K219 Gastro-esophageal reflux disease without esophagitis: Secondary | ICD-10-CM | POA: Diagnosis not present

## 2023-10-12 DIAGNOSIS — Z6841 Body Mass Index (BMI) 40.0 and over, adult: Secondary | ICD-10-CM

## 2023-10-12 DIAGNOSIS — E119 Type 2 diabetes mellitus without complications: Secondary | ICD-10-CM

## 2023-10-12 DIAGNOSIS — E669 Obesity, unspecified: Secondary | ICD-10-CM

## 2023-10-12 NOTE — Progress Notes (Signed)
 Office: [phone]  /  Fax: [phone]  WEIGHT SUMMARY AND BIOMETRICS  Anthropometric Measurements Height: 5' 2 (1.575 m) Weight: 223 lb (101.2 kg) BMI (Calculated): 40.78 Weight at Last Visit: 226 lb Weight Lost Since Last Visit: 3 lb Weight Gained Since Last Visit: 0 Starting Weight: 231 lb Total Weight Loss (lbs): 8 lb (3.629 kg) Peak Weight: 235 lb   Body Composition  Body Fat %: 48.5 % Fat Mass (lbs): 108.2 lbs Muscle Mass (lbs): 109.2 lbs Total Body Water (lbs): 78.4 lbs Visceral Fat Rating : 17   Other Clinical Data Fasting: no Labs: no Today's Visit #: 84 Starting Date: 12/08/17    Chief Complaint: OBESITY   History of Present Illness Rhonda Aguilar is a 68 year old female with obesity and type 2 diabetes who presents for obesity treatment and progress assessment.  She has been adhering to a category two eating plan, consuming 1200 calories and 80 or more grams of protein, achieving this 85% of the time. Despite not currently exercising, she has lost three pounds in the last month since her last visit.  Her glucose level was 145 mg/dL this morning. She tends to eat more carbohydrates when nauseated, which helps settle her stomach but may affect her blood sugar levels. Afternoon glucose checks tend to be lower unless she consumes sugar. Her liver function tests were previously elevated, and she underwent an ultrasound.  She feels terrible today, with increased nausea after her medication dose was increased to 12.5 mg. Significant nausea last Monday and Tuesday made it difficult to eat or drink water. She also experiences dry mouth at night, which she attributes to her medication. Phenergan  is used for nausea, causing sleepiness but not alleviating nausea. She takes 0.125 mg of Xanax  to help with sleep.  She experienced a migraine on Wednesday, different from her usual migraines, starting in the back of her neck and very painful, without visual symptoms. Her  migraines used to start in the back of her neck and were accompanied by visual symptoms.  She woke up at 5 AM today with abdominal pain and had two bowel movements. She felt nauseated but did not vomit, describing a 'hot, cold feeling.' She has been experiencing more hot flashes than usual. She had toast for breakfast and could not finish her tea. She usually has two cups of coffee on weekends but struggles during the week.  She feels more fatigued and 'flat' on her current medication compared to when she was on Ozempic . There is a decrease in physical activity and energy, noting she was able to do more physical tasks before starting her current medication. Her house is almost done, and she is planning to return her entertainment center to a bar setup.  She has been experiencing increased hunger, especially when not nauseated, and has been consuming more protein shakes to meet her protein goals. Her energy levels improve when she consumes enough protein. Her urine was darker yesterday, indicating decreased water intake.  Her cat is hyperthyroid, which has been a concern for her. She manages her cat's condition by hiding medication in wet food.      PHYSICAL EXAM:  Blood pressure 120/72, pulse 93, temperature 97.7 F (36.5 C), height 5' 2 (1.575 m), weight 223 lb (101.2 kg), SpO2 93%. Body mass index is 40.79 kg/m.  DIAGNOSTIC DATA REVIEWED:  BMET    Component Value Date/Time   NA 143 11/26/2021 0919   K 4.0 11/26/2021 0919   CL 103 11/26/2021  0919   CO2 24 11/26/2021 0919   GLUCOSE 118 (H) 11/26/2021 0919   GLUCOSE 192 (H) 09/05/2019 1030   BUN 14 11/26/2021 0919   CREATININE 0.75 11/26/2021 0919   CALCIUM  9.4 11/26/2021 0919   GFRNONAA 71 04/08/2020 1012   GFRAA 81 04/08/2020 1012   Lab Results  Component Value Date   HGBA1C 6.4 (H) 11/26/2021   HGBA1C 6.7 (H) 04/19/2019   Lab Results  Component Value Date   INSULIN  37.9 (H) 11/26/2021   INSULIN  38.2 (H) 12/08/2017    Lab Results  Component Value Date   TSH 2.550 12/08/2017   CBC    Component Value Date/Time   WBC 7.6 09/05/2019 1030   RBC 5.53 (H) 09/05/2019 1030   HGB 15.0 09/05/2019 1030   HGB 12.9 12/08/2017 1306   HCT 47.0 (H) 09/05/2019 1030   HCT 38.1 12/08/2017 1306   PLT 228 09/05/2019 1030   MCV 85.0 09/05/2019 1030   MCV 82 12/08/2017 1306   MCH 27.1 09/05/2019 1030   MCHC 31.9 09/05/2019 1030   RDW 15.4 09/05/2019 1030   RDW 14.0 12/08/2017 1306   Iron Studies No results found for: IRON, TIBC, FERRITIN, IRONPCTSAT Lipid Panel     Component Value Date/Time   CHOL 195 11/26/2021 0919   TRIG 228 (H) 11/26/2021 0919   HDL 44 11/26/2021 0919   CHOLHDL 4.4 11/26/2021 0919   LDLCALC 111 (H) 11/26/2021 0919   Hepatic Function Panel     Component Value Date/Time   PROT 6.5 11/26/2021 0919   ALBUMIN 4.6 11/26/2021 0919   AST 62 (H) 11/26/2021 0919   ALT 88 (H) 11/26/2021 0919   ALKPHOS 79 11/26/2021 0919   BILITOT 0.4 11/26/2021 0919      Component Value Date/Time   TSH 2.550 12/08/2017 1306   Nutritional Lab Results  Component Value Date   VD25OH 34.5 11/26/2021   VD25OH 42.3 02/09/2021   VD25OH 46.6 04/08/2020     Assessment and Plan Assessment & Plan Obesity On a category two eating plan with a goal of 1200 calories and 80 or more grams of protein. Adheres to this plan 85% of the time. Lost 3 pounds in the last month, with half of the weight loss being fat and half water. Muscle mass has not decreased. Not currently exercising. - Continue category two eating plan with 1200 calories and 80 or more grams of protein - Encourage adherence to dietary plan  Type 2 diabetes mellitus Managed primarily with diet and exercise. Morning glucose levels remain elevated, with a recent reading of 145 mg/dL. Experiences nausea, leading to increased carbohydrate intake, affecting blood sugar levels. GLP-1 medications primarily affect postprandial glucose levels. Liver  function tests were elevated, possibly glucose-related. Ultrasound showed no scarring. - Monitor glucose levels after meals to assess postprandial glucose control - Continue current dietary management - Consider adjusting GLP-1 medication dosage in consultation with primary care - Monitor liver function tests and consider further evaluation if elevated  Medication-induced nausea and abdominal pain Significant nausea and abdominal pain likely related to recent increase in GLP-1 medication dosage. Symptoms include difficulty eating and hydrating, with nausea exacerbated by water intake. Reports abdominal cramping and bowel movements that sap energy. Prefers not to increase dosage further due to side effects. - Contact primary care to discuss reducing GLP-1 medication dosage - Consider alternating between current and previous dosage to ease transition - Monitor symptoms and adjust treatment as needed   Gastroesophageal reflux disease (GERD) Increased reflux  symptoms, possibly exacerbated by current medication regimen. Dexilant  is taken every other day, which may need adjustment based on symptom control. Symptoms likely exacerbated by her increase in Mounjaro . - Continue Dexilant  every other day - Monitor reflux symptoms and adjust Dexilant  dosage if necessary       She was informed of the importance of frequent follow up visits to maximize her success with intensive lifestyle modifications for her multiple health conditions.    Louann Penton, MD

## 2023-10-16 NOTE — Progress Notes (Unsigned)
 PATIENT: Rhonda Aguilar DOB: 1955-11-15  REASON FOR VISIT: follow up HISTORY FROM: patient Primary neurologist: Dr. Chalice  No chief complaint on file.    HISTORY OF PRESENT ILLNESS: Today 10/16/23:  Rhonda Aguilar is a 68 y.o. female with a history of OSA on CPAP. Returns today for follow-up.        10/13/22: Rhonda Aguilar is a 68 y.o. female with a history of OSA on CPAP. Returns today for follow-up. Reports that CPAP is working well. Doesn't like to sleep without it. DL is below.      10/12/2021: Rhonda Aguilar is a 68 year old female with a history if OSA on CPAP.  She returns today for follow-up.  Her CPAP report is below.  She reports that the CPAP works well for her.  She continues to notice the benefit.  Reports that she feels well rested throughout the day.  Does report dry mouth 3-4 times a month.  She has used a chinstrap  but didn't help. Never adjusted humidity.     09/11/20: Rhonda Aguilar is a 68 year old female with a history of obstructive sleep apnea on CPAP.  She reports that the CPAP is working well for her.  She denies any new issues.  She returns today for an evaluation.    06/12/19: Rhonda Aguilar is a 68 year old female with a history of obstructive sleep apnea on CPAP.  Her download indicates that she use her machine nightly for compliance of 100%.  Leak in the 95th percentile is 8.2.  She reports that the CPAP is working well for her.  She returns today for an evaluation.  She is machine greater than 4 hours each night.  On average she uses her machine 7 hours and 51 minutes.  Her residual AHI is 0.3 on 6 to 16 cm of water with EPR of 1.  HISTORY Rhonda Aguilar is a 68 y.o. female with underlying medical history of HTN, DM, migraines, obesity and OSA who was recently evaluated in this office by Dr. Chalice for OSA evaluation on 01/17/2018.  She had been previously diagnosed with OSA in 2012 by this office and continued to use CPAP for management but was  referred back to this office as she was lost to follow-up to ensure settings were still appropriate and OSA adequately managed.  She also had complaints of ocular aura migraines with hemiparetic component with history of migraines (no prior auras) but previously controlled until recently initiating oral metformin  for newly diagnosed diabetes.  It was recommended for her to obtain home sleep study without use of CPAP to ensure adequate settings which was obtained on 02/16/2018 and showed AHI 24.9 and REM AHI 52.3 therefore recommended auto titration device with 6 to 16 cm water pressure with a EPR of 2.  Compliance report obtained from 04/02/2018 -05/01/2018 showing 30 out of 30 usage days and 30 days greater than 4 hours 400% compliance.  Average usage 7 hours and 15 minutes with residual AHI 0.2.  Leaks in the 95th percentile 11.7 L/min and pressure in the 95th percentile 11.3 cm H2O with pressure settings 6 cm H2O to 16 cm H2O with EPR level 1.  She does not have any complaints with current CPAP machine or nasal pillows.  She [facility] be [facility].  She denies any excessive daytime fatigue or insomnia.  Migraines have resolved since discontinuation of diabetic medication.  She continues to have  A1c monitored by PCP with most recent level 6.2 not on any diabetic medication.  No further concerns at this time.  She returns today for reevaluation.    REVIEW OF SYSTEMS: Out of a complete 14 system review of symptoms, the patient complains only of the following symptoms, and all other reviewed systems are negative.   ESS 3   ALLERGIES: Allergies  Allergen Reactions   Glyburide Other (See Comments)    Migraines    Metformin  And Related Other (See Comments)    migraines   Saxenda [Liraglutide -Weight Management] Nausea Only   Codeine Itching and Rash   Penicillins Rash   Sulfa Antibiotics Rash    HOME MEDICATIONS: Outpatient Medications Prior to  Visit  Medication Sig Dispense Refill   ALPRAZolam  (XANAX ) 0.5 MG tablet Take 0.5-2 tablets (0.25-1 mg total) by mouth every 8 (eight) hours as needed for anxiety. 30 tablet 0   busPIRone  (BUSPAR ) 5 MG tablet Take 1 tablet (5 mg total) by mouth 2 (two) times daily. 180 tablet 1   cetirizine (ZYRTEC) 10 MG tablet Take 10 mg by mouth at bedtime.      chlorthalidone  (HYGROTON ) 25 MG tablet Take 1 tablet (25 mg total) by mouth in the morning with food as needed. 90 tablet 3   clobetasol  ointment (TEMOVATE ) 0.05 % Apply topically 2 (two) times daily as needed. 15 g 0   dexlansoprazole  (DEXILANT ) 60 MG capsule Take 1 capsule (60 mg total) by mouth daily before breakfast. 90 capsule 4   ibuprofen (ADVIL) 200 MG tablet Take 600 mg by mouth every 8 (eight) hours as needed for mild pain or moderate pain.      influenza vaccine adjuvanted (FLUAD ) 0.5 ML injection Inject into the muscle. 0.5 mL 0   mupirocin  ointment (BACTROBAN ) 2 % Apply to the affected area 2 times a day for 5 days 60 g 0   rosuvastatin  (CRESTOR ) 5 MG tablet Take 1 tablet (5 mg total) by mouth once a week. 13 tablet 0   tirzepatide  (MOUNJARO ) 10 MG/0.5ML Pen Inject 10 mg into the skin once a week. 2 mL 0   tirzepatide  (MOUNJARO ) 12.5 MG/0.5ML Pen Inject 12.5 mg into the skin once a week. 2 mL 0   UNABLE TO FIND For use when checking blood sugars twice a day for 90 days     valsartan  (DIOVAN ) 320 MG tablet Take 1 tablet (320 mg total) by mouth daily. 90 tablet 3   Verapamil  HCl CR 300 MG CP24 Take 1 capsule (300 mg total) by mouth daily. 90 capsule 3   Vitamin D , Ergocalciferol , (DRISDOL ) 1.25 MG (50000 UNIT) CAPS capsule Take 1 capsule (50,000 Units total) by mouth once a week. 12 capsule 0   No facility-administered medications prior to visit.    PAST MEDICAL HISTORY: Past Medical History:  Diagnosis Date   Anxiety    Arthritis    Benign essential HTN 11/22/2014   Cervical lymphadenopathy 06/01/2022   Diabetes mellitus, type II  ([name])    Dyspnea    On exertion   Fatty liver    GERD (gastroesophageal reflux disease)    Hypertension    IBS (irritable bowel syndrome)    Migraine    Obesity (BMI 30-39.9) 11/22/2014   OSA (obstructive sleep apnea)    Pneumonia     PAST SURGICAL HISTORY: Past Surgical History:  Procedure Laterality Date   BREAST BIOPSY Right 2014   CHOLECYSTECTOMY N/A 09/14/2019   Procedure: LAPAROSCOPIC CHOLECYSTECTOMY;  Surgeon: Signe Cree  A, MD;  Location: [facility];  Service: General;  Laterality: N/A;   COLONOSCOPY     TONSILLECTOMY      FAMILY HISTORY: Family History  Problem Relation Age of Onset   Diabetes Mother    Heart disease Mother    Hypertension Mother    High Cholesterol Mother    Kidney disease Mother    Depression Mother    Obesity Mother    Coronary artery disease Father    High blood pressure Father    High Cholesterol Father    Depression Father    Hypertension Brother    Sleep apnea Brother    BRCA 1/2 Neg Hx    Breast cancer Neg Hx     SOCIAL HISTORY: Social History   Socioeconomic History   Marital status: Divorced    Spouse name: Not on file   Number of children: 0   Years of education: college   Highest education level: Not on file  Occupational History   Occupation: Freight forwarder cardiology  Tobacco Use   Smoking status: Never   Smokeless tobacco: Never  Vaping Use   Vaping status: Never Used  Substance and Sexual Activity   Alcohol use: Yes    Alcohol/week: 0.0 - 1.0 standard drinks of alcohol    Comment: occas.   Drug use: Never   Sexual activity: Not on file  Other Topics Concern   Not on file  Social History Narrative   Not on file   Social Drivers of Health   Financial Resource Strain: Not on file  Food Insecurity: No Food Insecurity (11/11/2021)   Hunger Vital Sign    Worried About Running Out of Food in the Last Year: Never true    Ran Out of Food in the Last Year: Never true  Transportation Needs: No Transportation  Needs (11/11/2021)   PRAPARE - Administrator, Civil Service (Medical): No    Lack of Transportation (Non-Medical): No  Physical Activity: Not on file  Stress: Not on file  Social Connections: Not on file  Intimate Partner Violence: Not on file      PHYSICAL EXAM  There were no vitals filed for this visit.  There is no height or weight on file to calculate BMI.  Generalized: Well developed, in no acute distress  Chest: Lungs clear to auscultation bilaterally  Neurological examination  Mentation: Alert oriented to time, place, history taking. Follows all commands speech and language fluent Cranial nerve II-XII: Facial symmetry noted   DIAGNOSTIC DATA (LABS, IMAGING, TESTING) - I reviewed patient records, labs, notes, testing and imaging myself where available.  Lab Results  Component Value Date   WBC 7.6 09/05/2019   HGB 15.0 09/05/2019   HCT 47.0 (H) 09/05/2019   MCV 85.0 09/05/2019   PLT 228 09/05/2019      Component Value Date/Time   NA 143 11/26/2021 0919   K 4.0 11/26/2021 0919   CL 103 11/26/2021 0919   CO2 24 11/26/2021 0919   GLUCOSE 118 (H) 11/26/2021 0919   GLUCOSE 192 (H) 09/05/2019 1030   BUN 14 11/26/2021 0919   CREATININE 0.75 11/26/2021 0919   CALCIUM  9.4 11/26/2021 0919   PROT 6.5 11/26/2021 0919   ALBUMIN 4.6 11/26/2021 0919   AST 62 (H) 11/26/2021 0919   ALT 88 (H) 11/26/2021 0919   ALKPHOS 79 11/26/2021 0919   BILITOT 0.4 11/26/2021 0919   GFRNONAA 71 04/08/2020 1012   GFRAA 81 04/08/2020 1012   Lab Results  Component Value Date   CHOL 195 11/26/2021   HDL 44 11/26/2021   LDLCALC 111 (H) 11/26/2021   TRIG 228 (H) 11/26/2021   CHOLHDL 4.4 11/26/2021   Lab Results  Component Value Date   HGBA1C 6.4 (H) 11/26/2021   No results found for: VITAMINB12 Lab Results  Component Value Date   TSH 2.550 12/08/2017      ASSESSMENT AND PLAN 68 y.o. year old female  has a past medical history of Anxiety, Arthritis, Benign  essential HTN (11/22/2014), Cervical lymphadenopathy (06/01/2022), Diabetes mellitus, type II ([name]), Dyspnea, Fatty liver, GERD (gastroesophageal reflux disease), Hypertension, IBS (irritable bowel syndrome), Migraine, Obesity (BMI 30-39.9) (11/22/2014), OSA (obstructive sleep apnea), and Pneumonia. here with:  OSA on CPAP  - CPAP compliance excellent - Good treatment of AHI  - Encourage patient to use CPAP nightly and > 4 hours each night - FU in 1 year or sooner if needed   Duwaine Russell, MSN, NP-C 10/16/2023, 3:22 PM [facility] [address], [address] [place], [place] [address] [phone]

## 2023-10-17 ENCOUNTER — Other Ambulatory Visit (HOSPITAL_BASED_OUTPATIENT_CLINIC_OR_DEPARTMENT_OTHER): Payer: Self-pay

## 2023-10-17 ENCOUNTER — Ambulatory Visit: Payer: Medicare HMO | Admitting: Adult Health

## 2023-10-17 ENCOUNTER — Encounter: Payer: Self-pay | Admitting: Adult Health

## 2023-10-17 VITALS — BP 133/67 | HR 85 | Ht 62.0 in | Wt 228.0 lb

## 2023-10-17 DIAGNOSIS — G4733 Obstructive sleep apnea (adult) (pediatric): Secondary | ICD-10-CM

## 2023-10-17 MED ORDER — MOUNJARO 10 MG/0.5ML ~~LOC~~ SOAJ
10.0000 mg | SUBCUTANEOUS | 0 refills | Status: DC
  Filled 2023-10-17: qty 2, 28d supply, fill #0

## 2023-10-17 NOTE — Patient Instructions (Addendum)
 Continue using CPAP nightly and greater than 4 hours each night Can consider a different mask style in the future If your symptoms worsen or you develop new symptoms please let us  know.

## 2023-10-31 ENCOUNTER — Other Ambulatory Visit (HOSPITAL_BASED_OUTPATIENT_CLINIC_OR_DEPARTMENT_OTHER): Payer: Self-pay

## 2023-11-01 ENCOUNTER — Ambulatory Visit: Admitting: Psychiatry

## 2023-11-01 DIAGNOSIS — F411 Generalized anxiety disorder: Secondary | ICD-10-CM | POA: Diagnosis not present

## 2023-11-01 NOTE — Progress Notes (Unsigned)
 [facility] Initial Adult Exam  Name: Rhonda Aguilar Date: 11/01/2023 MRN: [id number] DOB: [birth year] PCP: Chrystal Lamarr RAMAN, MD  Time spent: ***   Guardian/Payee:  Patient    Paperwork requested:  No   Reason for Visit /Presenting Problem:   anxiety, some depression, mother died 10 yrs ago and patient has been trying to deal with her anxiety and depression but not much better; difficulty setting limits!  Mental Status Exam:    Appearance:   Casual and Well Groomed     Behavior:  Appropriate, Sharing, and Motivated  Motor:  Normal  Speech/Language:   Clear and Coherent  Affect:  Anxious, some depression  Mood:  anxious and depressed  Thought process:  goal directed  Thought content:    Some ruminating if she is without people contact for more than 3 days  Sensory/Perceptual disturbances:    WNL  Orientation:  oriented to person, place, time/date, situation, day of week, month of year, year, and stated date of Sept. 9, 2025  Attention:  Good  Concentration:  Good  Memory:  WNL  Fund of knowledge:   Good  Insight:    Good  Judgment:   Good  Impulse Control:  Fair   Reported Symptoms:  see above  Risk Assessment: Danger to Self:  No Self-injurious Behavior: No Danger to Others: No Duty to Warn:no Physical Aggression / Violence:No  Access to Firearms a concern: No  Gang Involvement:No  Patient / guardian was educated about steps to take if suicide or homicide risk level increases between visits: yes While future psychiatric events cannot be accurately predicted, the patient does not currently require acute inpatient psychiatric care and does not currently meet [facility] [place]  involuntary commitment criteria.  Substance Abuse History: Current substance abuse: No     Past Psychiatric History:   Previous psychological history is significant for anxiety, depression, and saw Richardson Moccasin, Psy Outpatient Providers:E. Talbert, Psy History of Psych  Hospitalization: No  Psychological Testing: n/a   Abuse History: Victim of No., n/a   Report needed: No. Victim of Neglect:No. Perpetrator of n/a  Witness / Exposure to Domestic Violence: No   Protective Services Involvement: No  Witness to [employer] Violence:  No   Family History:  Family History  Problem Relation Age of Onset   Diabetes Mother    Heart disease Mother    Hypertension Mother    High Cholesterol Mother    Kidney disease Mother    Depression Mother    Obesity Mother    Coronary artery disease Father    High blood pressure Father    High Cholesterol Father    Depression Father    Hypertension Brother    Sleep apnea Brother    BRCA 1/2 Neg Hx    Breast cancer Neg Hx     Living situation: the patient lives alone  Sexual Orientation:  Straight  Relationship Status: divorced in 1982, married for 4 1/2 yrs  Name of spouse / other:n/a             If a parent, number of children / ages:no, had miscarriage towards end of marriage  Support Systems; friends  Financial Stress:  No   Income/Employment/Disability: Energy manager Service: No   Educational History: Education: master's degree  Religion/Sprituality/World View:   Protestant  Any cultural differences that may affect / interfere with treatment:  not applicable   Recreation/Hobbies: reading, music, being with friends  Stressors:Health problems  Other: starting to have more health issues, Knee pain    Strengths:  Supportive Relationships, Hopefulness, Self Advocate, and Able to Communicate Effectively  Barriers:   not being consistent; procrastination  Legal History: Pending legal issue / charges: The patient has no significant history of legal issues. History of legal issue / charges: n/a  Medical History/Surgical History:Reviewed with patient and she confirms. Past Medical History:  Diagnosis Date   Anxiety    Arthritis    Benign essential HTN  11/22/2014   Cervical lymphadenopathy 06/01/2022   Diabetes mellitus, type II ([name])    Dyspnea    On exertion   Fatty liver    GERD (gastroesophageal reflux disease)    Hypertension    IBS (irritable bowel syndrome)    Migraine    Obesity (BMI 30-39.9) 11/22/2014   OSA (obstructive sleep apnea)    Pneumonia     Past Surgical History:  Procedure Laterality Date   BREAST BIOPSY Right 2014   CHOLECYSTECTOMY N/A 09/14/2019   Procedure: LAPAROSCOPIC CHOLECYSTECTOMY;  Surgeon: Signe Mitzie LABOR, MD;  Location: [facility];  Service: General;  Laterality: N/A;   COLONOSCOPY     TONSILLECTOMY      Medications: Patient confirms info below.  Current Outpatient Medications  Medication Sig Dispense Refill   ALPRAZolam  (XANAX ) 0.5 MG tablet Take 0.5-2 tablets (0.25-1 mg total) by mouth every 8 (eight) hours as needed for anxiety. 30 tablet 0   busPIRone  (BUSPAR ) 5 MG tablet Take 1 tablet (5 mg total) by mouth 2 (two) times daily. 180 tablet 1   cetirizine (ZYRTEC) 10 MG tablet Take 10 mg by mouth at bedtime.      chlorthalidone  (HYGROTON ) 25 MG tablet Take 1 tablet (25 mg total) by mouth in the morning with food as needed. 90 tablet 3   clobetasol  ointment (TEMOVATE ) 0.05 % Apply topically 2 (two) times daily as needed. 15 g 0   dexlansoprazole  (DEXILANT ) 60 MG capsule Take 1 capsule (60 mg total) by mouth daily before breakfast. 90 capsule 4   ibuprofen (ADVIL) 200 MG tablet Take 600 mg by mouth every 8 (eight) hours as needed for mild pain or moderate pain.      influenza vaccine adjuvanted (FLUAD ) 0.5 ML injection Inject into the muscle. 0.5 mL 0   mupirocin  ointment (BACTROBAN ) 2 % Apply to the affected area 2 times a day for 5 days 60 g 0   rosuvastatin  (CRESTOR ) 5 MG tablet Take 1 tablet (5 mg total) by mouth once a week. 13 tablet 0   tirzepatide  (MOUNJARO ) 10 MG/0.5ML Pen Inject 10 mg into the skin once a week. 2 mL 0   tirzepatide  (MOUNJARO ) 12.5 MG/0.5ML Pen Inject 12.5 mg into the skin  once a week. 2 mL 0   UNABLE TO FIND For use when checking blood sugars twice a day for 90 days     valsartan  (DIOVAN ) 320 MG tablet Take 1 tablet (320 mg total) by mouth daily. 90 tablet 3   Verapamil  HCl CR 300 MG CP24 Take 1 capsule (300 mg total) by mouth daily. 90 capsule 3   Vitamin D , Ergocalciferol , (DRISDOL ) 1.25 MG (50000 UNIT) CAPS capsule Take 1 capsule (50,000 Units total) by mouth once a week. 12 capsule 0   No current facility-administered medications for this visit.    Allergies  Allergen Reactions   Glyburide Other (See Comments)    Migraines    Metformin  And Related Other (See Comments)    migraines   Saxenda [  Liraglutide -Weight Management] Nausea Only   Codeine Itching and Rash   Penicillins Rash   Sulfa Antibiotics Rash    Diagnoses:  No diagnosis found.  Treatment goal plan of care: Worked with patient collaboratively on her treatment goal plan and she is in agreement with it.  Her goals will remain on treatment plan as patient works with strategies in sessions and outside of sessions to meet her goals.  Patient is signing a copy of her printed treatment goal plan instead of signing online.  We will also keep a copy of it here in our office.  Progress will be assessed each session and documented in her progress or plan sections of treatment note. 1.  Identify life conflicts and/or situations including from the past and the present, that support current symptomology of anxiety/depression. 2.  Develop behavioral and cognitive strategies to reduce or eliminate excessive anxiety, depression.  Identify, challenge, and replace negative self-talk with more positive, realistic, and empowering self-talk. 3.  Patient will work on developing reality based, positive cognitive messages that can help her improve her mood and her outlook while also working on Teaching laboratory technician.  Plan of Care: *** Mishael Krysiak is a 68 year old female      Family  Hx  2 brothers and 1 died 4 yrs ago; had gotten very close to them. Other brother is 10 yrs older, ADD and she worries about him making poor decisions   Psych Hx  Saw Richardson Moccasin Psychologist   Goals I want to be able to stand up for myself without worrying about others because they're making me feel bad but not intentionally hurt them either.   2.  Easily pushed by others. Hard to set clear boundaries.  Hard to say no.  3. Struggling some with retirement.      Patient actively involved in session today sharing helpful information and also determining her treatment goals.  Other information gathered during this initial meeting with patient that help support her need for therapy can be found in the above sections of this initial evaluation.  Review of initial treatment goals   Barnie Bunde, LCSW

## 2023-11-11 ENCOUNTER — Other Ambulatory Visit (HOSPITAL_BASED_OUTPATIENT_CLINIC_OR_DEPARTMENT_OTHER): Payer: Self-pay

## 2023-11-14 ENCOUNTER — Encounter (INDEPENDENT_AMBULATORY_CARE_PROVIDER_SITE_OTHER): Payer: Self-pay | Admitting: Family Medicine

## 2023-11-14 ENCOUNTER — Other Ambulatory Visit (HOSPITAL_BASED_OUTPATIENT_CLINIC_OR_DEPARTMENT_OTHER): Payer: Self-pay

## 2023-11-14 ENCOUNTER — Other Ambulatory Visit: Payer: Self-pay

## 2023-11-14 ENCOUNTER — Ambulatory Visit (INDEPENDENT_AMBULATORY_CARE_PROVIDER_SITE_OTHER): Admitting: Family Medicine

## 2023-11-14 VITALS — BP 136/73 | HR 78 | Temp 97.7°F | Ht 62.0 in | Wt 226.0 lb

## 2023-11-14 DIAGNOSIS — M25562 Pain in left knee: Secondary | ICD-10-CM | POA: Diagnosis not present

## 2023-11-14 DIAGNOSIS — M5442 Lumbago with sciatica, left side: Secondary | ICD-10-CM | POA: Diagnosis not present

## 2023-11-14 DIAGNOSIS — M25561 Pain in right knee: Secondary | ICD-10-CM | POA: Diagnosis not present

## 2023-11-14 DIAGNOSIS — Z7985 Long-term (current) use of injectable non-insulin antidiabetic drugs: Secondary | ICD-10-CM

## 2023-11-14 DIAGNOSIS — E66813 Obesity, class 3: Secondary | ICD-10-CM

## 2023-11-14 DIAGNOSIS — E119 Type 2 diabetes mellitus without complications: Secondary | ICD-10-CM

## 2023-11-14 DIAGNOSIS — E559 Vitamin D deficiency, unspecified: Secondary | ICD-10-CM

## 2023-11-14 DIAGNOSIS — G8929 Other chronic pain: Secondary | ICD-10-CM | POA: Diagnosis not present

## 2023-11-14 DIAGNOSIS — M170 Bilateral primary osteoarthritis of knee: Secondary | ICD-10-CM | POA: Diagnosis not present

## 2023-11-14 DIAGNOSIS — Z6841 Body Mass Index (BMI) 40.0 and over, adult: Secondary | ICD-10-CM

## 2023-11-14 DIAGNOSIS — E1169 Type 2 diabetes mellitus with other specified complication: Secondary | ICD-10-CM

## 2023-11-14 MED ORDER — MOUNJARO 10 MG/0.5ML ~~LOC~~ SOAJ
10.0000 mg | SUBCUTANEOUS | 0 refills | Status: DC
  Filled 2023-11-14: qty 2, 28d supply, fill #0

## 2023-11-14 MED ORDER — VITAMIN D (ERGOCALCIFEROL) 1.25 MG (50000 UNIT) PO CAPS
50000.0000 [IU] | ORAL_CAPSULE | ORAL | 0 refills | Status: DC
  Filled 2023-11-14: qty 12, 84d supply, fill #0

## 2023-11-14 NOTE — Progress Notes (Signed)
 Office: [phone]  /  Fax: [phone]  WEIGHT SUMMARY AND BIOMETRICS  Anthropometric Measurements Height: 5' 2 (1.575 m) Weight: 226 lb (102.5 kg) BMI (Calculated): 41.33 Weight at Last Visit: 223 lb Weight Lost Since Last Visit: 0 Weight Gained Since Last Visit: 3 lb Starting Weight: 231 lb Total Weight Loss (lbs): 5 lb (2.268 kg) Peak Weight: 235 lb   Body Composition  Body Fat %: 49.5 % Fat Mass (lbs): 112 lbs Muscle Mass (lbs): 108.6 lbs Total Body Water (lbs): 79.4 lbs Visceral Fat Rating : 17   Other Clinical Data Fasting: no Labs: no Today's Visit #: 85 Starting Date: 12/08/17    Chief Complaint: OBESITY   History of Present Illness Rhonda Aguilar is a 68 year old female with obesity and type 2 diabetes who presents for obesity treatment and progress assessment.  She has been following a category two eating plan about twenty percent of the time. Her exercise routine has been disrupted due to bilateral knee arthritis, with increased pain in the left knee, impacting her ability to use an exercise bike every other day for four minutes.  She experiences significant knee pain, particularly in the left knee, described as 'giving away' with sharp, severe pain upon weight-bearing. This has persisted for about a week, during which she has used ibuprofen and ice. She purchased a knee brace but has not worn it. The pain worsens with stair climbing. She has a history of left knee issues since 2011, with previous episodes of swelling and pain after extensive walking.  She is treated for type 2 diabetes with Mounjaro  12.5 mg, which previously caused significant nausea. She switched to a 10 mg dose, improving her symptoms, but recently noted diminished effects. Her fasting blood sugar levels are around 130 mg/dL, with increased sugar cravings.  She reports sciatica-type pain in the low back on the left side, exacerbated by prolonged sitting and alleviated by physical  activity. She has a history of dry mouth and dry eyes, associated with her diabetes medication, which were also present with Ozempic .  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 She experienced a near-fall incident due to a twisted left ankle, which remains bothersome.      PHYSICAL EXAM:  Blood pressure 136/73, pulse 78, temperature 97.7 F (36.5 C), height 5' 2 (1.575 m), weight 226 lb (102.5 kg), SpO2 95%. Body mass index is 41.34 kg/m.  DIAGNOSTIC DATA REVIEWED:  BMET    Component Value Date/Time   NA 143 11/26/2021 0919   K 4.0 11/26/2021 0919   CL 103 11/26/2021 0919   CO2 24 11/26/2021 0919   GLUCOSE 118 (H) 11/26/2021 0919   GLUCOSE 192 (H) 09/05/2019 1030   BUN 14 11/26/2021 0919   CREATININE 0.75 11/26/2021 0919   CALCIUM  9.4 11/26/2021 0919   GFRNONAA 71 04/08/2020 1012   GFRAA 81 04/08/2020 1012   Lab Results  Component Value Date   HGBA1C 6.4 (H) 11/26/2021   HGBA1C 6.7 (H) 04/19/2019   Lab Results  Component Value Date   INSULIN  37.9 (H) 11/26/2021   INSULIN  38.2 (H) 12/08/2017   Lab Results  Component Value Date   TSH 2.550 12/08/2017   CBC    Component Value Date/Time   WBC 7.6 09/05/2019 1030   RBC 5.53 (H) 09/05/2019 1030   HGB 15.0 09/05/2019 1030   HGB 12.9 12/08/2017 1306  HCT 47.0 (H) 09/05/2019 1030   HCT 38.1 12/08/2017 1306   PLT 228 09/05/2019 1030   MCV 85.0 09/05/2019 1030   MCV 82 12/08/2017 1306   MCH 27.1 09/05/2019 1030   MCHC 31.9 09/05/2019 1030   RDW 15.4 09/05/2019 1030   RDW 14.0 12/08/2017 1306   Iron Studies No results found for: IRON, TIBC, FERRITIN, IRONPCTSAT Lipid Panel     Component Value Date/Time   CHOL 195 11/26/2021 0919   TRIG 228 (H) 11/26/2021 0919   HDL 44 11/26/2021 0919   CHOLHDL 4.4 11/26/2021 0919   LDLCALC 111 (H) 11/26/2021 0919   Hepatic Function Panel      Component Value Date/Time   PROT 6.5 11/26/2021 0919   ALBUMIN 4.6 11/26/2021 0919   AST 62 (H) 11/26/2021 0919   ALT 88 (H) 11/26/2021 0919   ALKPHOS 79 11/26/2021 0919   BILITOT 0.4 11/26/2021 0919      Component Value Date/Time   TSH 2.550 12/08/2017 1306   Nutritional Lab Results  Component Value Date   VD25OH 34.5 11/26/2021   VD25OH 42.3 02/09/2021   VD25OH 46.6 04/08/2020     Assessment and Plan Assessment & Plan Class 3 Obesity Class 3 obesity with difficulty adhering to the prescribed eating plan and exercise regimen due to bilateral knee pain. - Continue category two eating plan - Refer to sports medicine for knee evaluation to facilitate exercise  Type 2 Diabetes Mellitus Type 2 diabetes mellitus managed with Mounjaro . Previous dose of 12.5 mg caused significant nausea. Currently on 10 mg with improved tolerance but recent decrease in perceived efficacy. Craving more sugar recently. - Prescribe Mounjaro  10 mg with alternating weekly doses of 10 mg and 12.5 mg - Encourage communication with primary care provider regarding medication adjustments - Continue diet, exercise and weight loss as discussed today as an important part of the treatment plan   Bilateral Knee Pain due to Osteoarthritis, Left Greater than Right Chronic bilateral knee pain due to osteoarthritis, with left knee more affected than right. Recent exacerbation with sharp pain and instability in the left knee. Previous advice against knee replacement; physical therapy recommended. Suspected additional tendon or ligament involvement. - Refer to sports medicine for comprehensive evaluation including x-ray and possible ultrasound - Consider physical therapy based on sports medicine evaluation - Advise against using exercise bike until further evaluation  Low Back Pain with Left-Sided Sciatica Chronic low back pain with left-sided sciatica exacerbated by prolonged sitting and certain activities. Symptoms  improve with movement. Previous physical therapy evaluation found no significant issues. - Consider physical therapy for evaluation of posture and ergonomic adjustments - Encourage regular movement to alleviate symptoms  Vitamin D  Deficiency Vitamin D  deficiency noted. - Continue current vitamin D  supplementation, refill today    Rhonda Aguilar was informed of the importance of frequent follow up visits to maximize her success with intensive lifestyle modifications for her obesity and obesity related health conditions as recommended by USPSTF and CMS guidelines   Louann Penton, MD

## 2023-11-16 ENCOUNTER — Ambulatory Visit: Admitting: Psychiatry

## 2023-11-16 DIAGNOSIS — F411 Generalized anxiety disorder: Secondary | ICD-10-CM | POA: Diagnosis not present

## 2023-11-16 NOTE — Progress Notes (Signed)
 [facility] Counselor/Therapist Progress Note  Patient ID: Rhonda Aguilar, MRN: [id number],    Date: 11/16/2023  Time Spent: 55 minutes   Treatment Type: Individual Therapy  Reported Symptoms: anxiety, some depression, unresolved grief with mother's death 10 yrs ago, difficulty setting limits with others and myself   Mental Status Exam:  Appearance:   Casual and Neat     Behavior:  Appropriate and Motivated  Motor:  Normal  Speech/Language:   Clear and Coherent  Affect:  Anxious, depressed  Mood:  anxious and depressed  Thought process:  normal  Thought content:    Rumination and ruminating more at night  Sensory/Perceptual disturbances:    WNL  Orientation:  oriented to person, place, time/date, situation, day of week, month of year, year, and stated date of Sept. 24, 2025  Attention:  Good  Concentration:  Good  Memory:  WNL  Fund of knowledge:   Good  Insight:    Good  Judgment:   Good  Impulse Control:  Good   Risk Assessment: Danger to Self:  No Self-injurious Behavior: No Danger to Others: No Duty to Warn:no Physical Aggression / Violence:No  Access to Firearms a concern: No  Gang Involvement:No   Subjective:   Patient in for session today feeling some better and encouraging but aware of my need to work on my issues of anxiety, depression, and unresolved grief, and difficulty setting limits with others. Anxious about how others may perceive me and I try not to worry about it and I have to intentionally think myself out of it. I want to get over the anger part with my friend in [place]  (patient went there to help friend during illness)--happened when patient was getting ready to retire, patient tried to set boundaries, patient trying to not feel so much guilt around her feelings which are understandable. Easy to get used by others, even a friend. Strong boundary issues as it's very difficult for patient to set healthier boundaries due to feeling guilty, and has  for a long time. Working today in session of explaining more history and her difficulty in relationships, including allowing others to manipulate or take advantage of her. Trying to get to where she is able to say no as needed, stop people pleasing , and eventually feel more confident in herself and set healthier limits and better self care.   Interventions: Cognitive Behavioral Therapy, Solution-Oriented/Positive Psychology, and Ego-Supportive 1.  Work on unresolved grief issues from the past particularly regarding her mother's death, and identify life conflicts and/or situations including from the past and the present, that support current symptomology of anxiety/depression. 2.  Develop behavioral and cognitive strategies to reduce or eliminate excessive anxiety, depression.  Identify, challenge, and replace negative self-talk with more positive, realistic, and empowering self-talk. 3.  Patient will work on developing reality based, positive cognitive messages that can help her improve her mood and her outlook while also working on Teaching laboratory technician.  We discussed the 3 broad areas that she wants to also add with the goals above: 1.  I also want to be able to stand up for myself without worrying about others when they make me feel bad although not intentionally. 2. I want to be able to say no and that is hard for me.  Hard for me to set clear boundaries.  Easily pushed by others. 3.  I am struggling some with retirement and need to be able to talk through this some in counseling.  Diagnosis:   ICD-10-CM   1. Generalized anxiety disorder  F41.1      Plan: Patient working well in session today on difficulty issues that she has struggled with a long time including self-esteem, self-awareness, poor boundaries, not giving her power away to others and allow herself to be manipulated. Needing to set clear boundaries with certain people in her life. Prayer and her faith are  important to patient and she is trying to let that be helpful to her especially under stress, establishing better boundaries, and in decision-making, all of which we will focus on in her treatment in counseling. Good insight in some ways but very hard to follow through in better choices and setting clear limits, and improve her view of herself. Lots to work on and patient does seem very motivated and committed to treatment but difficulty making changed before coming to treatment. Homework assigned which patient seemed to appreciate.  Goal review and progress/challenges noted with patient.  Next appointment within 2 to 3 weeks.   Barnie Bunde, LCSW

## 2023-11-21 ENCOUNTER — Other Ambulatory Visit: Payer: Self-pay

## 2023-11-21 ENCOUNTER — Ambulatory Visit

## 2023-11-21 ENCOUNTER — Ambulatory Visit (INDEPENDENT_AMBULATORY_CARE_PROVIDER_SITE_OTHER): Admitting: Family Medicine

## 2023-11-21 VITALS — BP 120/78 | HR 88 | Ht 62.0 in | Wt 227.0 lb

## 2023-11-21 DIAGNOSIS — M25562 Pain in left knee: Secondary | ICD-10-CM | POA: Diagnosis not present

## 2023-11-21 DIAGNOSIS — M25462 Effusion, left knee: Secondary | ICD-10-CM | POA: Diagnosis not present

## 2023-11-21 DIAGNOSIS — M25461 Effusion, right knee: Secondary | ICD-10-CM | POA: Diagnosis not present

## 2023-11-21 DIAGNOSIS — M25561 Pain in right knee: Secondary | ICD-10-CM

## 2023-11-21 DIAGNOSIS — G8929 Other chronic pain: Secondary | ICD-10-CM | POA: Diagnosis not present

## 2023-11-21 DIAGNOSIS — M545 Low back pain, unspecified: Secondary | ICD-10-CM | POA: Diagnosis not present

## 2023-11-21 DIAGNOSIS — M47816 Spondylosis without myelopathy or radiculopathy, lumbar region: Secondary | ICD-10-CM | POA: Diagnosis not present

## 2023-11-21 DIAGNOSIS — M4317 Spondylolisthesis, lumbosacral region: Secondary | ICD-10-CM | POA: Diagnosis not present

## 2023-11-21 NOTE — Patient Instructions (Addendum)
 Thank you for coming in today.   Please use Voltaren gel (Generic Diclofenac Gel) up to 4x daily for pain as needed.  This is available over-the-counter as both the name brand Voltaren gel and the generic diclofenac gel.   Please get an Xray today before you leave   I've referred you to Physical Therapy.  Let us  know if you don't hear from them in one week.   Check back in 2 months

## 2023-11-21 NOTE — Progress Notes (Signed)
 [name], PhD, LAT, ATC acting as a scribe for Artist Lloyd, MD.  ZARIELLE CEA is a 68 y.o. female who presents to [facility] today for bilat knee pain, L>R, that's chronic in nature, since 2011 intermittently. She is currently trying to lose weight. She increased her activity, riding stationary bike, cleaning and then experienced an instance going up the deck stairs, where her L knee gave out on her. Pain is disturbing her sleep at night  Knee swelling: no Mechanical symptoms: no Aggravates: stairs,  Treatments tried: ice, rest,    Additionally she notes left chronic back pain.  Pain is worse with activity better with rest.  Pertinent review of systems: No fevers or chills  Relevant historical information: Diabetes, obesity.   Exam:  BP 120/78   Pulse 88   Ht 5' 2 (1.575 m)   Wt 227 lb (103 kg)   SpO2 98%   BMI 41.52 kg/m  General: Well Developed, well nourished, and in no acute distress.   MSK: Knees bilaterally mild effusion normal motion with crepitation.  Stable ligamentous exam.  L-spine: Nontender palpation midline.  Tender palpation left lumbar paraspinal musculature.  Muscles feel rigid to palpation.    Lab and Radiology Results  X-ray images lumbar spine and bilateral knees obtained today personally and independently interpreted.  Lumbar spine: At that level degenerative changes.  No acute fractures are visible.  Right knee: Mild medial and patellofemoral DJD.  No acute fractures are visible.  Left knee: Moderate to severe medial DJD.  Moderate patellofemoral DJD.  No acute fractures are visible.  Await formal radiology review    Assessment and Plan: 68 y.o. female with chronic bilateral knee pain due to DJD exacerbation.  Left knee arthritis is worse than right.  This occurs in the setting of chronic low back pain.  Plan for physical therapy referral.  Consider steroid injection if needed.  Chronic low back  pain due to degenerative changes and muscle spasm and dysfunction.  Again plan for physical therapy.  Recheck in 2 months. PDMP not reviewed this encounter. Orders Placed This Encounter  Procedures   DG Knee AP/LAT W/Sunrise Left    Standing Status:   Future    Number of Occurrences:   1    Expiration Date:   12/21/2023    Reason for Exam (SYMPTOM  OR DIAGNOSIS REQUIRED):   bilateral knee pain    Preferred imaging location?:   [facility]   DG Knee AP/LAT W/Sunrise Right    Standing Status:   Future    Number of Occurrences:   1    Expiration Date:   12/21/2023    Reason for Exam (SYMPTOM  OR DIAGNOSIS REQUIRED):   bilateral knee pain    Preferred imaging location?:   [facility]   DG Lumbar Spine 2-3 Views    Standing Status:   Future    Number of Occurrences:   1    Expiration Date:   12/21/2023    Reason for Exam (SYMPTOM  OR DIAGNOSIS REQUIRED):   low back pain    Preferred imaging location?:   [facility]   Ambulatory referral to Physical Therapy    Referral Priority:   Routine    Referral Type:   Physical Medicine    Referral Reason:   Specialty Services Required    Requested Specialty:   Physical Therapy    Number of Visits Requested:   1  No orders of the defined types were placed in this encounter.    Discussed warning signs or symptoms. Please see discharge instructions. Patient expresses understanding.   The above documentation has been reviewed and is accurate and complete Artist Lloyd, M.D.

## 2023-11-25 ENCOUNTER — Ambulatory Visit: Payer: Self-pay | Admitting: Family Medicine

## 2023-11-25 NOTE — Progress Notes (Signed)
 Right knee x-ray shows mild arthritis.

## 2023-11-25 NOTE — Progress Notes (Signed)
 Low back x-ray shows some scoliosis and arthritis especially at the base of the spine.

## 2023-11-25 NOTE — Progress Notes (Signed)
Left knee x-ray shows worsening arthritis

## 2023-11-28 ENCOUNTER — Encounter (INDEPENDENT_AMBULATORY_CARE_PROVIDER_SITE_OTHER): Payer: Self-pay | Admitting: Family Medicine

## 2023-11-30 ENCOUNTER — Ambulatory Visit: Admitting: Psychiatry

## 2023-11-30 DIAGNOSIS — F411 Generalized anxiety disorder: Secondary | ICD-10-CM

## 2023-11-30 NOTE — Progress Notes (Signed)
 [facility] Counselor/Therapist Progress Note  Patient ID: Rhonda Aguilar, MRN: [id number],    Date: 11/30/2023  Time Spent: 55 minutes   Treatment Type: Individual Therapy  Reported Symptoms: anxiety, depression decreasing, unresolved grief with mother's death 10 yrs ago, difficulty setting limits with others and myself.     Mental Status Exam:  Appearance:   Casual     Behavior:  Appropriate, Sharing, and Motivated  Motor:  Normal  Speech/Language:   Clear and Coherent  Affect:  Anxious, some depression  Mood:  anxious and depressed  Thought process:  goal directed  Thought content:    Rumination  Sensory/Perceptual disturbances:    WNL  Orientation:  oriented to person, place, time/date, situation, day of week, month of year, year, and stated date of Oct. 8, 2025  Attention:  Good  Concentration:  Good  Memory:  WNL  Fund of knowledge:   Good  Insight:    Good  Judgment:   Good  Impulse Control:  Good   Risk Assessment: Danger to Self:  No Self-injurious Behavior: No Danger to Others: No Duty to Warn:no Physical Aggression / Violence:No  Access to Firearms a concern: No  Gang Involvement:No   Subjective: Patient today working further on her issues of depression, anxiety, unresolved grief, and difficulty setting limits with other people. Started a hobby of making potholders which has brought some creativity and fun into her life. Being retired she enjoys seeing old friends and trying new things/crafts/playing musical instruments. Is so grateful to have the time now to do things she had wanted to do in the past. I need to be able to say No  to people but haven't been able to do this and will work on this in therapy sessions. Looking at participating in various activities that can help her anxiety and depression. Wants the freedom to do what she wants to do now in retirement. Some challenges in staying on topic and discussed this further today as it seemed to  impact her following through her goal and changes she is seeking.  Review of goals as noted below and discussed in her initial session here for therapy and we agreed to review them again at her next session as we were running close on time today.  Shares more today about how she is anxious as to how people see or perceive her although tries not to worry about it.  Adds that she thinks about it often as she tries to think myself out of it.  Friends carry a strong role for patient in her life and are closely related to issues with boundaries, guilt around certain feelings, issues specific to her retirement, and patient tending to feel a lot of guilt around her feelings even though her feelings are understandable.  As she has discussed some of her difficulty in relationships over time, it seems that maybe this is why this is such a difficult part of her life to focus and on but does want to stop letting others manipulate or take advantage of her, to stop her people pleasing, to say no as she needs to, and eventually feel more confident in herself and setting healthier limits for better self-care going forward.  (Reminder to follow up next session on treatment goals below.)   Interventions: Cognitive Behavioral Therapy, Solution-Oriented/Positive Psychology, and Ego-Supportive 1.  Work on unresolved grief issues from the past particularly regarding her mother's death, and identify life conflicts and/or situations including from the past  and the present, that support current symptomology of anxiety/depression. 2.  Develop behavioral and cognitive strategies to reduce or eliminate excessive anxiety, depression.  Identify, challenge, and replace negative self-talk with more positive, realistic, and empowering self-talk. 3.  Patient will work on developing reality based, positive cognitive messages that can help her improve her mood and her outlook while also working on Teaching laboratory technician.    We also discussed 3 broad areas that she wants to also add with the goals above: 1.  I also want to be able to stand up for myself without worrying about others when they make me feel bad although not intentionally. 2. I want to be able to say no and that is hard for me.  Hard for me to set clear boundaries.  Easily pushed by others. 3.  I am struggling some with retirement and need to be able to talk through this some in counseling.   Diagnosis:   ICD-10-CM   1. Generalized anxiety disorder  F41.1      Plan:  Patient today in session working further on her self-esteem, poor boundaries, self-awareness, not giving her power way to others and allowing herself to be manipulated.  Understands the need for clear boundaries with certain people in her life but has a difficult time with this which we discussed more in session today.  Does have noticeable difficulty staying on track or on topic and we discussed this in session again today. Acknowledges that her faith and prayer life are very important to her and she is finding that helpful to her especially under stress, as she tries to establish and maintain healthier boundaries, and in decision making, which we are trying to focus some on each of these areas and her counseling appointments.  Patient is motivated and shows some good insight and some ways but hard to follow through in making better choices or in setting clear limits with others, while trying to improve her view of herself and her outlook into the future.  She does remain motivated and committed to treatment although finding it hard and taking next steps, including are adding some homework as part of her treatment plan.  Goal review and progress/challenges noted with patient.  Next appointment within 2 to 3 weeks.   Barnie Bunde, LCSW

## 2023-12-05 ENCOUNTER — Other Ambulatory Visit: Payer: Self-pay

## 2023-12-05 ENCOUNTER — Other Ambulatory Visit (HOSPITAL_BASED_OUTPATIENT_CLINIC_OR_DEPARTMENT_OTHER): Payer: Self-pay

## 2023-12-05 MED ORDER — FLUZONE HIGH-DOSE 0.5 ML IM SUSY
0.5000 mL | PREFILLED_SYRINGE | Freq: Once | INTRAMUSCULAR | 0 refills | Status: AC
  Filled 2023-12-05: qty 0.5, 1d supply, fill #0

## 2023-12-06 ENCOUNTER — Other Ambulatory Visit (HOSPITAL_BASED_OUTPATIENT_CLINIC_OR_DEPARTMENT_OTHER): Payer: Self-pay

## 2023-12-06 ENCOUNTER — Ambulatory Visit: Admitting: Physical Therapy

## 2023-12-06 DIAGNOSIS — M25562 Pain in left knee: Secondary | ICD-10-CM

## 2023-12-06 DIAGNOSIS — M25561 Pain in right knee: Secondary | ICD-10-CM

## 2023-12-06 DIAGNOSIS — M5459 Other low back pain: Secondary | ICD-10-CM

## 2023-12-06 MED ORDER — CLOBETASOL PROPIONATE 0.05 % EX OINT
TOPICAL_OINTMENT | CUTANEOUS | 0 refills | Status: AC
Start: 2023-12-06 — End: ?
  Filled 2023-12-06: qty 15, 30d supply, fill #0

## 2023-12-06 NOTE — Therapy (Unsigned)
 OUTPATIENT PHYSICAL THERAPY LOWER EXTREMITY EVALUATION   Patient Name: Rhonda Aguilar MRN: [id number] DOB:[birth year], 68 y.o., female Today's Date: 12/06/2023  END OF SESSION:   Past Medical History:  Diagnosis Date   Anxiety    Arthritis    Benign essential HTN 11/22/2014   Cervical lymphadenopathy 06/01/2022   Diabetes mellitus, type II ([name])    Dyspnea    On exertion   Fatty liver    GERD (gastroesophageal reflux disease)    Hypertension    IBS (irritable bowel syndrome)    Migraine    Obesity (BMI 30-39.9) 11/22/2014   OSA (obstructive sleep apnea)    Pneumonia    Past Surgical History:  Procedure Laterality Date   BREAST BIOPSY Right 2014   CHOLECYSTECTOMY N/A 09/14/2019   Procedure: LAPAROSCOPIC CHOLECYSTECTOMY;  Surgeon: Signe Mitzie LABOR, MD;  Location: [facility];  Service: General;  Laterality: N/A;   COLONOSCOPY     TONSILLECTOMY     Patient Active Problem List   Diagnosis Date Noted   Grief 06/01/2023   Seasonal affective disorder 03/30/2023   Anxiety 02/21/2023   Cervical lymphadenopathy 06/01/2022   BMI 40.0-44.9, adult ([name]) 04/21/2022   Stress 04/21/2022   Obesity, Beginning BMI 42.25 04/21/2022   Constipation 02/23/2022   Hypertension associated with type 2 diabetes mellitus ([name]) 12/07/2021   Diabetes mellitus ([name]) 12/07/2021   Hyperlipidemia associated with type 2 diabetes mellitus ([name]) 12/07/2021   SOBOE (shortness of breath on exertion) 02/09/2021   Type 2 diabetes mellitus with hyperlipidemia ([name]) 02/09/2021   Vitamin D  deficiency 04/12/2018   Metabolic dysfunction-associated steatotic liver disease (MASLD) 03/22/2018   Depression 03/22/2018   Intractable hemiplegic migraine with status migrainosus 01/17/2018   Intractable migraine with aura with status migrainosus 01/17/2018   Ocular migraine 12/12/2017   Class 3 severe obesity with serious comorbidity and body mass index (BMI) of 40.0 to 44.9 in adult ([name]) 12/12/2017   Other fatigue  12/08/2017   Shortness of breath on exertion 12/08/2017   Type 2 diabetes mellitus without complication, without long-term current use of insulin  ([name]) 12/08/2017   OSA on CPAP 12/08/2017   Low HDL (under 40) 09/10/2016   Metabolic syndrome 09/10/2016   Primary hypertension 02/05/2016   Gastroesophageal reflux disease 02/05/2016   Other hyperlipidemia 02/05/2016   DOE (dyspnea on exertion) 11/22/2014   Benign essential HTN 11/22/2014   OSA (obstructive sleep apnea)     PCP: Michaelene  REFERRING PROVIDER: Artist Lloyd   REFERRING DIAG: Bil knee pain, low back pain   THERAPY DIAG:  No diagnosis found.  Rationale for Evaluation and Treatment: Rehabilitation  ONSET DATE: ***   SUBJECTIVE:   SUBJECTIVE STATEMENT: Bil knee pain, Low back pain - comes and goes  Has stationary bike Was squatting trying to clean- went up stairs-  knee with increased pain.  Most pain- standing- in knees and back.  Walking better.  L knee was hurting- now better. But wants to b more active, get into exercise.  Trip- in May upcoming-  Did sign up for water aerobics.  Pt is retired Engineer, civil (consulting).     PERTINENT HISTORY: HTN, DM 2, SOB with exertion (more with bending over) did stress test- ok- cardiac test was good.   PAIN:  Are you having pain? Yes: NPRS scale: 3-4 up to 8 at night 10 Pain location: L knee  Pain description: *** Aggravating factors: *** Relieving factors: ***  Are you having pain? Yes: NPRS scale: 0-1 /10 Pain location: R knee  Pain description: *** Aggravating factors: *** Relieving factors: ***  Are you having pain? Yes: NPRS scale: up to 4/10 Pain location: back ,  L low back pain  Pain description: *** Aggravating factors: standing  Relieving factors: rest  PRECAUTIONS: None  WEIGHT BEARING RESTRICTIONS: No  FALLS:  Has patient fallen in last 6 months? No   PLOF: Independent  PATIENT GOALS:  Decreased pain in knees, back, increase exercise.   NEXT MD VISIT:    OBJECTIVE:   DIAGNOSTIC FINDINGS:    PATIENT SURVEYS:    THE PATIENT SPECIFIC FUNCTIONAL SCALE  Place score of 0-10 (0 = unable to perform activity and 10 = able to perform activity at the same level as before injury or problem)  Activity Date: 12/06/23     Getting up/down from floor        3    2.   Up/down stairs without hand support        2    3.   Standing 30 min        0         Total Score 1.66      Total Score = Sum of activity scores/number of activities  Minimally Detectable Change: 3 points (for single activity); 2 points (for average score)  [facility] (nd). The Patient Specific Functional Scale . Retrieved from [email or web address]   COGNITION: Overall cognitive status: Within functional limits for tasks assessed     SENSATION: WFL  EDEMA:    POSTURE:    No Significant postural limitations  PALPATION:   LOWER EXTREMITY ROM:  Active /Passive ROM Left eval Right eval  Hip flexion    Hip extension    Hip abduction    Hip adduction    Hip internal rotation    Hip external rotation    Knee flexion    Knee extension    Ankle dorsiflexion    Ankle plantarflexion    Ankle inversion    Ankle eversion     (Blank rows = not tested)  LOWER EXTREMITY MMT:  MMT Left eval Right  eval  Hip flexion    Hip extension    Hip abduction    Hip adduction    Hip internal rotation    Hip external rotation    Knee flexion    Knee extension    Ankle dorsiflexion    Ankle plantarflexion    Ankle inversion    Ankle eversion     (Blank rows = not tested)  LOWER EXTREMITY SPECIAL TESTS:  {LEspecialtests:26242}  FUNCTIONAL TESTS:  {Functional tests:24029}  GAIT: Distance walked: *** Assistive device utilized: {Assistive devices:23999} Level of assistance: {Levels of assistance:24026} Comments: ***   TODAY'S TREATMENT:                                                                                                                               DATE:  Eval: ther ex- see below  for HEP.   PATIENT EDUCATION:  Education details: PT POC, Exam findings, HEP Person educated: Patient Education method: Explanation, Demonstration, Tactile cues, Verbal cues, and Handouts Education comprehension: verbalized understanding, returned demonstration, verbal cues required, tactile cues required, and needs further education   HOME EXERCISE PROGRAM: ***  ASSESSMENT:  CLINICAL IMPRESSION: Patient presents with primary complaint of  ***   Pt with decreased ability for full functional activities. Pt will  benefit from skilled PT to improve deficits and pain and to return to PLOF.   OBJECTIVE IMPAIRMENTS: {opptimpairments:25111}.   ACTIVITY LIMITATIONS: {activitylimitations:27494}  PARTICIPATION LIMITATIONS: {participationrestrictions:25113}  PERSONAL FACTORS: {Personal factors:25162} are also affecting patient's functional outcome.   REHAB POTENTIAL: {rehabpotential:25112}  CLINICAL DECISION MAKING: {clinical decision making:25114}  EVALUATION COMPLEXITY: {Evaluation complexity:25115}   GOALS: Goals reviewed with patient? Yes  SHORT TERM GOALS: Target date: 12/27/2023     ***  Goal status: INITIAL  2.  ***  Goal status: INITIAL  3.  ***  Goal status: INITIAL    LONG TERM GOALS: Target date: 01/31/2024    ***  Goal status: INITIAL  2.  ***  Goal status: INITIAL  3.  ***  Goal status: INITIAL  4.  *** Baseline:  Goal status: INITIAL  5.  ***  Goal status: INITIAL     PLAN:  PT FREQUENCY: 1-2x/week  PT DURATION: 8 weeks  PLANNED INTERVENTIONS: Therapeutic exercises, Therapeutic activity, Neuromuscular re-education, Patient/Family education, Self Care, Joint mobilization, Joint manipulation, Stair training, Orthotic/Fit training, DME instructions, Aquatic Therapy, Dry Needling, Electrical stimulation, Cryotherapy, Moist heat,  Taping, Ultrasound, Ionotophoresis 4mg /ml Dexamethasone , Manual therapy,  Vasopneumatic device, Traction, Spinal manipulation, Spinal mobilization,Balance training, Gait training,   PLAN FOR NEXT SESSION:    picking up bags of dirt- to move-- carries, weighted carries. Mulch, etc.  Confidence with activity, bil knee strenth,  Next visit: low back, hip mobility, knee am mobility, - overall strenth.     Tinnie Don, PT, DPT 10:56 AM  12/06/23

## 2023-12-07 ENCOUNTER — Encounter: Payer: Self-pay | Admitting: Physical Therapy

## 2023-12-07 NOTE — Progress Notes (Signed)
 Rhonda Aguilar                                          MRN: [id number]   12/07/2023   The [facility] Quality Team Specialist reviewed this patient medical record for the purposes of chart review for care gap closure. The following were reviewed: chart review for care gap closure-kidney health evaluation for diabetes:uACR.    [facility] Quality Team

## 2023-12-08 ENCOUNTER — Encounter: Payer: Self-pay | Admitting: Physical Therapy

## 2023-12-08 ENCOUNTER — Ambulatory Visit: Admitting: Physical Therapy

## 2023-12-08 DIAGNOSIS — M5459 Other low back pain: Secondary | ICD-10-CM | POA: Diagnosis not present

## 2023-12-08 DIAGNOSIS — M25561 Pain in right knee: Secondary | ICD-10-CM

## 2023-12-08 DIAGNOSIS — M25562 Pain in left knee: Secondary | ICD-10-CM | POA: Diagnosis not present

## 2023-12-08 NOTE — Therapy (Signed)
 OUTPATIENT PHYSICAL THERAPY LOWER EXTREMITY TREATMENT   Patient Name: Rhonda Aguilar MRN: [id number] DOB:28-Jul-1955, 68 y.o., female Today's Date: 12/08/2023   END OF SESSION:  PT End of Session - 12/08/23 0846     Visit Number 2    Number of Visits 16    Date for Recertification  01/31/24    Authorization Type [employer]    PT Start Time [phone]    PT Stop Time 0930    PT Time Calculation (min) 44 min    Activity Tolerance Patient tolerated treatment well    Behavior During Therapy [facility] for tasks assessed/performed          Past Medical History:  Diagnosis Date   Anxiety    Arthritis    Benign essential HTN 11/22/2014   Cervical lymphadenopathy 06/01/2022   Diabetes mellitus, type II ([name])    Dyspnea    On exertion   Fatty liver    GERD (gastroesophageal reflux disease)    Hypertension    IBS (irritable bowel syndrome)    Migraine    Obesity (BMI 30-39.9) 11/22/2014   OSA (obstructive sleep apnea)    Pneumonia    Past Surgical History:  Procedure Laterality Date   BREAST BIOPSY Right 2014   CHOLECYSTECTOMY N/A 09/14/2019   Procedure: LAPAROSCOPIC CHOLECYSTECTOMY;  Surgeon: Signe Mitzie LABOR, MD;  Location: [facility];  Service: General;  Laterality: N/A;   COLONOSCOPY     TONSILLECTOMY     Patient Active Problem List   Diagnosis Date Noted   Grief 06/01/2023   Seasonal affective disorder 03/30/2023   Anxiety 02/21/2023   Cervical lymphadenopathy 06/01/2022   BMI 40.0-44.9, adult ([name]) 04/21/2022   Stress 04/21/2022   Obesity, Beginning BMI 42.25 04/21/2022   Constipation 02/23/2022   Hypertension associated with type 2 diabetes mellitus ([name]) 12/07/2021   Diabetes mellitus ([name]) 12/07/2021   Hyperlipidemia associated with type 2 diabetes mellitus ([name]) 12/07/2021   SOBOE (shortness of breath on exertion) 02/09/2021   Type 2 diabetes mellitus with hyperlipidemia ([name]) 02/09/2021   Vitamin D  deficiency 04/12/2018   Metabolic dysfunction-associated steatotic liver  disease (MASLD) 03/22/2018   Depression 03/22/2018   Intractable hemiplegic migraine with status migrainosus 01/17/2018   Intractable migraine with aura with status migrainosus 01/17/2018   Ocular migraine 12/12/2017   Class 3 severe obesity with serious comorbidity and body mass index (BMI) of 40.0 to 44.9 in adult ([name]) 12/12/2017   Other fatigue 12/08/2017   Shortness of breath on exertion 12/08/2017   Type 2 diabetes mellitus without complication, without long-term current use of insulin  ([name]) 12/08/2017   OSA on CPAP 12/08/2017   Low HDL (under 40) 09/10/2016   Metabolic syndrome 09/10/2016   Primary hypertension 02/05/2016   Gastroesophageal reflux disease 02/05/2016   Other hyperlipidemia 02/05/2016   DOE (dyspnea on exertion) 11/22/2014   Benign essential HTN 11/22/2014   OSA (obstructive sleep apnea)     PCP: Lamarr Rotunda  REFERRING PROVIDER: Artist Lloyd   REFERRING DIAG: Bil knee pain, low back pain   THERAPY DIAG:  Acute pain of left knee  Acute pain of right knee  Other low back pain  Rationale for Evaluation and Treatment: Rehabilitation  ONSET DATE:    SUBJECTIVE:   SUBJECTIVE STATEMENT: Pt feels stiff today. L anterior hip sore today, has not been sore today.   Eval: Pt states Bil knee pain and Low back pain - comes and goes  Has stationary bike Was squatting and cleaning more recently, then went  up stairs-  knee with increased pain, felt like it was going to give out.   Most pain felt with standing- in knees and back, Walking not as bad.  L knee was hurting but has improved some. she wants to be more active in prep for trip in May. Did sign up for water aerobics.  Pt is retired Engineer, civil (consulting).    PERTINENT HISTORY: HTN, DM 2, SOB with exertion (more with bending over) -has negative cardiac tests.   PAIN:  Are you having pain? Yes: NPRS scale: 3-4 up to 8 at night/ 10 Pain location: L knee  Pain description: sore Aggravating factors: standing   Relieving factors: rest  Are you having pain? Yes: NPRS scale: 0-1 /10 Pain location: R knee  Pain description: sore Aggravating factors: standing Relieving factors: rest  Are you having pain? Yes: NPRS scale: up to 4/10 Pain location: back ,  L low back pain  Pain description: sore Aggravating factors: standing  Relieving factors: rest  PRECAUTIONS: None  WEIGHT BEARING RESTRICTIONS: No  FALLS:  Has patient fallen in last 6 months? No   PLOF: Independent  PATIENT GOALS:  Decreased pain in knees, back, increase exercise.   NEXT MD VISIT:   OBJECTIVE:   DIAGNOSTIC FINDINGS: 1. Osteopenia and degenerative change without evidence of fractures. 2. Mild lumbar levorotary scoliosis. 3. New mild grade 1 anterolisthesis at L5-S1, most probably due to disproportionate facet hypertrophy at this level. 4. Mild features of tricompartmental degenerative arthrosis of the right knee with small suprapatellar bursal effusion. 5. Progressive bone-on-bone joint space loss along the medial femorotibial joint of the left knee with reactive osteophytes. 6. Small suprapatellar bursal effusion of the left knee.   PATIENT SURVEYS:    THE PATIENT SPECIFIC FUNCTIONAL SCALE  Place score of 0-10 (0 = unable to perform activity and 10 = able to perform activity at the same level as before injury or problem)  Activity Date: 12/06/23     Getting up/down from floor        3    2.   Up/down stairs without hand support        2    3.   Standing 30 min        0         Total Score 1.66      Total Score = Sum of activity scores/number of activities  Minimally Detectable Change: 3 points (for single activity); 2 points (for average score)  [employer] [facility] (nd). The Patient Specific Functional Scale . Retrieved from [email or web address]   COGNITION: Overall cognitive status: Within functional limits for tasks  assessed     SENSATION: WFL  EDEMA: n/a   POSTURE:    No Significant postural limitations  PALPATION: Tenderness in L low back, into SI and some into glute Mild tenderness at L medial knee.    LOWER EXTREMITY ROM: Knees: WFL Hips: WFL Lumbar: mild limitation for SB and rotation.   LOWER EXTREMITY MMT:  MMT Left eval Right  eval  Hip flexion 4 4  Hip extension    Hip abduction 4+ 4+  Hip adduction    Hip internal rotation    Hip external rotation    Knee flexion 4 4  Knee extension 4 4  Ankle dorsiflexion    Ankle plantarflexion    Ankle inversion    Ankle eversion     (Blank rows = not tested)  LOWER EXTREMITY SPECIAL TESTS:    GAIT:  Comments:  unremarkable    TODAY'S TREATMENT:                                                                                                                              DATE:   12/08/2023 Therapeutic Exercise: Aerobic: Supine:  pelvic tilts x 15,  SKTC 20 sec x 3 bil;   Hip ER rom x 15 bil;  Seated:  LAQ 2.5 lb x 2 x 10 bil;     sit to stand 3 x 5  Standing:   heel raises x 15,  Marching x 20;  Stretches:  Neuromuscular Re-education: Manual Therapy: Therapeutic Activity: Self Care:  Eval:HEP- will issue next visit   PATIENT EDUCATION:  Education details: reviewed and issued initial HEP Person educated: Patient Education method: Explanation, Demonstration, Tactile cues, Verbal cues, and Handouts Education comprehension: verbalized understanding, returned demonstration, verbal cues required, tactile cues required, and needs further education   HOME EXERCISE PROGRAM: [facility]   ASSESSMENT:  CLINICAL IMPRESSION: Focus on education for initial HEP, back, hip and knee mobility today. Discussed energy conservation and activity recommendations. Pt tires during session, which has been normal for her to fatigue with activity, will continue to work on endurance throughout.    Eval: Patient presents with primary  complaint of  pain in knees and back. Pt overall deconditioned and will benefit from progressing into exercise routine for  improving strength and endurance. She has worsening OA in L knee per imaging, but pain only mild at this time. She has decreased confidence with stairs and dynamic activity due to instability and previous pain. Pt with lack of effective HEP for ongoing pain in back and knees . Pt with decreased ability for full functional activities. Pt will  benefit from skilled PT to improve deficits and pain and to return to PLOF.   OBJECTIVE IMPAIRMENTS: decreased activity tolerance, decreased mobility, decreased strength, increased muscle spasms, improper body mechanics, and pain.   ACTIVITY LIMITATIONS: carrying, lifting, standing, squatting, stairs, and locomotion level  PARTICIPATION LIMITATIONS: meal prep, cleaning, laundry, driving, shopping, community activity, and yard work  PERSONAL FACTORS: Time since onset of injury/illness/exacerbation are also affecting patient's functional outcome.   REHAB POTENTIAL: Good  CLINICAL DECISION MAKING: Stable/uncomplicated  EVALUATION COMPLEXITY: Low   GOALS: Goals reviewed with patient? Yes  SHORT TERM GOALS: Target date: 12/27/2023  Pt to be independent with initial HEP  Goal status: INITIAL  2.  Pt to demo optimal mechanics for bend/squat for IADLS.   Goal status: INITIAL   LONG TERM GOALS: Target date: 01/31/2024   Pt to be independent with final HEP  Goal status: INITIAL  2.  Pt to demo ability for stairs with reciprocal gait and 1 hand rail or less, to improve ability for community activity.   Goal status: INITIAL  3.  Pt to report decreased pain in knees and back to 0-3/10 with standing activity for at least 30 min.   Goal status: INITIAL  4.  Pt to demo strength of knees and hips to be at least 4+/5, to improve stability and pain.   Goal status: INITIAL  5.  Pt to be complaint with walking program, up to 30  min, 3x/wk to improve endurance and stamina  Goal status: INITIAL     PLAN:  PT FREQUENCY: 1-2x/week  PT DURATION: 8 weeks  PLANNED INTERVENTIONS: Therapeutic exercises, Therapeutic activity, Neuromuscular re-education, Patient/Family education, Self Care, Joint mobilization, Joint manipulation, Stair training, Orthotic/Fit training, DME instructions, Aquatic Therapy, Dry Needling, Electrical stimulation, Cryotherapy, Moist heat, Taping, Ultrasound, Ionotophoresis 4mg /ml Dexamethasone , Manual therapy,  Vasopneumatic device, Traction, Spinal manipulation, Spinal mobilization,Balance training, Gait training,   PLAN FOR NEXT SESSION:    picking up bags of dirt- to move-- carries, weighted carries. Mulch, etc.  Confidence with activity, bil knee strenth,  Next visit: low back, hip mobility, knee am mobility, - overall strength.   Progress strength, sit to stands, breathing, stairs.    Tinnie Don, PT, DPT 8:47 AM  12/08/23

## 2023-12-12 ENCOUNTER — Other Ambulatory Visit (HOSPITAL_BASED_OUTPATIENT_CLINIC_OR_DEPARTMENT_OTHER): Payer: Self-pay

## 2023-12-12 ENCOUNTER — Encounter (INDEPENDENT_AMBULATORY_CARE_PROVIDER_SITE_OTHER): Payer: Self-pay | Admitting: Family Medicine

## 2023-12-12 ENCOUNTER — Ambulatory Visit (INDEPENDENT_AMBULATORY_CARE_PROVIDER_SITE_OTHER): Admitting: Family Medicine

## 2023-12-12 VITALS — BP 123/81 | HR 80 | Temp 97.5°F | Ht 62.0 in | Wt 221.0 lb

## 2023-12-12 DIAGNOSIS — E1169 Type 2 diabetes mellitus with other specified complication: Secondary | ICD-10-CM

## 2023-12-12 DIAGNOSIS — Z7985 Long-term (current) use of injectable non-insulin antidiabetic drugs: Secondary | ICD-10-CM

## 2023-12-12 DIAGNOSIS — F419 Anxiety disorder, unspecified: Secondary | ICD-10-CM | POA: Diagnosis not present

## 2023-12-12 DIAGNOSIS — E119 Type 2 diabetes mellitus without complications: Secondary | ICD-10-CM | POA: Diagnosis not present

## 2023-12-12 DIAGNOSIS — E559 Vitamin D deficiency, unspecified: Secondary | ICD-10-CM

## 2023-12-12 DIAGNOSIS — Z6841 Body Mass Index (BMI) 40.0 and over, adult: Secondary | ICD-10-CM

## 2023-12-12 DIAGNOSIS — G8929 Other chronic pain: Secondary | ICD-10-CM

## 2023-12-12 DIAGNOSIS — E669 Obesity, unspecified: Secondary | ICD-10-CM

## 2023-12-12 DIAGNOSIS — M25562 Pain in left knee: Secondary | ICD-10-CM | POA: Diagnosis not present

## 2023-12-12 MED ORDER — VITAMIN D (ERGOCALCIFEROL) 1.25 MG (50000 UNIT) PO CAPS
50000.0000 [IU] | ORAL_CAPSULE | ORAL | 0 refills | Status: DC
Start: 2023-12-12 — End: 2024-01-09
  Filled 2023-12-12: qty 12, 84d supply, fill #0

## 2023-12-12 MED ORDER — MOUNJARO 12.5 MG/0.5ML ~~LOC~~ SOAJ
12.5000 mg | SUBCUTANEOUS | 0 refills | Status: DC
Start: 2023-12-12 — End: 2024-01-09
  Filled 2023-12-12: qty 2, 28d supply, fill #0

## 2023-12-12 NOTE — Progress Notes (Signed)
 Office: [phone]  /  Fax: [phone]  WEIGHT SUMMARY AND BIOMETRICS  Anthropometric Measurements Height: 5' 2 (1.575 m) Weight: 221 lb (100.2 kg) BMI (Calculated): 40.41 Weight at Last Visit: 226 lb Weight Lost Since Last Visit: 5 lb Weight Gained Since Last Visit: 0 Starting Weight: 231 lb Total Weight Loss (lbs): 10 lb (4.536 kg) Peak Weight: 235 lb   Body Composition  Body Fat %: 48.8 % Fat Mass (lbs): 107.8 lbs Muscle Mass (lbs): 107.4 lbs Total Body Water (lbs): 77.6 lbs Visceral Fat Rating : 17   Other Clinical Data Fasting: no Labs: no Today's Visit #: 36 Starting Date: 12/08/17    Chief Complaint: OBESITY    History of Present Illness Rhonda Aguilar is a 68 year old female with obesity and type 2 diabetes who presents for obesity treatment plan assessment and progress evaluation.  She is adhering to a category two eating plan, targeting 1200 calories and 80 or more grams of protein daily, with an adherence rate of about 80%. She has improved her intake of fruits and vegetables, hydration, and meal regularity, resulting in a five-pound weight loss over the past month. Her diet includes protein shakes, chicken sausages, egg frittatas, and deli chicken breast slices. She monitors her calorie and protein intake closely. She experiences fluctuations in hunger and energy levels, often related to her medication schedule.  She is undergoing physical therapy for her left knee, primarily using a stationary bike for 45 minutes three times a week. She experiences fatigue but finds motivation through her physical therapy sessions. She is considering deep water aerobics as a future exercise option.  Her type 2 diabetes is managed with Mounjaro , alternating between 10 mg and 12.5 mg doses. Her most recent hemoglobin A1c was 6.4. She experiences headaches and neck pain reminiscent of past migraines with the 12.5 mg dose, managed with hydrochlorothiazide, verapamil , and  occasionally Xanax .  She has a history of migraines, previously managed with Coca Cola, Advil, and anti-nausea medication. She also mentions facet spurs causing back pain, which affects her physical activity, particularly when shopping or walking long distances.      PHYSICAL EXAM:  Blood pressure 123/81, pulse 80, temperature (!) 97.5 F (36.4 C), height 5' 2 (1.575 m), weight 221 lb (100.2 kg), SpO2 94%. Body mass index is 40.42 kg/m.  DIAGNOSTIC DATA REVIEWED:  BMET    Component Value Date/Time   NA 143 11/26/2021 0919   K 4.0 11/26/2021 0919   CL 103 11/26/2021 0919   CO2 24 11/26/2021 0919   GLUCOSE 118 (H) 11/26/2021 0919   GLUCOSE 192 (H) 09/05/2019 1030   BUN 14 11/26/2021 0919   CREATININE 0.75 11/26/2021 0919   CALCIUM  9.4 11/26/2021 0919   GFRNONAA 71 04/08/2020 1012   GFRAA 81 04/08/2020 1012   Lab Results  Component Value Date   HGBA1C 6.4 (H) 11/26/2021   HGBA1C 6.7 (H) 04/19/2019   Lab Results  Component Value Date   INSULIN  37.9 (H) 11/26/2021   INSULIN  38.2 (H) 12/08/2017   Lab Results  Component Value Date   TSH 2.550 12/08/2017   CBC    Component Value Date/Time   WBC 7.6 09/05/2019 1030   RBC 5.53 (H) 09/05/2019 1030   HGB 15.0 09/05/2019 1030   HGB 12.9 12/08/2017 1306   HCT 47.0 (H) 09/05/2019 1030   HCT 38.1 12/08/2017 1306   PLT 228 09/05/2019 1030   MCV 85.0 09/05/2019 1030   MCV 82 12/08/2017 1306  MCH 27.1 09/05/2019 1030   MCHC 31.9 09/05/2019 1030   RDW 15.4 09/05/2019 1030   RDW 14.0 12/08/2017 1306   Iron Studies No results found for: IRON, TIBC, FERRITIN, IRONPCTSAT Lipid Panel     Component Value Date/Time   CHOL 195 11/26/2021 0919   TRIG 228 (H) 11/26/2021 0919   HDL 44 11/26/2021 0919   CHOLHDL 4.4 11/26/2021 0919   LDLCALC 111 (H) 11/26/2021 0919   Hepatic Function Panel     Component Value Date/Time   PROT 6.5 11/26/2021 0919   ALBUMIN 4.6 11/26/2021 0919   AST 62 (H) 11/26/2021 0919   ALT 88  (H) 11/26/2021 0919   ALKPHOS 79 11/26/2021 0919   BILITOT 0.4 11/26/2021 0919      Component Value Date/Time   TSH 2.550 12/08/2017 1306   Nutritional Lab Results  Component Value Date   VD25OH 34.5 11/26/2021   VD25OH 42.3 02/09/2021   VD25OH 46.6 04/08/2020     Assessment and Plan Assessment & Plan Obesity Obesity management is ongoing with dietary modifications and physical activity. She alternates between a category two eating plan and a journaling plan with 1200 calories and 80 grams of protein, adhering 80% of the time. She has lost 5 pounds in the last month, with 3 pounds attributed to fat loss and 2 pounds to water loss. Emphasis is on maintaining muscle mass to prevent metabolic slowdown. Engaging in physical therapy and plans to start deep water aerobics to support weight loss and joint health. - Continue category two eating plan and journaling plan - Encourage physical therapy and deep water aerobics - Monitor weight loss to ensure it does not exceed 5 pounds per month - Ensure adequate protein intake to maintain muscle mass  Type 2 diabetes mellitus Type 2 diabetes is managed with Mounjaro , with a recent hemoglobin A1c of 6.4%. She experiences headaches and fatigue, possibly related to blood pressure fluctuations. Alternating doses of Mounjaro  are advised to manage side effects while maintaining glycemic control. Discussed potential for gastroparesis with inappropriate use of similar medications, emphasizing careful management. - Continue Mounjaro  with alternating doses of 10 mg and 12.5 mg - Monitor blood glucose levels regularly - Ensure adequate hydration and nutrition  Vitamin D  deficiency Vitamin D  deficiency is managed with ongoing supplementation. - Refill prescription vitamin D   Left knee pain Left knee pain is managed with physical therapy, improving symptoms. She does not experience significant pain and does not believe surgery is necessary. Weight loss  and strengthening exercises are important to reduce joint pressure. A knee injection is an option if pain worsens before travel. - Continue physical therapy - Consider knee injection if pain worsens before travel  Anxiety Anxiety is managed with therapy, which she finds helpful in maintaining motivation. She reports feeling more free and motivated since retirement and engages in activities that distract her from food and improve her mood. - Continue therapy sessions - Continue to monitor for increased emotional eating behaviors  Hypertension Hypertension is managed with hydrochlorothiazide and verapamil . She experiences occasional headaches and neck pain, possibly related to blood pressure fluctuations. Monitoring blood pressure and adjusting medication timing as needed is advised. - Continue hydrochlorothiazide and verapamil  - Monitor blood pressure regularly     Elveta was informed of the importance of frequent follow up visits to maximize her success with intensive lifestyle modifications for her obesity and obesity related health conditions as recommended by USPSTF and CMS guidelines   Louann Penton, MD

## 2023-12-14 ENCOUNTER — Ambulatory Visit: Admitting: Psychiatry

## 2023-12-14 DIAGNOSIS — F411 Generalized anxiety disorder: Secondary | ICD-10-CM | POA: Diagnosis not present

## 2023-12-14 NOTE — Progress Notes (Signed)
 [facility] Counselor/Therapist Progress Note  Patient ID: Rhonda Aguilar, MRN: [id number],    Date: 12/14/2023  Time Spent: 53 minutes   Treatment Type: Individual Therapy  Reported Symptoms: anxiety, worrying, depression, unresolved grief with mother's death 10 yrs ago, difficulty setting limits with others and myself, feel burn-out at times   Mental Status Exam:  Appearance:   Casual and Neat     Behavior:  Appropriate, Sharing, and Motivated  Motor:  Normal  Speech/Language:   Clear and Coherent  Affect:  Anxious, depressed  Mood:  anxious and depressed  Thought process:  goal directed  Thought content:    Rumination  Sensory/Perceptual disturbances:    WNL  Orientation:  oriented to person, place, time/date, situation, day of week, month of year, year, and stated date of Oct. 22, 2025  Attention:  Good  Concentration:  Good  Memory:  WNL  Fund of knowledge:   Good  Insight:    Good  Judgment:   Good  Impulse Control:  Good   Risk Assessment: Danger to Self:  No Self-injurious Behavior: No Danger to Others: No Duty to Warn:no Physical Aggression / Violence:No  Access to Firearms a concern: No  Gang Involvement:No   Subjective:   Patient in session today continuing her work on depression, unresolved grief, anxiety, and difficulty setting limits with other people.  She shared last visit that she has started a hobby of making pot holders and felt that was bringing some fine and creativity back into her life. Worrying increased. Worries especially about niece and nephew and shared her concerns about some of their decisions and judgment. Nephew ---Pt questions his decisions and judgement. Niece (anxiety) not making sound decisions. Does continue liking her extra free time that she is not now working after having retired. Today more upset about continued political concerns and talking through some of her frustrations today, which is understandable but definitely needing  to set healthier limits for herself. Needing to set limits with brother, niece, nephew, and mother and discussed this in more detail with patient, discussing specific ways of setting healthier limits. Friends do tend to influence her and easily manipulated her if patient does not set healthier boundaries.  More intentional work on boundaries and setting healthier limits within family.  Continue to work on decreasing her people pleasing behaviors and being able to say no when she needs to, and feel more confident as she sets healthier limits with others and provides better self-care for herself.  (Not all details included in this note due to patient privacy needs.)   Interventions: Cognitive Behavioral Therapy, Solution-Oriented/Positive Psychology, and Ego-Supportive 1.  Work on unresolved grief issues from the past particularly regarding her mother's death, and identify life conflicts and/or situations including from the past and the present, that support current symptomology of anxiety/depression. 2.  Develop behavioral and cognitive strategies to reduce or eliminate excessive anxiety, depression.  Identify, challenge, and replace negative self-talk with more positive, realistic, and empowering self-talk. 3.  Patient will work on developing reality based, positive cognitive messages that can help her improve her mood and her outlook while also working on Teaching laboratory technician.   We also discussed 3 broad areas that she wants to also add with the goals above: 1.  I also want to be able to stand up for myself without worrying about others when they make me feel bad although not intentionally. 2. I want to be able to say no and that is  hard for me.  Hard for me to set clear boundaries.  Easily pushed by others. 3.  I am struggling some with retirement and need to be able to talk through this some in counseling.   Diagnosis:   ICD-10-CM   1. Generalized anxiety disorder  F41.1       Plan:     Patient today continues working on her self-esteem, self-awareness, not giving away her power to others and allowing herself to be manipulated, and poor boundaries.  Working further today on the need for clear boundaries particularly with certain people in her life where this has been a repeated issue.  Having healthier boundaries has been very difficult for patient to confront and work on changing however is showing more desire to change even though it is difficult for her.  Frequently has noticeable difficulty staying on track in sessions and particularly when discussing challenging issues.  Shares again today about how her faith and prayer life are very important to her and feels that helps support her in the midst of stress anxiety and trying to make needed changes.  Working on better decision making.  Has good insight but difficult to follow through in making better choices and exercising healthier boundaries.  Committed to her treatment goals however does acknowledge that taking next steps and different steps can be challenging for her.  (Additional homework added between now and next session).  Goal review and progress/challenges noted with patient.  Next appointment within 2 to 3 weeks.   Barnie Bunde, LCSW

## 2023-12-15 ENCOUNTER — Ambulatory Visit: Admitting: Physical Therapy

## 2023-12-15 ENCOUNTER — Encounter: Payer: Self-pay | Admitting: Physical Therapy

## 2023-12-15 DIAGNOSIS — M25561 Pain in right knee: Secondary | ICD-10-CM

## 2023-12-15 DIAGNOSIS — M5459 Other low back pain: Secondary | ICD-10-CM

## 2023-12-15 DIAGNOSIS — M25562 Pain in left knee: Secondary | ICD-10-CM | POA: Diagnosis not present

## 2023-12-15 NOTE — Therapy (Signed)
 OUTPATIENT PHYSICAL THERAPY LOWER EXTREMITY TREATMENT   Patient Name: Rhonda Aguilar MRN: [id number] DOB:Apr 12, 1955, 68 y.o., female Today's Date: 12/15/2023   END OF SESSION:  PT End of Session - 12/15/23 1015     Visit Number 3    Number of Visits 16    Date for Recertification  01/31/24    Authorization Type [employer]    PT Start Time 1016    PT Stop Time 1056    PT Time Calculation (min) 40 min    Activity Tolerance Patient tolerated treatment well    Behavior During Therapy [facility] for tasks assessed/performed          Past Medical History:  Diagnosis Date   Anxiety    Arthritis    Benign essential HTN 11/22/2014   Cervical lymphadenopathy 06/01/2022   Diabetes mellitus, type II ([name])    Dyspnea    On exertion   Fatty liver    GERD (gastroesophageal reflux disease)    Hypertension    IBS (irritable bowel syndrome)    Migraine    Obesity (BMI 30-39.9) 11/22/2014   OSA (obstructive sleep apnea)    Pneumonia    Past Surgical History:  Procedure Laterality Date   BREAST BIOPSY Right 2014   CHOLECYSTECTOMY N/A 09/14/2019   Procedure: LAPAROSCOPIC CHOLECYSTECTOMY;  Surgeon: Signe Mitzie LABOR, MD;  Location: [facility];  Service: General;  Laterality: N/A;   COLONOSCOPY     TONSILLECTOMY     Patient Active Problem List   Diagnosis Date Noted   Grief 06/01/2023   Seasonal affective disorder 03/30/2023   Anxiety 02/21/2023   Cervical lymphadenopathy 06/01/2022   BMI 40.0-44.9, adult ([name]) 04/21/2022   Stress 04/21/2022   Obesity, Beginning BMI 42.25 04/21/2022   Constipation 02/23/2022   Hypertension associated with type 2 diabetes mellitus ([name]) 12/07/2021   Diabetes mellitus ([name]) 12/07/2021   Hyperlipidemia associated with type 2 diabetes mellitus ([name]) 12/07/2021   SOBOE (shortness of breath on exertion) 02/09/2021   Type 2 diabetes mellitus with hyperlipidemia ([name]) 02/09/2021   Vitamin D  deficiency 04/12/2018   Metabolic dysfunction-associated steatotic liver  disease (MASLD) 03/22/2018   Depression 03/22/2018   Intractable hemiplegic migraine with status migrainosus 01/17/2018   Intractable migraine with aura with status migrainosus 01/17/2018   Ocular migraine 12/12/2017   Class 3 severe obesity with serious comorbidity and body mass index (BMI) of 40.0 to 44.9 in adult ([name]) 12/12/2017   Other fatigue 12/08/2017   Shortness of breath on exertion 12/08/2017   Type 2 diabetes mellitus without complication, without long-term current use of insulin  ([name]) 12/08/2017   OSA on CPAP 12/08/2017   Low HDL (under 40) 09/10/2016   Metabolic syndrome 09/10/2016   Primary hypertension 02/05/2016   Gastroesophageal reflux disease 02/05/2016   Other hyperlipidemia 02/05/2016   DOE (dyspnea on exertion) 11/22/2014   Benign essential HTN 11/22/2014   OSA (obstructive sleep apnea)     PCP: Lamarr Rotunda  REFERRING PROVIDER: Artist Lloyd   REFERRING DIAG: Bil knee pain, low back pain   THERAPY DIAG:  Acute pain of left knee  Acute pain of right knee  Other low back pain  Rationale for Evaluation and Treatment: Rehabilitation  ONSET DATE:    SUBJECTIVE:   SUBJECTIVE STATEMENT:  L hip and knee a little sore today,   Eval: Pt states Bil knee pain and Low back pain - comes and goes  Has stationary bike Was squatting and cleaning more recently, then went up stairs-  knee with  increased pain, felt like it was going to give out.   Most pain felt with standing- in knees and back, Walking not as bad.  L knee was hurting but has improved some. she wants to be more active in prep for trip in May. Did sign up for water aerobics.  Pt is retired Engineer, civil (consulting).  L hip pain comes and goes with standing, shopping   PERTINENT HISTORY: HTN, DM 2, SOB with exertion (more with bending over) -has negative cardiac tests.   PAIN:  Are you having pain? Yes: NPRS scale: 3-4 up to 8 at night/ 10 Pain location: L knee  Pain description: sore Aggravating factors:  standing  Relieving factors: rest  Are you having pain? Yes: NPRS scale: 0-1 /10 Pain location: R knee  Pain description: sore Aggravating factors: standing Relieving factors: rest  Are you having pain? Yes: NPRS scale: up to 4/10 Pain location: back ,  L low back pain  Pain description: sore Aggravating factors: standing  Relieving factors: rest  PRECAUTIONS: None  WEIGHT BEARING RESTRICTIONS: No  FALLS:  Has patient fallen in last 6 months? No   PLOF: Independent  PATIENT GOALS:  Decreased pain in knees, back, increase exercise.   NEXT MD VISIT:   OBJECTIVE:   DIAGNOSTIC FINDINGS: 1. Osteopenia and degenerative change without evidence of fractures. 2. Mild lumbar levorotary scoliosis. 3. New mild grade 1 anterolisthesis at L5-S1, most probably due to disproportionate facet hypertrophy at this level. 4. Mild features of tricompartmental degenerative arthrosis of the right knee with small suprapatellar bursal effusion. 5. Progressive bone-on-bone joint space loss along the medial femorotibial joint of the left knee with reactive osteophytes. 6. Small suprapatellar bursal effusion of the left knee.   PATIENT SURVEYS:    THE PATIENT SPECIFIC FUNCTIONAL SCALE  Place score of 0-10 (0 = unable to perform activity and 10 = able to perform activity at the same level as before injury or problem)  Activity Date: 12/06/23     Getting up/down from floor        3    2.   Up/down stairs without hand support        2    3.   Standing 30 min        0         Total Score 1.66      Total Score = Sum of activity scores/number of activities  Minimally Detectable Change: 3 points (for single activity); 2 points (for average score)  [facility] (nd). The Patient Specific Functional Scale . Retrieved from [email or web address]   COGNITION: Overall cognitive status: Within functional limits for tasks  assessed     SENSATION: WFL  EDEMA: n/a   POSTURE:    No Significant postural limitations  PALPATION: Tenderness in L low back, into SI and some into glute Mild tenderness at L medial knee.    LOWER EXTREMITY ROM: Knees: WFL Hips: WFL Lumbar: mild limitation for SB and rotation.   LOWER EXTREMITY MMT:  MMT Left eval Right  eval  Hip flexion 4 4  Hip extension    Hip abduction 4+ 4+  Hip adduction    Hip internal rotation    Hip external rotation    Knee flexion 4 4  Knee extension 4 4  Ankle dorsiflexion    Ankle plantarflexion    Ankle inversion    Ankle eversion     (Blank rows = not tested)  LOWER EXTREMITY SPECIAL TESTS:  GAIT: Comments: unremarkable    TODAY'S TREATMENT:                                                                                                                              DATE:   12/15/2023 Therapeutic Exercise: Aerobic: Supine:  pelvic tilts x 15,    SLR 2 x 10 bil with TA ;  Seated:   Standing:   Stretches: SKTC x 3 bil;  Neuromuscular Re-education: Side stepping at counter 10 ft x 8;   Toe taps on 6 in step holding 8lb weight  Marching x 20 cueing for back and trunk position Manual Therapy: Therapeutic Activity: Stairs, hold 8lb unilateral/ no UE support , 5 steps x 5;  sit to stand x 10, then x 5 - focus on breathing.  Self Care:   Previous:  Therapeutic Exercise: Aerobic: Supine:  pelvic tilts x 15,  SKTC 20 sec x 3 bil;   Hip ER rom x 15 bil;  Seated:  LAQ 2.5 lb x 2 x 10 bil;     sit to stand 3 x 5  Standing:   heel raises x 15,  Marching x 20;  Stretches:  Neuromuscular Re-education: Manual Therapy: Therapeutic Activity: Self Care:  Eval:HEP- will issue next visit   PATIENT EDUCATION:  Education details: reviewed and issued initial HEP Person educated: Patient Education method: Explanation, Demonstration, Tactile cues, Verbal cues, and Handouts Education comprehension: verbalized understanding,  returned demonstration, verbal cues required, tactile cues required, and needs further education   HOME EXERCISE PROGRAM: [id number]   ASSESSMENT:  CLINICAL IMPRESSION: Pt with good tolerance for progressive ther ex and strength today, without increased pain. She does fatigue with activity, but did well with intermittent rest breaks. Education throughout on breathing for improving endurance and shortness of breath with exertion. Pt to benefit from continued strengthening as able.    Eval: Patient presents with primary complaint of  pain in knees and back. Pt overall deconditioned and will benefit from progressing into exercise routine for  improving strength and endurance. She has worsening OA in L knee per imaging, but pain only mild at this time. She has decreased confidence with stairs and dynamic activity due to instability and previous pain. Pt with lack of effective HEP for ongoing pain in back and knees . Pt with decreased ability for full functional activities. Pt will  benefit from skilled PT to improve deficits and pain and to return to PLOF.   OBJECTIVE IMPAIRMENTS: decreased activity tolerance, decreased mobility, decreased strength, increased muscle spasms, improper body mechanics, and pain.   ACTIVITY LIMITATIONS: carrying, lifting, standing, squatting, stairs, and locomotion level  PARTICIPATION LIMITATIONS: meal prep, cleaning, laundry, driving, shopping, community activity, and yard work  PERSONAL FACTORS: Time since onset of injury/illness/exacerbation are also affecting patient's functional outcome.   REHAB POTENTIAL: Good  CLINICAL DECISION MAKING: Stable/uncomplicated  EVALUATION COMPLEXITY: Low   GOALS: Goals reviewed with patient? Yes  SHORT TERM GOALS:  Target date: 12/27/2023  Pt to be independent with initial HEP  Goal status: INITIAL  2.  Pt to demo optimal mechanics for bend/squat for IADLS.   Goal status: INITIAL   LONG TERM GOALS: Target date:  01/31/2024   Pt to be independent with final HEP  Goal status: INITIAL  2.  Pt to demo ability for stairs with reciprocal gait and 1 hand rail or less, to improve ability for community activity.   Goal status: INITIAL  3.  Pt to report decreased pain in knees and back to 0-3/10 with standing activity for at least 30 min.   Goal status: INITIAL  4.  Pt to demo strength of knees and hips to be at least 4+/5, to improve stability and pain.   Goal status: INITIAL  5.  Pt to be complaint with walking program, up to 30 min, 3x/wk to improve endurance and stamina  Goal status: INITIAL     PLAN:  PT FREQUENCY: 1-2x/week  PT DURATION: 8 weeks  PLANNED INTERVENTIONS: Therapeutic exercises, Therapeutic activity, Neuromuscular re-education, Patient/Family education, Self Care, Joint mobilization, Joint manipulation, Stair training, Orthotic/Fit training, DME instructions, Aquatic Therapy, Dry Needling, Electrical stimulation, Cryotherapy, Moist heat, Taping, Ultrasound, Ionotophoresis 4mg /ml Dexamethasone , Manual therapy,  Vasopneumatic device, Traction, Spinal manipulation, Spinal mobilization,Balance training, Gait training,   PLAN FOR NEXT SESSION:    picking up bags of dirt- to move-- carries, weighted carries. Mulch, etc.  Confidence with activity, bil knee strenth,  Next visit: low back, hip mobility, knee am mobility, - overall strength.   Progress strength, sit to stands, breathing, stairs.    Tinnie Don, PT, DPT 12:12 PM  12/15/23

## 2023-12-20 ENCOUNTER — Ambulatory Visit: Admitting: Physical Therapy

## 2023-12-20 ENCOUNTER — Encounter: Payer: Self-pay | Admitting: Physical Therapy

## 2023-12-20 DIAGNOSIS — M5459 Other low back pain: Secondary | ICD-10-CM | POA: Diagnosis not present

## 2023-12-20 DIAGNOSIS — M25562 Pain in left knee: Secondary | ICD-10-CM

## 2023-12-20 DIAGNOSIS — M25561 Pain in right knee: Secondary | ICD-10-CM | POA: Diagnosis not present

## 2023-12-20 NOTE — Therapy (Signed)
 OUTPATIENT PHYSICAL THERAPY LOWER EXTREMITY TREATMENT   Patient Name: Rhonda Aguilar MRN: [id number] DOB:1955/07/30, 68 y.o., female Today's Date: 12/20/2023   END OF SESSION:  PT End of Session - 12/20/23 0852     Visit Number 4    Number of Visits 16    Date for Recertification  01/31/24    Authorization Type UHC  10 v through 02/28/24    PT Start Time 0849    PT Stop Time 0930    PT Time Calculation (min) 41 min    Activity Tolerance Patient tolerated treatment well    Behavior During Therapy [facility] for tasks assessed/performed          Past Medical History:  Diagnosis Date   Anxiety    Arthritis    Benign essential HTN 11/22/2014   Cervical lymphadenopathy 06/01/2022   Diabetes mellitus, type II ([name])    Dyspnea    On exertion   Fatty liver    GERD (gastroesophageal reflux disease)    Hypertension    IBS (irritable bowel syndrome)    Migraine    Obesity (BMI 30-39.9) 11/22/2014   OSA (obstructive sleep apnea)    Pneumonia    Past Surgical History:  Procedure Laterality Date   BREAST BIOPSY Right 2014   CHOLECYSTECTOMY N/A 09/14/2019   Procedure: LAPAROSCOPIC CHOLECYSTECTOMY;  Surgeon: Signe Mitzie LABOR, MD;  Location: [facility];  Service: General;  Laterality: N/A;   COLONOSCOPY     TONSILLECTOMY     Patient Active Problem List   Diagnosis Date Noted   Grief 06/01/2023   Seasonal affective disorder 03/30/2023   Anxiety 02/21/2023   Cervical lymphadenopathy 06/01/2022   BMI 40.0-44.9, adult ([name]) 04/21/2022   Stress 04/21/2022   Obesity, Beginning BMI 42.25 04/21/2022   Constipation 02/23/2022   Hypertension associated with type 2 diabetes mellitus ([name]) 12/07/2021   Diabetes mellitus ([name]) 12/07/2021   Hyperlipidemia associated with type 2 diabetes mellitus ([name]) 12/07/2021   SOBOE (shortness of breath on exertion) 02/09/2021   Type 2 diabetes mellitus with hyperlipidemia ([name]) 02/09/2021   Vitamin D  deficiency 04/12/2018   Metabolic  dysfunction-associated steatotic liver disease (MASLD) 03/22/2018   Depression 03/22/2018   Intractable hemiplegic migraine with status migrainosus 01/17/2018   Intractable migraine with aura with status migrainosus 01/17/2018   Ocular migraine 12/12/2017   Class 3 severe obesity with serious comorbidity and body mass index (BMI) of 40.0 to 44.9 in adult ([name]) 12/12/2017   Other fatigue 12/08/2017   Shortness of breath on exertion 12/08/2017   Type 2 diabetes mellitus without complication, without long-term current use of insulin  ([name]) 12/08/2017   OSA on CPAP 12/08/2017   Low HDL (under 40) 09/10/2016   Metabolic syndrome 09/10/2016   Primary hypertension 02/05/2016   Gastroesophageal reflux disease 02/05/2016   Other hyperlipidemia 02/05/2016   DOE (dyspnea on exertion) 11/22/2014   Benign essential HTN 11/22/2014   OSA (obstructive sleep apnea)     PCP: Lamarr Rotunda  REFERRING PROVIDER: Artist Lloyd   REFERRING DIAG: Bil knee pain, low back pain   THERAPY DIAG:  Acute pain of left knee  Acute pain of right knee  Other low back pain  Rationale for Evaluation and Treatment: Rehabilitation  ONSET DATE:    SUBJECTIVE:   SUBJECTIVE STATEMENT:  Pt states soreness in L side of low back/SI with standing/cleaning.   Eval: Pt states Bil knee pain and Low back pain - comes and goes  Has stationary bike Was squatting and cleaning more  recently, then went up stairs-  knee with increased pain, felt like it was going to give out.   Most pain felt with standing- in knees and back, Walking not as bad.  L knee was hurting but has improved some. she wants to be more active in prep for trip in May. Did sign up for water aerobics.  Pt is retired engineer, civil (consulting).  L hip pain comes and goes with standing, shopping   PERTINENT HISTORY: HTN, DM 2, SOB with exertion (more with bending over) -has negative cardiac tests.   PAIN:  Are you having pain? Yes: NPRS scale: 3-4 up to 8 at night/  10 Pain location: L knee  Pain description: sore Aggravating factors: standing  Relieving factors: rest  Are you having pain? Yes: NPRS scale: 0-1 /10 Pain location: R knee  Pain description: sore Aggravating factors: standing Relieving factors: rest  Are you having pain? Yes: NPRS scale: up to 4/10 Pain location: back ,  L low back pain  Pain description: sore Aggravating factors: standing  Relieving factors: rest  PRECAUTIONS: None  WEIGHT BEARING RESTRICTIONS: No  FALLS:  Has patient fallen in last 6 months? No   PLOF: Independent  PATIENT GOALS:  Decreased pain in knees, back, increase exercise.   NEXT MD VISIT:   OBJECTIVE:   DIAGNOSTIC FINDINGS: 1. Osteopenia and degenerative change without evidence of fractures. 2. Mild lumbar levorotary scoliosis. 3. New mild grade 1 anterolisthesis at L5-S1, most probably due to disproportionate facet hypertrophy at this level. 4. Mild features of tricompartmental degenerative arthrosis of the right knee with small suprapatellar bursal effusion. 5. Progressive bone-on-bone joint space loss along the medial femorotibial joint of the left knee with reactive osteophytes. 6. Small suprapatellar bursal effusion of the left knee.   PATIENT SURVEYS:    THE PATIENT SPECIFIC FUNCTIONAL SCALE  Place score of 0-10 (0 = unable to perform activity and 10 = able to perform activity at the same level as before injury or problem)  Activity Date: 12/06/23     Getting up/down from floor        3    2.   Up/down stairs without hand support        2    3.   Standing 30 min        0         Total Score 1.66      Total Score = Sum of activity scores/number of activities  Minimally Detectable Change: 3 points (for single activity); 2 points (for average score)  [facility] (nd). The Patient Specific Functional Scale . Retrieved from  [email or web address]   COGNITION: Overall cognitive status: Within functional limits for tasks assessed     SENSATION: WFL  EDEMA: n/a   POSTURE:    No Significant postural limitations  PALPATION: Tenderness in L low back, into SI and some into glute Mild tenderness at L medial knee.    LOWER EXTREMITY ROM: Knees: WFL Hips: WFL Lumbar: mild limitation for SB and rotation.   LOWER EXTREMITY MMT:  MMT Left eval Right  eval  Hip flexion 4 4  Hip extension    Hip abduction 4+ 4+  Hip adduction    Hip internal rotation    Hip external rotation    Knee flexion 4 4  Knee extension 4 4  Ankle dorsiflexion    Ankle plantarflexion    Ankle inversion    Ankle eversion     (Blank rows = not  tested)  LOWER EXTREMITY SPECIAL TESTS:    GAIT: Comments: unremarkable    TODAY'S TREATMENT:                                                                                                                              DATE:   12/20/2023 Therapeutic Exercise: Aerobic: Supine:  pelvic tilts x 15,    SLR 2 x 10 bil with TA ;  bridging x 10;  Seated:   Standing:      Hip abd 2 x 10 bil with attention to back posture   Stretches: SKTC x 3 bil;  seated HSS x 4 on L;  Seated fwd flexion x 3 ; Cat/cow 2 x 8 ; Neuromuscular Re-education: Manual Therapy: LAD for low back  Therapeutic Activity: Slow march x 20 with cueing for back position  Rows Blue Tb x 20 - cueing for back position Review of standing posture with UE activity/ at counter, and with bending/loading dishwasher- to turn feet vs twist back;   Self Care:   Previous:  Therapeutic Exercise: Aerobic: Supine:  pelvic tilts x 15,  SKTC 20 sec x 3 bil;   Hip ER rom x 15 bil;  Seated:  LAQ 2.5 lb x 2 x 10 bil;     sit to stand 3 x 5  Standing:   heel raises x 15,  Marching x 20;  Stretches:  Neuromuscular Re-education: Manual Therapy: Therapeutic Activity: Self  Care:  Eval:HEP- will issue next visit   PATIENT EDUCATION:  Education details: reviewed and issued initial HEP Person educated: Patient Education method: Explanation, Demonstration, Tactile cues, Verbal cues, and Handouts Education comprehension: verbalized understanding, returned demonstration, verbal cues required, tactile cues required, and needs further education   HOME EXERCISE PROGRAM: [email or web address]   ASSESSMENT:  CLINICAL IMPRESSION: Reviewed standing posture with mechanics for pain with standing activities at home. Reviewed mobility for back when it is painful. 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   Eval: Patient presents with primary complaint of  pain in knees and back. Pt overall deconditioned and will benefit from progressing into exercise routine for  improving strength and endurance. She has worsening OA in L knee per imaging, but pain only mild at this time. She has decreased confidence with stairs and dynamic activity due to instability and previous pain. Pt with lack of effective HEP for ongoing pain in back and knees . Pt with decreased ability for full functional activities. Pt will  benefit from skilled PT to improve deficits and pain and to return to PLOF.   OBJECTIVE IMPAIRMENTS: decreased activity tolerance, decreased mobility, decreased strength, increased muscle spasms, improper body mechanics, and pain.   ACTIVITY LIMITATIONS: carrying, lifting, standing, squatting, stairs, and locomotion level  PARTICIPATION LIMITATIONS: meal prep, cleaning, laundry, driving, shopping, community activity, and yard work  PERSONAL FACTORS: Time since onset of injury/illness/exacerbation are also affecting patient's functional outcome.   REHAB  POTENTIAL: Good  CLINICAL DECISION MAKING: Stable/uncomplicated  EVALUATION COMPLEXITY: Low   GOALS: Goals reviewed with patient? Yes  SHORT TERM GOALS: Target date: 12/27/2023  Pt to be  independent with initial HEP  Goal status: INITIAL  2.  Pt to demo optimal mechanics for bend/squat for IADLS.   Goal status: INITIAL   LONG TERM GOALS: Target date: 01/31/2024   Pt to be independent with final HEP  Goal status: INITIAL  2.  Pt to demo ability for stairs with reciprocal gait and 1 hand rail or less, to improve ability for community activity.   Goal status: INITIAL  3.  Pt to report decreased pain in knees and back to 0-3/10 with standing activity for at least 30 min.   Goal status: INITIAL  4.  Pt to demo strength of knees and hips to be at least 4+/5, to improve stability and pain.   Goal status: INITIAL  5.  Pt to be complaint with walking program, up to 30 min, 3x/wk to improve endurance and stamina  Goal status: INITIAL     PLAN:  PT FREQUENCY: 1-2x/week  PT DURATION: 8 weeks  PLANNED INTERVENTIONS: Therapeutic exercises, Therapeutic activity, Neuromuscular re-education, Patient/Family education, Self Care, Joint mobilization, Joint manipulation, Stair training, Orthotic/Fit training, DME instructions, Aquatic Therapy, Dry Needling, Electrical stimulation, Cryotherapy, Moist heat, Taping, Ultrasound, Ionotophoresis 4mg /ml Dexamethasone , Manual therapy,  Vasopneumatic device, Traction, Spinal manipulation, Spinal mobilization,Balance training, Gait training,   PLAN FOR NEXT SESSION:    picking up bags of dirt- to move-- carries, weighted carries. Mulch, etc.  Confidence with activity, bil knee strenth,  Next visit: low back, hip mobility, knee am mobility, - overall strength.   Progress strength, sit to stands, breathing, stairs.    Tinnie Don, PT, DPT 12:29 PM  12/20/23

## 2023-12-22 ENCOUNTER — Ambulatory Visit: Admitting: Physical Therapy

## 2023-12-22 ENCOUNTER — Encounter: Payer: Self-pay | Admitting: Physical Therapy

## 2023-12-22 DIAGNOSIS — M25562 Pain in left knee: Secondary | ICD-10-CM

## 2023-12-22 DIAGNOSIS — M25561 Pain in right knee: Secondary | ICD-10-CM

## 2023-12-22 DIAGNOSIS — M5459 Other low back pain: Secondary | ICD-10-CM

## 2023-12-22 NOTE — Therapy (Signed)
 OUTPATIENT PHYSICAL THERAPY LOWER EXTREMITY TREATMENT   Patient Name: KAIDYNCE PFISTER MRN: [id number] DOB:1956/02/20, 68 y.o., female Today's Date: 12/22/2023   END OF SESSION:  PT End of Session - 12/22/23 2125     Visit Number 5    Number of Visits 16    Date for Recertification  01/31/24    Authorization Type UHC  10 v through 02/28/24    PT Start Time 1303    PT Stop Time 1344    PT Time Calculation (min) 41 min    Activity Tolerance Patient tolerated treatment well    Behavior During Therapy [facility] for tasks assessed/performed           Past Medical History:  Diagnosis Date   Anxiety    Arthritis    Benign essential HTN 11/22/2014   Cervical lymphadenopathy 06/01/2022   Diabetes mellitus, type II ([name])    Dyspnea    On exertion   Fatty liver    GERD (gastroesophageal reflux disease)    Hypertension    IBS (irritable bowel syndrome)    Migraine    Obesity (BMI 30-39.9) 11/22/2014   OSA (obstructive sleep apnea)    Pneumonia    Past Surgical History:  Procedure Laterality Date   BREAST BIOPSY Right 2014   CHOLECYSTECTOMY N/A 09/14/2019   Procedure: LAPAROSCOPIC CHOLECYSTECTOMY;  Surgeon: Signe Mitzie LABOR, MD;  Location: [facility];  Service: General;  Laterality: N/A;   COLONOSCOPY     TONSILLECTOMY     Patient Active Problem List   Diagnosis Date Noted   Grief 06/01/2023   Seasonal affective disorder 03/30/2023   Anxiety 02/21/2023   Cervical lymphadenopathy 06/01/2022   BMI 40.0-44.9, adult ([name]) 04/21/2022   Stress 04/21/2022   Obesity, Beginning BMI 42.25 04/21/2022   Constipation 02/23/2022   Hypertension associated with type 2 diabetes mellitus ([name]) 12/07/2021   Diabetes mellitus ([name]) 12/07/2021   Hyperlipidemia associated with type 2 diabetes mellitus ([name]) 12/07/2021   SOBOE (shortness of breath on exertion) 02/09/2021   Type 2 diabetes mellitus with hyperlipidemia ([name]) 02/09/2021   Vitamin D  deficiency 04/12/2018   Metabolic  dysfunction-associated steatotic liver disease (MASLD) 03/22/2018   Depression 03/22/2018   Intractable hemiplegic migraine with status migrainosus 01/17/2018   Intractable migraine with aura with status migrainosus 01/17/2018   Ocular migraine 12/12/2017   Class 3 severe obesity with serious comorbidity and body mass index (BMI) of 40.0 to 44.9 in adult ([name]) 12/12/2017   Other fatigue 12/08/2017   Shortness of breath on exertion 12/08/2017   Type 2 diabetes mellitus without complication, without long-term current use of insulin  ([name]) 12/08/2017   OSA on CPAP 12/08/2017   Low HDL (under 40) 09/10/2016   Metabolic syndrome 09/10/2016   Primary hypertension 02/05/2016   Gastroesophageal reflux disease 02/05/2016   Other hyperlipidemia 02/05/2016   DOE (dyspnea on exertion) 11/22/2014   Benign essential HTN 11/22/2014   OSA (obstructive sleep apnea)     PCP: Lamarr Rotunda  REFERRING PROVIDER: Artist Lloyd   REFERRING DIAG: Bil knee pain, low back pain   THERAPY DIAG:  Acute pain of left knee  Acute pain of right knee  Other low back pain  Rationale for Evaluation and Treatment: Rehabilitation  ONSET DATE:    SUBJECTIVE:   SUBJECTIVE STATEMENT:   Pt states less soreness in L side of low back/SI today. She did obtain a different crafting table that is higher for better body mechanics.   Eval: Pt states Bil knee pain and  Low back pain - comes and goes  Has stationary bike Was squatting and cleaning more recently, then went up stairs-  knee with increased pain, felt like it was going to give out.   Most pain felt with standing- in knees and back, Walking not as bad.  L knee was hurting but has improved some. she wants to be more active in prep for trip in May. Did sign up for water aerobics.  Pt is retired engineer, civil (consulting).  L hip pain comes and goes with standing, shopping   PERTINENT HISTORY: HTN, DM 2, SOB with exertion (more with bending over) -has negative cardiac tests.    PAIN:  Are you having pain? Yes: NPRS scale: 3-4 up to 8 at night/ 10 Pain location: L knee  Pain description: sore Aggravating factors: standing  Relieving factors: rest  Are you having pain? Yes: NPRS scale: 0-1 /10 Pain location: R knee  Pain description: sore Aggravating factors: standing Relieving factors: rest  Are you having pain? Yes: NPRS scale: up to 4/10 Pain location: back ,  L low back pain  Pain description: sore Aggravating factors: standing  Relieving factors: rest  PRECAUTIONS: None  WEIGHT BEARING RESTRICTIONS: No  FALLS:  Has patient fallen in last 6 months? No   PLOF: Independent  PATIENT GOALS:  Decreased pain in knees, back, increase exercise.   NEXT MD VISIT:   OBJECTIVE:   DIAGNOSTIC FINDINGS: 1. Osteopenia and degenerative change without evidence of fractures. 2. Mild lumbar levorotary scoliosis. 3. New mild grade 1 anterolisthesis at L5-S1, most probably due to disproportionate facet hypertrophy at this level. 4. Mild features of tricompartmental degenerative arthrosis of the right knee with small suprapatellar bursal effusion. 5. Progressive bone-on-bone joint space loss along the medial femorotibial joint of the left knee with reactive osteophytes. 6. Small suprapatellar bursal effusion of the left knee.   PATIENT SURVEYS:    THE PATIENT SPECIFIC FUNCTIONAL SCALE  Place score of 0-10 (0 = unable to perform activity and 10 = able to perform activity at the same level as before injury or problem)  Activity Date: 12/06/23     Getting up/down from floor        3    2.   Up/down stairs without hand support        2    3.   Standing 30 min        0         Total Score 1.66      Total Score = Sum of activity scores/number of activities  Minimally Detectable Change: 3 points (for single activity); 2 points (for average score)  [facility] (nd). The Patient Specific Functional Scale . Retrieved from  [email or web address]   COGNITION: Overall cognitive status: Within functional limits for tasks assessed     SENSATION: WFL  EDEMA: n/a   POSTURE:    No Significant postural limitations  PALPATION: Tenderness in L low back, into SI and some into glute Mild tenderness at L medial knee.    LOWER EXTREMITY ROM: Knees: WFL Hips: WFL Lumbar: mild limitation for SB and rotation.   LOWER EXTREMITY MMT:  MMT Left eval Right  eval  Hip flexion 4 4  Hip extension    Hip abduction 4+ 4+  Hip adduction    Hip internal rotation    Hip external rotation    Knee flexion 4 4  Knee extension 4 4  Ankle dorsiflexion    Ankle plantarflexion  Ankle inversion    Ankle eversion     (Blank rows = not tested)  LOWER EXTREMITY SPECIAL TESTS:    GAIT: Comments: unremarkable    TODAY'S TREATMENT:                                                                                                                              DATE:   12/22/2023 Therapeutic Exercise: Aerobic: Supine:  pelvic tilts x 15,    SLR  x 10 bil with TA ;  bridging x 10;  Seated:   Standing:      Hip abd 2 x 10 bil with attention to back posture   Stretches: SKTC x 3 bil;    Cat/cow x 15 ; Neuromuscular Re-education: Manual Therapy: LAD for low back  Therapeutic Activity: Sit to stand mat table x 10;  Slow march x 20 with cueing for back position  Rows Blue Tb x 20 - cueing for back position Toe taps in 6 in step x 15 Step ups 6 in x 10 bil- light 1 Ue support.  Self Care:    Previous:  Therapeutic Exercise: Aerobic: Supine:  pelvic tilts x 15,  SKTC 20 sec x 3 bil;   Hip ER rom x 15 bil;  Seated:  LAQ 2.5 lb x 2 x 10 bil;     sit to stand 3 x 5  Standing:   heel raises x 15,  Marching x 20;  Stretches:  Neuromuscular Re-education: Manual Therapy: Therapeutic Activity: Self Care:  Eval:HEP- will issue next visit   PATIENT EDUCATION:   Education details: reviewed and issued initial HEP Person educated: Patient Education method: Explanation, Demonstration, Tactile cues, Verbal cues, and Handouts Education comprehension: verbalized understanding, returned demonstration, verbal cues required, tactile cues required, and needs further education   HOME EXERCISE PROGRAM: [email or web address]   ASSESSMENT:  CLINICAL IMPRESSION: Pt able to tolerate progressive standing ther ex, with intermittent rest breaks. Discussed ways to get/keep heart rate up with LE strength/functional activities and HEP. Also discussed options for return to gym or classes that would be motivating to her.  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   Eval: Patient presents with primary complaint of  pain in knees and back. Pt overall deconditioned and will benefit from progressing into exercise routine for  improving strength and endurance. She has worsening OA in L knee per imaging, but pain only mild at this time. She has decreased confidence with stairs and dynamic activity due to instability and previous pain. Pt with lack of effective HEP for ongoing pain in back and knees . Pt with decreased ability for full functional activities. Pt will  benefit from skilled PT to improve deficits and pain and to return to PLOF.   OBJECTIVE IMPAIRMENTS: decreased activity tolerance, decreased mobility, decreased strength, increased muscle spasms, improper body mechanics, and pain.   ACTIVITY LIMITATIONS: carrying, lifting, standing, squatting, stairs, and locomotion  level  PARTICIPATION LIMITATIONS: meal prep, cleaning, laundry, driving, shopping, community activity, and yard work  PERSONAL FACTORS: Time since onset of injury/illness/exacerbation are also affecting patient's functional outcome.   REHAB POTENTIAL: Good  CLINICAL DECISION MAKING: Stable/uncomplicated  EVALUATION COMPLEXITY: Low   GOALS: Goals reviewed with patient?  Yes  SHORT TERM GOALS: Target date: 12/27/2023  Pt to be independent with initial HEP  Goal status: INITIAL  2.  Pt to demo optimal mechanics for bend/squat for IADLS.   Goal status: INITIAL   LONG TERM GOALS: Target date: 01/31/2024   Pt to be independent with final HEP  Goal status: INITIAL  2.  Pt to demo ability for stairs with reciprocal gait and 1 hand rail or less, to improve ability for community activity.   Goal status: INITIAL  3.  Pt to report decreased pain in knees and back to 0-3/10 with standing activity for at least 30 min.   Goal status: INITIAL  4.  Pt to demo strength of knees and hips to be at least 4+/5, to improve stability and pain.   Goal status: INITIAL  5.  Pt to be complaint with walking program, up to 30 min, 3x/wk to improve endurance and stamina  Goal status: INITIAL     PLAN:  PT FREQUENCY: 1-2x/week  PT DURATION: 8 weeks  PLANNED INTERVENTIONS: Therapeutic exercises, Therapeutic activity, Neuromuscular re-education, Patient/Family education, Self Care, Joint mobilization, Joint manipulation, Stair training, Orthotic/Fit training, DME instructions, Aquatic Therapy, Dry Needling, Electrical stimulation, Cryotherapy, Moist heat, Taping, Ultrasound, Ionotophoresis 4mg /ml Dexamethasone , Manual therapy,  Vasopneumatic device, Traction, Spinal manipulation, Spinal mobilization,Balance training, Gait training,   PLAN FOR NEXT SESSION:    picking up bags of dirt- to move-- carries, weighted carries. Mulch, etc.  Confidence with activity, bil knee strenth,  Next visit: low back, hip mobility, knee am mobility, - overall strength.   Progress strength, sit to stands, breathing, stairs.    Tinnie Don, PT, DPT 9:25 PM  12/22/23

## 2023-12-27 ENCOUNTER — Ambulatory Visit: Admitting: Physical Therapy

## 2023-12-27 ENCOUNTER — Encounter: Payer: Self-pay | Admitting: Physical Therapy

## 2023-12-27 DIAGNOSIS — M5459 Other low back pain: Secondary | ICD-10-CM | POA: Diagnosis not present

## 2023-12-27 DIAGNOSIS — M25561 Pain in right knee: Secondary | ICD-10-CM

## 2023-12-27 DIAGNOSIS — M25562 Pain in left knee: Secondary | ICD-10-CM | POA: Diagnosis not present

## 2023-12-27 NOTE — Therapy (Signed)
 OUTPATIENT PHYSICAL THERAPY LOWER EXTREMITY TREATMENT   Patient Name: FAYLYNN STAMOS MRN: [id number] DOB:24-Mar-1955, 68 y.o., female Today's Date: 12/27/2023   END OF SESSION:  PT End of Session - 12/27/23 1022     Visit Number 6    Number of Visits 16    Date for Recertification  01/31/24    Authorization Type [employer]  10 v through 02/28/24    PT Start Time 1023    PT Stop Time 1104    PT Time Calculation (min) 41 min    Activity Tolerance Patient tolerated treatment well    Behavior During Therapy [facility] for tasks assessed/performed            Past Medical History:  Diagnosis Date   Anxiety    Arthritis    Benign essential HTN 11/22/2014   Cervical lymphadenopathy 06/01/2022   Diabetes mellitus, type II ([name])    Dyspnea    On exertion   Fatty liver    GERD (gastroesophageal reflux disease)    Hypertension    IBS (irritable bowel syndrome)    Migraine    Obesity (BMI 30-39.9) 11/22/2014   OSA (obstructive sleep apnea)    Pneumonia    Past Surgical History:  Procedure Laterality Date   BREAST BIOPSY Right 2014   CHOLECYSTECTOMY N/A 09/14/2019   Procedure: LAPAROSCOPIC CHOLECYSTECTOMY;  Surgeon: Signe Mitzie LABOR, MD;  Location: [facility];  Service: General;  Laterality: N/A;   COLONOSCOPY     TONSILLECTOMY     Patient Active Problem List   Diagnosis Date Noted   Grief 06/01/2023   Seasonal affective disorder 03/30/2023   Anxiety 02/21/2023   Cervical lymphadenopathy 06/01/2022   BMI 40.0-44.9, adult ([name]) 04/21/2022   Stress 04/21/2022   Obesity, Beginning BMI 42.25 04/21/2022   Constipation 02/23/2022   Hypertension associated with type 2 diabetes mellitus ([name]) 12/07/2021   Diabetes mellitus ([name]) 12/07/2021   Hyperlipidemia associated with type 2 diabetes mellitus ([name]) 12/07/2021   SOBOE (shortness of breath on exertion) 02/09/2021   Type 2 diabetes mellitus with hyperlipidemia ([name]) 02/09/2021   Vitamin D  deficiency 04/12/2018   Metabolic  dysfunction-associated steatotic liver disease (MASLD) 03/22/2018   Depression 03/22/2018   Intractable hemiplegic migraine with status migrainosus 01/17/2018   Intractable migraine with aura with status migrainosus 01/17/2018   Ocular migraine 12/12/2017   Class 3 severe obesity with serious comorbidity and body mass index (BMI) of 40.0 to 44.9 in adult ([name]) 12/12/2017   Other fatigue 12/08/2017   Shortness of breath on exertion 12/08/2017   Type 2 diabetes mellitus without complication, without long-term current use of insulin  ([name]) 12/08/2017   OSA on CPAP 12/08/2017   Low HDL (under 40) 09/10/2016   Metabolic syndrome 09/10/2016   Primary hypertension 02/05/2016   Gastroesophageal reflux disease 02/05/2016   Other hyperlipidemia 02/05/2016   DOE (dyspnea on exertion) 11/22/2014   Benign essential HTN 11/22/2014   OSA (obstructive sleep apnea)     PCP: Lamarr Rotunda  REFERRING PROVIDER: Artist Lloyd   REFERRING DIAG: Bil knee pain, low back pain   THERAPY DIAG:  No diagnosis found.  Rationale for Evaluation and Treatment: Rehabilitation  ONSET DATE:    SUBJECTIVE:   SUBJECTIVE STATEMENT:   Pt with no new complaints   Eval: Pt states Bil knee pain and Low back pain - comes and goes  Has stationary bike Was squatting and cleaning more recently, then went up stairs-  knee with increased pain, felt like it was going to  give out.   Most pain felt with standing- in knees and back, Walking not as bad.  L knee was hurting but has improved some. she wants to be more active in prep for trip in May. Did sign up for water aerobics.  Pt is retired engineer, civil (consulting).  L hip pain comes and goes with standing, shopping   PERTINENT HISTORY: HTN, DM 2, SOB with exertion (more with bending over) -has negative cardiac tests.   PAIN:  Are you having pain? Yes: NPRS scale: 3-4 up to 8 at night/ 10 Pain location: L knee  Pain description: sore Aggravating factors: standing  Relieving  factors: rest  Are you having pain? Yes: NPRS scale: 0-1 /10 Pain location: R knee  Pain description: sore Aggravating factors: standing Relieving factors: rest  Are you having pain? Yes: NPRS scale: up to 4/10 Pain location: back ,  L low back pain  Pain description: sore Aggravating factors: standing  Relieving factors: rest  PRECAUTIONS: None  WEIGHT BEARING RESTRICTIONS: No  FALLS:  Has patient fallen in last 6 months? No   PLOF: Independent  PATIENT GOALS:  Decreased pain in knees, back, increase exercise.   NEXT MD VISIT:   OBJECTIVE:   DIAGNOSTIC FINDINGS: 1. Osteopenia and degenerative change without evidence of fractures. 2. Mild lumbar levorotary scoliosis. 3. New mild grade 1 anterolisthesis at L5-S1, most probably due to disproportionate facet hypertrophy at this level. 4. Mild features of tricompartmental degenerative arthrosis of the right knee with small suprapatellar bursal effusion. 5. Progressive bone-on-bone joint space loss along the medial femorotibial joint of the left knee with reactive osteophytes. 6. Small suprapatellar bursal effusion of the left knee.   PATIENT SURVEYS:    THE PATIENT SPECIFIC FUNCTIONAL SCALE  Place score of 0-10 (0 = unable to perform activity and 10 = able to perform activity at the same level as before injury or problem)  Activity Date: 12/06/23 12/27/23    Getting up/down from floor        3       8   2.   Up/down stairs without hand support        2       8   3.   Standing 30 min        0       6        Total Score 1.66 7.33     Total Score = Sum of activity scores/number of activities  Minimally Detectable Change: 3 points (for single activity); 2 points (for average score)  [facility] (nd). The Patient Specific Functional Scale . Retrieved from [email or web address]   COGNITION: Overall cognitive status: Within functional limits for tasks  assessed     SENSATION: WFL  EDEMA: n/a   POSTURE:    No Significant postural limitations  PALPATION: Tenderness in L low back, into SI and some into glute Mild tenderness at L medial knee.    LOWER EXTREMITY ROM: Knees: WFL Hips: WFL Lumbar: mild limitation for SB and rotation.   LOWER EXTREMITY MMT:  MMT Left eval Right  eval  Hip flexion 4 4  Hip extension    Hip abduction 4+ 4+  Hip adduction    Hip internal rotation    Hip external rotation    Knee flexion 4 4  Knee extension 4 4  Ankle dorsiflexion    Ankle plantarflexion    Ankle inversion    Ankle eversion     (Blank rows =  not tested)  LOWER EXTREMITY SPECIAL TESTS:    GAIT: Comments: unremarkable    TODAY'S TREATMENT:                                                                                                                              DATE:   12/27/2023 Therapeutic Exercise: Aerobic: Supine:  pelvic tilts x 15,    SLR  x 10 bil with TA ;  bridging 2 x 8;  Seated:   Standing:    HR,  hip abd,  squats,  all 1 x 10 ( 2 cycles )   Stretches:  Neuromuscular Re-education: Manual Therapy:   Therapeutic Activity: Sit to stand mat table x 10; x10 then x 5 with 8lb  Slow march x 20 bil (6 lb unilateral hold) Toe taps in 6 in step x 15 Rows BlueTB x 20 - cueing for back position Step ups 6 in x 10 bil- light 1 UE support.  Self Care:   Previous:  Therapeutic Exercise: Aerobic: Supine:  pelvic tilts x 15,  SKTC 20 sec x 3 bil;   Hip ER rom x 15 bil;  Seated:  LAQ 2.5 lb x 2 x 10 bil;     sit to stand 3 x 5  Standing:   heel raises x 15,  Marching x 20;  Stretches:  Neuromuscular Re-education: Manual Therapy: Therapeutic Activity: Self Care:  Eval:HEP- will issue next visit   PATIENT EDUCATION:  Education details: reviewed and issued initial HEP Person educated: Patient Education method: Explanation, Demonstration, Tactile cues, Verbal cues, and Handouts Education comprehension:  verbalized understanding, returned demonstration, verbal cues required, tactile cues required, and needs further education   HOME EXERCISE PROGRAM: [id number]   ASSESSMENT:  CLINICAL IMPRESSION: Pt with much improved psfs score today. She is challenged and fatigued from performing sets of exercises today, but HR stable, and recovers well with seated rest break. Pt with improving strength and ability for activity.   Pt able to tolerate progressive standing ther ex, with intermittent rest breaks. Discussed ways to get/keep heart rate up with LE strength/functional activities and HEP. Also discussed options for return to gym or classes that would be motivating to her.  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   Eval: Patient presents with primary complaint of  pain in knees and back. Pt overall deconditioned and will benefit from progressing into exercise routine for  improving strength and endurance. She has worsening OA in L knee per imaging, but pain only mild at this time. She has decreased confidence with stairs and dynamic activity due to instability and previous pain. Pt with lack of effective HEP for ongoing pain in back and knees . Pt with decreased ability for full functional activities. Pt will  benefit from skilled PT to improve deficits and pain and to return to PLOF.   OBJECTIVE IMPAIRMENTS: decreased activity tolerance, decreased mobility, decreased strength, increased muscle  spasms, improper body mechanics, and pain.   ACTIVITY LIMITATIONS: carrying, lifting, standing, squatting, stairs, and locomotion level  PARTICIPATION LIMITATIONS: meal prep, cleaning, laundry, driving, shopping, community activity, and yard work  PERSONAL FACTORS: Time since onset of injury/illness/exacerbation are also affecting patient's functional outcome.   REHAB POTENTIAL: Good  CLINICAL DECISION MAKING: Stable/uncomplicated  EVALUATION COMPLEXITY:  Low   GOALS: Goals reviewed with patient? Yes  SHORT TERM GOALS: Target date: 12/27/2023  Pt to be independent with initial HEP  Goal status: INITIAL  2.  Pt to demo optimal mechanics for bend/squat for IADLS.   Goal status: INITIAL   LONG TERM GOALS: Target date: 01/31/2024   Pt to be independent with final HEP  Goal status: INITIAL  2.  Pt to demo ability for stairs with reciprocal gait and 1 hand rail or less, to improve ability for community activity.   Goal status: INITIAL  3.  Pt to report decreased pain in knees and back to 0-3/10 with standing activity for at least 30 min.   Goal status: INITIAL  4.  Pt to demo strength of knees and hips to be at least 4+/5, to improve stability and pain.   Goal status: INITIAL  5.  Pt to be complaint with walking program, up to 30 min, 3x/wk to improve endurance and stamina  Goal status: INITIAL     PLAN:  PT FREQUENCY: 1-2x/week  PT DURATION: 8 weeks  PLANNED INTERVENTIONS: Therapeutic exercises, Therapeutic activity, Neuromuscular re-education, Patient/Family education, Self Care, Joint mobilization, Joint manipulation, Stair training, Orthotic/Fit training, DME instructions, Aquatic Therapy, Dry Needling, Electrical stimulation, Cryotherapy, Moist heat, Taping, Ultrasound, Ionotophoresis 4mg /ml Dexamethasone , Manual therapy,  Vasopneumatic device, Traction, Spinal manipulation, Spinal mobilization,Balance training, Gait training,   PLAN FOR NEXT SESSION:    picking up bags of dirt- to move-- carries, weighted carries. Mulch, etc.  Confidence with activity, bil knee strenth,  Next visit: low back, hip mobility, knee am mobility, - overall strength.   Progress strength, sit to stands, breathing, stairs.  ** core strength for back pain **   Tinnie Don, PT, DPT 11:03 AM  12/27/23

## 2023-12-28 ENCOUNTER — Ambulatory Visit: Admitting: Psychiatry

## 2023-12-28 DIAGNOSIS — F411 Generalized anxiety disorder: Secondary | ICD-10-CM | POA: Diagnosis not present

## 2023-12-28 NOTE — Progress Notes (Signed)
 [facility] Counselor/Therapist Progress Note  Patient ID: Rhonda Aguilar, MRN: [id number],    Date: 12/28/2023  Time Spent: 53 minutes   Treatment Type: Individual Therapy  Reported Symptoms: anxiety, worrying, depression, unresolved grief with mother's death 10 yrs ago, difficulty setting limits with others and myself, feeling burned-out at times   Mental Status Exam:  Appearance:   Casual     Behavior:  Appropriate, Sharing, and Motivated  Motor:  Normal  Speech/Language:   Clear and Coherent  Affect:  Depressed and anxious  Mood:  anxious and depressed  Thought process:  goal directed  Thought content:    Rumination  Sensory/Perceptual disturbances:    WNL  Orientation:  oriented to person, place, time/date, situation, day of week, month of year, year, and stated date of Nov. 5, 2025  Attention:  Good  Concentration:  Good and Fair  Memory:  WNL  Fund of knowledge:   Good  Insight:    Good  Judgment:   Good  Impulse Control:  Good   Risk Assessment: Danger to Self:  No Self-injurious Behavior: No Danger to Others: No Duty to Warn:no Physical Aggression / Violence:No  Access to Firearms a concern: No  Gang Involvement:No   Subjective: Patient today in session and working further on her depression, anxiety, grief, and struggling with setting limits with others. Shares how she tends to excessively worry at times about family members especially a niece and nephew not making good decision. Is liking retirement and having more time for activities with family and friends.Working on setting healthier boundaries, not being used nor manipulated by others. Limit setting especially needed with niece, nephews, and brother, but she is needing to set limits financially. Shares that for Thanksgiving she is going out with friends and looking forward to that.Working to decrease her people pleasing behaviors, setting better limits/boundaries for herself. Caring a lot for others and  needs to care more for herself. Depression not as bad as it was, but still an issue.  Make up more on this along with healthier boundaries and not allowing herself to be manipulated, at next session.    Interventions: Cognitive Behavioral Therapy, Solution-Oriented/Positive Psychology, and Ego-Supportive 1.  Work on unresolved grief issues from the past particularly regarding her mother's death, and identify life conflicts and/or situations including from the past and the present, that support current symptomology of anxiety/depression. 2.  Develop behavioral and cognitive strategies to reduce or eliminate excessive anxiety, depression.  Identify, challenge, and replace negative self-talk with more positive, realistic, and empowering self-talk. 3.  Patient will work on developing reality based, positive cognitive messages that can help her improve her mood and her outlook while also working on teaching laboratory technician.  (Patient added on 3 more goal areas to work on, after her 1st session) 1.  I also want to be able to stand up for myself without worrying about others when they make me feel bad although not intentionally. 2. I want to be able to say no and that is hard for me.  Hard for me to set clear boundaries.  Easily pushed by others. 3.  I am struggling some with retirement and need to be able to talk through this some in counseling.    Diagnosis:   ICD-10-CM   1. Generalized anxiety disorder  F41.1      Plan: Patient today working well on her self-esteem issues, some work on boundaries and not being manipulated, depression, and some grief, and anxiety  all brought up by patient in session today.  Am working with her to have some boundaries and some clear progression in sessions so that she does not get distracted going in different directions.  Needing to use the time wisely and have clear plans and prioritization's on what we need to focus on when she does come in for  session and she feels that would be helpful as well but also realizes she needs to to refrain from going in different directions when focusing on a particular behavior or goal.  This seemed to make a lot of sense to patient and would certainly help therapist better help her make progress that can increase over time and we will still be able to address multiple behaviors/issues.  Continues to work on self-esteem and self awareness, trying to refrain from using poor boundaries.  Is recognizing her need for healthy boundaries more clearly which is definitely a positive for her.  She has a history of having a lot of difficulty with healthy boundaries and to have change in this area would feel like healthy progress to her.  We do continue to work on her staying on track in sessions and today talked about this more at length and I think with more understanding on her part as to how this can help her more effectively.  Does have some good insight on some of her healthier choices she is trying to make as well as putting boundaries into place and her interpersonal relationships.  Is committed to her treatment goals although somewhat challenging and she is okay with that.  Goal review and progress/challenges noted with patient.  Next appointment within 2 to 3 weeks.   Barnie Bunde, LCSW

## 2023-12-29 ENCOUNTER — Encounter: Payer: Self-pay | Admitting: Physical Therapy

## 2023-12-29 ENCOUNTER — Ambulatory Visit: Admitting: Physical Therapy

## 2023-12-29 DIAGNOSIS — M25562 Pain in left knee: Secondary | ICD-10-CM | POA: Diagnosis not present

## 2023-12-29 DIAGNOSIS — M5459 Other low back pain: Secondary | ICD-10-CM

## 2023-12-29 DIAGNOSIS — M25561 Pain in right knee: Secondary | ICD-10-CM

## 2023-12-29 NOTE — Therapy (Signed)
 OUTPATIENT PHYSICAL THERAPY LOWER EXTREMITY TREATMENT   Patient Name: Rhonda Aguilar MRN: [id number] DOB:Jan 11, 1956, 68 y.o., female Today's Date: 12/29/2023   END OF SESSION:  PT End of Session - 12/29/23 1102     Visit Number 7    Number of Visits 16    Date for Recertification  01/31/24    Authorization Type UHC  10 v through 02/28/24    PT Start Time 1104    PT Stop Time 1143    PT Time Calculation (min) 39 min    Activity Tolerance Patient tolerated treatment well    Behavior During Therapy [facility] for tasks assessed/performed            Past Medical History:  Diagnosis Date   Anxiety    Arthritis    Benign essential HTN 11/22/2014   Cervical lymphadenopathy 06/01/2022   Diabetes mellitus, type II ([name])    Dyspnea    On exertion   Fatty liver    GERD (gastroesophageal reflux disease)    Hypertension    IBS (irritable bowel syndrome)    Migraine    Obesity (BMI 30-39.9) 11/22/2014   OSA (obstructive sleep apnea)    Pneumonia    Past Surgical History:  Procedure Laterality Date   BREAST BIOPSY Right 2014   CHOLECYSTECTOMY N/A 09/14/2019   Procedure: LAPAROSCOPIC CHOLECYSTECTOMY;  Surgeon: Signe Mitzie LABOR, MD;  Location: [facility];  Service: General;  Laterality: N/A;   COLONOSCOPY     TONSILLECTOMY     Patient Active Problem List   Diagnosis Date Noted   Grief 06/01/2023   Seasonal affective disorder 03/30/2023   Anxiety 02/21/2023   Cervical lymphadenopathy 06/01/2022   BMI 40.0-44.9, adult ([name]) 04/21/2022   Stress 04/21/2022   Obesity, Beginning BMI 42.25 04/21/2022   Constipation 02/23/2022   Hypertension associated with type 2 diabetes mellitus ([name]) 12/07/2021   Diabetes mellitus ([name]) 12/07/2021   Hyperlipidemia associated with type 2 diabetes mellitus ([name]) 12/07/2021   SOBOE (shortness of breath on exertion) 02/09/2021   Type 2 diabetes mellitus with hyperlipidemia ([name]) 02/09/2021   Vitamin D  deficiency 04/12/2018   Metabolic  dysfunction-associated steatotic liver disease (MASLD) 03/22/2018   Depression 03/22/2018   Intractable hemiplegic migraine with status migrainosus 01/17/2018   Intractable migraine with aura with status migrainosus 01/17/2018   Ocular migraine 12/12/2017   Class 3 severe obesity with serious comorbidity and body mass index (BMI) of 40.0 to 44.9 in adult ([name]) 12/12/2017   Other fatigue 12/08/2017   Shortness of breath on exertion 12/08/2017   Type 2 diabetes mellitus without complication, without long-term current use of insulin  ([name]) 12/08/2017   OSA on CPAP 12/08/2017   Low HDL (under 40) 09/10/2016   Metabolic syndrome 09/10/2016   Primary hypertension 02/05/2016   Gastroesophageal reflux disease 02/05/2016   Other hyperlipidemia 02/05/2016   DOE (dyspnea on exertion) 11/22/2014   Benign essential HTN 11/22/2014   OSA (obstructive sleep apnea)     PCP: Lamarr Rotunda  REFERRING PROVIDER: Artist Lloyd   REFERRING DIAG: Bil knee pain, low back pain   THERAPY DIAG:  Acute pain of left knee  Acute pain of right knee  Other low back pain  Rationale for Evaluation and Treatment: Rehabilitation  ONSET DATE:    SUBJECTIVE:   SUBJECTIVE STATEMENT:   Pt with no new complaints   Eval: Pt states Bil knee pain and Low back pain - comes and goes  Has stationary bike Was squatting and cleaning more recently, then went  up stairs-  knee with increased pain, felt like it was going to give out.   Most pain felt with standing- in knees and back, Walking not as bad.  L knee was hurting but has improved some. she wants to be more active in prep for trip in May. Did sign up for water aerobics.  Pt is retired engineer, civil (consulting).  L hip pain comes and goes with standing, shopping   PERTINENT HISTORY: HTN, DM 2, SOB with exertion (more with bending over) -has negative cardiac tests.   PAIN:  Are you having pain? Yes: NPRS scale: 3-4 up to 8 at night/ 10 Pain location: L knee  Pain  description: sore Aggravating factors: standing  Relieving factors: rest  Are you having pain? Yes: NPRS scale: 0-1 /10 Pain location: R knee  Pain description: sore Aggravating factors: standing Relieving factors: rest  Are you having pain? Yes: NPRS scale: up to 4/10 Pain location: back ,  L low back pain  Pain description: sore Aggravating factors: standing  Relieving factors: rest  PRECAUTIONS: None  WEIGHT BEARING RESTRICTIONS: No  FALLS:  Has patient fallen in last 6 months? No   PLOF: Independent  PATIENT GOALS:  Decreased pain in knees, back, increase exercise.   NEXT MD VISIT:   OBJECTIVE:   DIAGNOSTIC FINDINGS: 1. Osteopenia and degenerative change without evidence of fractures. 2. Mild lumbar levorotary scoliosis. 3. New mild grade 1 anterolisthesis at L5-S1, most probably due to disproportionate facet hypertrophy at this level. 4. Mild features of tricompartmental degenerative arthrosis of the right knee with small suprapatellar bursal effusion. 5. Progressive bone-on-bone joint space loss along the medial femorotibial joint of the left knee with reactive osteophytes. 6. Small suprapatellar bursal effusion of the left knee.   PATIENT SURVEYS:    THE PATIENT SPECIFIC FUNCTIONAL SCALE  Place score of 0-10 (0 = unable to perform activity and 10 = able to perform activity at the same level as before injury or problem)  Activity Date: 12/06/23 12/27/23    Getting up/down from floor        3       8   2.   Up/down stairs without hand support        2       8   3.   Standing 30 min        0       6        Total Score 1.66 7.33     Total Score = Sum of activity scores/number of activities  Minimally Detectable Change: 3 points (for single activity); 2 points (for average score)  [facility] (nd). The Patient Specific Functional Scale . Retrieved from  [email or web address]   COGNITION: Overall cognitive status: Within functional limits for tasks assessed     SENSATION: WFL  EDEMA: n/a   POSTURE:    No Significant postural limitations  PALPATION: Tenderness in L low back, into SI and some into glute Mild tenderness at L medial knee.    LOWER EXTREMITY ROM: Knees: WFL Hips: WFL Lumbar: mild limitation for SB and rotation.   LOWER EXTREMITY MMT:  MMT Left eval Right  eval  Hip flexion 4 4  Hip extension    Hip abduction 4+ 4+  Hip adduction    Hip internal rotation    Hip external rotation    Knee flexion 4 4  Knee extension 4 4  Ankle dorsiflexion    Ankle plantarflexion  Ankle inversion    Ankle eversion     (Blank rows = not tested)  LOWER EXTREMITY SPECIAL TESTS:    GAIT: Comments: unremarkable    TODAY'S TREATMENT:                                                                                                                              DATE:   12/29/2023 Therapeutic Exercise: Aerobic: Supine:   Seated:   Standing:     Stretches:  Neuromuscular Re-education: Manual Therapy:   Therapeutic Activity: Sit to stand mat table x 10 , 8lb  Rows BlueTB x 20  Step ups 6 in x 10 bil- no UE support Up/down 5 steps no UE support, x 8 Weighted carry/walk 10lb unilateral hold x 4 ea 15lb KB double arm hold x 4 Floor transfers with education on mechanics x 3 Self Care:      Therapeutic Exercise: Aerobic: Supine:  pelvic tilts x 15,  SLR  x 10 bil with TA ;   bridging 2 x 8;  Seated:   Standing:    HR,  hip abd,  squats,  all 1 x 10 ( 2 cycles )   Stretches:  Neuromuscular Re-education: Manual Therapy:   Therapeutic Activity: Sit to stand mat table x 10 , 8lb  Slow march x 20 bil (6 lb unilateral hold) Toe taps in 6 in step x 15  Rows BlueTB x 20  Step ups 6 in x 10 bil- light 1 UE support.  Self Care:   Previous:  Therapeutic  Exercise: Aerobic: Supine:  pelvic tilts x 15,  SKTC 20 sec x 3 bil;   Hip ER rom x 15 bil;  Seated:  LAQ 2.5 lb x 2 x 10 bil;     sit to stand 3 x 5  Standing:   heel raises x 15,  Marching x 20;  Stretches:  Neuromuscular Re-education: Manual Therapy: Therapeutic Activity: Self Care:  Eval:HEP- will issue next visit   PATIENT EDUCATION:  Education details: reviewed and issued initial HEP Person educated: Patient Education method: Explanation, Demonstration, Tactile cues, Verbal cues, and Handouts Education comprehension: verbalized understanding, returned demonstration, verbal cues required, tactile cues required, and needs further education   HOME EXERCISE PROGRAM: [email or web address]   ASSESSMENT:  CLINICAL IMPRESSION: Pt with improving tolerance for activity. Good ability for floor transfers today. Improved ability for stairs, with less UE support. Pt to benefit from continued care, making good progress.   Pt able to tolerate progressive standing ther ex, with intermittent rest breaks. Discussed ways to get/keep heart rate up with LE strength/functional activities and HEP. Also discussed options for return to gym or classes that would be motivating to her.  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   Eval: Patient presents with primary complaint of  pain in knees and back. Pt overall deconditioned and will benefit from progressing into exercise routine for  improving  strength and endurance. She has worsening OA in L knee per imaging, but pain only mild at this time. She has decreased confidence with stairs and dynamic activity due to instability and previous pain. Pt with lack of effective HEP for ongoing pain in back and knees . Pt with decreased ability for full functional activities. Pt will  benefit from skilled PT to improve deficits and pain and to return to PLOF.   OBJECTIVE IMPAIRMENTS: decreased activity tolerance, decreased mobility, decreased  strength, increased muscle spasms, improper body mechanics, and pain.   ACTIVITY LIMITATIONS: carrying, lifting, standing, squatting, stairs, and locomotion level  PARTICIPATION LIMITATIONS: meal prep, cleaning, laundry, driving, shopping, community activity, and yard work  PERSONAL FACTORS: Time since onset of injury/illness/exacerbation are also affecting patient's functional outcome.   REHAB POTENTIAL: Good  CLINICAL DECISION MAKING: Stable/uncomplicated  EVALUATION COMPLEXITY: Low   GOALS: Goals reviewed with patient? Yes  SHORT TERM GOALS: Target date: 12/27/2023  Pt to be independent with initial HEP  Goal status: INITIAL  2.  Pt to demo optimal mechanics for bend/squat for IADLS.   Goal status: INITIAL   LONG TERM GOALS: Target date: 01/31/2024   Pt to be independent with final HEP  Goal status: INITIAL  2.  Pt to demo ability for stairs with reciprocal gait and 1 hand rail or less, to improve ability for community activity.   Goal status: INITIAL  3.  Pt to report decreased pain in knees and back to 0-3/10 with standing activity for at least 30 min.   Goal status: INITIAL  4.  Pt to demo strength of knees and hips to be at least 4+/5, to improve stability and pain.   Goal status: INITIAL  5.  Pt to be complaint with walking program, up to 30 min, 3x/wk to improve endurance and stamina  Goal status: INITIAL   PLAN:  PT FREQUENCY: 1-2x/week  PT DURATION: 8 weeks  PLANNED INTERVENTIONS: Therapeutic exercises, Therapeutic activity, Neuromuscular re-education, Patient/Family education, Self Care, Joint mobilization, Joint manipulation, Stair training, Orthotic/Fit training, DME instructions, Aquatic Therapy, Dry Needling, Electrical stimulation, Cryotherapy, Moist heat, Taping, Ultrasound, Ionotophoresis 4mg /ml Dexamethasone , Manual therapy,  Vasopneumatic device, Traction, Spinal manipulation, Spinal mobilization,Balance training, Gait training,   PLAN  FOR NEXT SESSION:    picking up bags of dirt- to move-- carries, weighted carries. Mulch, etc.  Confidence with activity, bil knee strenth,  Next visit: low back, hip mobility, knee am mobility, - overall strength.   Progress strength, sit to stands, breathing, stairs.  ** core strength for back pain **   Tinnie Don, PT, DPT 11:04 AM  12/29/23

## 2024-01-03 ENCOUNTER — Encounter: Payer: Self-pay | Admitting: Physical Therapy

## 2024-01-03 ENCOUNTER — Other Ambulatory Visit (HOSPITAL_BASED_OUTPATIENT_CLINIC_OR_DEPARTMENT_OTHER): Payer: Self-pay

## 2024-01-03 ENCOUNTER — Ambulatory Visit: Admitting: Physical Therapy

## 2024-01-03 DIAGNOSIS — M25561 Pain in right knee: Secondary | ICD-10-CM

## 2024-01-03 DIAGNOSIS — M5459 Other low back pain: Secondary | ICD-10-CM | POA: Diagnosis not present

## 2024-01-03 DIAGNOSIS — M25562 Pain in left knee: Secondary | ICD-10-CM | POA: Diagnosis not present

## 2024-01-03 NOTE — Therapy (Signed)
 OUTPATIENT PHYSICAL THERAPY LOWER EXTREMITY TREATMENT   Patient Name: PHYLIS JAVED MRN: [id number] DOB:04/25/1955, 68 y.o., female Today's Date: 01/03/2024   END OF SESSION:  PT End of Session - 01/03/24 1102     Visit Number 8    Number of Visits 16    Date for Recertification  01/31/24    Authorization Type UHC  10 v through 02/28/24    PT Start Time 1022    PT Stop Time 1101    PT Time Calculation (min) 39 min    Activity Tolerance Patient tolerated treatment well    Behavior During Therapy [facility] for tasks assessed/performed             Past Medical History:  Diagnosis Date   Anxiety    Arthritis    Benign essential HTN 11/22/2014   Cervical lymphadenopathy 06/01/2022   Diabetes mellitus, type II ([name])    Dyspnea    On exertion   Fatty liver    GERD (gastroesophageal reflux disease)    Hypertension    IBS (irritable bowel syndrome)    Migraine    Obesity (BMI 30-39.9) 11/22/2014   OSA (obstructive sleep apnea)    Pneumonia    Past Surgical History:  Procedure Laterality Date   BREAST BIOPSY Right 2014   CHOLECYSTECTOMY N/A 09/14/2019   Procedure: LAPAROSCOPIC CHOLECYSTECTOMY;  Surgeon: Signe Mitzie LABOR, MD;  Location: [facility];  Service: General;  Laterality: N/A;   COLONOSCOPY     TONSILLECTOMY     Patient Active Problem List   Diagnosis Date Noted   Grief 06/01/2023   Seasonal affective disorder 03/30/2023   Anxiety 02/21/2023   Cervical lymphadenopathy 06/01/2022   BMI 40.0-44.9, adult ([name]) 04/21/2022   Stress 04/21/2022   Obesity, Beginning BMI 42.25 04/21/2022   Constipation 02/23/2022   Hypertension associated with type 2 diabetes mellitus ([name]) 12/07/2021   Diabetes mellitus ([name]) 12/07/2021   Hyperlipidemia associated with type 2 diabetes mellitus ([name]) 12/07/2021   SOBOE (shortness of breath on exertion) 02/09/2021   Type 2 diabetes mellitus with hyperlipidemia ([name]) 02/09/2021   Vitamin D  deficiency 04/12/2018   Metabolic  dysfunction-associated steatotic liver disease (MASLD) 03/22/2018   Depression 03/22/2018   Intractable hemiplegic migraine with status migrainosus 01/17/2018   Intractable migraine with aura with status migrainosus 01/17/2018   Ocular migraine 12/12/2017   Class 3 severe obesity with serious comorbidity and body mass index (BMI) of 40.0 to 44.9 in adult ([name]) 12/12/2017   Other fatigue 12/08/2017   Shortness of breath on exertion 12/08/2017   Type 2 diabetes mellitus without complication, without long-term current use of insulin  ([name]) 12/08/2017   OSA on CPAP 12/08/2017   Low HDL (under 40) 09/10/2016   Metabolic syndrome 09/10/2016   Primary hypertension 02/05/2016   Gastroesophageal reflux disease 02/05/2016   Other hyperlipidemia 02/05/2016   DOE (dyspnea on exertion) 11/22/2014   Benign essential HTN 11/22/2014   OSA (obstructive sleep apnea)     PCP: Lamarr Rotunda  REFERRING PROVIDER: Artist Lloyd   REFERRING DIAG: Bil knee pain, low back pain   THERAPY DIAG:  Acute pain of left knee  Acute pain of right knee  Other low back pain  Rationale for Evaluation and Treatment: Rehabilitation  ONSET DATE:    SUBJECTIVE:   SUBJECTIVE STATEMENT: Pt was able to go to concert and stand for longer duration, without much back pain.   Eval: Pt states Bil knee pain and Low back pain - comes and goes  Has  stationary bike Was squatting and cleaning more recently, then went up stairs-  knee with increased pain, felt like it was going to give out.   Most pain felt with standing- in knees and back, Walking not as bad.  L knee was hurting but has improved some. she wants to be more active in prep for trip in May. Did sign up for water aerobics.  Pt is retired engineer, civil (consulting).  L hip pain comes and goes with standing, shopping   PERTINENT HISTORY: HTN, DM 2, SOB with exertion (more with bending over) -has negative cardiac tests.   PAIN:  Are you having pain? Yes: NPRS scale: 3-4 up  to 8 at night/ 10 Pain location: L knee  Pain description: sore Aggravating factors: standing  Relieving factors: rest  Are you having pain? Yes: NPRS scale: 0-1 /10 Pain location: R knee  Pain description: sore Aggravating factors: standing Relieving factors: rest  Are you having pain? Yes: NPRS scale: up to 4/10 Pain location: back ,  L low back pain  Pain description: sore Aggravating factors: standing  Relieving factors: rest  PRECAUTIONS: None  WEIGHT BEARING RESTRICTIONS: No  FALLS:  Has patient fallen in last 6 months? No   PLOF: Independent  PATIENT GOALS:  Decreased pain in knees, back, increase exercise.   NEXT MD VISIT:   OBJECTIVE:   DIAGNOSTIC FINDINGS: 1. Osteopenia and degenerative change without evidence of fractures. 2. Mild lumbar levorotary scoliosis. 3. New mild grade 1 anterolisthesis at L5-S1, most probably due to disproportionate facet hypertrophy at this level. 4. Mild features of tricompartmental degenerative arthrosis of the right knee with small suprapatellar bursal effusion. 5. Progressive bone-on-bone joint space loss along the medial femorotibial joint of the left knee with reactive osteophytes. 6. Small suprapatellar bursal effusion of the left knee.   PATIENT SURVEYS:    THE PATIENT SPECIFIC FUNCTIONAL SCALE  Place score of 0-10 (0 = unable to perform activity and 10 = able to perform activity at the same level as before injury or problem)  Activity Date: 12/06/23 12/27/23    Getting up/down from floor        3       8   2.   Up/down stairs without hand support        2       8   3.   Standing 30 min        0       6        Total Score 1.66 7.33     Total Score = Sum of activity scores/number of activities  Minimally Detectable Change: 3 points (for single activity); 2 points (for average score)  [facility] (nd). The Patient Specific Functional Scale . Retrieved from  [email or web address]   COGNITION: Overall cognitive status: Within functional limits for tasks assessed     SENSATION: WFL  EDEMA: n/a   POSTURE:    No Significant postural limitations  PALPATION: Tenderness in L low back, into SI and some into glute Mild tenderness at L medial knee.    LOWER EXTREMITY ROM: Knees: WFL Hips: WFL Lumbar: mild limitation for SB and rotation.   LOWER EXTREMITY MMT:  MMT Left eval Right  eval  Hip flexion 4 4  Hip extension    Hip abduction 4+ 4+  Hip adduction    Hip internal rotation    Hip external rotation    Knee flexion 4 4  Knee extension 4 4  Ankle dorsiflexion    Ankle plantarflexion    Ankle inversion    Ankle eversion     (Blank rows = not tested)  LOWER EXTREMITY SPECIAL TESTS:    GAIT: Comments: unremarkable    TODAY'S TREATMENT:                                                                                                                              DATE:   01/03/2024 Therapeutic Exercise: Aerobic: Supine:   SLR x 10 , in/out x 10 bil with cueing for core contraction;   bridging 2 x 8 ; SKTC x 2 btwn bridging ; LTR x 10;  Seated:   Standing:     Stretches:  Supine piriformis x 3 bil;  Neuromuscular Re-education: Manual Therapy:   Therapeutic Activity: Sit to stand mat table 2 x 10 , 10 lb  Up/down 5 steps no UE support, x 8 Weighted carry/walk 15lb unilateral hold x 4 ea 15lb KB double arm hold x 4 Self Care:    Therapeutic Exercise: Aerobic: Supine:  pelvic tilts x 15,  SLR  x 10 bil with TA ;   bridging 2 x 8;  Seated:   Standing:    HR,  hip abd,  squats,  all 1 x 10 ( 2 cycles )   Stretches:  Neuromuscular Re-education: Manual Therapy:   Therapeutic Activity: Sit to stand mat table x 10 , 8lb  Slow march x 20 bil (6 lb unilateral hold) Toe taps in 6 in step x 15  Rows BlueTB x 20  Step ups 6 in x 10 bil- light 1 UE support.  Self  Care:   Previous:  Therapeutic Exercise: Aerobic: Supine:  pelvic tilts x 15,  SKTC 20 sec x 3 bil;   Hip ER rom x 15 bil;  Seated:  LAQ 2.5 lb x 2 x 10 bil;     sit to stand 3 x 5  Standing:   heel raises x 15,  Marching x 20;  Stretches:  Neuromuscular Re-education: Manual Therapy: Therapeutic Activity: Self Care:  Eval:HEP- will issue next visit   PATIENT EDUCATION:  Education details: reviewed and issued initial HEP Person educated: Patient Education method: Explanation, Demonstration, Tactile cues, Verbal cues, and Handouts Education comprehension: verbalized understanding, returned demonstration, verbal cues required, tactile cues required, and needs further education   HOME EXERCISE PROGRAM: [facility]   ASSESSMENT:  CLINICAL IMPRESSION:  Pt with improving tolerance for activity, still getting fatigued, and requires seated rest breaks, but able to do higher level activity. Continued education today on core control during activity. She will benefit from continued strengthening and endurance.    Eval: Patient presents with primary complaint of  pain in knees and back. Pt overall deconditioned and will benefit from progressing into exercise routine for  improving strength and endurance. She has worsening OA in L knee per imaging, but pain only mild at this time. She has decreased confidence  with stairs and dynamic activity due to instability and previous pain. Pt with lack of effective HEP for ongoing pain in back and knees . Pt with decreased ability for full functional activities. Pt will  benefit from skilled PT to improve deficits and pain and to return to PLOF.   OBJECTIVE IMPAIRMENTS: decreased activity tolerance, decreased mobility, decreased strength, increased muscle spasms, improper body mechanics, and pain.   ACTIVITY LIMITATIONS: carrying, lifting, standing, squatting, stairs, and locomotion level  PARTICIPATION LIMITATIONS: meal prep, cleaning, laundry, driving,  shopping, community activity, and yard work  PERSONAL FACTORS: Time since onset of injury/illness/exacerbation are also affecting patient's functional outcome.   REHAB POTENTIAL: Good  CLINICAL DECISION MAKING: Stable/uncomplicated  EVALUATION COMPLEXITY: Low   GOALS: Goals reviewed with patient? Yes  SHORT TERM GOALS: Target date: 12/27/2023  Pt to be independent with initial HEP  Goal status: MET  2.  Pt to demo optimal mechanics for bend/squat for IADLS.   Goal status: MET   LONG TERM GOALS: Target date: 01/31/2024   Pt to be independent with final HEP  Goal status: MET  2.  Pt to demo ability for stairs with reciprocal gait and 1 hand rail or less, to improve ability for community activity.   Goal status: in progress   3.  Pt to report decreased pain in knees and back to 0-3/10 with standing activity for at least 30 min.   Goal status: in progress   4.  Pt to demo strength of knees and hips to be at least 4+/5, to improve stability and pain.   Goal status: in progress   5.  Pt to be complaint with walking program, up to 30 min, 3x/wk to improve endurance and stamina  Goal status: in progress    PLAN:  PT FREQUENCY: 1-2x/week  PT DURATION: 8 weeks  PLANNED INTERVENTIONS: Therapeutic exercises, Therapeutic activity, Neuromuscular re-education, Patient/Family education, Self Care, Joint mobilization, Joint manipulation, Stair training, Orthotic/Fit training, DME instructions, Aquatic Therapy, Dry Needling, Electrical stimulation, Cryotherapy, Moist heat, Taping, Ultrasound, Ionotophoresis 4mg /ml Dexamethasone , Manual therapy,  Vasopneumatic device, Traction, Spinal manipulation, Spinal mobilization,Balance training, Gait training,   PLAN FOR NEXT SESSION:    picking up bags of dirt- to move-- carries, weighted carries. Mulch, etc.  Core strength, hip strength- standing hip abd,   Tinnie Don, PT, DPT 11:03 AM  01/03/24

## 2024-01-04 NOTE — Progress Notes (Signed)
 Rhonda Aguilar                                          MRN: [id number]   01/04/2024   The [facility] Quality Team Specialist reviewed this patient medical record for the purposes of chart review for care gap closure. The following were reviewed: chart review for care gap closure-kidney health evaluation for diabetes:eGFR  and uACR.    [facility] Quality Team

## 2024-01-05 ENCOUNTER — Encounter: Admitting: Physical Therapy

## 2024-01-09 ENCOUNTER — Other Ambulatory Visit: Payer: Self-pay

## 2024-01-09 ENCOUNTER — Other Ambulatory Visit (HOSPITAL_BASED_OUTPATIENT_CLINIC_OR_DEPARTMENT_OTHER): Payer: Self-pay

## 2024-01-09 ENCOUNTER — Encounter (INDEPENDENT_AMBULATORY_CARE_PROVIDER_SITE_OTHER): Payer: Self-pay | Admitting: Family Medicine

## 2024-01-09 ENCOUNTER — Telehealth (INDEPENDENT_AMBULATORY_CARE_PROVIDER_SITE_OTHER): Payer: Self-pay

## 2024-01-09 ENCOUNTER — Ambulatory Visit (INDEPENDENT_AMBULATORY_CARE_PROVIDER_SITE_OTHER): Payer: Self-pay | Admitting: Family Medicine

## 2024-01-09 VITALS — BP 130/83 | HR 78 | Temp 97.7°F | Ht 62.0 in | Wt 219.0 lb

## 2024-01-09 DIAGNOSIS — E119 Type 2 diabetes mellitus without complications: Secondary | ICD-10-CM

## 2024-01-09 DIAGNOSIS — E559 Vitamin D deficiency, unspecified: Secondary | ICD-10-CM

## 2024-01-09 DIAGNOSIS — G8929 Other chronic pain: Secondary | ICD-10-CM

## 2024-01-09 DIAGNOSIS — E1169 Type 2 diabetes mellitus with other specified complication: Secondary | ICD-10-CM

## 2024-01-09 DIAGNOSIS — Z6841 Body Mass Index (BMI) 40.0 and over, adult: Secondary | ICD-10-CM

## 2024-01-09 DIAGNOSIS — Z7985 Long-term (current) use of injectable non-insulin antidiabetic drugs: Secondary | ICD-10-CM

## 2024-01-09 DIAGNOSIS — M170 Bilateral primary osteoarthritis of knee: Secondary | ICD-10-CM

## 2024-01-09 DIAGNOSIS — K219 Gastro-esophageal reflux disease without esophagitis: Secondary | ICD-10-CM

## 2024-01-09 DIAGNOSIS — M25562 Pain in left knee: Secondary | ICD-10-CM | POA: Diagnosis not present

## 2024-01-09 MED ORDER — VITAMIN D (ERGOCALCIFEROL) 1.25 MG (50000 UNIT) PO CAPS
50000.0000 [IU] | ORAL_CAPSULE | ORAL | 0 refills | Status: DC
  Filled 2024-01-09: qty 12, 84d supply, fill #0

## 2024-01-09 MED ORDER — MOUNJARO 12.5 MG/0.5ML ~~LOC~~ SOAJ
12.5000 mg | SUBCUTANEOUS | 0 refills | Status: DC
  Filled 2024-01-09: qty 2, 28d supply, fill #0

## 2024-01-09 NOTE — Progress Notes (Signed)
 Office: [phone]  /  Fax: [phone]  WEIGHT SUMMARY AND BIOMETRICS  Anthropometric Measurements Height: 5' 2 (1.575 m) Weight: 219 lb (99.3 kg) BMI (Calculated): 40.05 Weight at Last Visit: 221 lb Weight Lost Since Last Visit: 2 lb Weight Gained Since Last Visit: 0 Starting Weight: 231 lb Total Weight Loss (lbs): 12 lb (5.443 kg) Peak Weight: 235 lb   Body Composition  Body Fat %: 48.1 % Fat Mass (lbs): 105.4 lbs Muscle Mass (lbs): 107.8 lbs Total Body Water (lbs): 77.8 lbs Visceral Fat Rating : 16   Other Clinical Data Fasting: no Labs: no Today's Visit #: 66 Starting Date: 12/08/17    Chief Complaint: OBESITY    History of Present Illness Rhonda Aguilar is a 68 year old female with obesity and type 2 diabetes who presents for obesity treatment.  She adheres to a calorie-restricted diet targeting 1200 calories and at least 80 grams of protein daily. Her exercise routine includes physical therapy and water aerobics three times a week for 45 minutes each session. She has lost two pounds in the last month, bringing her weight below 220 pounds for the first time in years. She attributes her weight loss to her medication and reduced food intake.  She manages her type 2 diabetes with Mounjaro  12.5 mg, which she is due to refill. Her morning glucose levels have been in the 120s most mornings this week, with a higher reading today after consuming peanut butter M&Ms. She experiences occasional headaches and dizziness but finds them manageable.  She is treated for vitamin D  deficiency with ergocalciferol  50,000 IU weekly, which requires a refill. She plans to have her vitamin D  levels checked during her upcoming lab work.  She reports improved physical activity and mobility, noting significant improvement in her left knee, which previously required support for balance. She has reduced her physical therapy sessions to once a week and increased water aerobics to twice a  week. She attended a concert recently and was able to stand and dance without knee pain, although she experienced some discomfort walking back to the car. Her right knee has mild arthritis but is less problematic than the left.  She has a history of fatty liver disease, which has fluctuated with her weight changes. She is hopeful that her recent weight loss will improve her liver function tests. She is scheduled for lab work to check her A1c, liver function, and vitamin D  levels.  She experienced increased appetite after consuming a THC-infused beverage, which she plans to avoid in the future. She is cautious about her diet, avoiding heavy casseroles and opting for controlled portions at restaurants.      PHYSICAL EXAM:  Blood pressure 130/83, pulse 78, temperature 97.7 F (36.5 C), height 5' 2 (1.575 m), weight 219 lb (99.3 kg), SpO2 96%. Body mass index is 40.06 kg/m.  DIAGNOSTIC DATA REVIEWED:  BMET    Component Value Date/Time   NA 143 11/26/2021 0919   K 4.0 11/26/2021 0919   CL 103 11/26/2021 0919   CO2 24 11/26/2021 0919   GLUCOSE 118 (H) 11/26/2021 0919   GLUCOSE 192 (H) 09/05/2019 1030   BUN 14 11/26/2021 0919   CREATININE 0.75 11/26/2021 0919   CALCIUM  9.4 11/26/2021 0919   GFRNONAA 71 04/08/2020 1012   GFRAA 81 04/08/2020 1012   Lab Results  Component Value Date   HGBA1C 6.4 (H) 11/26/2021   HGBA1C 6.7 (H) 04/19/2019   Lab Results  Component Value Date   INSULIN   37.9 (H) 11/26/2021   INSULIN  38.2 (H) 12/08/2017   Lab Results  Component Value Date   TSH 2.550 12/08/2017   CBC    Component Value Date/Time   WBC 7.6 09/05/2019 1030   RBC 5.53 (H) 09/05/2019 1030   HGB 15.0 09/05/2019 1030   HGB 12.9 12/08/2017 1306   HCT 47.0 (H) 09/05/2019 1030   HCT 38.1 12/08/2017 1306   PLT 228 09/05/2019 1030   MCV 85.0 09/05/2019 1030   MCV 82 12/08/2017 1306   MCH 27.1 09/05/2019 1030   MCHC 31.9 09/05/2019 1030   RDW 15.4 09/05/2019 1030   RDW 14.0  12/08/2017 1306   Iron Studies No results found for: IRON, TIBC, FERRITIN, IRONPCTSAT Lipid Panel     Component Value Date/Time   CHOL 195 11/26/2021 0919   TRIG 228 (H) 11/26/2021 0919   HDL 44 11/26/2021 0919   CHOLHDL 4.4 11/26/2021 0919   LDLCALC 111 (H) 11/26/2021 0919   Hepatic Function Panel     Component Value Date/Time   PROT 6.5 11/26/2021 0919   ALBUMIN 4.6 11/26/2021 0919   AST 62 (H) 11/26/2021 0919   ALT 88 (H) 11/26/2021 0919   ALKPHOS 79 11/26/2021 0919   BILITOT 0.4 11/26/2021 0919      Component Value Date/Time   TSH 2.550 12/08/2017 1306   Nutritional Lab Results  Component Value Date   VD25OH 34.5 11/26/2021   VD25OH 42.3 02/09/2021   VD25OH 46.6 04/08/2020     Assessment and Plan Assessment & Plan Morbid obesity (BMI 40-44.9) Morbid obesity with recent weight loss of 2 pounds in the last month. She is on a 1200 calorie diet with a protein goal of 80 grams and exercises three days a week. Mounjaro  12.5 mg is aiding in weight loss. She reports feeling less hungry and has adjusted her medication regimen to maintain appetite suppression. She is cautious about dietary choices, especially during holidays, and plans to attend a controlled environment for Thanksgiving. - Continue Mounjaro  12.5 mg. - Maintain current dietary plan with 1200 calorie goal and 80 grams of protein. - Continue exercise regimen three days a week. - Monitor weight and dietary intake.  Type 2 diabetes mellitus Type 2 diabetes managed with Mounjaro . Blood glucose levels have been in the 120s most mornings, with occasional spikes due to dietary choices. She is aware of the impact of certain foods on glucose levels and is adjusting her diet accordingly. Awaiting A1c results to assess overall glucose control. - Continue Mounjaro  12.5 mg. - Monitor blood glucose levels regularly. - Await A1c results from upcoming lab work. - Continue diet, exercise and weight loss as discussed  today as an important part of the treatment plan   Vitamin D  deficiency Managed with ergocalciferol  50,000 IU weekly. She requires a refill of her prescription. - Refilled ergocalciferol  50,000 IU weekly.  Osteoarthritis of bilateral knees with left knee pain Osteoarthritis with significant left knee pain. She has been undergoing physical therapy and water aerobics, which have improved her knee function and balance. Cleared for two more physical therapy visits. Follow-up with sports medicine scheduled for December 2nd. Reports improvement in knee function and balance, with occasional pain after prolonged activity. - Continue physical therapy and water aerobics. - Attend two more physical therapy sessions. - Follow up with sports medicine on December 2nd.  Fatty liver disease Monitored with upcoming lab work. Weight loss is expected to improve liver function. She is aware of the potential need for additional medication  if liver function does not improve. - Ordered liver function tests with upcoming lab work. - Monitor weight loss progress. - Continue diet, exercise and weight loss as discussed today as an important part of the treatment plan     Rosselyn was counseled on the importance of maintaining healthy lifestyle habits, including balanced nutrition, regular physical activity, and behavioral modifications, while taking antiobesity medication.  Patient verbalized understanding that medication is an adjunct to, not a replacement for, lifestyle changes and that the long-term success and weight maintenance depend on continued adherence to these strategies.   Any was informed of the importance of frequent follow up visits to maximize her success with intensive lifestyle modifications for her obesity and obesity related health conditions as recommended by USPSTF and CMS guidelines   Louann Penton, MD

## 2024-01-09 NOTE — Telephone Encounter (Signed)
 Called and left a message for lab results to be faxed to our clinic.  Awaiting results.

## 2024-01-10 ENCOUNTER — Ambulatory Visit: Admitting: Physical Therapy

## 2024-01-10 ENCOUNTER — Encounter: Payer: Self-pay | Admitting: Physical Therapy

## 2024-01-10 DIAGNOSIS — M5459 Other low back pain: Secondary | ICD-10-CM

## 2024-01-10 DIAGNOSIS — M25562 Pain in left knee: Secondary | ICD-10-CM | POA: Diagnosis not present

## 2024-01-10 DIAGNOSIS — M25561 Pain in right knee: Secondary | ICD-10-CM

## 2024-01-10 NOTE — Therapy (Signed)
 OUTPATIENT PHYSICAL THERAPY LOWER EXTREMITY TREATMENT   Patient Name: Rhonda Aguilar MRN: [id number] DOB:May 27, 1955, 68 y.o., female Today's Date: 01/10/2024   END OF SESSION:  PT End of Session - 01/10/24 1433     Visit Number 9    Number of Visits 16    Date for Recertification  01/31/24    Authorization Type [employer]  10 v through 02/28/24    PT Start Time 1107    PT Stop Time 1146    PT Time Calculation (min) 39 min    Activity Tolerance Patient tolerated treatment well    Behavior During Therapy [facility] for tasks assessed/performed              Past Medical History:  Diagnosis Date   Anxiety    Arthritis    Benign essential HTN 11/22/2014   Cervical lymphadenopathy 06/01/2022   Diabetes mellitus, type II ([name])    Dyspnea    On exertion   Fatty liver    GERD (gastroesophageal reflux disease)    Hypertension    IBS (irritable bowel syndrome)    Migraine    Obesity (BMI 30-39.9) 11/22/2014   OSA (obstructive sleep apnea)    Pneumonia    Past Surgical History:  Procedure Laterality Date   BREAST BIOPSY Right 2014   CHOLECYSTECTOMY N/A 09/14/2019   Procedure: LAPAROSCOPIC CHOLECYSTECTOMY;  Surgeon: Signe Mitzie LABOR, MD;  Location: [facility];  Service: General;  Laterality: N/A;   COLONOSCOPY     TONSILLECTOMY     Patient Active Problem List   Diagnosis Date Noted   Grief 06/01/2023   Seasonal affective disorder 03/30/2023   Anxiety 02/21/2023   Cervical lymphadenopathy 06/01/2022   BMI 40.0-44.9, adult ([name]) 04/21/2022   Stress 04/21/2022   Obesity, Beginning BMI 42.25 04/21/2022   Constipation 02/23/2022   Hypertension associated with type 2 diabetes mellitus ([name]) 12/07/2021   Diabetes mellitus ([name]) 12/07/2021   Hyperlipidemia associated with type 2 diabetes mellitus ([name]) 12/07/2021   SOBOE (shortness of breath on exertion) 02/09/2021   Type 2 diabetes mellitus with hyperlipidemia ([name]) 02/09/2021   Vitamin D  deficiency 04/12/2018   Metabolic  dysfunction-associated steatotic liver disease (MASLD) 03/22/2018   Depression 03/22/2018   Intractable hemiplegic migraine with status migrainosus 01/17/2018   Intractable migraine with aura with status migrainosus 01/17/2018   Ocular migraine 12/12/2017   Class 3 severe obesity with serious comorbidity and body mass index (BMI) of 40.0 to 44.9 in adult ([name]) 12/12/2017   Other fatigue 12/08/2017   Shortness of breath on exertion 12/08/2017   Type 2 diabetes mellitus without complication, without long-term current use of insulin  ([name]) 12/08/2017   OSA on CPAP 12/08/2017   Low HDL (under 40) 09/10/2016   Metabolic syndrome 09/10/2016   Primary hypertension 02/05/2016   Gastroesophageal reflux disease 02/05/2016   Other hyperlipidemia 02/05/2016   DOE (dyspnea on exertion) 11/22/2014   Benign essential HTN 11/22/2014   OSA (obstructive sleep apnea)     PCP: Lamarr Rotunda  REFERRING PROVIDER: Artist Lloyd   REFERRING DIAG: Bil knee pain, low back pain   THERAPY DIAG:  Acute pain of left knee  Acute pain of right knee  Other low back pain  Rationale for Evaluation and Treatment: Rehabilitation  ONSET DATE:    SUBJECTIVE:   SUBJECTIVE STATEMENT: Pt states doing well, noticing improvements in ease of her mobility. She has also been doing to water aerobics/deep water, 1x/wk   Eval: Pt states Bil knee pain and Low back pain -  comes and goes  Has stationary bike Was squatting and cleaning more recently, then went up stairs-  knee with increased pain, felt like it was going to give out.   Most pain felt with standing- in knees and back, Walking not as bad.  L knee was hurting but has improved some. she wants to be more active in prep for trip in May. Did sign up for water aerobics.  Pt is retired engineer, civil (consulting).  L hip pain comes and goes with standing, shopping   PERTINENT HISTORY: HTN, DM 2, SOB with exertion (more with bending over) -has negative cardiac tests.   PAIN:   Are you having pain? Yes: NPRS scale: 3-4 up to 8 at night/ 10 Pain location: L knee  Pain description: sore Aggravating factors: standing  Relieving factors: rest  Are you having pain? Yes: NPRS scale: 0-1 /10 Pain location: R knee  Pain description: sore Aggravating factors: standing Relieving factors: rest  Are you having pain? Yes: NPRS scale: up to 4/10 Pain location: back ,  L low back pain  Pain description: sore Aggravating factors: standing  Relieving factors: rest  PRECAUTIONS: None  WEIGHT BEARING RESTRICTIONS: No  FALLS:  Has patient fallen in last 6 months? No   PLOF: Independent  PATIENT GOALS:  Decreased pain in knees, back, increase exercise.   NEXT MD VISIT:   OBJECTIVE:   DIAGNOSTIC FINDINGS: 1. Osteopenia and degenerative change without evidence of fractures. 2. Mild lumbar levorotary scoliosis. 3. New mild grade 1 anterolisthesis at L5-S1, most probably due to disproportionate facet hypertrophy at this level. 4. Mild features of tricompartmental degenerative arthrosis of the right knee with small suprapatellar bursal effusion. 5. Progressive bone-on-bone joint space loss along the medial femorotibial joint of the left knee with reactive osteophytes. 6. Small suprapatellar bursal effusion of the left knee.   PATIENT SURVEYS:    THE PATIENT SPECIFIC FUNCTIONAL SCALE  Place score of 0-10 (0 = unable to perform activity and 10 = able to perform activity at the same level as before injury or problem)  Activity Date: 12/06/23 12/27/23    Getting up/down from floor        3       8   2.   Up/down stairs without hand support        2       8   3.   Standing 30 min        0       6        Total Score 1.66 7.33     Total Score = Sum of activity scores/number of activities  Minimally Detectable Change: 3 points (for single activity); 2 points (for average score)  [facility] (nd). The Patient Specific Functional Scale . Retrieved  from [email or web address]   COGNITION: Overall cognitive status: Within functional limits for tasks assessed     SENSATION: WFL  EDEMA: n/a   POSTURE:    No Significant postural limitations  PALPATION: Tenderness in L low back, into SI and some into glute Mild tenderness at L medial knee.    LOWER EXTREMITY ROM: Knees: WFL Hips: WFL Lumbar: mild limitation for SB and rotation.   LOWER EXTREMITY MMT:  MMT Left eval Right  eval  Hip flexion 4 4  Hip extension    Hip abduction 4+ 4+  Hip adduction    Hip internal rotation    Hip external rotation    Knee flexion 4 4  Knee extension 4 4  Ankle dorsiflexion    Ankle plantarflexion    Ankle inversion    Ankle eversion     (Blank rows = not tested)  LOWER EXTREMITY SPECIAL TESTS:    GAIT: Comments: unremarkable    TODAY'S TREATMENT:                                                                                                                              DATE:   01/10/2024 Therapeutic Exercise: Aerobic: Supine:   SLR x 10 , in/out x 10 bil with cueing for core contraction;   bridging 2 x 8 ;  SKTC x 2 btwn bridging ; LTR x 10;  Seated:   Standing:    hip abd 2 x 10 bil;  Stretches:  Neuromuscular Re-education: Manual Therapy:   Therapeutic Activity: Squats  x 10, then x 6  , 10 lb  Up/down 5 steps no UE support, x 8,  unilateral hold 8lb  In hallway: Side stepping , bwd walking, fwd- march/walk x 4 each (education for doing in pool as well)  Self Care:    Therapeutic Exercise: Aerobic: Supine:   SLR x 10 , in/out x 10 bil with cueing for core contraction;   bridging 2 x 8 ; SKTC x 2 btwn bridging ; LTR x 10;  Seated:   Standing:     Stretches:  Supine piriformis x 3 bil;  Neuromuscular Re-education: Manual Therapy:   Therapeutic Activity: Sit to stand mat table 2 x 10 , 10 lb  Up/down 5 steps no UE support, x 8 Weighted carry/walk 15lb  unilateral hold x 4 ea 15lb KB double arm hold x 4 Self Care:    Therapeutic Exercise: Aerobic: Supine:  pelvic tilts x 15,  SLR  x 10 bil with TA ;   bridging 2 x 8;  Seated:   Standing:    HR,  hip abd,  squats,  all 1 x 10 ( 2 cycles )   Stretches:  Neuromuscular Re-education: Manual Therapy:   Therapeutic Activity: Sit to stand mat table x 10 , 8lb  Slow march x 20 bil (6 lb unilateral hold) Toe taps in 6 in step x 15  Rows BlueTB x 20  Step ups 6 in x 10 bil- light 1 UE support.  Self Care:   Previous:  Therapeutic Exercise: Aerobic: Supine:  pelvic tilts x 15,  SKTC 20 sec x 3 bil;   Hip ER rom x 15 bil;  Seated:  LAQ 2.5 lb x 2 x 10 bil;     sit to stand 3 x 5  Standing:   heel raises x 15,  Marching x 20;  Stretches:  Neuromuscular Re-education: Manual Therapy: Therapeutic Activity: Self Care:  Eval:HEP- will issue next visit   PATIENT EDUCATION:  Education details: reviewed and issued initial HEP Person educated: Patient Education method: Explanation, Demonstration, Tactile cues, Verbal cues, and Handouts Education comprehension: verbalized  understanding, returned demonstration, verbal cues required, tactile cues required, and needs further education   HOME EXERCISE PROGRAM: [facility]   ASSESSMENT:  CLINICAL IMPRESSION:  Pt progressing well with ability and tolerance for exercise, as well as compliance for increasing activity at home. Education on importance of slow progression of activity in the next several months to ensure continued compliance. Will work towards d/c .    Eval: Patient presents with primary complaint of  pain in knees and back. Pt overall deconditioned and will benefit from progressing into exercise routine for  improving strength and endurance. She has worsening OA in L knee per imaging, but pain only mild at this time. She has decreased confidence with stairs and dynamic activity due to instability and previous pain. Pt with lack of  effective HEP for ongoing pain in back and knees . Pt with decreased ability for full functional activities. Pt will  benefit from skilled PT to improve deficits and pain and to return to PLOF.   OBJECTIVE IMPAIRMENTS: decreased activity tolerance, decreased mobility, decreased strength, increased muscle spasms, improper body mechanics, and pain.   ACTIVITY LIMITATIONS: carrying, lifting, standing, squatting, stairs, and locomotion level  PARTICIPATION LIMITATIONS: meal prep, cleaning, laundry, driving, shopping, community activity, and yard work  PERSONAL FACTORS: Time since onset of injury/illness/exacerbation are also affecting patient's functional outcome.   REHAB POTENTIAL: Good  CLINICAL DECISION MAKING: Stable/uncomplicated  EVALUATION COMPLEXITY: Low   GOALS: Goals reviewed with patient? Yes  SHORT TERM GOALS: Target date: 12/27/2023  Pt to be independent with initial HEP  Goal status: MET  2.  Pt to demo optimal mechanics for bend/squat for IADLS.   Goal status: MET   LONG TERM GOALS: Target date: 01/31/2024   Pt to be independent with final HEP  Goal status: MET  2.  Pt to demo ability for stairs with reciprocal gait and 1 hand rail or less, to improve ability for community activity.   Goal status: in progress   3.  Pt to report decreased pain in knees and back to 0-3/10 with standing activity for at least 30 min.   Goal status: in progress   4.  Pt to demo strength of knees and hips to be at least 4+/5, to improve stability and pain.   Goal status: in progress   5.  Pt to be complaint with walking program, up to 30 min, 3x/wk to improve endurance and stamina  Goal status: in progress    PLAN:  PT FREQUENCY: 1-2x/week  PT DURATION: 8 weeks  PLANNED INTERVENTIONS: Therapeutic exercises, Therapeutic activity, Neuromuscular re-education, Patient/Family education, Self Care, Joint mobilization, Joint manipulation, Stair training, Orthotic/Fit training,  DME instructions, Aquatic Therapy, Dry Needling, Electrical stimulation, Cryotherapy, Moist heat, Taping, Ultrasound, Ionotophoresis 4mg /ml Dexamethasone , Manual therapy,  Vasopneumatic device, Traction, Spinal manipulation, Spinal mobilization,Balance training, Gait training,   PLAN FOR NEXT SESSION:    picking up bags of dirt- to move-- carries, weighted carries. Mulch, etc.  Core strength, hip strength- standing hip abd,  * Finalize HEP*  Tinnie Don, PT, DPT 2:37 PM  01/10/24

## 2024-01-11 ENCOUNTER — Other Ambulatory Visit (INDEPENDENT_AMBULATORY_CARE_PROVIDER_SITE_OTHER): Payer: Self-pay

## 2024-01-11 ENCOUNTER — Ambulatory Visit: Admitting: Psychiatry

## 2024-01-11 DIAGNOSIS — F411 Generalized anxiety disorder: Secondary | ICD-10-CM | POA: Diagnosis not present

## 2024-01-11 NOTE — Telephone Encounter (Signed)
Lab results received and abstracted.

## 2024-01-11 NOTE — Progress Notes (Signed)
 [facility] Counselor/Therapist Progress Note  Patient ID: Rhonda Aguilar, MRN: [id number],    Date: 01/11/2024  Time Spent: 53 minutes   Treatment Type: Individual Therapy  Reported Symptoms: anxiety, depression, worrying, unresolved grief with mother's death 10 yrs ago, difficulty setting limits with others and myself, feeling burned-out at times   Mental Status Exam:  Appearance:   Casual     Behavior:  Appropriate, Sharing, and Motivated  Motor:  Normal  Speech/Language:   Clear and Coherent  Affect:  Depressed and anxious  Mood:  anxious and depressed  Thought process:  goal directed  Thought content:    Rumination  Sensory/Perceptual disturbances:    WNL  Orientation:  oriented to person, place, time/date, situation, day of week, month of year, year, and stated date of Nov. 19, 2025  Attention:  Good  Concentration:  Good  Memory:  WNL  Fund of knowledge:   Good  Insight:    Good  Judgment:   Good  Impulse Control:  Fair and some over-spending   Risk Assessment: Danger to Self:  No Self-injurious Behavior: No Danger to Others: No Duty to Warn:no Physical Aggression / Violence:No  Access to Firearms a concern: No  Gang Involvement:No   Subjective:   Patient working today in session on her anxiety, depression, grief, and struggling with setting limits with others. Is feeling she is progressing some in setting limits with others, especially brother (adhd) and has difficulty over-spending. Processing difficulty family situations today, some related to her relationship with them and needing to say No at times but it's very difficult. Also working on setting limits with others and shared several examples today where it was hard for her to say no, and realizing her need to say no without guilt. Healthier boundaries, she is trying to work further on, including her family as needed. Worries about self and other family, sometimes excessively worrying. Shared  experiences where is was obvious that she needed healthier boundaries. Talking and sharing further about her depression today and feeling that I'm not as depressed and am actually liking having some time alone. Feels she is better managing the grief over loss of mom 10 yrs ago. As she spoke about this more today, she realizes more how she is learning to cope better with that loss of her mother as well as able to feel her mom's presence, and letting herself cry some, with no guilt. Discussed this in more detail today which seemed helpful for patient even and some of her other relationships where she was not having guilt but was saying yes to avoid feeling guilt.  (Mostly in relationship to her brother.) Overall, is liking her retirement as this affords her a lot more opportunities with family and friends.  Continue to work on setting and keeping healthier boundaries, not allowing herself to be easily manipulated, decreasing her people pleasing behaviors, and noticing her positives more.    Interventions: Cognitive Behavioral Therapy, Solution-Oriented/Positive Psychology, and Ego-Supportive 1.  Work on unresolved grief issues from the past particularly regarding her mother's death, and identify life conflicts and/or situations including from the past and the present, that support current symptomology of anxiety/depression. 2.  Develop behavioral and cognitive strategies to reduce or eliminate excessive anxiety, depression.  Identify, challenge, and replace negative self-talk with more positive, realistic, and empowering self-talk. 3.  Patient will work on developing reality based, positive cognitive messages that can help her improve her mood and her outlook while also working  on building/strengthening self-confidence.   Diagnosis:   ICD-10-CM   1. Generalized anxiety disorder  F41.1      Plan:   Patient continues to work today on setting and keeping healthier boundaries particularly within  certain family relationships, not letting herself be so easily manipulated, self-esteem issues, depression, some grief, and anxiety.  Continues to work on improved boundaries and being able to maintain them.  Easily distracted at times and responds well to getting back on track in session.  Continue to work on her self-awareness, self-esteem, refrain from using poor boundaries, understand that boundaries are a healthy thing to have and not a negative.  Did better today and staying on track in session with a couple reminders from therapist.  Trying to make some healthier choices and having better insight on that as well as setting her boundaries and personal relationships and sticking with them which can be challenging however patient is continuing to work on this.  Remains very committed to her treatment goals and feels challenged by them, it seems to help motivate her at times.  Goal review and progress/challenges noted with patient.  Next appointment within 2 to 3 weeks.   Barnie Bunde, LCSW

## 2024-01-13 ENCOUNTER — Other Ambulatory Visit (HOSPITAL_BASED_OUTPATIENT_CLINIC_OR_DEPARTMENT_OTHER): Payer: Self-pay

## 2024-01-13 ENCOUNTER — Other Ambulatory Visit: Payer: Self-pay

## 2024-01-13 MED ORDER — ROSUVASTATIN CALCIUM 5 MG PO TABS
5.0000 mg | ORAL_TABLET | ORAL | 3 refills | Status: AC
Start: 2024-01-13 — End: ?
  Filled 2024-01-13: qty 13, 91d supply, fill #0

## 2024-01-13 MED ORDER — CHLORTHALIDONE 25 MG PO TABS
25.0000 mg | ORAL_TABLET | Freq: Every morning | ORAL | 3 refills | Status: AC
Start: 2024-01-13 — End: ?
  Filled 2024-01-13: qty 90, 90d supply, fill #0

## 2024-01-13 MED ORDER — VALSARTAN 320 MG PO TABS
320.0000 mg | ORAL_TABLET | Freq: Every day | ORAL | 3 refills | Status: AC
  Filled 2024-02-10 – 2024-03-21 (×2): qty 90, 90d supply, fill #0

## 2024-01-13 MED ORDER — VERAPAMIL HCL ER 300 MG PO CP24
300.0000 mg | ORAL_CAPSULE | Freq: Every day | ORAL | 3 refills | Status: AC
  Filled 2024-02-10: qty 90, 90d supply, fill #0
  Filled 2024-02-21: qty 13, 13d supply, fill #1

## 2024-01-17 ENCOUNTER — Encounter: Payer: Self-pay | Admitting: Physical Therapy

## 2024-01-17 ENCOUNTER — Ambulatory Visit: Admitting: Physical Therapy

## 2024-01-17 DIAGNOSIS — M25562 Pain in left knee: Secondary | ICD-10-CM | POA: Diagnosis not present

## 2024-01-17 DIAGNOSIS — M25561 Pain in right knee: Secondary | ICD-10-CM

## 2024-01-17 DIAGNOSIS — M5459 Other low back pain: Secondary | ICD-10-CM

## 2024-01-17 NOTE — Therapy (Signed)
 OUTPATIENT PHYSICAL THERAPY LOWER EXTREMITY TREATMENT   Patient Name: Rhonda Aguilar MRN: [id number] DOB:[birth year], 68 y.o., female Today's Date: 01/17/2024   END OF SESSION:  PT End of Session - 01/17/24 1221     Visit Number 10    Number of Visits 16    Date for Recertification  01/31/24    Authorization Type UHC  10 v through 02/28/24    PT Start Time 0940    PT Stop Time 1018    PT Time Calculation (min) 38 min    Activity Tolerance Patient tolerated treatment well    Behavior During Therapy [facility] for tasks assessed/performed               Past Medical History:  Diagnosis Date   Anxiety    Arthritis    Benign essential HTN 11/22/2014   Cervical lymphadenopathy 06/01/2022   Diabetes mellitus, type II ([name])    Dyspnea    On exertion   Fatty liver    GERD (gastroesophageal reflux disease)    Hypertension    IBS (irritable bowel syndrome)    Migraine    Obesity (BMI 30-39.9) 11/22/2014   OSA (obstructive sleep apnea)    Pneumonia    Past Surgical History:  Procedure Laterality Date   BREAST BIOPSY Right 2014   CHOLECYSTECTOMY N/A 09/14/2019   Procedure: LAPAROSCOPIC CHOLECYSTECTOMY;  Surgeon: Signe Mitzie LABOR, MD;  Location: [facility];  Service: General;  Laterality: N/A;   COLONOSCOPY     TONSILLECTOMY     Patient Active Problem List   Diagnosis Date Noted   Grief 06/01/2023   Seasonal affective disorder 03/30/2023   Anxiety 02/21/2023   Cervical lymphadenopathy 06/01/2022   BMI 40.0-44.9, adult ([name]) 04/21/2022   Stress 04/21/2022   Obesity, Beginning BMI 42.25 04/21/2022   Constipation 02/23/2022   Hypertension associated with type 2 diabetes mellitus ([name]) 12/07/2021   Diabetes mellitus ([name]) 12/07/2021   Hyperlipidemia associated with type 2 diabetes mellitus ([name]) 12/07/2021   SOBOE (shortness of breath on exertion) 02/09/2021   Type 2 diabetes mellitus with hyperlipidemia ([name]) 02/09/2021   Vitamin D  deficiency 04/12/2018   Metabolic  dysfunction-associated steatotic liver disease (MASLD) 03/22/2018   Depression 03/22/2018   Intractable hemiplegic migraine with status migrainosus 01/17/2018   Intractable migraine with aura with status migrainosus 01/17/2018   Ocular migraine 12/12/2017   Class 3 severe obesity with serious comorbidity and body mass index (BMI) of 40.0 to 44.9 in adult ([name]) 12/12/2017   Other fatigue 12/08/2017   Shortness of breath on exertion 12/08/2017   Type 2 diabetes mellitus without complication, without long-term current use of insulin  ([name]) 12/08/2017   OSA on CPAP 12/08/2017   Low HDL (under 40) 09/10/2016   Metabolic syndrome 09/10/2016   Primary hypertension 02/05/2016   Gastroesophageal reflux disease 02/05/2016   Other hyperlipidemia 02/05/2016   DOE (dyspnea on exertion) 11/22/2014   Benign essential HTN 11/22/2014   OSA (obstructive sleep apnea)     PCP: Lamarr Rotunda  REFERRING PROVIDER: Artist Lloyd   REFERRING DIAG: Bil knee pain, low back pain   THERAPY DIAG:  Acute pain of left knee  Acute pain of right knee  Other low back pain  Rationale for Evaluation and Treatment: Rehabilitation  ONSET DATE:    SUBJECTIVE:   SUBJECTIVE STATEMENT: Pt states doing well, noticing very good improvements in her mobility  Eval: Pt states Bil knee pain and Low back pain - comes and goes  Has stationary bike Was squatting  and cleaning more recently, then went up stairs-  knee with increased pain, felt like it was going to give out.   Most pain felt with standing- in knees and back, Walking not as bad.  L knee was hurting but has improved some. she wants to be more active in prep for trip in May. Did sign up for water aerobics.  Pt is retired engineer, civil (consulting).  L hip pain comes and goes with standing, shopping   PERTINENT HISTORY: HTN, DM 2, SOB with exertion (more with bending over) -has negative cardiac tests.   PAIN:  Are you having pain? Yes: NPRS scale: 3-4 /10 Pain location:  L knee  Pain description: sore Aggravating factors: standing  Relieving factors: rest  Are you having pain? Yes: NPRS scale: 0-1 /10 Pain location: R knee  Pain description: sore Aggravating factors: standing Relieving factors: rest  Are you having pain? Yes: NPRS scale: up to 4/10 Pain location: back ,  L low back pain  Pain description: sore Aggravating factors: standing  Relieving factors: rest  PRECAUTIONS: None  WEIGHT BEARING RESTRICTIONS: No  FALLS:  Has patient fallen in last 6 months? No   PLOF: Independent  PATIENT GOALS:  Decreased pain in knees, back, increase exercise.   NEXT MD VISIT:   OBJECTIVE:   DIAGNOSTIC FINDINGS: 1. Osteopenia and degenerative change without evidence of fractures. 2. Mild lumbar levorotary scoliosis. 3. New mild grade 1 anterolisthesis at L5-S1, most probably due to disproportionate facet hypertrophy at this level. 4. Mild features of tricompartmental degenerative arthrosis of the right knee with small suprapatellar bursal effusion. 5. Progressive bone-on-bone joint space loss along the medial femorotibial joint of the left knee with reactive osteophytes. 6. Small suprapatellar bursal effusion of the left knee.   PATIENT SURVEYS:    THE PATIENT SPECIFIC FUNCTIONAL SCALE  Place score of 0-10 (0 = unable to perform activity and 10 = able to perform activity at the same level as before injury or problem)  Activity Date: 12/06/23 12/27/23 01/17/24   Getting up/down from floor        3       8      8   2.   Up/down stairs without hand support        2       8    10   3.   Standing 30 min        0       6     10       Total Score 1.66 7.33 9.33    Total Score = Sum of activity scores/number of activities  Minimally Detectable Change: 3 points (for single activity); 2 points (for average score)  [facility] (nd). The Patient Specific Functional Scale . Retrieved from  [email or web address]   COGNITION: Overall cognitive status: Within functional limits for tasks assessed     SENSATION: WFL  EDEMA: n/a   POSTURE:    No Significant postural limitations  PALPATION: Tenderness in L low back, into SI and some into glute Mild tenderness at L medial knee.    LOWER EXTREMITY ROM: Knees: WFL Hips: WFL Lumbar: mild limitation for SB and rotation.   LOWER EXTREMITY MMT:  MMT Left D/c Right  D/c  Hip flexion 4+ 4+  Hip extension    Hip abduction 4+ 4+  Hip adduction    Hip internal rotation    Hip external rotation    Knee flexion 5 5  Knee extension  5 5  Ankle dorsiflexion    Ankle plantarflexion    Ankle inversion    Ankle eversion     (Blank rows = not tested)  LOWER EXTREMITY SPECIAL TESTS:    GAIT: Comments: unremarkable    TODAY'S TREATMENT:                                                                                                                              DATE:   01/17/2024 Therapeutic Exercise: Aerobic: Supine:      bridging 2 x 8 ;  SKTC x 2 btwn bridging ; LTR x 10; pelvic tilts x 10;  Seated:   Standing:    hip abd 2 x 10 bil;  Stretches:  Neuromuscular Re-education: Manual Therapy:   Therapeutic Activity: Slow march with 10lb hold x 20;  Sit to stand  x 10  Squats  2 x 5  10 lb  Weighted carry/walk,, 10 lb unilateral hold 45 ft x 4 ea;  March/walk 10 lb hold x 4 ea;  Self Care:    Therapeutic Exercise: Aerobic: Supine:   SLR x 10 , in/out x 10 bil with cueing for core contraction;   bridging 2 x 8 ; SKTC x 2 btwn bridging ; LTR x 10;  Seated:   Standing:     Stretches:  Supine piriformis x 3 bil;  Neuromuscular Re-education: Manual Therapy:   Therapeutic Activity: Sit to stand mat table 2 x 10 , 10 lb  Up/down 5 steps no UE support, x 8 Weighted carry/walk 15lb unilateral hold x 4 ea 15lb KB double arm hold x 4 Self Care:    PATIENT  EDUCATION:  Education details: reviewed and issued initial HEP Person educated: Patient Education method: Explanation, Demonstration, Tactile cues, Verbal cues, and Handouts Education comprehension: verbalized understanding, returned demonstration, verbal cues required, tactile cues required, and needs further education   HOME EXERCISE PROGRAM: [facility]   ASSESSMENT:  CLINICAL IMPRESSION:  Pt progressing very well and has met goals at this time. She has much improved strength and tolerance/endurance for activity. We discussed importance of continuing mobility and strengthening for overall best outcome for the next few months. She is also attending water aerobics. Final HEP reviewed today, pt ready for d/c , pt in agreement with plan.    Eval: Patient presents with primary complaint of  pain in knees and back. Pt overall deconditioned and will benefit from progressing into exercise routine for  improving strength and endurance. She has worsening OA in L knee per imaging, but pain only mild at this time. She has decreased confidence with stairs and dynamic activity due to instability and previous pain. Pt with lack of effective HEP for ongoing pain in back and knees . Pt with decreased ability for full functional activities. Pt will  benefit from skilled PT to improve deficits and pain and to return to PLOF.   OBJECTIVE IMPAIRMENTS: decreased activity tolerance, decreased mobility, decreased strength,  increased muscle spasms, improper body mechanics, and pain.   ACTIVITY LIMITATIONS: carrying, lifting, standing, squatting, stairs, and locomotion level  PARTICIPATION LIMITATIONS: meal prep, cleaning, laundry, driving, shopping, community activity, and yard work  PERSONAL FACTORS: Time since onset of injury/illness/exacerbation are also affecting patient's functional outcome.   REHAB POTENTIAL: Good  CLINICAL DECISION MAKING: Stable/uncomplicated  EVALUATION COMPLEXITY:  Low   GOALS: Goals reviewed with patient? Yes  SHORT TERM GOALS: Target date: 12/27/2023  Pt to be independent with initial HEP  Goal status: MET  2.  Pt to demo optimal mechanics for bend/squat for IADLS.   Goal status: MET   LONG TERM GOALS: Target date: 01/31/2024   Pt to be independent with final HEP  Goal status: MET  2.  Pt to demo ability for stairs with reciprocal gait and 1 hand rail or less, to improve ability for community activity.   Goal status: iMET  3.  Pt to report decreased pain in knees and back to 0-3/10 with standing activity for at least 30 min.   Goal status: MET  4.  Pt to demo strength of knees and hips to be at least 4+/5, to improve stability and pain.   Goal status: MET  5.  Pt to be complaint with walking program, up to 30 min, 3x/wk to improve endurance and stamina  Goal status: in progress    PLAN:  PT FREQUENCY: 1-2x/week  PT DURATION: 8 weeks  PLANNED INTERVENTIONS: Therapeutic exercises, Therapeutic activity, Neuromuscular re-education, Patient/Family education, Self Care, Joint mobilization, Joint manipulation, Stair training, Orthotic/Fit training, DME instructions, Aquatic Therapy, Dry Needling, Electrical stimulation, Cryotherapy, Moist heat, Taping, Ultrasound, Ionotophoresis 4mg /ml Dexamethasone , Manual therapy,  Vasopneumatic device, Traction, Spinal manipulation, Spinal mobilization,Balance training, Gait training,   PLAN FOR NEXT SESSION:       Tinnie Don, PT, DPT 12:22 PM  01/17/24   PHYSICAL THERAPY DISCHARGE SUMMARY  Visits from Start of Care: 10  Plan: Patient agrees to discharge.  Patient goals were  met. Patient is being discharged due to meeting the stated rehab goals.     Tinnie Don, PT, DPT 12:22 PM  01/17/24

## 2024-01-23 ENCOUNTER — Ambulatory Visit: Admitting: Family Medicine

## 2024-01-23 VITALS — BP 122/80 | HR 69 | Ht 62.0 in | Wt 220.0 lb

## 2024-01-23 DIAGNOSIS — M25561 Pain in right knee: Secondary | ICD-10-CM

## 2024-01-23 DIAGNOSIS — M545 Low back pain, unspecified: Secondary | ICD-10-CM

## 2024-01-23 DIAGNOSIS — G8929 Other chronic pain: Secondary | ICD-10-CM

## 2024-01-23 DIAGNOSIS — M25562 Pain in left knee: Secondary | ICD-10-CM | POA: Diagnosis not present

## 2024-01-23 NOTE — Patient Instructions (Signed)
 Thank you for coming in today.   Continue working on your exercises at home

## 2024-01-23 NOTE — Progress Notes (Signed)
   Rhonda Ileana Collet, PhD, LAT, ATC acting as a scribe for Rhonda Lloyd, MD.  Rhonda Aguilar is a 68 y.o. female who presents to [facility] today for 48-month f/u bilat knee and low back pain. Pt was last seen by Dr. Lloyd on 11/21/23 and was referred to PT, completing 10 visits.  Today, pt reports bilat knees are feeling much better. She is able to normally go up/down the stairs. She notes overall improvement in her LE strength.   Dx imaging: 11/21/23 R & L knee and L-spine XR  Pertinent review of systems: No fevers or chills  Relevant historical information: Hypertension   Exam:  BP 122/80   Pulse 69   Ht 5' 2 (1.575 m)   Wt 220 lb (99.8 kg)   SpO2 96%   BMI 40.24 kg/m  General: Well Developed, well nourished, and in no acute distress.   MSK: Knees bilaterally normal motion.  Normal gait    Lab and Radiology Results No results found for this or any previous visit (from the past 72 hours). No results found.     Assessment and Plan: 68 y.o. female with knee and back pain.  Much improved with physical therapy.  Plan to continue home exercise program.  Consider steroid injections if needed in the future.  She will be traveling to [place] and [place] in May.  Okay to prescribe prednisone or pain medicine prior to that visit if needed.   PDMP not reviewed this encounter. No orders of the defined types were placed in this encounter.  No orders of the defined types were placed in this encounter.    Discussed warning signs or symptoms. Please see discharge instructions. Patient expresses understanding.   The above documentation has been reviewed and is accurate and complete Rhonda Aguilar, M.D.

## 2024-01-26 ENCOUNTER — Ambulatory Visit: Admitting: Psychiatry

## 2024-01-26 DIAGNOSIS — F411 Generalized anxiety disorder: Secondary | ICD-10-CM

## 2024-01-26 NOTE — Progress Notes (Signed)
 [facility] Counselor/Therapist Progress Note  Patient ID: Rhonda Aguilar, MRN: [id number],    Date: 01/26/2024  Time Spent: 53 minutes   Treatment Type: Individual Therapy  Reported Symptoms: anxiety, worrying, difficulty setting limits with others and myself, Not feeling as burned-out at times   Mental Status Exam:  Appearance:   Casual and Neat     Behavior:  Appropriate, Sharing, and Motivated  Motor:  Normal  Speech/Language:   Clear and Coherent  Affect:  anxious  Mood:  anxious  Thought process:  normal  Thought content:    Rumination  Sensory/Perceptual disturbances:    WNL  Orientation:  oriented to person, place, time/date, situation, day of week, month of year, year, and stated date of Dec. 4, 2025  Attention:  Good  Concentration:  Good  Memory:  WNL  Fund of knowledge:   Good  Insight:    Good  Judgment:   Good  Impulse Control:  Good   Risk Assessment: Danger to Self:  No Self-injurious Behavior: No Danger to Others: No Duty to Warn:no Physical Aggression / Violence:No  Access to Firearms a concern: No  Gang Involvement:No   Subjective: Patient today focusing more on her anxiety (main symptom), worrying, improved in depression, grief, overwhelmed at times, and struggling with setting limits with other people.  Has been taking some of her retirement time recently in doing some craft activities for gift including very colorful potholders, which she has been enjoying recently. Has been working also on setting healthier limits including within the family (son). Talked through her feelings on this in session and working to stay more focused on her anxiety and how she is wanting to work on it, but finds it difficult.  Gets stressed quite easily and worked on this today to help her become less stressed, and better able to focus.  She responded well to this and also shared some of what all has gone on today that has added to her stress and anxiety before she ever  got to her appointment here.  To do some writing in between now and next session to help her better focus on her anxiety and exactly how she experiences it including some additional details that she made in her notes.  To complete homework between now and next session and bring it in with her and be more intentional in talking, working hard to stay on track with what is most important for her to work on in session.  This talking through at end of session today seemed to be more helpful to her and also I think the writing assignment in between sessions will help.  Continues to work on saying no when she needs to at times and while that is a challenge she has done it a couple times since last appointment.  Still dealing with some family challenges.  Working to not say yes just to avoid feeling guilty within the family.  Continues to enjoy retirement and all the opportunities that she is afforded.  Encouraged her to continue working on her people pleasing behaviors and having healthier boundaries  Interventions: Cognitive Behavioral Therapy, Solution-Oriented/Positive Psychology, and Ego-Supportive 1.  Work on unresolved grief issues from the past particularly regarding her mother's death, and identify life conflicts and/or situations including from the past and the present, that support current symptomology of anxiety/depression. 2.  Develop behavioral and cognitive strategies to reduce or eliminate excessive anxiety, depression.  Identify, challenge, and replace negative self-talk with more  positive, realistic, and empowering self-talk. 3.  Patient will work on developing reality based, positive cognitive messages that can help her improve her mood and her outlook while also working on teaching laboratory technician.   Diagnosis:   ICD-10-CM   1. Generalized anxiety disorder  F41.1       Plan:    Patient in session today reporting and working on having healthier boundaries especially  within certain family relationships, working to improve her self-esteem, trying to decrease her depression and some grief, and working to decrease her anxiety.  Has progressed some on improving her boundaries and more importantly working to maintain them.  Feels that they are a tool that also helps her in her work to increase self-awareness.  She shares that it is easier for her to get distracted at times and even in sessions we have worked to keep her focused and on track as she works on multiple issues.  Getting a little more used to the idea that boundaries are a positive thing and not a negative.  Boundaries for herself and others.  Discussed further today patient's efforts and making some healthier choices and noticing improved insight in her boundary-setting and setting boundaries in personal relationships.  Is committed to treatment goals and experiences them as being both motivating and challenging.  Goal review and progress/challenges noted with patient.  Next appointment within 2 to 3 weeks.   Barnie Bunde, LCSW

## 2024-01-31 ENCOUNTER — Ambulatory Visit (INDEPENDENT_AMBULATORY_CARE_PROVIDER_SITE_OTHER): Payer: Self-pay | Admitting: Family Medicine

## 2024-01-31 ENCOUNTER — Encounter (INDEPENDENT_AMBULATORY_CARE_PROVIDER_SITE_OTHER): Payer: Self-pay | Admitting: Family Medicine

## 2024-01-31 ENCOUNTER — Other Ambulatory Visit (HOSPITAL_BASED_OUTPATIENT_CLINIC_OR_DEPARTMENT_OTHER): Payer: Self-pay

## 2024-01-31 VITALS — BP 123/65 | HR 74 | Temp 97.7°F | Ht 62.0 in | Wt 218.0 lb

## 2024-01-31 DIAGNOSIS — E1169 Type 2 diabetes mellitus with other specified complication: Secondary | ICD-10-CM

## 2024-01-31 DIAGNOSIS — Z6839 Body mass index (BMI) 39.0-39.9, adult: Secondary | ICD-10-CM

## 2024-01-31 DIAGNOSIS — E559 Vitamin D deficiency, unspecified: Secondary | ICD-10-CM

## 2024-01-31 MED ORDER — MOUNJARO 12.5 MG/0.5ML ~~LOC~~ SOAJ
12.5000 mg | SUBCUTANEOUS | 0 refills | Status: DC
  Filled 2024-01-31: qty 2, 28d supply, fill #0

## 2024-01-31 MED ORDER — VITAMIN D (ERGOCALCIFEROL) 1.25 MG (50000 UNIT) PO CAPS
50000.0000 [IU] | ORAL_CAPSULE | ORAL | 0 refills | Status: AC
  Filled 2024-01-31 – 2024-03-07 (×2): qty 12, 84d supply, fill #0

## 2024-01-31 NOTE — Progress Notes (Signed)
 Office: [phone]  /  Fax: [phone]  WEIGHT SUMMARY AND BIOMETRICS  Anthropometric Measurements Height: 5' 2 (1.575 m) Weight: 218 lb (98.9 kg) BMI (Calculated): 39.86 Weight at Last Visit: 219 lb Weight Lost Since Last Visit: 1 lb Weight Gained Since Last Visit: 0 Starting Weight: 231 lb Total Weight Loss (lbs): 13 lb (5.897 kg) Peak Weight: 235 lb   Body Composition  Body Fat %: 49.2 % Fat Mass (lbs): 107.4 lbs Muscle Mass (lbs): 105.4 lbs Total Body Water (lbs): 81 lbs Visceral Fat Rating : 17   Other Clinical Data Fasting: no Labs: no Today's Visit #: 19 Starting Date: 12/08/17    Chief Complaint: OBESITY    History of Present Illness Rhonda Aguilar is a 67 year old female with obesity, vitamin D  deficiency, and type 2 diabetes who presents for obesity treatment and progress assessment.  She is adhering to a dietary plan, either a category two plan or a journaling plan, aiming for 1200 calories and 80 or more grams of protein daily, achieving these goals 80-85% of the time. She is increasing her intake of fruits and vegetables, consuming the recommended amount of protein, and hydrating adequately. She has lost one pound over the last three weeks.  Her most recent hemoglobin A1c was 6.3, down from 6.6. Fasting glucose is around 137 mg/dL. She experiences occasional headaches, managed with Xanax  at bedtime, and constipation, for which she uses FiberCon and Benefiber gummies.  She is treated for vitamin D  deficiency with prescription ergocalciferol , 50,000 IU weekly. Her most recent vitamin D  level was 51.7, tested on January 12, 2024.  She recently completed physical therapy for chronic back pain, which has improved. Her knee is better, and she is able to walk for exercise two days a week for 10-15 minutes. She experienced shortness of breath after walking uphill but did not stop to rest.  Recent lab results show a normal microalbumin level, a TSH of  2.83, and liver enzymes with ALT at 67 and AST at 39. Cholesterol levels include a total cholesterol of 202, triglycerides of 201, and HDL of 53.  She generally sleeps 7-9 hours per night and has adjusted her diet to include more protein shakes and lighter foods. She reports a decreased appetite and a change in food preferences since starting Mounjaro .      PHYSICAL EXAM:  Blood pressure 123/65, pulse 74, temperature 97.7 F (36.5 C), height 5' 2 (1.575 m), weight 218 lb (98.9 kg), SpO2 95%. Body mass index is 39.87 kg/m.  DIAGNOSTIC DATA REVIEWED:  BMET    Component Value Date/Time   NA 139 07/07/2023 0000   K 3.8 07/07/2023 0000   CL 104 07/07/2023 0000   CO2 28 (A) 07/07/2023 0000   GLUCOSE 118 (H) 11/26/2021 0919   GLUCOSE 192 (H) 09/05/2019 1030   BUN 17 07/07/2023 0000   CREATININE 0.9 07/07/2023 0000   CREATININE 0.75 11/26/2021 0919   CALCIUM  9.3 07/07/2023 0000   GFRNONAA 71 04/08/2020 1012   GFRAA 81 04/08/2020 1012   Lab Results  Component Value Date   HGBA1C 6.9 07/07/2023   HGBA1C 6.7 (H) 04/19/2019   Lab Results  Component Value Date   INSULIN  37.9 (H) 11/26/2021   INSULIN  38.2 (H) 12/08/2017   Lab Results  Component Value Date   TSH 2.15 01/06/2023   CBC    Component Value Date/Time   WBC 7.8 01/06/2023 0000   WBC 7.6 09/05/2019 1030   RBC 5.12 (A) 01/06/2023  0000   HGB 14.2 01/06/2023 0000   HGB 12.9 12/08/2017 1306   HCT 43 01/06/2023 0000   HCT 38.1 12/08/2017 1306   PLT 242 01/06/2023 0000   MCV 85.0 09/05/2019 1030   MCV 82 12/08/2017 1306   MCH 27.1 09/05/2019 1030   MCHC 31.9 09/05/2019 1030   RDW 15.4 09/05/2019 1030   RDW 14.0 12/08/2017 1306   Iron Studies No results found for: IRON, TIBC, FERRITIN, IRONPCTSAT Lipid Panel     Component Value Date/Time   CHOL 217 (A) 01/06/2023 0000   CHOL 195 11/26/2021 0919   TRIG 268 (A) 01/06/2023 0000   HDL 47 01/06/2023 0000   HDL 44 11/26/2021 0919   CHOLHDL 4.4  11/26/2021 0919   LDLCALC 111 (H) 11/26/2021 0919   Hepatic Function Panel     Component Value Date/Time   PROT 6.5 11/26/2021 0919   ALBUMIN 4.4 07/07/2023 0000   ALBUMIN 4.6 11/26/2021 0919   AST 44 (A) 07/07/2023 0000   ALT 77 (A) 07/07/2023 0000   ALKPHOS 71 07/07/2023 0000   BILITOT 0.4 11/26/2021 0919      Component Value Date/Time   TSH 2.15 01/06/2023 0000   TSH 2.550 12/08/2017 1306   Nutritional Lab Results  Component Value Date   VD25OH 39.6 01/06/2023   VD25OH 34.5 11/26/2021   VD25OH 42.3 02/09/2021     Assessment and Plan Assessment & Plan Obesity She is adhering to a dietary plan with 1200 calories and 80+ grams of protein, meeting goals 80-85% of the time. She is increasing fruit and vegetable intake, hydrating adequately, and sleeping 7-9 hours per night. She exercises by walking 10-15 minutes twice a week. She has lost 1 pound over the last three weeks. She is tolerating Mounjaro  well, with occasional headaches managed by Xanax . Constipation is managed with FiberCon and Benefiber gummies, with concerns about potential side effects. She is cautious about increasing fiber intake due to potential adverse effects. - Continue current dietary plan with 1200 calories and 80+ grams of protein. - Encouraged increased intake of fruits and vegetables. - Continue walking for exercise twice a week. - Continue Mounjaro  12.5 mg. - Start with half a dose of FiberCon if needed for constipation.  Type 2 diabetes mellitus with hyperlipidemia Type 2 diabetes is managed with diet, exercise, weight loss, and Mounjaro . Her most recent hemoglobin A1c was 6.9 in May, improved to 6.3. Morning glucose levels are in the 130s, with fasting glucose at 137. Liver enzymes are improving, with ALT at 67 and AST at 39. Hyperlipidemia is managed with Crestor , with total cholesterol at 202 and HDL at 53. She is considering Repatha or Zetia  due to family history and intolerance to statins. -  Continue Mounjaro  12.5 mg. - Continue Crestor . - Will consider Repatha or Zetia  if needed.  Vitamin D  deficiency Managed with ergocalciferol  50,000 IU weekly. Her most recent vitamin D  level was 51.7, the highest recorded. She requests a refill of ergocalciferol . - Continue ergocalciferol  50,000 IU weekly. - Refilled ergocalciferol  prescription.      Patients who are on anti-obesity medications are counseled on the importance of maintaining healthy lifestyle habits, including balanced nutrition, regular physical activity, and behavioral modifications,  Medication is an adjunct to, not a replacement for, lifestyle changes and that the long-term success and weight maintenance depend on continued adherence to these strategies.   Keyly was informed of the importance of frequent follow up visits to maximize her success with intensive lifestyle modifications for her  obesity and obesity related health conditions as recommended by USPSTF and CMS guidelines   Louann Penton, MD

## 2024-02-02 ENCOUNTER — Other Ambulatory Visit (HOSPITAL_BASED_OUTPATIENT_CLINIC_OR_DEPARTMENT_OTHER): Payer: Self-pay

## 2024-02-08 ENCOUNTER — Ambulatory Visit: Admitting: Psychiatry

## 2024-02-08 DIAGNOSIS — F411 Generalized anxiety disorder: Secondary | ICD-10-CM

## 2024-02-08 NOTE — Progress Notes (Signed)
 [facility] Counselor/Therapist Progress Note  Patient ID: JAYLEE FREEZE, MRN: [id number],    Date: 02/08/2024  Time Spent: 53 minutes   Treatment Type: Individual Therapy  Reported Symptoms: Anxiety, worrying, difficulty setting limits with others and herself, situational depression    Mental Status Exam:  Appearance:   Casual and Neat     Behavior:  Appropriate, Sharing, and Motivated  Motor:  Normal  Speech/Language:   Clear and Coherent  Affect:  Anxious, some depression  Mood:  anxious and some depression and overwhelmed and needing to set limits on herself  Thought process:  goal directed  Thought content:    Rumination  Sensory/Perceptual disturbances:    WNL  Orientation:  oriented to person, place, time/date, situation, day of week, month of year, year, and stated date of Dec. 17, 2025  Attention:  Good  Concentration:  Good  Memory:  WNL  Fund of knowledge:   Good  Insight:    Good  Judgment:   Good  Impulse Control:  Good   Risk Assessment: Danger to Self:  No Self-injurious Behavior: No Danger to Others: No Duty to Warn:no Physical Aggression / Violence:No  Access to Firearms a concern: No  Gang Involvement:No   Subjective: Patient today working further on her worrying, depression, grief, anxiety and feeling overwhelmed but is improving.  Working to have healthier boundaries with others. Notices that she still stresses quite easily but is working to calm myself and quiet this more. Notes that she is not holding onto anger as much and working to continue letting it go.  Does well in session and stating and processing stressful events, interactions with others, and also how she sometimes stresses herself unintentionally.  Finding some hobbies that she is enjoying during this time of colder weather and not want to be outside is much.  Crafting some for the holidays and staying in touch with family and friends.  Trying to decrease her stress episodes and  feels that she has made some progress.  Uses writing as a tool to focus on some of her goals between sessions.  Shares that she is trying to stay on track with things that are most important with her to work on in her sessions and she does follow through.  Trying to say no when she needs to, which can be a challenge for her at times depending on who she is saying no 2.  Towards end of session, we try to review a bit as this seems to help patient and staying on track and focused with neck steps.  Still working on trying to reduce some people pleasing behaviors including having healthier boundaries, and being able to make certain decisions on her own.  Interventions: Cognitive Behavioral Therapy, Solution-Oriented/Positive Psychology, and Ego-Supportive 1.  Work on unresolved grief issues from the past particularly regarding her mother's death, and identify life conflicts and/or situations including from the past and the present, that support current symptomology of anxiety/depression. 2.  Develop behavioral and cognitive strategies to reduce or eliminate excessive anxiety, depression.  Identify, challenge, and replace negative self-talk with more positive, realistic, and empowering self-talk. 3.  Patient will work on developing reality based, positive cognitive messages that can help her improve her mood and her outlook while also working on teaching laboratory technician.    Diagnosis:   ICD-10-CM   1. Generalized anxiety disorder  F41.1       Plan:   Patient working today in session on family relationships,  having healthier boundaries, improving her own self-esteem, working to decrease her depression and better manage grief, and decreasing her anxiety.  Has shown progress and continues to work on her goals, noticing when she gets off track and is able to get herself back on track.  Working to increase self-awareness and is progressing with that.  Trying to have healthy boundaries and  accepting the fact that they are a positive thing and not a negative per earlier discussions.  Also focusing on weighing options and making healthier choices, trying to be more insightful and setting appropriate boundaries and personal relationships, and remain committed to her treatment goals as she seeks to move forward.  Goal review and progress/challenges noted with patient.  Next appointment within 2 to 3 weeks.   Barnie Bunde, LCSW

## 2024-02-10 ENCOUNTER — Other Ambulatory Visit: Payer: Self-pay

## 2024-02-10 ENCOUNTER — Other Ambulatory Visit (HOSPITAL_BASED_OUTPATIENT_CLINIC_OR_DEPARTMENT_OTHER): Payer: Self-pay

## 2024-02-13 ENCOUNTER — Other Ambulatory Visit (HOSPITAL_BASED_OUTPATIENT_CLINIC_OR_DEPARTMENT_OTHER): Payer: Self-pay

## 2024-02-21 ENCOUNTER — Other Ambulatory Visit (HOSPITAL_BASED_OUTPATIENT_CLINIC_OR_DEPARTMENT_OTHER): Payer: Self-pay

## 2024-02-22 ENCOUNTER — Ambulatory Visit: Admitting: Psychiatry

## 2024-02-22 ENCOUNTER — Other Ambulatory Visit (HOSPITAL_BASED_OUTPATIENT_CLINIC_OR_DEPARTMENT_OTHER): Payer: Self-pay

## 2024-02-22 DIAGNOSIS — F411 Generalized anxiety disorder: Secondary | ICD-10-CM | POA: Diagnosis not present

## 2024-02-22 MED ORDER — VERAPAMIL HCL ER 240 MG PO TBCR
240.0000 mg | EXTENDED_RELEASE_TABLET | Freq: Every day | ORAL | 0 refills | Status: AC
  Filled 2024-02-22: qty 90, 90d supply, fill #0

## 2024-02-22 NOTE — Progress Notes (Signed)
 "       [facility] Counselor/Therapist Progress Note  Patient ID: Rhonda Aguilar, MRN: [id number],    Date: 02/22/2024  Time Spent: 58 minutes   Treatment Type: Individual Therapy  Reported Symptoms:   anxiety, worrying, situational depression improving, difficulty setting limits with others and herself    Mental Status Exam:  Appearance:   Casual and Neat     Behavior:  Appropriate, Sharing, and Motivated  Motor:  Normal  Speech/Language:   Clear and Coherent  Affect:  Depressed and anxiety but some improvement  Mood:  anxious and depressed  Thought process:  goal directed  Thought content:    WNL  Sensory/Perceptual disturbances:    WNL  Orientation:  oriented to person, place, time/date, situation, day of week, month of year, year, and stated date of Dec. 31, 2025  Attention:  Good  Concentration:  Good  Memory:  WNL  Fund of knowledge:   Good  Insight:    Good  Judgment:   Good  Impulse Control:  Good   Risk Assessment: Danger to Self:  No Self-injurious Behavior: No Danger to Others: No Duty to Warn:no Physical Aggression / Violence:No  Access to Firearms a concern: No  Gang Involvement:No   Subjective:  Patient today and continues to work on her worrying, anxiety, situational depression which is improving some, difficulty setting limits with others and herself at times. Seeing some success in her working on healthier boundaries with others as we have worked on previously. Continues work on healthier boundaries and finding that she can calm herself but I have to work at it, and am doing this. Processing today some recent family events where there were several issues/behaviors that patient has been bothered by and needed to vent and process her concerns in session today. Saw a lot of things recently while with family that I was not as aware of and now. Talking through     Notes that she is not holding onto anger as much and working to continue letting it go.   Does well in session and stating and processing stressful events, interactions with others, and also how she sometimes stresses herself unintentionally.  Finding some hobbies that she is enjoying during this time of colder weather and not want to be outside is much.  Crafting some for the holidays and staying in touch with family and friends.  Trying to decrease her stress episodes and feels that she has made some progress.  Uses writing as a tool to focus on some of her goals between sessions.  Shares that she is trying to stay on track with things that are most important with her to work on in her sessions and she does follow through.  Trying to say no when she needs to, which can be a challenge for her at times depending on who she is saying no 2.  Towards end of session, we try to review a bit as this seems to help patient and staying on track and focused with neck steps.  Still working on trying to reduce some people pleasing behaviors including having healthier boundaries, and being able to make certain decisions on her own.    Interventions: Cognitive Behavioral Therapy, Solution-Oriented/Positive Psychology, and Ego-Supportive 1.  Work on unresolved grief issues from the past particularly regarding her mother's death, and identify life conflicts and/or situations including from the past and the present, that support current symptomology of anxiety/depression. 2.  Develop behavioral and cognitive strategies to reduce or  eliminate excessive anxiety, depression.  Identify, challenge, and replace negative self-talk with more positive, realistic, and empowering self-talk. 3.  Patient will work on developing reality based, positive cognitive messages that can help her improve her mood and her outlook while also working on teaching laboratory technician.   Diagnosis:   ICD-10-CM   1. Generalized anxiety disorder  F41.1       Plan: Patient following up today in continued work on family  stressors and relationships, her self-esteem, improved boundaries, trying to decrease her depression and anxiety, and having better boundaries and managing her grief issues in healthier ways.  Acknowledged some grief during the holiday time but also some struggles in relationships within family and recognizing the need to have clear boundaries in place, and not to take on extra worries when situations in family and friendships are unclear.  Self-esteem is some better as she continues to work on improving it.  Continued good work on her goals and trying to stay on track even when unexpected things could bump her off track.  Self-awareness increased.  Working on healthier boundaries and trying to see them as a positive versus negative.  Sees a goal into her new year as being some healthy weight reduction and making healthier choices.  Insight improving.  Working on having personal boundaries that are more appropriate.  Goal review and progress/challenges noted with patient.  Next appointment within 2 to 3 weeks.   Barnie Bunde, LCSW                   "

## 2024-02-25 ENCOUNTER — Other Ambulatory Visit (HOSPITAL_BASED_OUTPATIENT_CLINIC_OR_DEPARTMENT_OTHER): Payer: Self-pay

## 2024-03-06 ENCOUNTER — Ambulatory Visit: Admitting: Psychiatry

## 2024-03-06 DIAGNOSIS — F411 Generalized anxiety disorder: Secondary | ICD-10-CM

## 2024-03-06 NOTE — Progress Notes (Signed)
 "       [facility] Counselor/Therapist Progress Note  Patient ID: Rhonda Aguilar, MRN: [id number],    Date: 03/06/2024  Time Spent: 53 minutes   Treatment Type: Individual Therapy  Reported Symptoms: anxiety, depression, worrying, difficulty setting limits with others and herself    Mental Status Exam:  Appearance:   Well Groomed     Behavior:  Appropriate, Sharing, and Motivated  Motor:  Normal  Speech/Language:   Clear and Coherent  Affect:  Appropriate  Mood:  anxious and some depression  Thought process:  goal directed  Thought content:    WNL  Sensory/Perceptual disturbances:    WNL  Orientation:  oriented to person, place, time/date, situation, day of week, month of year, year, and stated date of Jan. 13, 2026  Attention:  Good  Concentration:  Good  Memory:  WNL  Fund of knowledge:   Good  Insight:    Good  Judgment:   Good  Impulse Control:  Good   Risk Assessment: Danger to Self:  No Self-injurious Behavior: No Danger to Others: No Duty to Warn:no Physical Aggression / Violence:No  Access to Firearms a concern: No  Gang Involvement:No   Subjective:  Patient today in session reporting symptoms of anxiety, depression, worrying, and difficulty setting healthier boundaries especially in relationships and beyond. Working on some interpersonal issues and boundaries today, using specific examples that have occurred recently. Also continues work on her anxiety, situational depression, and worrying and difficulty with boundary issues. Good motivation.  Setting limits with certain people is needing to be a priority for patient and she realizes this, as we worked on this a good part of session today and she seemed to feel more empowered and recognizes that she will need to practice this and know that it is okay and actually a healthy part of relationships to have limits and boundaries.  Particularly important for patient as there have been occasions in certain relationships  where boundaries were weak or not discussed/made clear, and that was unhelpful for patient.  Also emphasized that is important for the other person to know the boundaries as well so that they can exercise healthier boundaries with patient.  Patient seemed to feel more motivated on this and plans to put it into practice.  Still working on some family issues from last session where she had realized there were some situations within the family that she was not aware of and is working on this going forward.  (Not all details included in this note due to patient privacy needs).  On a positive note, patient is recognizing more that she is not holding onto anger so much as she has on some previous occasions and working further to let it go and refrain from dwelling on it.  Continues to use writing as a tool for focusing on some of her goals and goal related activity between sessions.  Discusses this later in session which she finds helpful.  Review of session at the end of our time today as this has proven to be helpful to patient especially in naming the issues she is working on just before leaving the session.  (People pleasing behaviors, healthier boundaries, decision making and trusting herself to make good decisions.)    Interventions: Cognitive Behavioral Therapy, Solution-Oriented/Positive Psychology, and Ego-Supportive 1.  Work on unresolved grief issues from the past particularly regarding her mother's death, and identify life conflicts and/or situations including from the past and the present, that support current symptomology of  anxiety/depression. 2.  Develop behavioral and cognitive strategies to reduce or eliminate excessive anxiety, depression.  Identify, challenge, and replace negative self-talk with more positive, realistic, and empowering self-talk. 3.  Patient will work on developing reality based, positive cognitive messages that can help her improve her mood and her outlook while also  working on teaching laboratory technician.   Diagnosis:   ICD-10-CM   1. Generalized anxiety disorder  F41.1      Plan:  Patient working well in session today showing good motivation and active involvement in our talking today.  Continues her work on family relationships and stressors, improved boundaries, self-esteem, as well as working to decrease her anxiety and depression, managing some grief issues and healthier ways, and setting appropriate limits with others.  Holiday grief was a bit tough at times but she feels that has not been as much of an issue more recently.  Self-esteem definitely better and continues to work on it.  Seems to feel that she stays on track with her goals even when things can happen that bump her off temporarily, and comes back to her goals.  Does want to work on reducing weight and healthy ways and making better choices in this new year.  Motivation and insight continue to improve.  Also focusing on having more appropriate personal boundaries as needed with others.  Goal review and progress/challenges noted with patient.   Next appt within 2 to 3 weeks.   Barnie Bunde, LCSW                   "

## 2024-03-07 ENCOUNTER — Other Ambulatory Visit: Payer: Self-pay

## 2024-03-07 ENCOUNTER — Other Ambulatory Visit (HOSPITAL_BASED_OUTPATIENT_CLINIC_OR_DEPARTMENT_OTHER): Payer: Self-pay

## 2024-03-07 ENCOUNTER — Ambulatory Visit (INDEPENDENT_AMBULATORY_CARE_PROVIDER_SITE_OTHER): Admitting: Family Medicine

## 2024-03-07 ENCOUNTER — Encounter (INDEPENDENT_AMBULATORY_CARE_PROVIDER_SITE_OTHER): Payer: Self-pay | Admitting: Family Medicine

## 2024-03-07 VITALS — BP 127/84 | HR 84 | Temp 97.9°F | Ht 62.0 in | Wt 214.0 lb

## 2024-03-07 DIAGNOSIS — Z7985 Long-term (current) use of injectable non-insulin antidiabetic drugs: Secondary | ICD-10-CM | POA: Diagnosis not present

## 2024-03-07 DIAGNOSIS — Z6839 Body mass index (BMI) 39.0-39.9, adult: Secondary | ICD-10-CM

## 2024-03-07 DIAGNOSIS — E559 Vitamin D deficiency, unspecified: Secondary | ICD-10-CM

## 2024-03-07 DIAGNOSIS — E119 Type 2 diabetes mellitus without complications: Secondary | ICD-10-CM

## 2024-03-07 DIAGNOSIS — E669 Obesity, unspecified: Secondary | ICD-10-CM | POA: Diagnosis not present

## 2024-03-07 DIAGNOSIS — E1169 Type 2 diabetes mellitus with other specified complication: Secondary | ICD-10-CM

## 2024-03-07 MED ORDER — MOUNJARO 12.5 MG/0.5ML ~~LOC~~ SOAJ
12.5000 mg | SUBCUTANEOUS | 0 refills | Status: AC
  Filled 2024-03-07: qty 2, 28d supply, fill #0

## 2024-03-07 NOTE — Progress Notes (Signed)
 "  Office: [phone]  /  Fax: [phone]  WEIGHT SUMMARY AND BIOMETRICS  Anthropometric Measurements Height: 5' 2 (1.575 m) Weight: 214 lb (97.1 kg) BMI (Calculated): 39.13 Weight at Last Visit: 218 lb Weight Lost Since Last Visit: 4 lb Weight Gained Since Last Visit: 0 Starting Weight: 231 lb Total Weight Loss (lbs): 17 lb (7.711 kg) Peak Weight: 235 lb   Body Composition  Body Fat %: 48.2 % Fat Mass (lbs): 103.4 lbs Muscle Mass (lbs): 105.4 lbs Total Body Water (lbs): 79 lbs Visceral Fat Rating : 16   Other Clinical Data Fasting: no Labs: no Today's Visit #: 89 Starting Date: 12/08/17    Chief Complaint: OBESITY    History of Present Illness Rhonda Aguilar is a 69 year old female with obesity and type 2 diabetes who presents for obesity treatment and progress assessment.  She has been adhering to the category two eating plan about fifty percent of the time over the last month, which included the holiday season. She engages in physical activity by walking three days a week for about fifteen minutes each session and has achieved a weight loss of four pounds over the last month.  In managing her type 2 diabetes, she is focusing on diet, exercise, and weight loss to improve her blood sugar levels. She is currently taking Mounjaro  12.5 mg. She experiences dry mouth and constipation, which she manages by drinking at least 32 ounces of water daily and using Benefiber. Additionally, she uses a mouthwash and a gel to alleviate dry mouth symptoms.  Her last recorded vitamin D  level was 39.6, and she is overdue for a recheck.  She reports feeling exhausted, which she attributes to a change in her verapamil  dosage from 300 mg to 240 mg. She has been monitoring her blood pressure, which was 157/97 in both arms, and has taken hydrochlorothiazide 25 mg to manage it. She usually takes half the dose and monitors her symptoms to avoid feeling lightheaded.  She experiences  occasional stomach pain and has noted that consuming salads two days in a row can upset her stomach. She has been consuming protein shakes and has found a new brand, Orgain, which she mixes with [employer] to extend its use. Raw vegetables tend to upset her stomach, whereas cooked vegetables like collards and Brussels sprouts do not.      PHYSICAL EXAM:  Blood pressure 127/84, pulse 84, temperature 97.9 F (36.6 C), height 5' 2 (1.575 m), weight 214 lb (97.1 kg), SpO2 95%. Body mass index is 39.14 kg/m.  DIAGNOSTIC DATA REVIEWED BY MYSELF TODAY:  BMET    Component Value Date/Time   NA 139 07/07/2023 0000   K 3.8 07/07/2023 0000   CL 104 07/07/2023 0000   CO2 28 (A) 07/07/2023 0000   GLUCOSE 118 (H) 11/26/2021 0919   GLUCOSE 192 (H) 09/05/2019 1030   BUN 17 07/07/2023 0000   CREATININE 0.9 07/07/2023 0000   CREATININE 0.75 11/26/2021 0919   CALCIUM  9.3 07/07/2023 0000   GFRNONAA 71 04/08/2020 1012   GFRAA 81 04/08/2020 1012   Lab Results  Component Value Date   HGBA1C 6.9 07/07/2023   HGBA1C 6.7 (H) 04/19/2019   Lab Results  Component Value Date   INSULIN  37.9 (H) 11/26/2021   INSULIN  38.2 (H) 12/08/2017   Lab Results  Component Value Date   TSH 2.15 01/06/2023   CBC    Component Value Date/Time   WBC 7.8 01/06/2023 0000   WBC 7.6 09/05/2019 1030  RBC 5.12 (A) 01/06/2023 0000   HGB 14.2 01/06/2023 0000   HGB 12.9 12/08/2017 1306   HCT 43 01/06/2023 0000   HCT 38.1 12/08/2017 1306   PLT 242 01/06/2023 0000   MCV 85.0 09/05/2019 1030   MCV 82 12/08/2017 1306   MCH 27.1 09/05/2019 1030   MCHC 31.9 09/05/2019 1030   RDW 15.4 09/05/2019 1030   RDW 14.0 12/08/2017 1306   Iron Studies No results found for: IRON, TIBC, FERRITIN, IRONPCTSAT Lipid Panel     Component Value Date/Time   CHOL 217 (A) 01/06/2023 0000   CHOL 195 11/26/2021 0919   TRIG 268 (A) 01/06/2023 0000   HDL 47 01/06/2023 0000   HDL 44 11/26/2021 0919   CHOLHDL 4.4 11/26/2021 0919    LDLCALC 111 (H) 11/26/2021 0919   Hepatic Function Panel     Component Value Date/Time   PROT 6.5 11/26/2021 0919   ALBUMIN 4.4 07/07/2023 0000   ALBUMIN 4.6 11/26/2021 0919   AST 44 (A) 07/07/2023 0000   ALT 77 (A) 07/07/2023 0000   ALKPHOS 71 07/07/2023 0000   BILITOT 0.4 11/26/2021 0919      Component Value Date/Time   TSH 2.15 01/06/2023 0000   TSH 2.550 12/08/2017 1306   Nutritional Lab Results  Component Value Date   VD25OH 39.6 01/06/2023   VD25OH 34.5 11/26/2021   VD25OH 42.3 02/09/2021     Assessment and Plan Assessment & Plan Obesity Managed with lifestyle modifications and pharmacotherapy. She has lost 4 pounds over the last month, including during the holidays, which is commendable. She follows the category two eating plan 50% of the time and engages in walking three days a week for 15 minutes. Mounjaro  12.5 mg is being used for weight management, with no reported issues. She experiences dry mouth and constipation, likely related to Mounjaro , and is advised to increase water intake. She is aware of the genetic component of obesity and the importance of treatment beyond diet and exercise. - Refilled Mounjaro  12.5 mg - Encouraged increased water intake to manage dry mouth and constipation - Continue category two eating plan and walking regimen  Type 2 diabetes mellitus Managed with diet, exercise, and weight loss. She is on Mounjaro  12.5 mg, which also aids in weight management. She is working on improving blood sugars through lifestyle changes. - Continue Mounjaro  12.5 mg - Encouraged continued diet and exercise regimen  Vitamin D  deficiency Managed with calcifediol  50,000 IU weekly. Her last vitamin D  level was 39.6, and she is overdue for a recheck. - Continue calcifediol  50,000 IU weekly - Ordered vitamin D  level recheck      Patients who are on anti-obesity medications are counseled on the importance of maintaining healthy lifestyle habits, including  balanced nutrition, regular physical activity, and behavioral modifications,  Medication is an adjunct to, not a replacement for, lifestyle changes and that the long-term success and weight maintenance depend on continued adherence to these strategies.   Sherene was informed of the importance of frequent follow up visits to maximize her success with intensive lifestyle modifications for her obesity and obesity related health conditions as recommended by USPSTF and CMS guidelines  Louann Penton, MD   "

## 2024-03-20 ENCOUNTER — Encounter (INDEPENDENT_AMBULATORY_CARE_PROVIDER_SITE_OTHER): Payer: Self-pay | Admitting: Family Medicine

## 2024-03-21 ENCOUNTER — Ambulatory Visit: Admitting: Psychiatry

## 2024-03-21 DIAGNOSIS — F411 Generalized anxiety disorder: Secondary | ICD-10-CM

## 2024-03-21 NOTE — Progress Notes (Signed)
"   °      [facility] Counselor/Therapist Progress Note  Patient ID: Rhonda Aguilar, MRN: [id number],    Date: 03/21/2024  Time Spent: 55 minutes   Treatment Type: Individual Therapy  Reported Symptoms: anxiety, worrying, difficulty setting limits, but not depressed  Mental Status Exam:  Appearance:   Casual and Neat     Behavior:  Appropriate, Sharing, and Motivated  Motor:  Normal  Speech/Language:   Clear and Coherent  Affect:  Anxious, worrying , difficulty setting limits for herself and others  Mood:  anxious and depressed  Thought process:  goal directed  Thought content:    WNL  Sensory/Perceptual disturbances:    WNL  Orientation:  oriented to person, place, time/date, situation, day of week, month of year, year, and stated date of Jan. 28, 2026  Attention:  Good  Concentration:  Good  Memory:  WNL  Fund of knowledge:   Good  Insight:    Good  Judgment:   Good  Impulse Control:  Good   Risk Assessment: Danger to Self:  No Self-injurious Behavior: No Danger to Others: No Duty to Warn:no Physical Aggression / Violence:No  Access to Firearms a concern: No  Gang Involvement:No   Subjective: Patient today working further on her frustrations, anxiety, some social contacts, worrying, and trying to set healthier boundaries.  Working to let go of her perfectionism. Notices that she end up happy on most days. Worries the most about possible falling and breaking a bone, and intentionally is  more careful. Focusing more on her anxiety and better managing it. Reflecting more on the positives and building upon them. Improving in her care of herself which is a real positive. Does want to start meditating in the mornings to start my day. Feels that will help her motivation. Is making progress in her mood, her self-respect and in feeling more confident. To continue her strategies as she focuses on moving forward and facing more challenges.   Interventions: Cognitive Behavioral  Therapy, Solution-Oriented/Positive Psychology, and Ego-Supportive Work on unresolved grief issues from the past, particularly regarding her mother's death, and identify life conflicts and/or situations including from the past ahd the present, that support current symptomology of anxiety/depression. 2.   Develop behavioral and cognitive strategies to reduce or eliminate excessive anxiety, depression. Identify, challenge, and replace negative self-talk with more positive, realistic, and empowering self-talk. 3.   Patient will work on developing reality based, positive cognitive messages that can help her improve   Diagnosis:   ICD-10-CM   1. Generalized anxiety disorder  F41.1      Plan:  Patient continues working well inside and outside of therapy sessions. Noticing she is feeling some better and feeling more confidence in moving forward. Weighs her thoughts often before speaking, which she finds helpful. Taking more charge of things in her world and making some better decisions in multiple areas of her life, to help herself now in working on her treatment goals and will follow her into the future.   Goal review and progress/challenges noted with patient.  Next appt within 2-3 weeks.   Barnie Bunde, LCSW                   "

## 2024-03-21 NOTE — Telephone Encounter (Signed)
 Prior authorization has been started.  Awaiting questions.  Prior authorization has been submitted to plan.  Awaiting determination.

## 2024-03-21 NOTE — Telephone Encounter (Signed)
 Your request has been approved  PA Case: [id number], Status: Approved, Coverage Starts on: 02/23/2024 12:00:00 AM, Coverage Ends on: 02/21/2025 12:00:00 AM. Questions? Contact [phone].  Patient has  been notified via my chart.

## 2024-03-22 ENCOUNTER — Other Ambulatory Visit (HOSPITAL_BASED_OUTPATIENT_CLINIC_OR_DEPARTMENT_OTHER): Payer: Self-pay

## 2024-03-23 ENCOUNTER — Other Ambulatory Visit (HOSPITAL_BASED_OUTPATIENT_CLINIC_OR_DEPARTMENT_OTHER): Payer: Self-pay

## 2024-03-30 ENCOUNTER — Other Ambulatory Visit (HOSPITAL_BASED_OUTPATIENT_CLINIC_OR_DEPARTMENT_OTHER): Payer: Self-pay

## 2024-03-30 ENCOUNTER — Other Ambulatory Visit: Payer: Self-pay

## 2024-03-30 MED ORDER — DEXLANSOPRAZOLE 60 MG PO CPDR
60.0000 mg | DELAYED_RELEASE_CAPSULE | Freq: Every day | ORAL | 4 refills | Status: AC
  Filled 2024-03-30 (×2): qty 30, 30d supply, fill #0

## 2024-04-04 ENCOUNTER — Ambulatory Visit (INDEPENDENT_AMBULATORY_CARE_PROVIDER_SITE_OTHER): Admitting: Family Medicine

## 2024-04-05 ENCOUNTER — Ambulatory Visit: Admitting: Psychiatry

## 2024-04-09 ENCOUNTER — Ambulatory Visit: Admitting: Physician Assistant

## 2024-04-19 ENCOUNTER — Ambulatory Visit: Admitting: Psychiatry

## 2024-05-02 ENCOUNTER — Ambulatory Visit (INDEPENDENT_AMBULATORY_CARE_PROVIDER_SITE_OTHER): Admitting: Family Medicine

## 2024-05-03 ENCOUNTER — Ambulatory Visit: Admitting: Psychiatry

## 2024-10-16 ENCOUNTER — Ambulatory Visit: Admitting: Adult Health
